# Patient Record
Sex: Female | Born: 1955
Health system: Southern US, Community
[De-identification: ages and names within clinical notes are randomized; demographics above are authoritative.]

## PROBLEM LIST (undated history)

## (undated) DIAGNOSIS — N19 Unspecified kidney failure: Secondary | ICD-10-CM

## (undated) DIAGNOSIS — D6859 Other primary thrombophilia: Secondary | ICD-10-CM

## (undated) DIAGNOSIS — N200 Calculus of kidney: Secondary | ICD-10-CM

## (undated) DIAGNOSIS — K5289 Other specified noninfective gastroenteritis and colitis: Secondary | ICD-10-CM

## (undated) DIAGNOSIS — F411 Generalized anxiety disorder: Secondary | ICD-10-CM

## (undated) DIAGNOSIS — K529 Noninfective gastroenteritis and colitis, unspecified: Secondary | ICD-10-CM

## (undated) DIAGNOSIS — L97909 Non-pressure chronic ulcer of unspecified part of unspecified lower leg with unspecified severity: Secondary | ICD-10-CM

## (undated) DIAGNOSIS — F17200 Nicotine dependence, unspecified, uncomplicated: Secondary | ICD-10-CM

## (undated) DIAGNOSIS — M1990 Unspecified osteoarthritis, unspecified site: Secondary | ICD-10-CM

## (undated) DIAGNOSIS — D759 Disease of blood and blood-forming organs, unspecified: Secondary | ICD-10-CM

## (undated) DIAGNOSIS — E785 Hyperlipidemia, unspecified: Secondary | ICD-10-CM

## (undated) DIAGNOSIS — F329 Major depressive disorder, single episode, unspecified: Secondary | ICD-10-CM

## (undated) DIAGNOSIS — Z8249 Family history of ischemic heart disease and other diseases of the circulatory system: Secondary | ICD-10-CM

## (undated) DIAGNOSIS — F32A Depression, unspecified: Secondary | ICD-10-CM

## (undated) DIAGNOSIS — R32 Unspecified urinary incontinence: Secondary | ICD-10-CM

## (undated) DIAGNOSIS — R071 Chest pain on breathing: Secondary | ICD-10-CM

## (undated) DIAGNOSIS — I82409 Acute embolism and thrombosis of unspecified deep veins of unspecified lower extremity: Secondary | ICD-10-CM

## (undated) DIAGNOSIS — I10 Essential (primary) hypertension: Secondary | ICD-10-CM

## (undated) DIAGNOSIS — I83009 Varicose veins of unspecified lower extremity with ulcer of unspecified site: Secondary | ICD-10-CM

## (undated) DIAGNOSIS — Z8719 Personal history of other diseases of the digestive system: Secondary | ICD-10-CM

## (undated) DIAGNOSIS — IMO0002 Reserved for concepts with insufficient information to code with codable children: Secondary | ICD-10-CM

## (undated) DIAGNOSIS — M75120 Complete rotator cuff tear or rupture of unspecified shoulder, not specified as traumatic: Secondary | ICD-10-CM

## (undated) DIAGNOSIS — J449 Chronic obstructive pulmonary disease, unspecified: Secondary | ICD-10-CM

## (undated) HISTORY — DX: Acute embolism and thrombosis of unspecified deep veins of unspecified lower extremity: I82.409

## (undated) HISTORY — DX: Chest pain on breathing: R07.1

## (undated) HISTORY — DX: Other specified noninfective gastroenteritis and colitis: K52.89

## (undated) HISTORY — DX: Essential (primary) hypertension: I10

## (undated) HISTORY — DX: Chronic obstructive pulmonary disease, unspecified: J44.9

## (undated) HISTORY — PX: NASAL SINUS SURGERY: SHX719

## (undated) HISTORY — DX: Major depressive disorder, single episode, unspecified: F32.9

## (undated) HISTORY — PX: OTHER SURGICAL HISTORY: SHX169

## (undated) HISTORY — DX: Varicose veins of unspecified lower extremity with ulcer of unspecified site: I83.009

## (undated) HISTORY — DX: Non-pressure chronic ulcer of unspecified part of unspecified lower leg with unspecified severity: L97.909

## (undated) HISTORY — DX: Reserved for concepts with insufficient information to code with codable children: IMO0002

## (undated) HISTORY — DX: Family history of ischemic heart disease and other diseases of the circulatory system: Z82.49

## (undated) HISTORY — DX: Unspecified urinary incontinence: R32

## (undated) HISTORY — DX: Personal history of other diseases of the digestive system: Z87.19

## (undated) HISTORY — DX: Complete rotator cuff tear or rupture of unspecified shoulder, not specified as traumatic: M75.120

## (undated) HISTORY — PX: TUBAL LIGATION: SHX77

## (undated) HISTORY — DX: Hyperlipidemia, unspecified: E78.5

## (undated) HISTORY — DX: Noninfective gastroenteritis and colitis, unspecified: K52.9

## (undated) HISTORY — PX: ROTATOR CUFF REPAIR: SHX139

## (undated) HISTORY — DX: Depression, unspecified: F32.A

## (undated) HISTORY — DX: Unspecified kidney failure: N19

## (undated) HISTORY — PX: THROMBECTOMY / EMBOLECTOMY FEMORAL ARTERY: SUR1353

## (undated) HISTORY — DX: Nicotine dependence, unspecified, uncomplicated: F17.200

## (undated) HISTORY — DX: Calculus of kidney: N20.0

## (undated) HISTORY — DX: Other primary thrombophilia: D68.59

## (undated) HISTORY — DX: Generalized anxiety disorder: F41.1

## (undated) HISTORY — DX: Unspecified osteoarthritis, unspecified site: M19.90

---

## 2003-09-14 ENCOUNTER — Encounter: Payer: Self-pay | Admitting: Family Medicine

## 2003-09-14 LAB — CONVERTED CEMR LAB: Pap Smear: NORMAL

## 2004-04-30 ENCOUNTER — Emergency Department (HOSPITAL_COMMUNITY): Admission: EM | Admit: 2004-04-30 | Discharge: 2004-05-02 | Payer: Self-pay | Admitting: Emergency Medicine

## 2004-09-03 ENCOUNTER — Ambulatory Visit (HOSPITAL_COMMUNITY): Admission: RE | Admit: 2004-09-03 | Discharge: 2004-09-03 | Payer: Self-pay | Admitting: Family Medicine

## 2005-12-08 ENCOUNTER — Emergency Department (HOSPITAL_COMMUNITY): Admission: EM | Admit: 2005-12-08 | Discharge: 2005-12-08 | Payer: Self-pay | Admitting: Emergency Medicine

## 2005-12-14 ENCOUNTER — Emergency Department (HOSPITAL_COMMUNITY): Admission: EM | Admit: 2005-12-14 | Discharge: 2005-12-14 | Payer: Self-pay | Admitting: Emergency Medicine

## 2006-05-20 ENCOUNTER — Emergency Department (HOSPITAL_COMMUNITY): Admission: EM | Admit: 2006-05-20 | Discharge: 2006-05-20 | Payer: Self-pay | Admitting: *Deleted

## 2006-05-20 ENCOUNTER — Emergency Department (HOSPITAL_COMMUNITY): Admission: EM | Admit: 2006-05-20 | Discharge: 2006-05-20 | Payer: Self-pay | Admitting: Emergency Medicine

## 2006-06-17 ENCOUNTER — Emergency Department (HOSPITAL_COMMUNITY): Admission: EM | Admit: 2006-06-17 | Discharge: 2006-06-17 | Payer: Self-pay | Admitting: Emergency Medicine

## 2006-11-06 ENCOUNTER — Emergency Department (HOSPITAL_COMMUNITY): Admission: EM | Admit: 2006-11-06 | Discharge: 2006-11-06 | Payer: Self-pay | Admitting: Emergency Medicine

## 2006-12-22 ENCOUNTER — Ambulatory Visit: Payer: Self-pay | Admitting: Family Medicine

## 2007-01-18 ENCOUNTER — Telehealth (INDEPENDENT_AMBULATORY_CARE_PROVIDER_SITE_OTHER): Payer: Self-pay | Admitting: *Deleted

## 2007-01-18 DIAGNOSIS — D6859 Other primary thrombophilia: Secondary | ICD-10-CM

## 2007-01-19 ENCOUNTER — Ambulatory Visit: Payer: Self-pay | Admitting: Oncology

## 2007-01-24 ENCOUNTER — Ambulatory Visit: Payer: Self-pay | Admitting: Family Medicine

## 2007-01-24 DIAGNOSIS — E7800 Pure hypercholesterolemia, unspecified: Secondary | ICD-10-CM

## 2007-01-25 LAB — CONVERTED CEMR LAB
ALT: 36 units/L (ref 0–40)
Alkaline Phosphatase: 69 units/L (ref 39–117)
BUN: 9 mg/dL (ref 6–23)
CO2: 31 meq/L (ref 19–32)
Calcium: 9.6 mg/dL (ref 8.4–10.5)
Chloride: 105 meq/L (ref 96–112)
Creatinine, Ser: 0.9 mg/dL (ref 0.4–1.2)
GFR calc non Af Amer: 70 mL/min
HDL: 41 mg/dL (ref >39.0)
Sodium: 142 meq/L (ref 135–145)
Total Bilirubin: 0.9 mg/dL (ref 0.3–1.2)
Total Protein: 7.1 g/dL (ref 6.0–8.3)
Triglycerides: 67 mg/dL (ref 0–149)
VLDL: 13 mg/dL (ref 0–40)

## 2007-02-02 LAB — CBC WITH DIFFERENTIAL (CANCER CENTER ONLY)
BASO#: 0.1 10*3/uL (ref 0.0–0.2)
BASO%: 0.6 % (ref 0.0–2.0)
Eosinophils Absolute: 0.2 10*3/uL (ref 0.0–0.5)
HCT: 42.1 % (ref 34.8–46.6)
HGB: 14.7 g/dL (ref 11.6–15.9)
LYMPH#: 2.8 10*3/uL (ref 0.9–3.3)
LYMPH%: 36.7 % (ref 14.0–48.0)
MCV: 91 fL (ref 81–101)
MONO#: 0.4 10*3/uL (ref 0.1–0.9)
NEUT%: 54.8 % (ref 39.6–80.0)
RBC: 4.61 10*6/uL (ref 3.70–5.32)
WBC: 7.6 10*3/uL (ref 3.9–10.0)

## 2007-02-02 LAB — PROTIME-INR (CHCC SATELLITE)

## 2007-02-08 ENCOUNTER — Encounter: Admission: RE | Admit: 2007-02-08 | Discharge: 2007-02-08 | Payer: Self-pay | Admitting: Family Medicine

## 2007-02-08 LAB — COMPREHENSIVE METABOLIC PANEL
ALT: 31 U/L (ref 0–35)
CO2: 28 mEq/L (ref 19–32)
Calcium: 9.4 mg/dL (ref 8.4–10.5)
Chloride: 102 mEq/L (ref 96–112)
Sodium: 139 mEq/L (ref 135–145)
Total Protein: 7 g/dL (ref 6.0–8.3)

## 2007-02-08 LAB — HYPERCOAGULABLE PANEL, COMPREHENSIVE
AntiThromb III Func: 92 % (ref 75–120)
Beta-2 Glyco I IgG: <4 U/mL (ref <20)
Beta-2-Glycoprotein I IgA: <4 U/mL (ref <10)
Beta-2-Glycoprotein I IgM: <4 U/mL (ref <10)
Homocysteine: 10.9 umol/L (ref 4.0–15.4)
Protein S Activity: 25 % — ABNORMAL LOW (ref 81–180)
Protein S Ag, Total: 68 % — ABNORMAL LOW (ref 70–140)

## 2007-03-01 ENCOUNTER — Encounter: Payer: Self-pay | Admitting: Family Medicine

## 2007-03-01 LAB — PROTIME-INR (CHCC SATELLITE)
INR: 1 — ABNORMAL LOW (ref 2.0–3.5)
Protime: 12 Seconds (ref 10.6–13.4)

## 2007-03-03 ENCOUNTER — Ambulatory Visit: Payer: Self-pay | Admitting: Oncology

## 2007-03-13 LAB — PROTIME-INR (CHCC SATELLITE): Protime: 39.6 Seconds — ABNORMAL HIGH (ref 10.6–13.4)

## 2007-03-21 LAB — PROTIME-INR (CHCC SATELLITE)
INR: 2.7 (ref 2.0–3.5)
Protime: 32.4 Seconds — ABNORMAL HIGH (ref 10.6–13.4)

## 2007-03-27 LAB — PROTIME-INR (CHCC SATELLITE): INR: 1.9 — ABNORMAL LOW (ref 2.0–3.5)

## 2007-04-03 LAB — PROTIME-INR (CHCC SATELLITE)

## 2007-05-03 ENCOUNTER — Ambulatory Visit: Payer: Self-pay | Admitting: Oncology

## 2007-05-04 ENCOUNTER — Telehealth (INDEPENDENT_AMBULATORY_CARE_PROVIDER_SITE_OTHER): Payer: Self-pay | Admitting: *Deleted

## 2007-05-04 LAB — PROTIME-INR (CHCC SATELLITE)

## 2007-05-30 ENCOUNTER — Telehealth: Payer: Self-pay | Admitting: Family Medicine

## 2007-05-30 DIAGNOSIS — F418 Other specified anxiety disorders: Secondary | ICD-10-CM

## 2007-05-31 LAB — CBC WITH DIFFERENTIAL (CANCER CENTER ONLY)
BASO%: 0.8 % (ref 0.0–2.0)
Eosinophils Absolute: 0.2 10*3/uL (ref 0.0–0.5)
LYMPH#: 2.6 10*3/uL (ref 0.9–3.3)
MCV: 94 fL (ref 81–101)
MONO#: 0.7 10*3/uL (ref 0.1–0.9)
Platelets: 237 10*3/uL (ref 145–400)
RBC: 4.89 10*6/uL (ref 3.70–5.32)
RDW: 12.2 % (ref 10.5–14.6)
WBC: 8.3 10*3/uL (ref 3.9–10.0)

## 2007-05-31 LAB — PROTIME-INR (CHCC SATELLITE): Protime: 12 Seconds (ref 10.6–13.4)

## 2007-06-01 ENCOUNTER — Telehealth (INDEPENDENT_AMBULATORY_CARE_PROVIDER_SITE_OTHER): Payer: Self-pay | Admitting: *Deleted

## 2007-06-14 ENCOUNTER — Telehealth: Payer: Self-pay | Admitting: Family Medicine

## 2007-06-14 LAB — PROTIME-INR (CHCC SATELLITE): Protime: 13.2 Seconds (ref 10.6–13.4)

## 2007-06-20 ENCOUNTER — Ambulatory Visit: Payer: Self-pay | Admitting: Oncology

## 2007-06-21 LAB — PROTIME-INR (CHCC SATELLITE)
INR: 2.2 (ref 2.0–3.5)
Protime: 26.4 Seconds — ABNORMAL HIGH (ref 10.6–13.4)

## 2007-06-26 ENCOUNTER — Ambulatory Visit: Payer: Self-pay | Admitting: Family Medicine

## 2007-06-26 DIAGNOSIS — I10 Essential (primary) hypertension: Secondary | ICD-10-CM

## 2007-06-26 DIAGNOSIS — K5289 Other specified noninfective gastroenteritis and colitis: Secondary | ICD-10-CM

## 2007-06-26 LAB — CONVERTED CEMR LAB
Bilirubin Urine: NEGATIVE
Glucose, Urine, Semiquant: NEGATIVE
Ketones, urine, test strip: NEGATIVE
Nitrite: NEGATIVE
Protein, U semiquant: NEGATIVE
Urobilinogen, UA: NEGATIVE
pH: 6

## 2007-06-29 ENCOUNTER — Encounter: Payer: Self-pay | Admitting: Family Medicine

## 2007-06-29 LAB — CBC WITH DIFFERENTIAL (CANCER CENTER ONLY)
BASO#: 0.1 10*3/uL (ref 0.0–0.2)
EOS%: 2.4 % (ref 0.0–7.0)
HCT: 45 % (ref 34.8–46.6)
HGB: 15.8 g/dL (ref 11.6–15.9)
LYMPH#: 3.9 10*3/uL — ABNORMAL HIGH (ref 0.9–3.3)
LYMPH%: 38.8 % (ref 14.0–48.0)
MCH: 33.3 pg (ref 26.0–34.0)
MCHC: 35.1 g/dL (ref 32.0–36.0)
MCV: 95 fL (ref 81–101)
NEUT%: 52.1 % (ref 39.6–80.0)

## 2007-06-29 LAB — PROTIME-INR (CHCC SATELLITE)
INR: 3.5 (ref 2.0–3.5)
Protime: 42 Seconds — ABNORMAL HIGH (ref 10.6–13.4)

## 2007-07-04 ENCOUNTER — Ambulatory Visit: Payer: Self-pay | Admitting: Family Medicine

## 2007-07-06 LAB — CONVERTED CEMR LAB
AST: 35 units/L (ref 0–37)
Albumin: 4.2 g/dL (ref 3.5–5.2)
Alkaline Phosphatase: 75 units/L (ref 39–117)
Bilirubin, Direct: 0.1 mg/dL (ref 0.0–0.3)
Chloride: 103 meq/L (ref 96–112)
GFR calc Af Amer: 98 mL/min
GFR calc non Af Amer: 81 mL/min
Hemoglobin: 15.2 g/dL — ABNORMAL HIGH (ref 12.0–15.0)
Neutrophils Relative %: 69.3 % (ref 43.0–77.0)
RBC: 4.47 M/uL (ref 3.87–5.11)
TSH: 1.56 microintl units/mL (ref 0.35–5.50)
Total Bilirubin: 0.9 mg/dL (ref 0.3–1.2)
Total Protein: 7.1 g/dL (ref 6.0–8.3)
WBC: 9.6 10*3/uL (ref 4.5–10.5)

## 2007-07-06 LAB — PROTIME-INR (CHCC SATELLITE): INR: 1.6 — ABNORMAL LOW (ref 2.0–3.5)

## 2007-07-13 LAB — PROTIME-INR (CHCC SATELLITE)

## 2007-07-17 ENCOUNTER — Telehealth: Payer: Self-pay | Admitting: Family Medicine

## 2007-08-09 ENCOUNTER — Ambulatory Visit: Payer: Self-pay | Admitting: Oncology

## 2007-08-16 LAB — PROTIME-INR (CHCC SATELLITE): Protime: 24 Seconds — ABNORMAL HIGH (ref 10.6–13.4)

## 2007-09-08 ENCOUNTER — Emergency Department: Payer: Self-pay | Admitting: Unknown Physician Specialty

## 2007-09-14 LAB — HM MAMMOGRAPHY: HM Mammogram: NORMAL

## 2007-11-26 ENCOUNTER — Emergency Department (HOSPITAL_COMMUNITY): Admission: EM | Admit: 2007-11-26 | Discharge: 2007-11-26 | Payer: Self-pay | Admitting: Emergency Medicine

## 2007-12-06 ENCOUNTER — Encounter: Admission: RE | Admit: 2007-12-06 | Discharge: 2007-12-06 | Payer: Self-pay | Admitting: Family Medicine

## 2007-12-06 ENCOUNTER — Ambulatory Visit: Payer: Self-pay | Admitting: Family Medicine

## 2007-12-06 DIAGNOSIS — R071 Chest pain on breathing: Secondary | ICD-10-CM

## 2007-12-27 ENCOUNTER — Ambulatory Visit: Payer: Self-pay | Admitting: Oncology

## 2007-12-29 ENCOUNTER — Encounter: Payer: Self-pay | Admitting: Family Medicine

## 2007-12-29 DIAGNOSIS — I82403 Acute embolism and thrombosis of unspecified deep veins of lower extremity, bilateral: Secondary | ICD-10-CM

## 2007-12-29 DIAGNOSIS — IMO0002 Reserved for concepts with insufficient information to code with codable children: Secondary | ICD-10-CM

## 2007-12-29 DIAGNOSIS — F17200 Nicotine dependence, unspecified, uncomplicated: Secondary | ICD-10-CM | POA: Insufficient documentation

## 2007-12-29 DIAGNOSIS — N200 Calculus of kidney: Secondary | ICD-10-CM

## 2007-12-29 DIAGNOSIS — R32 Unspecified urinary incontinence: Secondary | ICD-10-CM

## 2007-12-29 DIAGNOSIS — M75120 Complete rotator cuff tear or rupture of unspecified shoulder, not specified as traumatic: Secondary | ICD-10-CM

## 2008-01-12 ENCOUNTER — Telehealth: Payer: Self-pay | Admitting: Family Medicine

## 2008-01-23 ENCOUNTER — Telehealth (INDEPENDENT_AMBULATORY_CARE_PROVIDER_SITE_OTHER): Payer: Self-pay | Admitting: *Deleted

## 2008-01-24 ENCOUNTER — Encounter (INDEPENDENT_AMBULATORY_CARE_PROVIDER_SITE_OTHER): Payer: Self-pay | Admitting: *Deleted

## 2008-02-09 ENCOUNTER — Telehealth: Payer: Self-pay | Admitting: Family Medicine

## 2008-10-19 ENCOUNTER — Emergency Department (HOSPITAL_COMMUNITY): Admission: EM | Admit: 2008-10-19 | Discharge: 2008-10-20 | Payer: Self-pay | Admitting: Emergency Medicine

## 2009-02-04 ENCOUNTER — Emergency Department (HOSPITAL_COMMUNITY): Admission: EM | Admit: 2009-02-04 | Discharge: 2009-02-04 | Payer: Self-pay | Admitting: Emergency Medicine

## 2009-02-20 ENCOUNTER — Emergency Department (HOSPITAL_COMMUNITY): Admission: EM | Admit: 2009-02-20 | Discharge: 2009-02-20 | Payer: Self-pay | Admitting: Emergency Medicine

## 2010-01-11 ENCOUNTER — Emergency Department (HOSPITAL_COMMUNITY)
Admission: EM | Admit: 2010-01-11 | Discharge: 2010-01-11 | Payer: Self-pay | Source: Home / Self Care | Admitting: Emergency Medicine

## 2010-01-11 ENCOUNTER — Ambulatory Visit: Payer: Self-pay | Admitting: Vascular Surgery

## 2010-03-15 ENCOUNTER — Emergency Department (HOSPITAL_COMMUNITY)
Admission: EM | Admit: 2010-03-15 | Discharge: 2010-03-15 | Payer: Self-pay | Source: Home / Self Care | Admitting: Emergency Medicine

## 2010-03-15 ENCOUNTER — Encounter (INDEPENDENT_AMBULATORY_CARE_PROVIDER_SITE_OTHER): Payer: Self-pay | Admitting: Emergency Medicine

## 2010-03-15 ENCOUNTER — Ambulatory Visit: Payer: Self-pay | Admitting: Vascular Surgery

## 2010-03-19 ENCOUNTER — Encounter (HOSPITAL_BASED_OUTPATIENT_CLINIC_OR_DEPARTMENT_OTHER)
Admission: RE | Admit: 2010-03-19 | Discharge: 2010-06-17 | Payer: Self-pay | Source: Home / Self Care | Admitting: Internal Medicine

## 2010-04-09 ENCOUNTER — Ambulatory Visit: Payer: Self-pay | Admitting: Vascular Surgery

## 2010-04-27 ENCOUNTER — Ambulatory Visit: Payer: Self-pay | Admitting: Internal Medicine

## 2010-04-30 ENCOUNTER — Ambulatory Visit: Payer: Self-pay | Admitting: Cardiovascular Disease

## 2010-04-30 LAB — CONVERTED CEMR LAB: POC INR: 1.5

## 2010-05-11 ENCOUNTER — Ambulatory Visit: Payer: Self-pay | Admitting: Internal Medicine

## 2010-05-26 ENCOUNTER — Ambulatory Visit: Payer: Self-pay | Admitting: Cardiovascular Disease

## 2010-05-26 LAB — CONVERTED CEMR LAB

## 2010-06-10 ENCOUNTER — Ambulatory Visit: Payer: Self-pay | Admitting: Internal Medicine

## 2010-06-10 LAB — CONVERTED CEMR LAB: POC INR: 2.5

## 2010-06-17 ENCOUNTER — Encounter: Payer: Self-pay | Admitting: Internal Medicine

## 2010-06-17 ENCOUNTER — Encounter (HOSPITAL_BASED_OUTPATIENT_CLINIC_OR_DEPARTMENT_OTHER)
Admission: RE | Admit: 2010-06-17 | Discharge: 2010-09-15 | Payer: Self-pay | Source: Home / Self Care | Attending: Internal Medicine | Admitting: Internal Medicine

## 2010-06-22 ENCOUNTER — Telehealth (INDEPENDENT_AMBULATORY_CARE_PROVIDER_SITE_OTHER): Payer: Self-pay | Admitting: *Deleted

## 2010-06-25 ENCOUNTER — Ambulatory Visit: Payer: Self-pay | Admitting: Internal Medicine

## 2010-06-25 LAB — CONVERTED CEMR LAB: POC INR: 2.9

## 2010-07-15 ENCOUNTER — Telehealth (INDEPENDENT_AMBULATORY_CARE_PROVIDER_SITE_OTHER): Payer: Self-pay | Admitting: *Deleted

## 2010-09-16 ENCOUNTER — Encounter (HOSPITAL_BASED_OUTPATIENT_CLINIC_OR_DEPARTMENT_OTHER)
Admission: RE | Admit: 2010-09-16 | Discharge: 2010-10-13 | Payer: Self-pay | Source: Home / Self Care | Attending: Internal Medicine | Admitting: Internal Medicine

## 2010-09-24 ENCOUNTER — Ambulatory Visit
Admission: RE | Admit: 2010-09-24 | Discharge: 2010-09-24 | Payer: Self-pay | Source: Home / Self Care | Attending: Internal Medicine | Admitting: Internal Medicine

## 2010-09-24 ENCOUNTER — Other Ambulatory Visit: Payer: Self-pay | Admitting: Internal Medicine

## 2010-09-24 DIAGNOSIS — L723 Sebaceous cyst: Secondary | ICD-10-CM | POA: Insufficient documentation

## 2010-09-24 DIAGNOSIS — M542 Cervicalgia: Secondary | ICD-10-CM | POA: Insufficient documentation

## 2010-09-24 LAB — CBC WITH DIFFERENTIAL/PLATELET
Basophils Absolute: 0.1 10*3/uL (ref 0.0–0.1)
Basophils Relative: 1 % (ref 0.0–3.0)
Eosinophils Absolute: 0.3 10*3/uL (ref 0.0–0.7)
Eosinophils Relative: 3.1 % (ref 0.0–5.0)
HCT: 44.8 % (ref 36.0–46.0)
Hemoglobin: 15.6 g/dL — ABNORMAL HIGH (ref 12.0–15.0)
Lymphocytes Relative: 34.5 % (ref 12.0–46.0)
Lymphs Abs: 3.5 10*3/uL (ref 0.7–4.0)
MCHC: 34.8 g/dL (ref 30.0–36.0)
MCV: 94.2 fl (ref 78.0–100.0)
Monocytes Absolute: 0.1 10*3/uL (ref 0.1–1.0)
Monocytes Relative: 0.8 % — ABNORMAL LOW (ref 3.0–12.0)
Neutro Abs: 6.2 10*3/uL (ref 1.4–7.7)
Neutrophils Relative %: 60.6 % (ref 43.0–77.0)
Platelets: 236 10*3/uL (ref 150.0–400.0)
RBC: 4.76 Mil/uL (ref 3.87–5.11)
RDW: 12.7 % (ref 11.5–14.6)
WBC: 10.2 10*3/uL (ref 4.5–10.5)

## 2010-09-24 LAB — BASIC METABOLIC PANEL
BUN: 10 mg/dL (ref 6–23)
CO2: 27 mEq/L (ref 19–32)
Calcium: 8.9 mg/dL (ref 8.4–10.5)
Chloride: 103 mEq/L (ref 96–112)
Creatinine, Ser: 0.7 mg/dL (ref 0.4–1.2)
GFR: 92.64 mL/min (ref >60.00)
Glucose, Bld: 94 mg/dL (ref 70–99)
Potassium: 4.1 mEq/L (ref 3.5–5.1)
Sodium: 140 mEq/L (ref 135–145)

## 2010-09-24 LAB — PROTIME-INR
INR: 1 ratio (ref 0.8–1.0)
Prothrombin Time: 11 s (ref 10.2–12.4)

## 2010-09-25 ENCOUNTER — Ambulatory Visit: Admit: 2010-09-25 | Payer: Self-pay

## 2010-09-25 ENCOUNTER — Telehealth: Payer: Self-pay | Admitting: Internal Medicine

## 2010-09-25 ENCOUNTER — Encounter: Payer: Self-pay | Admitting: Internal Medicine

## 2010-09-29 ENCOUNTER — Telehealth (INDEPENDENT_AMBULATORY_CARE_PROVIDER_SITE_OTHER): Payer: Self-pay | Admitting: *Deleted

## 2010-09-30 ENCOUNTER — Telehealth (INDEPENDENT_AMBULATORY_CARE_PROVIDER_SITE_OTHER): Payer: Self-pay | Admitting: *Deleted

## 2010-10-01 ENCOUNTER — Telehealth: Payer: Self-pay | Admitting: Internal Medicine

## 2010-10-01 ENCOUNTER — Ambulatory Visit
Admission: RE | Admit: 2010-10-01 | Discharge: 2010-10-01 | Payer: Self-pay | Source: Home / Self Care | Attending: Internal Medicine | Admitting: Internal Medicine

## 2010-10-01 ENCOUNTER — Other Ambulatory Visit: Payer: Self-pay | Admitting: Internal Medicine

## 2010-10-01 DIAGNOSIS — M549 Dorsalgia, unspecified: Secondary | ICD-10-CM | POA: Insufficient documentation

## 2010-10-01 DIAGNOSIS — N390 Urinary tract infection, site not specified: Secondary | ICD-10-CM | POA: Insufficient documentation

## 2010-10-01 LAB — URINALYSIS, ROUTINE W REFLEX MICROSCOPIC
Hemoglobin, Urine: NEGATIVE
Leukocytes, UA: NEGATIVE
Nitrite: NEGATIVE
Specific Gravity, Urine: >=1.03 (ref 1.000–1.030)
Urine Glucose: NEGATIVE
Urobilinogen, UA: 1 (ref 0.0–1.0)
pH: 5.5 (ref 5.0–8.0)

## 2010-10-02 ENCOUNTER — Encounter: Payer: Self-pay | Admitting: Internal Medicine

## 2010-10-04 ENCOUNTER — Encounter: Payer: Self-pay | Admitting: Family Medicine

## 2010-10-05 ENCOUNTER — Encounter (INDEPENDENT_AMBULATORY_CARE_PROVIDER_SITE_OTHER): Payer: Self-pay | Admitting: Pharmacist

## 2010-10-08 ENCOUNTER — Ambulatory Visit: Payer: Self-pay | Admitting: Cardiology

## 2010-10-12 ENCOUNTER — Telehealth: Payer: Self-pay | Admitting: Internal Medicine

## 2010-10-13 NOTE — Medication Information (Signed)
Summary: ccr  Anticoagulant Therapy  Managed by: Cloyde Reams, RN, BSN Referring MD: Dr. Yetta Barre PCP: Etta Grandchild MD Supervising MD: Clifton James MD, Cristal Deer Indication 1: DVT Lab Used: [facility] [place] Site: [address] INR POC 1.2 INR RANGE 2.0-3.0  Dietary changes: no    Health status changes: no    Bleeding/hemorrhagic complications: no    Recent/future hospitalizations: no    Any changes in medication regimen? no    Recent/future dental: no  Any missed doses?: no       Is patient compliant with meds? yes       Allergies: 1)  ! * Naproxen 2)  ! Fioricet (Butalbital-Apap-Caffeine)  Anticoagulation Management History:      The patient is taking warfarin and comes in today for a routine follow up visit.  Negative risk factors for bleeding include an age less than 64 years old.  The bleeding index is 'low risk'.  Positive CHADS2 values include History of HTN.  Negative CHADS2 values include Age > 36 years old.  Anticoagulation responsible provider: Clifton James MD, Cristal Deer.  INR POC: 1.2.  Cuvette Lot#: [id number].  Exp: 07/2011.    Anticoagulation Management Assessment/Plan:      The patient's current anticoagulation dose is Coumadin 7.5 mg tabs: One by mouth once daily.  The target INR is 2.0-3.0.  The next INR is due 06/04/2010.  Results were reviewed/authorized by Cloyde Reams, RN, BSN.  She was notified by Cloyde Reams, RN, BSN.         Prior Anticoagulation Instructions: INR 1.5 Coumadin 7.5mg  each day  Current Anticoagulation Instructions: INR 1.2  Take 1 additional tablet today, then start taking 1 tablet daily except 1.5 tablets on Wednesdays and Saturdays.  Recheck in 1 week.

## 2010-10-13 NOTE — Letter (Signed)
Summary: [facility] Referral (unable to schedule) Notification  [facility]  [address] [place], [place] [address]   Phone: [phone]  Fax: [phone]      June 17, 2010 Brooke Garcia [facility] 1956-07-05 MRN: [id number]   [facility] [address] [place], [place]  [address]   Dear Dr. Yetta Barre:   Thank you for your kind referral of the above patient. We have attempted to schedule the recommended Colonoscopy but have been unable to schedule because:  __ The patient was not available by phone and/or has not returned our calls.  _x_ The patient declined to schedule the procedure at this time. Does not have insurance at this time.  We appreciate the referral and hope that we will have the opportunity to treat this patient in the future.    Sincerely,   [facility]  Vania Rea. Jarold Motto M.D. Hedwig Morton. Juanda Chance M.D. Venita Lick. Russella Dar M.D. Wilhemina Bonito. Marina Goodell M.D. Barbette Hair. Arlyce Dice M.D. Iva Boop M.D. Cheron Every.D.

## 2010-10-13 NOTE — Medication Information (Signed)
Summary: rov/mw  Anticoagulant Therapy  Managed by: Weston Brass, PharmD Referring MD: Dr. Yetta Barre PCP: Etta Grandchild MD Supervising MD: Ladona Ridgel MD,Laurice Iglesia Indication 1: DVT Lab Used: [facility] [place] Site: [address] INR POC 2.9 INR RANGE 2.0-3.0  Dietary changes: no    Health status changes: no    Bleeding/hemorrhagic complications: no    Recent/future hospitalizations: no    Any changes in medication regimen? no    Recent/future dental: no  Any missed doses?: yes     Details: Missed dose on 06/20/10.    Is patient compliant with meds? yes       Allergies: 1)  ! * Naproxen 2)  ! Fioricet (Butalbital-Apap-Caffeine)  Anticoagulation Management History:      The patient is taking warfarin and comes in today for a routine follow up visit.  Negative risk factors for bleeding include an age less than 2 years old.  The bleeding index is 'low risk'.  Positive CHADS2 values include History of HTN.  Negative CHADS2 values include Age > 20 years old.  Anticoagulation responsible provider: Ladona Ridgel MD,Nolton Denis.  INR POC: 2.9.  Cuvette Lot#: [id number].  Exp: 07/2011.    Anticoagulation Management Assessment/Plan:      The patient's current anticoagulation dose is Coumadin 7.5 mg tabs: One by mouth once daily.  The target INR is 2.0-3.0.  The next INR is due 07/23/2010.  Results were reviewed/authorized by Weston Brass, PharmD.  She was notified by Haynes Hoehn, PharmD Candidate.         Prior Anticoagulation Instructions: INR 2.5  Continue taking 1.5 tablets on wednesday and saturday. And 1 tablet all other days. Recheck 1 week.  Current Anticoagulation Instructions: INR 2.9  Continue Coumadin as scheduled:  1 tablet every day of the week, except 1 and 1/2 tablets on Wednesday and Saturday.    Return to clinic in 4 weeks.

## 2010-10-13 NOTE — Medication Information (Signed)
Summary: rov/ewj  Anticoagulant Therapy  Managed by: Lyna Poser, PharmD Referring MD: Dr. Yetta Barre PCP: Etta Grandchild MD Supervising MD: Ladona Ridgel MD,Gregg Indication 1: DVT Lab Used: [facility] [place] Site: [address] INR POC 2.5 INR RANGE 2.0-3.0  Dietary changes: no    Health status changes: no    Bleeding/hemorrhagic complications: no    Recent/future hospitalizations: no    Any changes in medication regimen? no    Recent/future dental: no  Any missed doses?: no       Is patient compliant with meds? yes       Allergies: 1)  ! * Naproxen 2)  ! Fioricet (Butalbital-Apap-Caffeine)  Anticoagulation Management History:      The patient is taking warfarin and comes in today for a routine follow up visit.  Negative risk factors for bleeding include an age less than 76 years old.  The bleeding index is 'low risk'.  Positive CHADS2 values include History of HTN.  Negative CHADS2 values include Age > 31 years old.  Anticoagulation responsible Rateel Beldin: Ladona Ridgel MD,Gregg.  INR POC: 2.5.  Cuvette Lot#: [id number].  Exp: 07/2011.    Anticoagulation Management Assessment/Plan:      The patient's current anticoagulation dose is Coumadin 7.5 mg tabs: One by mouth once daily.  The target INR is 2.0-3.0.  The next INR is due 06/18/2010.  Results were reviewed/authorized by Lyna Poser, PharmD.         Prior Anticoagulation Instructions: INR 1.2  Take 1 additional tablet today, then start taking 1 tablet daily except 1.5 tablets on Wednesdays and Saturdays.  Recheck in 1 week.  Current Anticoagulation Instructions: INR 2.5  Continue taking 1.5 tablets on wednesday and saturday. And 1 tablet all other days. Recheck 1 week.

## 2010-10-13 NOTE — Progress Notes (Signed)
  Phone Note Other Incoming   Request: Send information Summary of Call: Request for records received from [facility]. Request forwarded to [employer].      Appended Document:  [facility] request recieved sent to [employer]   Appended Document:  [facility] Request received sent to [facility]

## 2010-10-13 NOTE — Assessment & Plan Note (Signed)
Summary: 2 week follow up-lb   Vital Signs:  Patient profile:   55 year old female Height:      63.5 inches Weight:      182 pounds BMI:     31.85 O2 Sat:      96 % on Room air Temp:     98.0 degrees F oral Pulse rate:   80 / minute Pulse rhythm:   regular Resp:     16 per minute BP sitting:   122 / 80  (left arm) Cuff size:   regular  Vitals Entered By: Bill Salinas CMA (May 11, 2010 2:54 PM)  Nutrition Counseling: Patient's BMI is greater than 25 and therefore counseled on weight management options.  O2 Flow:  Room air CC: follow-up visit/ ab   Primary Care Tawnia Schirm:  Etta Grandchild MD  CC:  follow-up visit/ ab.  History of Present Illness: She returns for f/up and is doing well on coumadin and she continues to go to the [facility] 2x per week and she says that her ulcers are not infected anymore.  Preventive Screening-Counseling & Management  Alcohol-Tobacco     Alcohol drinks/day: 0     Smoking Status: current     Smoking Cessation Counseling: yes     Smoke Cessation Stage: contemplative     Packs/Day: 1.0     Year Started: 1975     Pack years: 45     Tobacco Counseling: to quit use of tobacco products  Hep-HIV-STD-Contraception     Hepatitis Risk: no risk noted     HIV Risk: no risk noted     STD Risk: no risk noted      Drug Use:  never.        Blood Transfusions:  no.    Medications Prior to Update: 1)  Amlodipine Besylate 10 Mg  Tabs (Amlodipine Besylate) .... Take 1 Tablet By Mouth Once A Day 2)  Multivitamins   Tabs (Multiple Vitamin) .... Take 1 Tablet By Mouth Once A Day 3)  Alprazolam 1 Mg  Tbdp (Alprazolam) .Marland Kitchen.. 1 Tab By Mouth Tid 4)  Doxycycline Hyclate 100 Mg Tabs (Doxycycline Hyclate) .... Take 1 Tablet By Mouth Two Times A Day 5)  Vicodin 5-500 Mg Tabs (Hydrocodone-Acetaminophen) .... Every 4-6 Hours As Needed 6)  Coumadin 7.5 Mg Tabs (Warfarin Sodium) .... One By Mouth Once Daily  Current Medications (verified): 1)  Amlodipine Besylate 10  Mg  Tabs (Amlodipine Besylate) .... Take 1 Tablet By Mouth Once A Day 2)  Multivitamins   Tabs (Multiple Vitamin) .... Take 1 Tablet By Mouth Once A Day 3)  Alprazolam 1 Mg  Tbdp (Alprazolam) .Marland Kitchen.. 1 Tab By Mouth Tid 4)  Vicodin 5-500 Mg Tabs (Hydrocodone-Acetaminophen) .... Every 4-6 Hours As Needed 5)  Coumadin 7.5 Mg Tabs (Warfarin Sodium) .... One By Mouth Once Daily  Allergies (verified): 1)  ! * Naproxen 2)  ! Fioricet (Butalbital-Apap-Caffeine)  Past History:  Past Medical History: Last updated: 12/29/2007 TOBACCO ABUSE (ICD-305.1) URINARY INCONTINENCE (ICD-788.30) ROTATOR CUFF TEAR (ICD-727.61) DEEP VENOUS THROMBOPHLEBITIS (ICD-453.40) RENAL CALCULUS (ICD-592.0) DEGENERATIVE DISC DISEASE (ICD-722.6) CHEST PAIN, PLEURITIC (ICD-786.52) GASTROENTERITIS (ICD-558.9) HYPERTENSION, BENIGN ESSENTIAL (ICD-401.1) FAMILY HISTORY OF CAD FEMALE 1ST DEGREE RELATIVE <50 (ICD-V17.3) ANXIETY STATE NOS (ICD-300.00) HYPERCHOLESTEROLEMIA, PURE (ICD-272.0) HYPERCOAGULABLE STATE, PRIMARY (ICD-289.81)    Past Surgical History: Last updated: 12/29/2007 2006    DVT, femoral artery 1980's Sinus surgery 1992    BTL, reversal  Family History: Last updated: 12/29/2007 Family History of CAD Female  1st degree relative <50 Father: Died 33, Lung CA, MI (CABG), DM, Protein S Deficiency Mother: Died 50 of breast CA Siblings: 1 brother and 1 sister, healthy HBP:  MGM (+) Mi DM:  (+) Breast CA:  Mom  Social History: Last updated: 12/29/2007 Current Smoker, 1/2 PPD Alcohol use-yes, wine occasionally Drug use-no Marital Status: Married Children:  Occupation: Sales  Risk Factors: Alcohol Use: 0 (05/11/2010)  Risk Factors: Smoking Status: current (05/11/2010) Packs/Day: 1.0 (05/11/2010)  Family History: Reviewed history from 12/29/2007 and no changes required. Family History of CAD Female 1st degree relative <50 Father: Died 31, Lung CA, MI (CABG), DM, Protein S Deficiency Mother: Died 33 of  breast CA Siblings: 1 brother and 1 sister, healthy HBP:  MGM (+) Mi DM:  (+) Breast CA:  Mom  Social History: Reviewed history from 12/29/2007 and no changes required. Current Smoker, 1/2 PPD Alcohol use-yes, wine occasionally Drug use-no Marital Status: Married Children:  Occupation: Sales  Review of Systems       The patient complains of weight gain.  The patient denies anorexia, fever, chest pain, syncope, dyspnea on exertion, peripheral edema, prolonged cough, headaches, hemoptysis, abdominal pain, hematuria, suspicious skin lesions, transient blindness, abnormal bleeding, angioedema, and breast masses.   Psych:  Complains of irritability; denies alternate hallucination ( auditory/visual), anxiety, depression, easily angered, easily tearful, mental problems, panic attacks, suicidal thoughts/plans, thoughts of violence, unusual visions or sounds, and thoughts /plans of harming others.  Physical Exam  General:  Well-developed,well-nourished,in no acute distress; alert,appropriate and cooperative throughout examination Mouth:  Oral mucosa and oropharynx without lesions or exudates.  Teeth in good repair. Neck:  supple, full ROM, and no masses.   Lungs:  Normal respiratory effort, chest expands symmetrically. Lungs are clear to auscultation, no crackles or wheezes. Heart:  Normal rate and regular rhythm. S1 and S2 normal without gallop, murmur, click, rub or other extra sounds. Abdomen:  soft, non-tender, normal bowel sounds, no distention, no masses, no guarding, no rigidity, no rebound tenderness, no abdominal hernia, no inguinal hernia, no hepatomegaly, and no splenomegaly.   Msk:  normal ROM, no joint tenderness, no joint swelling, no joint warmth, no redness over joints, no joint deformities, no joint instability, and no crepitation.   Pulses:  R and L carotid,radial,femoral,dorsalis pedis and posterior tibial pulses are full and equal bilaterally Extremities:  she has multiple  round deep ulcers on her posterior LLE with a base of granulation tissue and some purulent exudate. some edges are erythematous but there is no streaking, induration, or fluctuance. Neurologic:  No cranial nerve deficits noted. Station and gait are normal. Plantar reflexes are down-going bilaterally. DTRs are symmetrical throughout. Sensory, motor and coordinative functions appear intact. Skin:  turgor normal, color normal, no rashes, no suspicious lesions, no ecchymoses, and no petechiae.   Cervical Nodes:  no anterior cervical adenopathy and no posterior cervical adenopathy.   Psych:  Cognition and judgment appear intact. Alert and cooperative with normal attention span and concentration. No apparent delusions, illusions, hallucinations   Impression & Recommendations:  Problem # 1:  DEEP VENOUS THROMBOPHLEBITIS (ICD-453.40) Assessment Improved  Problem # 2:  TOBACCO ABUSE (ICD-305.1) Assessment: Unchanged  Problem # 3:  HYPERTENSION, BENIGN ESSENTIAL (ICD-401.1) Assessment: Improved  Her updated medication list for this problem includes:    Amlodipine Besylate 10 Mg Tabs (Amlodipine besylate) .Marland Kitchen... Take 1 tablet by mouth once a day  BP today: 122/80 Prior BP: 142/82 (04/27/2010)  Labs Reviewed: K+: 4.0 (07/04/2007) Creat: : 0.8 (  07/04/2007)   Chol: 188 (07/04/2007)   HDL: 66.6 (07/04/2007)   LDL: 111 (07/04/2007)   TG: 52 (07/04/2007)  Problem # 4:  ANXIETY STATE NOS (ICD-300.00) Assessment: Improved  Her updated medication list for this problem includes:    Alprazolam 1 Mg Tbdp (Alprazolam) .Marland Kitchen... 1 tab by mouth tid  Complete Medication List: 1)  Amlodipine Besylate 10 Mg Tabs (Amlodipine besylate) .... Take 1 tablet by mouth once a day 2)  Multivitamins Tabs (Multiple vitamin) .... Take 1 tablet by mouth once a day 3)  Alprazolam 1 Mg Tbdp (Alprazolam) .Marland Kitchen.. 1 tab by mouth tid 4)  Vicodin 5-500 Mg Tabs (Hydrocodone-acetaminophen) .... Every 4-6 hours as needed 5)  Coumadin  7.5 Mg Tabs (Warfarin sodium) .... One by mouth once daily  Other Orders: Radiology Referral (Radiology) Gastroenterology Referral (GI)  Patient Instructions: 1)  Please schedule a follow-up appointment in 2 months. 2)  Tobacco is very bad for your health and your loved ones! You Should stop smoking!. 3)  Stop Smoking Tips: Choose a Quit date. Cut down before the Quit date. decide what you will do as a substitute when you feel the urge to smoke(gum,toothpick,exercise). 4)  It is important that you exercise regularly at least 20 minutes 5 times a week. If you develop chest pain, have severe difficulty breathing, or feel very tired , stop exercising immediately and seek medical attention. 5)  You need to lose weight. Consider a lower calorie diet and regular exercise.  6)  Check your Blood Pressure regularly. If it is above 140/90: you should make an appointment.   Preventive Care Screening  Colonoscopy:    Date:  05/11/2010    Results:  Pt Never had  Mammogram:    Date:  09/14/2007    Results:  normal   Pap Smear:    Date:  09/14/2007    Results:  normal     Immunization History:  Influenza Immunization History:    Influenza:  declined (05/11/2010)

## 2010-10-13 NOTE — Assessment & Plan Note (Signed)
Summary: [facility] PT--PKG--#  -STC   Vital Signs:  Patient profile:   55 year old female Height:      63.5 inches Weight:      182.50 pounds BMI:     31.94 O2 Sat:      96 % on Room air Temp:     97.9 degrees F oral Pulse rate:   76 / minute Pulse rhythm:   regular Resp:     16 per minute BP sitting:   142 / 82  (left arm) Cuff size:   large  Vitals Entered By: Rock Nephew CMA (April 27, 2010 2:50 PM)  Nutrition Counseling: Patient's BMI is greater than 25 and therefore counseled on weight management options.  O2 Flow:  Room air  Primary Care Provider:  Etta Grandchild MD   History of Present Illness: New to me she has a hx. of DVT's and has seen Dr. Dolores Frame a Hematologist in [place] and was found to have Protein S deficiency, she has been out of Coumadin for a year and is being treated at the wound clinic for ulcers on her LLE and needs to restart Coumadin.  Preventive Screening-Counseling & Management  Alcohol-Tobacco     Alcohol drinks/day: 0     Smoking Status: current     Smoking Cessation Counseling: yes     Smoke Cessation Stage: contemplative     Packs/Day: 1.0     Year Started: 1975     Pack years: 68     Tobacco Counseling: to quit use of tobacco products  Hep-HIV-STD-Contraception     Hepatitis Risk: no risk noted     HIV Risk: no risk noted     STD Risk: no risk noted      Drug Use:  never.        Blood Transfusions:  no.    Medications Prior to Update: 1)  Coumadin 4 Mg  Tabs (Warfarin Sodium) .... ? Mg.  Once Daily 2)  Lithium Carbonate 150 Mg  Caps (Lithium Carbonate) .... Take 1 Tablet By Mouth Two Times A Day 3)  Amlodipine Besylate 10 Mg  Tabs (Amlodipine Besylate) .... Take 1 Tablet By Mouth Once A Day 4)  Multivitamins   Tabs (Multiple Vitamin) .... Take 1 Tablet By Mouth Once A Day 5)  Percocet 5-325 Mg  Tabs (Oxycodone-Acetaminophen) .Marland Kitchen.. 1 Tab By Mouth Q 6 Hours As Needed Pain 6)  Avelox 400 Mg  Tabs (Moxifloxacin Hcl) .Marland Kitchen.. 1 Tab  By Mouth Daily X 14 7)  Alprazolam 1 Mg  Tbdp (Alprazolam) .Marland Kitchen.. 1 Tab By Mouth Qid  Current Medications (verified): 1)  Amlodipine Besylate 10 Mg  Tabs (Amlodipine Besylate) .... Take 1 Tablet By Mouth Once A Day 2)  Multivitamins   Tabs (Multiple Vitamin) .... Take 1 Tablet By Mouth Once A Day 3)  Alprazolam 1 Mg  Tbdp (Alprazolam) .Marland Kitchen.. 1 Tab By Mouth Tid 4)  Doxycycline Hyclate 100 Mg Tabs (Doxycycline Hyclate) .... Take 1 Tablet By Mouth Two Times A Day 5)  Vicodin 5-500 Mg Tabs (Hydrocodone-Acetaminophen) .... Every 4-6 Hours As Needed 6)  Coumadin 7.5 Mg Tabs (Warfarin Sodium) .... One By Mouth Once Daily  Allergies (verified): 1)  ! * Naproxen 2)  ! Fioricet (Butalbital-Apap-Caffeine)  Past History:  Past Medical History: Last updated: 12/29/2007 TOBACCO ABUSE (ICD-305.1) URINARY INCONTINENCE (ICD-788.30) ROTATOR CUFF TEAR (ICD-727.61) DEEP VENOUS THROMBOPHLEBITIS (ICD-453.40) RENAL CALCULUS (ICD-592.0) DEGENERATIVE DISC DISEASE (ICD-722.6) CHEST PAIN, PLEURITIC (ICD-786.52) GASTROENTERITIS (ICD-558.9) HYPERTENSION, BENIGN ESSENTIAL (ICD-401.1)  FAMILY HISTORY OF CAD FEMALE 1ST DEGREE RELATIVE <50 (ICD-V17.3) ANXIETY STATE NOS (ICD-300.00) HYPERCHOLESTEROLEMIA, PURE (ICD-272.0) HYPERCOAGULABLE STATE, PRIMARY (ICD-289.81)    Past Surgical History: Last updated: 12/29/2007 2006    DVT, femoral artery 1980's Sinus surgery 1992    BTL, reversal  Family History: Last updated: 12/29/2007 Family History of CAD Female 1st degree relative <50 Father: Died 62, Lung CA, MI (CABG), DM, Protein S Deficiency Mother: Died 56 of breast CA Siblings: 1 brother and 1 sister, healthy HBP:  MGM (+) Mi DM:  (+) Breast CA:  Mom  Social History: Last updated: 12/29/2007 Current Smoker, 1/2 PPD Alcohol use-yes, wine occasionally Drug use-no Marital Status: Married Children:  Occupation: Sales  Risk Factors: Alcohol Use: 0 (04/27/2010)  Risk Factors: Smoking Status: current  (04/27/2010) Packs/Day: 1.0 (04/27/2010)  Family History: Reviewed history from 12/29/2007 and no changes required. Family History of CAD Female 1st degree relative <50 Father: Died 61, Lung CA, MI (CABG), DM, Protein S Deficiency Mother: Died 81 of breast CA Siblings: 1 brother and 1 sister, healthy HBP:  MGM (+) Mi DM:  (+) Breast CA:  Mom  Social History: Reviewed history from 12/29/2007 and no changes required. Current Smoker, 1/2 PPD Alcohol use-yes, wine occasionally Drug use-no Marital Status: Married Children:  Occupation: Airline pilot Packs/Day:  1.0 Hepatitis Risk:  no risk noted STD Risk:  no risk noted HIV Risk:  no risk noted Drug Use:  never Blood Transfusions:  no  Review of Systems  The patient denies anorexia, fever, weight loss, weight gain, chest pain, dyspnea on exertion, peripheral edema, prolonged cough, headaches, hemoptysis, abdominal pain, melena, hematochezia, hematuria, suspicious skin lesions, abnormal bleeding, and angioedema.   Resp:  Denies chest discomfort, chest pain with inspiration, cough, coughing up blood, pleuritic, shortness of breath, sputum productive, and wheezing. Psych:  Complains of anxiety, irritability, and panic attacks; denies alternate hallucination ( auditory/visual), depression, easily angered, easily tearful, mental problems, sense of great danger, suicidal thoughts/plans, thoughts of violence, unusual visions or sounds, and thoughts /plans of harming others.  Physical Exam  General:  Well-developed,well-nourished,in no acute distress; alert,appropriate and cooperative throughout examination Head:  normocephalic, atraumatic, no abnormalities observed, and no abnormalities palpated.   Eyes:  vision grossly intact, pupils equal, pupils round, and pupils reactive to light.   Ears:  R ear normal and L ear normal.   Mouth:  Oral mucosa and oropharynx without lesions or exudates.  Teeth in good repair. Neck:  supple, full ROM, and no  masses.   Lungs:  Normal respiratory effort, chest expands symmetrically. Lungs are clear to auscultation, no crackles or wheezes. Heart:  Normal rate and regular rhythm. S1 and S2 normal without gallop, murmur, click, rub or other extra sounds. Abdomen:  soft, non-tender, normal bowel sounds, no distention, no masses, no guarding, no rigidity, no rebound tenderness, no abdominal hernia, no inguinal hernia, no hepatomegaly, and no splenomegaly.   Msk:  normal ROM, no joint tenderness, no joint swelling, no joint warmth, no redness over joints, no joint deformities, no joint instability, and no crepitation.   Pulses:  R and L carotid,radial,femoral,dorsalis pedis and posterior tibial pulses are full and equal bilaterally Extremities:  she has multiple round deep ulcers on her posterior LLE with a base of granulation tissue and some purulent exudate. some edges are erythematous but there is no streaking, induration, or fluctuance. Neurologic:  No cranial nerve deficits noted. Station and gait are normal. Plantar reflexes are down-going bilaterally. DTRs are symmetrical throughout. Sensory,  motor and coordinative functions appear intact. Skin:  turgor normal, color normal, no rashes, no suspicious lesions, no ecchymoses, and no petechiae.   Cervical Nodes:  no anterior cervical adenopathy and no posterior cervical adenopathy.   Axillary Nodes:  no R axillary adenopathy and no L axillary adenopathy.   Inguinal Nodes:  no R inguinal adenopathy and no L inguinal adenopathy.   Psych:  Cognition and judgment appear intact. Alert and cooperative with normal attention span and concentration. No apparent delusions, illusions, hallucinations   Impression & Recommendations:  Problem # 1:  TOBACCO ABUSE (ICD-305.1) Assessment Improved  Encouraged smoking cessation and discussed different methods for smoking cessation.   Orders: Tobacco use cessation intermediate 3-10 minutes (99406)  Problem # 2:  DEEP  VENOUS THROMBOPHLEBITIS (ICD-453.40) Assessment: Unchanged  Orders: [facility] Referral (Coumadin clinic)  Problem # 3:  HYPERTENSION, BENIGN ESSENTIAL (ICD-401.1) Assessment: Improved  Her updated medication list for this problem includes:    Amlodipine Besylate 10 Mg Tabs (Amlodipine besylate) .Marland Kitchen... Take 1 tablet by mouth once a day  BP today: 142/82 Prior BP: 168/100 (12/06/2007)  Labs Reviewed: K+: 4.0 (07/04/2007) Creat: : 0.8 (07/04/2007)   Chol: 188 (07/04/2007)   HDL: 66.6 (07/04/2007)   LDL: 111 (07/04/2007)   TG: 52 (07/04/2007)  Orders: Tobacco use cessation intermediate 3-10 minutes (99406)  Problem # 4:  ANXIETY STATE NOS (ICD-300.00) Assessment: Unchanged  Her updated medication list for this problem includes:    Alprazolam 1 Mg Tbdp (Alprazolam) .Marland Kitchen... 1 tab by mouth tid  Discussed medication use and relaxation techniques.   Problem # 5:  HYPERCOAGULABLE STATE, PRIMARY (ICD-289.81) Assessment: Deteriorated start coumadin at previous dose per her report Orders: [facility] Referral (Coumadin clinic)  Complete Medication List: 1)  Amlodipine Besylate 10 Mg Tabs (Amlodipine besylate) .... Take 1 tablet by mouth once a day 2)  Multivitamins Tabs (Multiple vitamin) .... Take 1 tablet by mouth once a day 3)  Alprazolam 1 Mg Tbdp (Alprazolam) .Marland Kitchen.. 1 tab by mouth tid 4)  Doxycycline Hyclate 100 Mg Tabs (Doxycycline hyclate) .... Take 1 tablet by mouth two times a day 5)  Vicodin 5-500 Mg Tabs (Hydrocodone-acetaminophen) .... Every 4-6 hours as needed 6)  Coumadin 7.5 Mg Tabs (Warfarin sodium) .... One by mouth once daily  Patient Instructions: 1)  Please schedule a follow-up appointment in 2 weeks. 2)  Tobacco is very bad for your health and your loved ones! You Should stop smoking!. 3)  Stop Smoking Tips: Choose a Quit date. Cut down before the Quit date. decide what you will do as a substitute when you feel the urge to  smoke(gum,toothpick,exercise). 4)  It is important that you exercise regularly at least 20 minutes 5 times a week. If you develop chest pain, have severe difficulty breathing, or feel very tired , stop exercising immediately and seek medical attention. 5)  You need to lose weight. Consider a lower calorie diet and regular exercise.  6)  Check your Blood Pressure regularly. If it is above 140/90: you should make an appointment. Prescriptions: COUMADIN 7.5 MG TABS (WARFARIN SODIUM) One by mouth once daily  #30 x 1   Entered and Authorized by:   Etta Grandchild MD   Signed by:   Etta Grandchild MD on 04/27/2010   Method used:   Print then Give to Patient   RxID:   [id number] ALPRAZOLAM 1 MG  TBDP (ALPRAZOLAM) 1 tab by mouth TID  #90 x 5  Entered and Authorized by:   Etta Grandchild MD   Signed by:   Etta Grandchild MD on 04/27/2010   Method used:   Print then Give to Patient   RxID:   [id number]

## 2010-10-13 NOTE — Progress Notes (Signed)
  DDS request received sent to [facility]. [facility]  July 15, 2010 8:51 AM

## 2010-10-13 NOTE — Medication Information (Signed)
Summary: new to coumadin/hx of dvt/jml  Anticoagulant Therapy  Managed by: Leota Sauers, PharmD, BCPS, CPP Referring MD: Dr. Yetta Barre PCP: Etta Grandchild MD Supervising MD: Excell Seltzer MD, Casimiro Needle Indication 1: DVT INR POC 1.5  Dietary changes: no    Health status changes: no    Bleeding/hemorrhagic complications: no    Recent/future hospitalizations: no    Any changes in medication regimen? no    Recent/future dental: no  Any missed doses?: no       Is patient compliant with meds? yes      Comments: Hx DVT, new Dx Prt S def Started 8/16 coumadin 7.5mg  once daily X3 days  Current Medications (verified): 1)  Amlodipine Besylate 10 Mg  Tabs (Amlodipine Besylate) .... Take 1 Tablet By Mouth Once A Day 2)  Multivitamins   Tabs (Multiple Vitamin) .... Take 1 Tablet By Mouth Once A Day 3)  Alprazolam 1 Mg  Tbdp (Alprazolam) .Marland Kitchen.. 1 Tab By Mouth Tid 4)  Doxycycline Hyclate 100 Mg Tabs (Doxycycline Hyclate) .... Take 1 Tablet By Mouth Two Times A Day 5)  Vicodin 5-500 Mg Tabs (Hydrocodone-Acetaminophen) .... Every 4-6 Hours As Needed 6)  Coumadin 7.5 Mg Tabs (Warfarin Sodium) .... One By Mouth Once Daily  Allergies (verified): 1)  ! * Naproxen 2)  ! Fioricet (Butalbital-Apap-Caffeine)  Anticoagulation Management History:      The patient is taking warfarin and comes in today for a routine follow up visit.  Negative risk factors for bleeding include an age less than 31 years old.  The bleeding index is 'low risk'.  Positive CHADS2 values include History of HTN.  Negative CHADS2 values include Age > 46 years old.  Anticoagulation responsible provider: Excell Seltzer MD, Casimiro Needle.  INR POC: 1.5.  Cuvette Lot#: [id number].    Anticoagulation Management Assessment/Plan:      The patient's current anticoagulation dose is Coumadin 7.5 mg tabs: One by mouth once daily.  The next INR is due 05/04/2010.  Results were reviewed/authorized by Leota Sauers, PharmD, BCPS, CPP.         Current Anticoagulation  Instructions: INR 1.5 Coumadin 7.5mg  each day

## 2010-10-15 ENCOUNTER — Encounter (HOSPITAL_BASED_OUTPATIENT_CLINIC_OR_DEPARTMENT_OTHER): Payer: Medicaid Other | Attending: Internal Medicine

## 2010-10-15 DIAGNOSIS — L97809 Non-pressure chronic ulcer of other part of unspecified lower leg with unspecified severity: Secondary | ICD-10-CM | POA: Insufficient documentation

## 2010-10-15 DIAGNOSIS — I872 Venous insufficiency (chronic) (peripheral): Secondary | ICD-10-CM | POA: Insufficient documentation

## 2010-10-15 DIAGNOSIS — D6859 Other primary thrombophilia: Secondary | ICD-10-CM | POA: Insufficient documentation

## 2010-10-15 NOTE — Progress Notes (Signed)
  Phone Note Call from Patient Call back at Home Phone [phone]   Caller: Patient---[phone] Call For: Etta Grandchild MD Summary of Call: Pt left the following message on triage A: Pt has had kidney stone in the past and knows the feeling,symptoms. Nausea, pain,.feels like she has to vomit and is requesting phenegran. Pt is trying to avoid going to the ER. Please advise. Initial call taken by: Verdell Face,  September 25, 2010 9:57 AM  Follow-up for Phone Call        LMOVM for patient to check  with pharmacy.Alvy Beal Archie CMA  September 25, 2010 1:28 PM     New/Updated Medications: PROMETHAZINE HCL 25 MG TABS (PROMETHAZINE HCL) One by mouth three times a day as needed for nausea or vomiting Prescriptions: PROMETHAZINE HCL 25 MG TABS (PROMETHAZINE HCL) One by mouth three times a day as needed for nausea or vomiting  #35 x 2   Entered and Authorized by:   Etta Grandchild MD   Signed by:   Etta Grandchild MD on 09/25/2010   Method used:   Electronically to        [facility]  [address] [id number]* (retail)       [address]       [place], [place]  [address]       Ph: [phone] or [phone]       Fax: [phone]   RxID:   [id number]

## 2010-10-15 NOTE — Progress Notes (Signed)
Summary: RESULTS  Phone Note Call from Patient Call back at [facility] Phone [phone]   Summary of Call: Pt c/o increasing pain. Wants results of labs.  Initial call taken by: Lamar Sprinkles, CMA,  October 01, 2010 3:19 PM  Follow-up for Phone Call        urine is normal Follow-up by: Etta Grandchild MD,  October 01, 2010 3:26 PM  Additional Follow-up for Phone Call Additional follow up Details #1::        Pt had severe pain last night. She is worried that if no blood in the urine then what could cause this? This is day 8 of pain.  Additional Follow-up by: Lamar Sprinkles, CMA,  October 02, 2010 9:02 AM    Additional Follow-up for Phone Call Additional follow up Details #2::    I ordered a CT scan to find out Follow-up by: Etta Grandchild MD,  October 02, 2010 9:16 AM  Additional Follow-up for Phone Call Additional follow up Details #3:: Details for Additional Follow-up Action Taken: Pt informed  Additional Follow-up by: Lamar Sprinkles, CMA,  October 02, 2010 12:37 PM

## 2010-10-15 NOTE — Progress Notes (Signed)
Summary: Kidney stone?   Phone Note Call from Patient   Summary of Call: Pt left vm, difficulty urinating and called last week - thought she may have another kidney stone. She feels it has not passed and has alot of pain. Patient wants a call back. left mess for pt that she needs eval in the office or if unable to urinate and/or symptoms were severe to go to ER.  Initial call taken by: Lamar Sprinkles, CMA,  September 29, 2010 2:46 PM  Follow-up for Phone Call        Pt left vm - she recieved my message. Unable to drive and children are not avail to help her. She will go to ER w/severe symptoms but would like apt tomorrow am for eval.  Follow-up by: Lamar Sprinkles, CMA,  September 29, 2010 3:48 PM  Additional Follow-up for Phone Call Additional follow up Details #1::        left message on vm to call back to set up appt. Additional Follow-up by: Verdell Face,  September 30, 2010 4:03 PM    Additional Follow-up for Phone Call Additional follow up Details #2::    appt made. Follow-up by: Verdell Face,  October 01, 2010 8:09 AM

## 2010-10-15 NOTE — Letter (Signed)
Summary: Results Follow-up Letter  [facility]  [address] [place], [place] [address]   Phone: [phone]  Fax: [phone]    09/25/2010  [address] [place], [place]  [address]  Dear Ms. [facility],   The following are the results of your recent test(s):  Test     Result     Coumadin level   too low - please get to the coumadin clinic very soon CBC       normal Kidney     normal   _________________________________________________________  Please call for an appointment soon _________________________________________________________ _________________________________________________________ _________________________________________________________  Sincerely,  Sanda Linger MD [facility]

## 2010-10-15 NOTE — Assessment & Plan Note (Signed)
Summary: PER FLAG SCHED--URINATION DIFF  PHONE  ---STC   Vital Signs:  Patient profile:   55 year old female Menstrual status:  postmenopausal Height:      63 inches Weight:      183.13 pounds BMI:     32.56 O2 Sat:      97 % on Room air Temp:     97.8 degrees F oral Pulse rate:   78 / minute Pulse rhythm:   regular Resp:     16 per minute BP sitting:   140 / 84  (left arm) Cuff size:   regular  Vitals Entered By: Zella Ball Ewing CMA Duncan Dull) (October 01, 2010 9:17 AM)  Nutrition Counseling: Patient's BMI is greater than 25 and therefore counseled on weight management options.  O2 Flow:  Room air CC: Kidney Stone/RE Is Patient Diabetic? No Pain Assessment Patient in pain? yes     Location: lower back Intensity: 2 Type: sharp   Primary Care Provider:  Etta Grandchild MD  CC:  Kidney Stone/RE.  History of Present Illness: She returns for f/up and she tells me that she has had bilateral low back and flank pain for 6 days, L>R. She has had some dysuria, hematuria, and hesitancy and is concerned that she may have another kidney stone.  Preventive Screening-Counseling & Management  Alcohol-Tobacco     Alcohol drinks/day: 0     Alcohol Counseling: not indicated; patient does not drink     Smoking Status: current     Smoking Cessation Counseling: yes     Smoke Cessation Stage: contemplative     Packs/Day: 1.0     Year Started: 1975     Pack years: 48     Tobacco Counseling: to quit use of tobacco products  Hep-HIV-STD-Contraception     Hepatitis Risk: no risk noted     HIV Risk: no risk noted     STD Risk: no risk noted  Clinical Review Panels:  Prevention   Last Mammogram:  normal (09/14/2007)   Last Pap Smear:  normal (09/14/2007)   Last Colonoscopy:  Pt Never had (05/11/2010)  Immunizations   Last Tetanus Booster:  Td (09/13/2000)   Last Flu Vaccine:  Declined (05/11/2010)  Lipid Management   Cholesterol:  188 (07/04/2007)   LDL (bad choesterol):  111  (07/04/2007)   HDL (good cholesterol):  66.6 (07/04/2007)  Diabetes Management   Creatinine:  0.7 (09/24/2010)   Last Flu Vaccine:  Declined (05/11/2010)  CBC   WBC:  10.2 (09/24/2010)   RBC:  4.76 (09/24/2010)   Hgb:  15.6 (09/24/2010)   Hct:  44.8 (09/24/2010)   Platelets:  236.0 (09/24/2010)   MCV  94.2 (09/24/2010)   MCHC  34.8 (09/24/2010)   RDW  12.7 (09/24/2010)   PMN:  60.6 (09/24/2010)   Lymphs:  34.5 (09/24/2010)   Monos:  0.8 (09/24/2010)   Eosinophils:  3.1 (09/24/2010)   Basophil:  1.0 (09/24/2010)  Complete Metabolic Panel   Glucose:  94 (09/24/2010)   Sodium:  140 (09/24/2010)   Potassium:  4.1 (09/24/2010)   Chloride:  103 (09/24/2010)   CO2:  27 (09/24/2010)   BUN:  10 (09/24/2010)   Creatinine:  0.7 (09/24/2010)   Albumin:  4.2 (07/04/2007)   Total Protein:  7.1 (07/04/2007)   Calcium:  8.9 (09/24/2010)   Total Bili:  0.9 (07/04/2007)   Alk Phos:  75 (07/04/2007)   SGPT (ALT):  39 (07/04/2007)   SGOT (AST):  35 (07/04/2007)   Medications  Prior to Update: 1)  Amlodipine Besylate 10 Mg  Tabs (Amlodipine Besylate) .... Take 1 Tablet By Mouth Once A Day 2)  Multivitamins   Tabs (Multiple Vitamin) .... Take 1 Tablet By Mouth Once A Day 3)  Alprazolam 1 Mg  Tbdp (Alprazolam) .Marland Kitchen.. 1 Tab By Mouth Tid 4)  Coumadin 7.5 Mg Tabs (Warfarin Sodium) .... One By Mouth Once Daily 5)  Percocet 10-325 Mg Tabs (Oxycodone-Acetaminophen) 6)  Doxycycline Hyclate 100 Mg Caps (Doxycycline Hyclate) .... As Directed 7)  Voltaren 1 % Gel (Diclofenac Sodium) .... Apply To Aa Qid As Needed For Pain 8)  Promethazine Hcl 25 Mg Tabs (Promethazine Hcl) .... One By Mouth Three Times A Day As Needed For Nausea or Vomiting  Current Medications (verified): 1)  Amlodipine Besylate 10 Mg  Tabs (Amlodipine Besylate) .... Take 1 Tablet By Mouth Once A Day 2)  Multivitamins   Tabs (Multiple Vitamin) .... Take 1 Tablet By Mouth Once A Day 3)  Alprazolam 1 Mg  Tbdp (Alprazolam) .Marland Kitchen.. 1 Tab By  Mouth Tid 4)  Coumadin 7.5 Mg Tabs (Warfarin Sodium) .... One By Mouth Once Daily 5)  Percocet 10-325 Mg Tabs (Oxycodone-Acetaminophen) .... One By Mouth Three Times A Day As Needed For Pain 6)  Doxycycline Hyclate 100 Mg Caps (Doxycycline Hyclate) .... As Directed 7)  Voltaren 1 % Gel (Diclofenac Sodium) .... Apply To Aa Qid As Needed For Pain 8)  Promethazine Hcl 25 Mg Tabs (Promethazine Hcl) .... One By Mouth Three Times A Day As Needed For Nausea or Vomiting  Allergies (verified): 1)  ! * Naproxen 2)  ! Fioricet (Butalbital-Apap-Caffeine)  Past History:  Past Medical History: Last updated: 12/29/2007 TOBACCO ABUSE (ICD-305.1) URINARY INCONTINENCE (ICD-788.30) ROTATOR CUFF TEAR (ICD-727.61) DEEP VENOUS THROMBOPHLEBITIS (ICD-453.40) RENAL CALCULUS (ICD-592.0) DEGENERATIVE DISC DISEASE (ICD-722.6) CHEST PAIN, PLEURITIC (ICD-786.52) GASTROENTERITIS (ICD-558.9) HYPERTENSION, BENIGN ESSENTIAL (ICD-401.1) FAMILY HISTORY OF CAD FEMALE 1ST DEGREE RELATIVE <50 (ICD-V17.3) ANXIETY STATE NOS (ICD-300.00) HYPERCHOLESTEROLEMIA, PURE (ICD-272.0) HYPERCOAGULABLE STATE, PRIMARY (ICD-289.81)    Past Surgical History: Last updated: 12/29/2007 2006    DVT, femoral artery 1980's Sinus surgery 1992    BTL, reversal  Family History: Last updated: 12/29/2007 Family History of CAD Female 1st degree relative <50 Father: Died 69, Lung CA, MI (CABG), DM, Protein S Deficiency Mother: Died 87 of breast CA Siblings: 1 brother and 1 sister, healthy HBP:  MGM (+) Mi DM:  (+) Breast CA:  Mom  Social History: Last updated: 09/24/2010 Current Smoker, 1/2 PPD Alcohol use-yes, wine occasionally Drug use-no Marital Status: Married Occupation: currently disabled  Risk Factors: Alcohol Use: 0 (10/01/2010)  Risk Factors: Smoking Status: current (10/01/2010) Packs/Day: 1.0 (10/01/2010)  Family History: Reviewed history from 12/29/2007 and no changes required. Family History of CAD Female 1st degree  relative <50 Father: Died 38, Lung CA, MI (CABG), DM, Protein S Deficiency Mother: Died 64 of breast CA Siblings: 1 brother and 1 sister, healthy HBP:  MGM (+) Mi DM:  (+) Breast CA:  Mom  Social History: Reviewed history from 09/24/2010 and no changes required. Current Smoker, 1/2 PPD Alcohol use-yes, wine occasionally Drug use-no Marital Status: Married Occupation: currently disabled  Review of Systems  The patient denies anorexia, fever, weight loss, weight gain, chest pain, syncope, dyspnea on exertion, peripheral edema, prolonged cough, headaches, hemoptysis, abdominal pain, difficulty walking, depression, abnormal bleeding, and enlarged lymph nodes.   GU:  Complains of dysuria, hematuria, and urinary hesitancy; denies abnormal vaginal bleeding, discharge, incontinence, nocturia, and urinary frequency.  Physical  Exam  General:  alert, well-developed, well-nourished, well-hydrated, appropriate dress, normal appearance, healthy-appearing, and cooperative to examination.   Head:  Normocephalic and atraumatic without obvious abnormalities. No apparent alopecia or balding. Eyes:  vision grossly intact, pupils equal, and no injection.   Mouth:  Oral mucosa and oropharynx without lesions or exudates.  Teeth in good repair. Neck:  supple, full ROM, and no masses.   Lungs:  Normal respiratory effort, chest expands symmetrically. Lungs are clear to auscultation, no crackles or wheezes. Heart:  Normal rate and regular rhythm. S1 and S2 normal without gallop, murmur, click, rub or other extra sounds. Abdomen:  soft, non-tender, normal bowel sounds, no distention, no masses, no guarding, no rigidity, no rebound tenderness, no abdominal hernia, no inguinal hernia, no hepatomegaly, and no splenomegaly.  no CVAT. Msk:  LLE is in an [employer], RLE is normal with no edema Pulses:  R and L carotid,radial,femoral,dorsalis pedis and posterior tibial pulses are full and equal bilaterally Neurologic:   No cranial nerve deficits noted. Station and gait are normal. Plantar reflexes are down-going bilaterally. DTRs are symmetrical throughout. Sensory, motor and coordinative functions appear intact. Skin:  turgor normal, color normal, no rashes, no suspicious lesions, no ecchymoses, no petechiae, and no purpura.   Cervical Nodes:  no anterior cervical adenopathy and no posterior cervical adenopathy.   Axillary Nodes:  no R axillary adenopathy and no L axillary adenopathy.   Psych:  Cognition and judgment appear intact. Alert and cooperative with normal attention span and concentration. No apparent delusions, illusions, hallucinations   Impression & Recommendations:  Problem # 1:  RENAL CALCULUS (ICD-592.0) Assessment Deteriorated i will look at a UA today and if there is blood or active sediment I may consider doing a CT to look for stones  Problem # 2:  BACK PAIN (ICD-724.5) Assessment: New  Her updated medication list for this problem includes:    Percocet 10-325 Mg Tabs (Oxycodone-acetaminophen) ..... One by mouth three times a day as needed for pain  Problem # 3:  UTI (ICD-599.0) Assessment: New  Her updated medication list for this problem includes:    Doxycycline Hyclate 100 Mg Caps (Doxycycline hyclate) .Marland Kitchen... As directed  Orders: T-Urine Culture (Spectrum Order) [id number]) TLB-Udip w/ Micro (81001-URINE)  Complete Medication List: 1)  Amlodipine Besylate 10 Mg Tabs (Amlodipine besylate) .... Take 1 tablet by mouth once a day 2)  Multivitamins Tabs (Multiple vitamin) .... Take 1 tablet by mouth once a day 3)  Alprazolam 1 Mg Tbdp (Alprazolam) .Marland Kitchen.. 1 tab by mouth tid 4)  Coumadin 7.5 Mg Tabs (Warfarin sodium) .... One by mouth once daily 5)  Percocet 10-325 Mg Tabs (Oxycodone-acetaminophen) .... One by mouth three times a day as needed for pain 6)  Doxycycline Hyclate 100 Mg Caps (Doxycycline hyclate) .... As directed 7)  Voltaren 1 % Gel (Diclofenac sodium) .... Apply to aa  qid as needed for pain 8)  Promethazine Hcl 25 Mg Tabs (Promethazine hcl) .... One by mouth three times a day as needed for nausea or vomiting  Patient Instructions: 1)  Please schedule a follow-up appointment in 1 month. 2)  Tobacco is very bad for your health and your loved ones! You Should stop smoking!. 3)  Stop Smoking Tips: Choose a Quit date. Cut down before the Quit date. decide what you will do as a substitute when you feel the urge to smoke(gum,toothpick,exercise). 4)  It is important that you exercise regularly at least 20 minutes 5 times a week.  If you develop chest pain, have severe difficulty breathing, or feel very tired , stop exercising immediately and seek medical attention. 5)  You need to lose weight. Consider a lower calorie diet and regular exercise.  Prescriptions: PERCOCET 10-325 MG TABS (OXYCODONE-ACETAMINOPHEN) One by mouth three times a day as needed for pain  #90 x 0   Entered and Authorized by:   Etta Grandchild MD   Signed by:   Etta Grandchild MD on 10/01/2010   Method used:   Print then Give to Patient   RxID:   [id number]    Orders Added: 1)  T-Urine Culture (Spectrum Order) [[id number]] 2)  Est. Patient Level IV [84696] 3)  TLB-Udip w/ Micro [81001-URINE]

## 2010-10-15 NOTE — Letter (Signed)
Summary: [facility] - Delinquent Coumadin 1  Coumadin  [address]   [place], [place] [address]   Phone: [phone]  Fax: [phone]     October 05, 2010 MRN: [id number]   [facility] [address] [place], [place]  [address]   Dear Ms. [facility],  This letter is being sent to you as a reminder that it is necessary for you to get your INR/PT checked regularly so that we can optimize your care.  Our records indicate that you were scheduled to have a test done recently.  As of today, we have not received the results of this test.  It is very important that you have your INR checked.  Please call our office at the number listed above to schedule an appointment at your earliest convenience.    If you have recently had your protime checked or have discontinued this medication, please contact our office at the above phone number to clarify this issue.  Thank you for this prompt attention to this important health care matter.  Sincerely,   [facility] Team    Appended Document: [facility] - Delinquent Coumadin 1 LMOM for pt to call to s/c CVRR appt since she is past due.

## 2010-10-15 NOTE — Assessment & Plan Note (Signed)
Summary: discuss several issues/cd   Vital Signs:  Patient profile:   55 year old female Menstrual status:  postmenopausal Height:      63.5 inches Weight:      184 pounds BMI:     32.20 O2 Sat:      92 % on Room air Temp:     98.6 degrees F oral Pulse rate:   87 / minute Pulse rhythm:   regular Resp:     16 per minute BP sitting:   158 / 100  (left arm) Cuff size:   large  Vitals Entered By: Rock Nephew CMA (September 24, 2010 2:06 PM)  Nutrition Counseling: Patient's BMI is greater than 25 and therefore counseled on weight management options.  O2 Flow:  Room air  Primary Care Provider:  Etta Grandchild MD  CC:  Neck pain.  History of Present Illness:  Neck Pain      This is a 55 year old woman who presents with Neck pain.  The problem began 2 weeks ago.  The intensity is described as mild.  The patient reports midline neck pain and bilateral neck pain.  Associated symptoms include impaired neck ROM.  The patient denies the following associated symptoms: numbness, weakness, impaired coordination, gait disturbance, tingling/parasthesias, fever, bladder dysfunction, bowel dysfunction, locking, and clicking.  The pain is better with NSAIDs and acetaminophen.    Also, she has a cyst on her back that has been bothering her for several years. When she lived in [place] it got infected and had to be drained.  She has been out of amlodipine for several weeks.  Hypertension History:      She denies headache, chest pain, palpitations, dyspnea with exertion, orthopnea, PND, peripheral edema, visual symptoms, neurologic problems, syncope, and side effects from treatment.  She notes no problems with any antihypertensive medication side effects.        Positive major cardiovascular risk factors include hyperlipidemia, hypertension, and current tobacco user.  Negative major cardiovascular risk factors include female age less than 29 years old, no history of diabetes, and negative family  history for ischemic heart disease.        Further assessment for target organ damage reveals no history of ASHD, cardiac end-organ damage (CHF/LVH), stroke/TIA, peripheral vascular disease, renal insufficiency, or hypertensive retinopathy.     Preventive Screening-Counseling & Management  Alcohol-Tobacco     Alcohol drinks/day: 0     Alcohol Counseling: not indicated; patient does not drink     Smoking Status: current     Smoking Cessation Counseling: yes     Smoke Cessation Stage: contemplative     Packs/Day: 1.0     Year Started: 1975     Pack years: 85     Tobacco Counseling: to quit use of tobacco products  Hep-HIV-STD-Contraception     Hepatitis Risk: no risk noted     HIV Risk: no risk noted     STD Risk: no risk noted      Drug Use:  never.        Blood Transfusions:  no.    Clinical Review Panels:  Prevention   Last Mammogram:  normal (09/14/2007)   Last Pap Smear:  normal (09/14/2007)   Last Colonoscopy:  Pt Never had (05/11/2010)  Immunizations   Last Tetanus Booster:  Td (09/13/2000)   Last Flu Vaccine:  Declined (05/11/2010)  Lipid Management   Cholesterol:  188 (07/04/2007)   LDL (bad choesterol):  111 (07/04/2007)   HDL (  good cholesterol):  66.6 (07/04/2007)  Diabetes Management   Creatinine:  0.8 (07/04/2007)   Last Flu Vaccine:  Declined (05/11/2010)  CBC   WBC:  9.6 (07/04/2007)   RBC:  4.47 (07/04/2007)   Hgb:  15.2 (07/04/2007)   Hct:  42.4 (07/04/2007)   Platelets:  285 (07/04/2007)   MCV  94.9 (07/04/2007)   MCHC  35.9 (07/04/2007)   RDW  11.9 (07/04/2007)   PMN:  69.3 (07/04/2007)   Lymphs:  23.5 (07/04/2007)   Monos:  3.4 (07/04/2007)   Eosinophils:  2.9 (07/04/2007)   Basophil:  0.9 (07/04/2007)  Complete Metabolic Panel   Glucose:  94 (07/04/2007)   Sodium:  141 (07/04/2007)   Potassium:  4.0 (07/04/2007)   Chloride:  103 (07/04/2007)   CO2:  31 (07/04/2007)   BUN:  8 (07/04/2007)   Creatinine:  0.8 (07/04/2007)   Albumin:   4.2 (07/04/2007)   Total Protein:  7.1 (07/04/2007)   Calcium:  9.3 (07/04/2007)   Total Bili:  0.9 (07/04/2007)   Alk Phos:  75 (07/04/2007)   SGPT (ALT):  39 (07/04/2007)   SGOT (AST):  35 (07/04/2007)   Medications Prior to Update: 1)  Amlodipine Besylate 10 Mg  Tabs (Amlodipine Besylate) .... Take 1 Tablet By Mouth Once A Day 2)  Multivitamins   Tabs (Multiple Vitamin) .... Take 1 Tablet By Mouth Once A Day 3)  Alprazolam 1 Mg  Tbdp (Alprazolam) .Marland Kitchen.. 1 Tab By Mouth Tid 4)  Vicodin 5-500 Mg Tabs (Hydrocodone-Acetaminophen) .... Every 4-6 Hours As Needed 5)  Coumadin 7.5 Mg Tabs (Warfarin Sodium) .... One By Mouth Once Daily  Current Medications (verified): 1)  Amlodipine Besylate 10 Mg  Tabs (Amlodipine Besylate) .... Take 1 Tablet By Mouth Once A Day 2)  Multivitamins   Tabs (Multiple Vitamin) .... Take 1 Tablet By Mouth Once A Day 3)  Alprazolam 1 Mg  Tbdp (Alprazolam) .Marland Kitchen.. 1 Tab By Mouth Tid 4)  Coumadin 7.5 Mg Tabs (Warfarin Sodium) .... One By Mouth Once Daily 5)  Percocet 10-325 Mg Tabs (Oxycodone-Acetaminophen) 6)  Doxycycline Hyclate 100 Mg Caps (Doxycycline Hyclate) .... As Directed 7)  Voltaren 1 % Gel (Diclofenac Sodium) .... Apply To Aa Qid As Needed For Pain  Allergies (verified): 1)  ! * Naproxen 2)  ! Fioricet (Butalbital-Apap-Caffeine)  Past History:  Past Medical History: Last updated: 12/29/2007 TOBACCO ABUSE (ICD-305.1) URINARY INCONTINENCE (ICD-788.30) ROTATOR CUFF TEAR (ICD-727.61) DEEP VENOUS THROMBOPHLEBITIS (ICD-453.40) RENAL CALCULUS (ICD-592.0) DEGENERATIVE DISC DISEASE (ICD-722.6) CHEST PAIN, PLEURITIC (ICD-786.52) GASTROENTERITIS (ICD-558.9) HYPERTENSION, BENIGN ESSENTIAL (ICD-401.1) FAMILY HISTORY OF CAD FEMALE 1ST DEGREE RELATIVE <50 (ICD-V17.3) ANXIETY STATE NOS (ICD-300.00) HYPERCHOLESTEROLEMIA, PURE (ICD-272.0) HYPERCOAGULABLE STATE, PRIMARY (ICD-289.81)    Past Surgical History: Last updated: 12/29/2007 2006    DVT, femoral  artery 1980's Sinus surgery 1992    BTL, reversal  Family History: Last updated: 12/29/2007 Family History of CAD Female 1st degree relative <50 Father: Died 26, Lung CA, MI (CABG), DM, Protein S Deficiency Mother: Died 10 of breast CA Siblings: 1 brother and 1 sister, healthy HBP:  MGM (+) Mi DM:  (+) Breast CA:  Mom  Social History: Last updated: 09/24/2010 Current Smoker, 1/2 PPD Alcohol use-yes, wine occasionally Drug use-no Marital Status: Married Occupation: currently disabled  Risk Factors: Alcohol Use: 0 (09/24/2010)  Risk Factors: Smoking Status: current (09/24/2010) Packs/Day: 1.0 (09/24/2010)  Family History: Reviewed history from 12/29/2007 and no changes required. Family History of CAD Female 1st degree relative <50 Father: Died 3, Lung CA, MI (  CABG), DM, Protein S Deficiency Mother: Died 64 of breast CA Siblings: 1 brother and 1 sister, healthy HBP:  MGM (+) Mi DM:  (+) Breast CA:  Mom  Social History: Reviewed history from 12/29/2007 and no changes required. Current Smoker, 1/2 PPD Alcohol use-yes, wine occasionally Drug use-no Marital Status: Married Occupation: currently disabled  Review of Systems  The patient denies anorexia, fever, chest pain, syncope, dyspnea on exertion, peripheral edema, prolonged cough, headaches, hemoptysis, abdominal pain, hematuria, suspicious skin lesions, enlarged lymph nodes, and angioedema.   Resp:  Denies chest pain with inspiration, cough, coughing up blood, excessive snoring, morning headaches, pleuritic, shortness of breath, sputum productive, and wheezing. Psych:  Complains of anxiety; denies depression, easily angered, easily tearful, irritability, mental problems, panic attacks, sense of great danger, suicidal thoughts/plans, and thoughts of violence. Heme:  Denies abnormal bruising, bleeding, enlarge lymph nodes, fevers, pallor, and skin discoloration.  Physical Exam  General:   Well-developed,well-nourished,in no acute distress; alert,appropriate and cooperative throughout examination Head:  normocephalic, atraumatic, no abnormalities observed, and no abnormalities palpated.   Eyes:  vision grossly intact, pupils equal, pupils round, and pupils reactive to light.   Mouth:  Oral mucosa and oropharynx without lesions or exudates.  Teeth in good repair. Neck:  supple, full ROM, and no masses.   Lungs:  Normal respiratory effort, chest expands symmetrically. Lungs are clear to auscultation, no crackles or wheezes. Heart:  Normal rate and regular rhythm. S1 and S2 normal without gallop, murmur, click, rub or other extra sounds. Abdomen:  soft, non-tender, normal bowel sounds, no distention, no masses, no guarding, no rigidity, no rebound tenderness, no abdominal hernia, no inguinal hernia, no hepatomegaly, and no splenomegaly.   Msk:  LLE is in an [employer], RLE is normal with no edema Skin:  she has large pore on her left mid-back with a soft, non-tender, non-indurated, non-swollen subcutaneous cyst adjacent to the area, the overlying skin is normal with no erythema or exudate Cervical Nodes:  no anterior cervical adenopathy and no posterior cervical adenopathy.   Axillary Nodes:  no R axillary adenopathy and no L axillary adenopathy.   Inguinal Nodes:  no R inguinal adenopathy and no L inguinal adenopathy.   Psych:  Cognition and judgment appear intact. Alert and cooperative with normal attention span and concentration. No apparent delusions, illusions, hallucinations   Impression & Recommendations:  Problem # 1:  EPIDERMOID CYST, BACK (ICD-706.2) Assessment New she wants to see if it can be removed Orders: Surgical Referral (Surgery)  Problem # 2:  NECK PAIN, ACUTE (ICD-723.1) Assessment: New  The following medications were removed from the medication list:    Vicodin 5-500 Mg Tabs (Hydrocodone-acetaminophen) ..... Every 4-6 hours as needed Her updated medication  list for this problem includes:    Percocet 10-325 Mg Tabs (Oxycodone-acetaminophen)  Orders: T-Cervical Spine Comp 4 Views (72050TC)  Problem # 3:  ANTICOAGULATION RX (ICD-V58.61) Assessment: Unchanged  Orders: Venipuncture (04540) TLB-BMP (Basic Metabolic Panel-BMET) (80048-METABOL) TLB-CBC Platelet - w/Differential (85025-CBCD) TLB-PT (Protime) (85610-PTP)  Problem # 4:  DEEP VENOUS THROMBOPHLEBITIS (ICD-453.40) Assessment: Unchanged  Orders: [facility] Referral (Coumadin clinic)  Problem # 5:  HYPERTENSION, BENIGN ESSENTIAL (ICD-401.1) Assessment: Deteriorated  Her updated medication list for this problem includes:    Amlodipine Besylate 10 Mg Tabs (Amlodipine besylate) .Marland Kitchen... Take 1 tablet by mouth once a day  Orders: Venipuncture (98119) TLB-BMP (Basic Metabolic Panel-BMET) (80048-METABOL) TLB-CBC Platelet - w/Differential (85025-CBCD) TLB-PT (Protime) (85610-PTP) Tobacco use cessation intermediate 3-10 minutes (  99406)  BP today: 158/100 Prior BP: 122/80 (05/11/2010)  10 Yr Risk Heart Disease: 11 %  Labs Reviewed: K+: 4.0 (07/04/2007) Creat: : 0.8 (07/04/2007)   Chol: 188 (07/04/2007)   HDL: 66.6 (07/04/2007)   LDL: 111 (07/04/2007)   TG: 52 (07/04/2007)  Problem # 6:  TOBACCO ABUSE (ICD-305.1) Assessment: Unchanged  Orders: Tobacco use cessation intermediate 3-10 minutes (99406)  Problem # 7:  ANXIETY STATE NOS (ICD-300.00) Assessment: Unchanged  Her updated medication list for this problem includes:    Alprazolam 1 Mg Tbdp (Alprazolam) .Marland Kitchen... 1 tab by mouth tid  Discussed medication use and relaxation techniques.   Complete Medication List: 1)  Amlodipine Besylate 10 Mg Tabs (Amlodipine besylate) .... Take 1 tablet by mouth once a day 2)  Multivitamins Tabs (Multiple vitamin) .... Take 1 tablet by mouth once a day 3)  Alprazolam 1 Mg Tbdp (Alprazolam) .Marland Kitchen.. 1 tab by mouth tid 4)  Coumadin 7.5 Mg Tabs (Warfarin sodium) .... One by mouth  once daily 5)  Percocet 10-325 Mg Tabs (Oxycodone-acetaminophen) 6)  Doxycycline Hyclate 100 Mg Caps (Doxycycline hyclate) .... As directed 7)  Voltaren 1 % Gel (Diclofenac sodium) .... Apply to aa qid as needed for pain  Hypertension Assessment/Plan:      The patient's hypertensive risk group is category B: At least one risk factor (excluding diabetes) with no target organ damage.  Her calculated 10 year risk of coronary heart disease is 11 %.  Today's blood pressure is 158/100.  Her blood pressure goal is < 140/90.  Patient Instructions: 1)  Please schedule a follow-up appointment in 2 months. 2)  Tobacco is very bad for your health and your loved ones! You Should stop smoking!. 3)  Stop Smoking Tips: Choose a Quit date. Cut down before the Quit date. decide what you will do as a substitute when you feel the urge to smoke(gum,toothpick,exercise). 4)  It is important that you exercise regularly at least 20 minutes 5 times a week. If you develop chest pain, have severe difficulty breathing, or feel very tired , stop exercising immediately and seek medical attention. 5)  You need to lose weight. Consider a lower calorie diet and regular exercise.  6)  Check your Blood Pressure regularly. If it is above 140/90: you should make an appointment. Prescriptions: COUMADIN 7.5 MG TABS (WARFARIN SODIUM) One by mouth once daily  #90 x 3   Entered and Authorized by:   Etta Grandchild MD   Signed by:   Etta Grandchild MD on 09/24/2010   Method used:   Print then Give to Patient   RxID:   [id number] AMLODIPINE BESYLATE 10 MG  TABS (AMLODIPINE BESYLATE) Take 1 tablet by mouth once a day  #90 x 3   Entered and Authorized by:   Etta Grandchild MD   Signed by:   Etta Grandchild MD on 09/24/2010   Method used:   Print then Give to Patient   RxID:   [id number] ALPRAZOLAM 1 MG  TBDP (ALPRAZOLAM) 1 tab by mouth TID  #90 x 5   Entered and Authorized by:   Etta Grandchild MD   Signed by:   Etta Grandchild MD on 09/24/2010   Method used:   Print then Give to Patient   RxID:   [id number] VOLTAREN 1 % GEL (DICLOFENAC SODIUM) Apply to AA QID as needed for pain  #100 gms x 11   Entered and Authorized by:   Etta Grandchild  MD   Signed by:   Etta Grandchild MD on 09/24/2010   Method used:   Print then Give to Patient   RxID:   [id number]    Orders Added: 1)  [facility] Referral [Coumadin clinic] 2)  Venipuncture [14782] 3)  TLB-BMP (Basic Metabolic Panel-BMET) [80048-METABOL] 4)  TLB-CBC Platelet - w/Differential [85025-CBCD] 5)  TLB-PT (Protime) [85610-PTP] 6)  T-Cervical Spine Comp 4 Views [72050TC] 7)  Surgical Referral [Surgery] 8)  Tobacco use cessation intermediate 3-10 minutes [99406] 9)  Est. Patient Level IV [95621]

## 2010-10-15 NOTE — Progress Notes (Signed)
Summary: APPT--URINATION DIFFICULTY  ---- Converted from flag ---- ---- 09/30/2010 3:47 PM, Rock Nephew CMA wrote: Patient need appt. Thanks ------------------------------ Jovita Gamma pt appt w/Dr Yetta Barre:  10/01/10 @ 930A

## 2010-10-21 NOTE — Progress Notes (Signed)
Summary: RESULTS  Phone Note Call from Patient Call back at Home Phone [phone]   Summary of Call: Patient is requesting results of CT. She continues to have pain in her neck & back. Please advise.  Initial call taken by: Lamar Sprinkles, CMA,  October 12, 2010 2:22 PM  Follow-up for Phone Call        CT normal. will refer to pain clinic Follow-up by: Etta Grandchild MD,  October 12, 2010 2:32 PM  Additional Follow-up for Phone Call Additional follow up Details #1::        Pt informed  Additional Follow-up by: Lamar Sprinkles, CMA,  October 12, 2010 6:08 PM

## 2010-10-22 ENCOUNTER — Telehealth (INDEPENDENT_AMBULATORY_CARE_PROVIDER_SITE_OTHER): Payer: Self-pay | Admitting: *Deleted

## 2010-10-29 NOTE — Progress Notes (Signed)
  Phone Note Call from Patient   Caller: Patient--[phone] Summary of Call: Pt left message on triage a: pt cannot get into pain center until the end of March. Pt requesting hydrocodone prescription at least till her pain clinic appt, please advise. Initial call taken by: Verdell Face,  October 22, 2010 3:45 PM  Follow-up for Phone Call        no, I see on the [facility] webiste that Drs. Leanord Hawking and Arkin have been writing rx's for oxycodone for her so I will not write for hydrocodone Follow-up by: Etta Grandchild MD,  October 22, 2010 5:24 PM

## 2010-11-03 DIAGNOSIS — I82409 Acute embolism and thrombosis of unspecified deep veins of unspecified lower extremity: Secondary | ICD-10-CM

## 2010-11-12 ENCOUNTER — Encounter (HOSPITAL_BASED_OUTPATIENT_CLINIC_OR_DEPARTMENT_OTHER): Payer: Medicaid Other | Attending: Internal Medicine

## 2010-11-12 DIAGNOSIS — I872 Venous insufficiency (chronic) (peripheral): Secondary | ICD-10-CM | POA: Insufficient documentation

## 2010-11-12 DIAGNOSIS — D6859 Other primary thrombophilia: Secondary | ICD-10-CM | POA: Insufficient documentation

## 2010-11-12 DIAGNOSIS — L97809 Non-pressure chronic ulcer of other part of unspecified lower leg with unspecified severity: Secondary | ICD-10-CM | POA: Insufficient documentation

## 2010-11-27 ENCOUNTER — Ambulatory Visit (HOSPITAL_BASED_OUTPATIENT_CLINIC_OR_DEPARTMENT_OTHER): Payer: Medicaid Other | Admitting: Physical Medicine & Rehabilitation

## 2010-11-27 ENCOUNTER — Encounter: Payer: Medicaid Other | Attending: Physical Medicine & Rehabilitation

## 2010-11-27 DIAGNOSIS — M79609 Pain in unspecified limb: Secondary | ICD-10-CM

## 2010-11-27 DIAGNOSIS — M545 Low back pain, unspecified: Secondary | ICD-10-CM | POA: Insufficient documentation

## 2010-11-27 DIAGNOSIS — R05 Cough: Secondary | ICD-10-CM | POA: Insufficient documentation

## 2010-11-27 DIAGNOSIS — F341 Dysthymic disorder: Secondary | ICD-10-CM | POA: Insufficient documentation

## 2010-11-27 DIAGNOSIS — D6859 Other primary thrombophilia: Secondary | ICD-10-CM | POA: Insufficient documentation

## 2010-11-27 DIAGNOSIS — R0602 Shortness of breath: Secondary | ICD-10-CM | POA: Insufficient documentation

## 2010-11-27 DIAGNOSIS — M62838 Other muscle spasm: Secondary | ICD-10-CM | POA: Insufficient documentation

## 2010-11-27 DIAGNOSIS — I8390 Asymptomatic varicose veins of unspecified lower extremity: Secondary | ICD-10-CM | POA: Insufficient documentation

## 2010-11-27 DIAGNOSIS — R209 Unspecified disturbances of skin sensation: Secondary | ICD-10-CM

## 2010-11-27 DIAGNOSIS — R109 Unspecified abdominal pain: Secondary | ICD-10-CM | POA: Insufficient documentation

## 2010-11-27 DIAGNOSIS — R609 Edema, unspecified: Secondary | ICD-10-CM | POA: Insufficient documentation

## 2010-11-27 DIAGNOSIS — K5900 Constipation, unspecified: Secondary | ICD-10-CM | POA: Insufficient documentation

## 2010-11-27 DIAGNOSIS — R5381 Other malaise: Secondary | ICD-10-CM | POA: Insufficient documentation

## 2010-11-27 DIAGNOSIS — R059 Cough, unspecified: Secondary | ICD-10-CM | POA: Insufficient documentation

## 2010-11-27 DIAGNOSIS — M549 Dorsalgia, unspecified: Secondary | ICD-10-CM | POA: Insufficient documentation

## 2010-11-27 DIAGNOSIS — Z86718 Personal history of other venous thrombosis and embolism: Secondary | ICD-10-CM | POA: Insufficient documentation

## 2010-11-27 DIAGNOSIS — E0590 Thyrotoxicosis, unspecified without thyrotoxic crisis or storm: Secondary | ICD-10-CM | POA: Insufficient documentation

## 2010-11-27 DIAGNOSIS — M542 Cervicalgia: Secondary | ICD-10-CM | POA: Insufficient documentation

## 2010-11-27 DIAGNOSIS — M5382 Other specified dorsopathies, cervical region: Secondary | ICD-10-CM

## 2010-11-29 LAB — DIFFERENTIAL
Basophils Absolute: 0 10*3/uL (ref 0.0–0.1)
Basophils Relative: 0 % (ref 0–1)
Eosinophils Relative: 5 % (ref 0–5)
Lymphocytes Relative: 21 % (ref 12–46)
Monocytes Relative: 5 % (ref 3–12)
Neutro Abs: 5.1 10*3/uL (ref 1.7–7.7)

## 2010-11-29 LAB — CBC
HCT: 41.2 % (ref 36.0–46.0)
MCH: 32.8 pg (ref 26.0–34.0)
MCHC: 35.6 g/dL (ref 30.0–36.0)
MCV: 92.1 fL (ref 78.0–100.0)
RBC: 4.47 MIL/uL (ref 3.87–5.11)
RDW: 12.7 % (ref 11.5–15.5)

## 2010-11-29 LAB — URINALYSIS, ROUTINE W REFLEX MICROSCOPIC
Bilirubin Urine: NEGATIVE
Ketones, ur: NEGATIVE mg/dL
Nitrite: NEGATIVE
Specific Gravity, Urine: 1.017 (ref 1.005–1.030)
Urobilinogen, UA: 1 mg/dL (ref 0.0–1.0)
pH: 7 (ref 5.0–8.0)

## 2010-11-29 LAB — COMPREHENSIVE METABOLIC PANEL: CO2: 28 mEq/L (ref 19–32)

## 2010-12-10 ENCOUNTER — Telehealth: Payer: Self-pay | Admitting: *Deleted

## 2010-12-10 NOTE — Telephone Encounter (Signed)
Pt walked in the office and is req rx for pain med for her ulcer. I called the pain clinic and wound center. Pain clinic is not tx pt's back pain with narcotics, only gabapentin 100mg  qid. She is also not bound by pain contract. Wound center last gave pt rx for oxycodone 5 mg #30 on 3/1 and advised her to get further pain meds from her PCP.   Please advise, would you authorize pain medication for pt's ulcer pain?

## 2010-12-10 NOTE — Telephone Encounter (Signed)
No, I think the pain and wound doctors should treat this, the last time I wrote for her pain meds I found that she had other doctors prescribing at the same time so I will not write for narcotic pain meds

## 2010-12-10 NOTE — Telephone Encounter (Signed)
Patient is aware 

## 2010-12-17 ENCOUNTER — Encounter (HOSPITAL_BASED_OUTPATIENT_CLINIC_OR_DEPARTMENT_OTHER): Payer: Medicaid Other | Attending: Internal Medicine

## 2010-12-17 DIAGNOSIS — I872 Venous insufficiency (chronic) (peripheral): Secondary | ICD-10-CM | POA: Insufficient documentation

## 2010-12-17 DIAGNOSIS — L97809 Non-pressure chronic ulcer of other part of unspecified lower leg with unspecified severity: Secondary | ICD-10-CM | POA: Insufficient documentation

## 2010-12-17 DIAGNOSIS — D6859 Other primary thrombophilia: Secondary | ICD-10-CM | POA: Insufficient documentation

## 2010-12-21 LAB — URINALYSIS, ROUTINE W REFLEX MICROSCOPIC
Hgb urine dipstick: NEGATIVE
Nitrite: POSITIVE — AB
Protein, ur: NEGATIVE mg/dL
Urobilinogen, UA: 0.2 mg/dL (ref 0.0–1.0)
pH: 5.5 (ref 5.0–8.0)

## 2010-12-21 LAB — RAPID URINE DRUG SCREEN, HOSP PERFORMED
Amphetamines: NOT DETECTED
Barbiturates: NOT DETECTED
Cocaine: NOT DETECTED
Tetrahydrocannabinol: NOT DETECTED

## 2010-12-21 LAB — COMPREHENSIVE METABOLIC PANEL
AST: 84 U/L — ABNORMAL HIGH (ref 0–37)
Albumin: 3.9 g/dL (ref 3.5–5.2)
Calcium: 9.3 mg/dL (ref 8.4–10.5)
Creatinine, Ser: 0.93 mg/dL (ref 0.4–1.2)
GFR calc Af Amer: >60 mL/min (ref >60)
Total Protein: 7.8 g/dL (ref 6.0–8.3)

## 2010-12-21 LAB — URINE MICROSCOPIC-ADD ON

## 2010-12-21 LAB — CBC
MCHC: 34.9 g/dL (ref 30.0–36.0)
MCV: 102.5 fL — ABNORMAL HIGH (ref 78.0–100.0)
Platelets: 348 10*3/uL (ref 150–400)
RDW: 13.9 % (ref 11.5–15.5)
WBC: 10.4 10*3/uL (ref 4.0–10.5)

## 2010-12-21 LAB — DIFFERENTIAL
Basophils Absolute: 0 10*3/uL (ref 0.0–0.1)
Basophils Relative: 0 % (ref 0–1)
Eosinophils Absolute: 0.2 10*3/uL (ref 0.0–0.7)
Monocytes Relative: 4 % (ref 3–12)

## 2010-12-21 LAB — ETHANOL: Alcohol, Ethyl (B): 187 mg/dL — ABNORMAL HIGH (ref 0–10)

## 2010-12-22 ENCOUNTER — Encounter (HOSPITAL_BASED_OUTPATIENT_CLINIC_OR_DEPARTMENT_OTHER): Payer: Medicaid Other | Attending: General Surgery

## 2010-12-22 DIAGNOSIS — Z7901 Long term (current) use of anticoagulants: Secondary | ICD-10-CM | POA: Insufficient documentation

## 2010-12-22 DIAGNOSIS — I739 Peripheral vascular disease, unspecified: Secondary | ICD-10-CM | POA: Insufficient documentation

## 2010-12-22 DIAGNOSIS — L03119 Cellulitis of unspecified part of limb: Secondary | ICD-10-CM | POA: Insufficient documentation

## 2010-12-22 DIAGNOSIS — I872 Venous insufficiency (chronic) (peripheral): Secondary | ICD-10-CM | POA: Insufficient documentation

## 2010-12-22 DIAGNOSIS — Z79899 Other long term (current) drug therapy: Secondary | ICD-10-CM | POA: Insufficient documentation

## 2010-12-22 DIAGNOSIS — L97809 Non-pressure chronic ulcer of other part of unspecified lower leg with unspecified severity: Secondary | ICD-10-CM | POA: Insufficient documentation

## 2010-12-22 DIAGNOSIS — I10 Essential (primary) hypertension: Secondary | ICD-10-CM | POA: Insufficient documentation

## 2010-12-22 DIAGNOSIS — G589 Mononeuropathy, unspecified: Secondary | ICD-10-CM | POA: Insufficient documentation

## 2010-12-22 DIAGNOSIS — L02419 Cutaneous abscess of limb, unspecified: Secondary | ICD-10-CM | POA: Insufficient documentation

## 2010-12-29 LAB — PROTIME-INR: Prothrombin Time: 13.6 seconds (ref 11.6–15.2)

## 2010-12-29 LAB — CULTURE, BLOOD (ROUTINE X 2)

## 2010-12-29 LAB — CBC
Hemoglobin: 15.5 g/dL — ABNORMAL HIGH (ref 12.0–15.0)
RDW: 12.7 % (ref 11.5–15.5)
WBC: 10.2 10*3/uL (ref 4.0–10.5)

## 2010-12-29 LAB — DIFFERENTIAL
Basophils Absolute: 0.1 10*3/uL (ref 0.0–0.1)
Lymphocytes Relative: 34 % (ref 12–46)
Lymphs Abs: 3.5 10*3/uL (ref 0.7–4.0)
Monocytes Absolute: 1.7 10*3/uL — ABNORMAL HIGH (ref 0.1–1.0)
Neutro Abs: 4.8 10*3/uL (ref 1.7–7.7)

## 2010-12-29 LAB — BASIC METABOLIC PANEL
Calcium: 9.1 mg/dL (ref 8.4–10.5)
GFR calc Af Amer: >60 mL/min (ref >60)
GFR calc non Af Amer: >60 mL/min (ref >60)
Glucose, Bld: 84 mg/dL (ref 70–99)
Sodium: 139 mEq/L (ref 135–145)

## 2011-01-07 ENCOUNTER — Ambulatory Visit: Payer: Medicaid Other | Admitting: Physical Medicine & Rehabilitation

## 2011-01-07 ENCOUNTER — Encounter: Payer: Medicaid Other | Attending: Physical Medicine & Rehabilitation

## 2011-01-07 DIAGNOSIS — I8390 Asymptomatic varicose veins of unspecified lower extremity: Secondary | ICD-10-CM | POA: Insufficient documentation

## 2011-01-07 DIAGNOSIS — M545 Low back pain, unspecified: Secondary | ICD-10-CM | POA: Insufficient documentation

## 2011-01-07 DIAGNOSIS — K5900 Constipation, unspecified: Secondary | ICD-10-CM | POA: Insufficient documentation

## 2011-01-07 DIAGNOSIS — R0602 Shortness of breath: Secondary | ICD-10-CM | POA: Insufficient documentation

## 2011-01-07 DIAGNOSIS — M62838 Other muscle spasm: Secondary | ICD-10-CM | POA: Insufficient documentation

## 2011-01-07 DIAGNOSIS — M549 Dorsalgia, unspecified: Secondary | ICD-10-CM | POA: Insufficient documentation

## 2011-01-07 DIAGNOSIS — R109 Unspecified abdominal pain: Secondary | ICD-10-CM | POA: Insufficient documentation

## 2011-01-07 DIAGNOSIS — M79609 Pain in unspecified limb: Secondary | ICD-10-CM | POA: Insufficient documentation

## 2011-01-07 DIAGNOSIS — E0590 Thyrotoxicosis, unspecified without thyrotoxic crisis or storm: Secondary | ICD-10-CM | POA: Insufficient documentation

## 2011-01-07 DIAGNOSIS — R5381 Other malaise: Secondary | ICD-10-CM | POA: Insufficient documentation

## 2011-01-07 DIAGNOSIS — D6859 Other primary thrombophilia: Secondary | ICD-10-CM | POA: Insufficient documentation

## 2011-01-07 DIAGNOSIS — R059 Cough, unspecified: Secondary | ICD-10-CM | POA: Insufficient documentation

## 2011-01-07 DIAGNOSIS — F341 Dysthymic disorder: Secondary | ICD-10-CM | POA: Insufficient documentation

## 2011-01-07 DIAGNOSIS — M542 Cervicalgia: Secondary | ICD-10-CM | POA: Insufficient documentation

## 2011-01-07 DIAGNOSIS — R609 Edema, unspecified: Secondary | ICD-10-CM | POA: Insufficient documentation

## 2011-01-07 DIAGNOSIS — R209 Unspecified disturbances of skin sensation: Secondary | ICD-10-CM | POA: Insufficient documentation

## 2011-01-07 DIAGNOSIS — R05 Cough: Secondary | ICD-10-CM | POA: Insufficient documentation

## 2011-01-07 DIAGNOSIS — Z86718 Personal history of other venous thrombosis and embolism: Secondary | ICD-10-CM | POA: Insufficient documentation

## 2011-01-14 ENCOUNTER — Encounter (HOSPITAL_BASED_OUTPATIENT_CLINIC_OR_DEPARTMENT_OTHER): Payer: Medicaid Other | Attending: General Surgery

## 2011-01-14 DIAGNOSIS — G589 Mononeuropathy, unspecified: Secondary | ICD-10-CM | POA: Insufficient documentation

## 2011-01-14 DIAGNOSIS — L02419 Cutaneous abscess of limb, unspecified: Secondary | ICD-10-CM | POA: Insufficient documentation

## 2011-01-14 DIAGNOSIS — I739 Peripheral vascular disease, unspecified: Secondary | ICD-10-CM | POA: Insufficient documentation

## 2011-01-14 DIAGNOSIS — Z7901 Long term (current) use of anticoagulants: Secondary | ICD-10-CM | POA: Insufficient documentation

## 2011-01-14 DIAGNOSIS — I10 Essential (primary) hypertension: Secondary | ICD-10-CM | POA: Insufficient documentation

## 2011-01-14 DIAGNOSIS — I872 Venous insufficiency (chronic) (peripheral): Secondary | ICD-10-CM | POA: Insufficient documentation

## 2011-01-14 DIAGNOSIS — Z79899 Other long term (current) drug therapy: Secondary | ICD-10-CM | POA: Insufficient documentation

## 2011-01-14 DIAGNOSIS — L97809 Non-pressure chronic ulcer of other part of unspecified lower leg with unspecified severity: Secondary | ICD-10-CM | POA: Insufficient documentation

## 2011-01-29 NOTE — Assessment & Plan Note (Signed)
[name]                           [facility] OFFICE NOTE   NAME:Brooke Garcia, Brooke Garcia                          MRN:          [id number]  DATE:12/22/2006                            DOB:          01/28/1956    CHIEF COMPLAINT:  A 55 year old white female here to establish new  doctor.   HISTORY OF PRESENT ILLNESS:  Brooke Garcia has not had a regular primary  care doctor in many years.  She states that she sees people  intermittently at urgent cares as well as at the emergency room.  She  has the following chronic concerns:   PROBLEMS:  1. Protein S deficiency, diagnosed in youth:  She states that she has      been having intermittent phlebitis and deep venous thromboses over      the past 20 years.  She was previously on Coumadin several times      over the past 20 years but has stopped intermittently because she      does not like taking medication.  She has had off-and-on problems      with clots since age 75.  Her most recent DVT was in what sounds      like her femoral artery in 2006 when she was hospitalized in [facility].  She does have problems with chronic left leg swelling and      chronic pain in her left leg.  She also has chronic varicose veins,      worse in her left than in her right.  Note that the majority of the      clots that she has had in her past have been in her left leg.  She      does not have an IVC filter.  2. Elevated blood pressure measurement:  Her blood pressure is      elevated today, but she states that she has not had previously      elevated blood pressures.  She denies headache, chest pain,      shortness of breath, or change in peripheral swelling.  3. ? Right rotator cuff tear:  She states that she originally injured      her right shoulder 1-1/2 years ago, but it was never repaired      because she never followed up on it.  She had been initially seen      at [facility], where x-rays were done.  She is  unsure as      to whether it is a partial or full tear but states that since that      point, she has had weakness in her right arm muscles and has      difficulty with lifting her right arm above her head. She has some      tenderness in her anterior shoulder up to her neck and sometimes      down her right arm.  She denies any numbness or tingling.  4. Menopausal symptoms:  She states that she began going through  menopause in 2005 but since then has had worsening symptoms.  She      has problems with night sweats, anxiety.  She cries frequently and      has problems with insomnia.  She states that she has been on Paxil,      Prozac, and Zoloft in the past for her mood but had severe side      effects to these.  She denies suicidal or homicidal ideation.   REVIEW OF SYSTEMS:  No headache, no dizziness.  No chest pain.  No  shortness of breath.  No palpitations.  No nausea or vomiting.  Occasional constipation, possible hemorrhoids.  Occasional blood in her  stools.  Did note one large clot several months ago but no problems  since.   PAST MEDICAL HISTORY:  1. Degenerative disk disease.  2. Kidney stones.  3. Protein S deficiency.  4. Recurrent phlebitis and DVT.  5. Rotator cuff tear.  6. High cholesterol.  7. Urinary incontinence.  8. Tobacco abuse.   HOSPITALIZATIONS/SURGERIES/PROCEDURES:  1. In 2006, femoral artery DVT.  2. In 1980, sinus surgery.  3. In 1992, BTL with reversal.  4. Mammogram in 2005, negative.  5. Pap smear in 2005, negative.   ALLERGIES:  NAPROXEN, FIORICET.   MEDICATIONS:  Benadryl, Aleve.   SOCIAL HISTORY:  She smokes a half pack per day.  She drinks wine  occasionally.  She denies alcohol use.  She works in [employer] and is  currently married.   FAMILY HISTORY:  Father deceased at age 66 with protein S deficiency,  lung cancer, and MI, resulting in CABG as well as diabetes.  Mother  deceased at age 65 with breast cancer.  One brother and one  sister who  are healthy.  Maternal grandmother with MI.  Diabetes also runs in the  grandparents.   PHYSICAL EXAMINATION:  VITAL SIGNS:  Height 63-1/2 inches.  Weight  172.6.  Blood pressure 130/100, pulse 68, temperature 98.  GENERAL:  An overweight-appearing female in no apparent distress.  HEENT:  PERRLA.  Extraocular muscles are intact.  Oropharynx clear.  Tympanic membranes are clear.  Nares clear.  NECK:  No thyromegaly.  No  lymphadenopathy, supraclavicular or cervical.  CARDIOVASCULAR:  Regular rate and rhythm.  No murmurs, rubs or gallops.  Normal PMI.  Nonpitting edema 1+ in the left leg, 0 in the right.  LUNGS:  Clear to auscultation bilaterally.  No wheezes, rales or  rhonchi.  ABDOMEN:  Soft, nontender.  Normoactive bowel sounds.  No  hepatosplenomegaly.  MUSCULOSKELETAL:  Strength 5/5 in upper and lower extremities except  decreased abduction, internal/external rotation in right arm secondary  to pain, about 4/5.  Decreased grip strength as well in the right upper  extremity.  Partially positive drop arm test where at about 90 degrees,  the patient has a slight drop but is able to catch her arm.  Positive  impingement sign.  Positive crossover test.  EXTREMITIES:  Multiple varicose and spider veins in the left leg.  The  left leg diffusely larger than the right with nonpitting edema up to the  thigh.  No current local erythema or phlebitis.  Calves and thighs  bilaterally mildly tender to palpation.  PSYCH:  Appropriate affect.  No suicidal or homicidal ideations.  No  hallucinations.   ASSESSMENT/PLAN:  1. Protein S deficiency:  This patient likely needs lifelong Coumadin.      I discussed her case with Dr. Welton Flakes, hematologist.  She recommended  getting records of previous coagulopathy workup, if possible.  If      unable to document other tests being normal other than protein S      deficiency, then further coagulopathy workup should be repeated.  I     will  begin by trying to obtain records from her previous      hospitalization in 2006.  I will likely refer her to Dr. Welton Flakes to      reinitiate Coumadin and consider coagulopathy workup.  Patient is      also putting herself at higher risk for clot with her smoking.  She      was encouraged to quit smoking.  She is currently trying to taper      down and is not interested in medication at this time.  2. Elevated blood pressure measurement:  Patient will follow this at      home and will let me know if her blood pressures continued to be      above 140/90.  3. Right rotator cuff tendinitis versus partial tear, chronic:  She      has this original injury from 1-1/2 years ago.  I will obtain x-      rays from [facility] to determine what the initial injury      was.  She will likely then need referral to orthopedics for eval      and treat.  4. Menopausal symptoms:  Given her protein S deficiency and the risk      for DVT, I do not think hormone replacement therapy is appropriate      in her.  Given her symptoms of anxiety and mild depression, and      insomnia.  I will recommend starting her on Effexor 37.5 mg daily.      She will give this 3-4 weeks to determine if it helps with her      symptoms.  I am using Effexor because she has had side effects from      SSRIs in the past.  5. Prevention:  She is overdue for a mammogram which will be scheduled      today.  She will also be scheduled to return for a complete      physical exam with Pap.  She is also needing colonoscopy for colon      cancer screen, especially given her history of intermittent blood      in her stool.  She is currently up to date with vaccines.  She will      return for fasting labs to evaluate her cholesterol and perform a      diabetes screen.     Kerby Nora, MD  Electronically Signed    AB/MedQ  DD: 12/22/2006  DT: 12/22/2006  Job #: [id number]

## 2011-02-18 ENCOUNTER — Encounter (HOSPITAL_BASED_OUTPATIENT_CLINIC_OR_DEPARTMENT_OTHER): Payer: Medicaid Other | Attending: Internal Medicine

## 2011-02-18 DIAGNOSIS — I872 Venous insufficiency (chronic) (peripheral): Secondary | ICD-10-CM | POA: Insufficient documentation

## 2011-02-18 DIAGNOSIS — Z86718 Personal history of other venous thrombosis and embolism: Secondary | ICD-10-CM | POA: Insufficient documentation

## 2011-02-18 DIAGNOSIS — L02419 Cutaneous abscess of limb, unspecified: Secondary | ICD-10-CM | POA: Insufficient documentation

## 2011-02-18 DIAGNOSIS — L97809 Non-pressure chronic ulcer of other part of unspecified lower leg with unspecified severity: Secondary | ICD-10-CM | POA: Insufficient documentation

## 2011-02-18 DIAGNOSIS — D6859 Other primary thrombophilia: Secondary | ICD-10-CM | POA: Insufficient documentation

## 2011-02-18 DIAGNOSIS — I739 Peripheral vascular disease, unspecified: Secondary | ICD-10-CM | POA: Insufficient documentation

## 2011-02-18 DIAGNOSIS — I10 Essential (primary) hypertension: Secondary | ICD-10-CM | POA: Insufficient documentation

## 2011-02-18 DIAGNOSIS — G589 Mononeuropathy, unspecified: Secondary | ICD-10-CM | POA: Insufficient documentation

## 2011-02-18 DIAGNOSIS — Z7901 Long term (current) use of anticoagulants: Secondary | ICD-10-CM | POA: Insufficient documentation

## 2011-02-18 DIAGNOSIS — A4902 Methicillin resistant Staphylococcus aureus infection, unspecified site: Secondary | ICD-10-CM | POA: Insufficient documentation

## 2011-02-18 DIAGNOSIS — F410 Panic disorder [episodic paroxysmal anxiety] without agoraphobia: Secondary | ICD-10-CM | POA: Insufficient documentation

## 2011-02-18 DIAGNOSIS — Z79899 Other long term (current) drug therapy: Secondary | ICD-10-CM | POA: Insufficient documentation

## 2011-02-23 ENCOUNTER — Telehealth: Payer: Self-pay | Admitting: *Deleted

## 2011-02-23 NOTE — Telephone Encounter (Signed)
Patient requesting to try Wellbutrin to help her stop smoking.

## 2011-02-24 MED ORDER — BUPROPION HCL ER (XL) 150 MG PO TB24: 150.0000 mg | ORAL_TABLET | ORAL | Status: DC

## 2011-02-24 NOTE — Telephone Encounter (Signed)
Patient informed. 

## 2011-02-24 NOTE — Telephone Encounter (Signed)
done

## 2011-03-04 ENCOUNTER — Telehealth: Payer: Self-pay | Admitting: *Deleted

## 2011-03-04 DIAGNOSIS — F411 Generalized anxiety disorder: Secondary | ICD-10-CM

## 2011-03-04 MED ORDER — ALPRAZOLAM 2 MG PO TABS: 2.0000 mg | ORAL_TABLET | Freq: Three times a day (TID) | ORAL | Status: DC | PRN

## 2011-03-04 NOTE — Telephone Encounter (Signed)
Patient requesting increase in xanax to help w/her anxiety. Currently she is taking 1 mg tid. She says she tried to make apt but unable due to MD being out of office.

## 2011-03-04 NOTE — Telephone Encounter (Signed)
-  pls call in the new dose

## 2011-03-04 NOTE — Telephone Encounter (Signed)
Patient informed, Rx CALLED IN

## 2011-03-08 ENCOUNTER — Ambulatory Visit: Payer: Medicaid Other | Admitting: Internal Medicine

## 2011-03-15 ENCOUNTER — Ambulatory Visit: Payer: Medicaid Other | Admitting: Internal Medicine

## 2011-03-18 ENCOUNTER — Encounter (HOSPITAL_BASED_OUTPATIENT_CLINIC_OR_DEPARTMENT_OTHER): Payer: Medicaid Other | Attending: Internal Medicine

## 2011-03-18 DIAGNOSIS — Z79899 Other long term (current) drug therapy: Secondary | ICD-10-CM | POA: Insufficient documentation

## 2011-03-18 DIAGNOSIS — F410 Panic disorder [episodic paroxysmal anxiety] without agoraphobia: Secondary | ICD-10-CM | POA: Insufficient documentation

## 2011-03-18 DIAGNOSIS — I872 Venous insufficiency (chronic) (peripheral): Secondary | ICD-10-CM | POA: Insufficient documentation

## 2011-03-18 DIAGNOSIS — Z7901 Long term (current) use of anticoagulants: Secondary | ICD-10-CM | POA: Insufficient documentation

## 2011-03-18 DIAGNOSIS — L97809 Non-pressure chronic ulcer of other part of unspecified lower leg with unspecified severity: Secondary | ICD-10-CM | POA: Insufficient documentation

## 2011-03-18 DIAGNOSIS — Z86718 Personal history of other venous thrombosis and embolism: Secondary | ICD-10-CM | POA: Insufficient documentation

## 2011-03-18 DIAGNOSIS — I739 Peripheral vascular disease, unspecified: Secondary | ICD-10-CM | POA: Insufficient documentation

## 2011-03-18 DIAGNOSIS — L02419 Cutaneous abscess of limb, unspecified: Secondary | ICD-10-CM | POA: Insufficient documentation

## 2011-03-18 DIAGNOSIS — G589 Mononeuropathy, unspecified: Secondary | ICD-10-CM | POA: Insufficient documentation

## 2011-03-18 DIAGNOSIS — I10 Essential (primary) hypertension: Secondary | ICD-10-CM | POA: Insufficient documentation

## 2011-03-18 DIAGNOSIS — A4902 Methicillin resistant Staphylococcus aureus infection, unspecified site: Secondary | ICD-10-CM | POA: Insufficient documentation

## 2011-03-18 DIAGNOSIS — D6859 Other primary thrombophilia: Secondary | ICD-10-CM | POA: Insufficient documentation

## 2011-03-25 ENCOUNTER — Ambulatory Visit (INDEPENDENT_AMBULATORY_CARE_PROVIDER_SITE_OTHER)
Admission: RE | Admit: 2011-03-25 | Discharge: 2011-03-25 | Disposition: A | Discharge status: Home / Self Care | Payer: Medicaid Other | Source: Ambulatory Visit | Attending: Internal Medicine | Admitting: Internal Medicine

## 2011-03-25 ENCOUNTER — Ambulatory Visit (INDEPENDENT_AMBULATORY_CARE_PROVIDER_SITE_OTHER): Payer: Medicaid Other | Admitting: Internal Medicine

## 2011-03-25 ENCOUNTER — Encounter: Payer: Self-pay | Admitting: Internal Medicine

## 2011-03-25 VITALS — BP 122/80 | HR 86 | Temp 98.4°F | Resp 16 | Wt 177.0 lb

## 2011-03-25 DIAGNOSIS — J449 Chronic obstructive pulmonary disease, unspecified: Secondary | ICD-10-CM | POA: Insufficient documentation

## 2011-03-25 DIAGNOSIS — R05 Cough: Secondary | ICD-10-CM

## 2011-03-25 DIAGNOSIS — J42 Unspecified chronic bronchitis: Secondary | ICD-10-CM

## 2011-03-25 DIAGNOSIS — I10 Essential (primary) hypertension: Secondary | ICD-10-CM

## 2011-03-25 DIAGNOSIS — F411 Generalized anxiety disorder: Secondary | ICD-10-CM

## 2011-03-25 MED ORDER — TIOTROPIUM BROMIDE MONOHYDRATE 18 MCG IN CAPS: 18.0000 ug | ORAL_CAPSULE | Freq: Every day | RESPIRATORY_TRACT | Status: DC

## 2011-03-25 MED ORDER — ALPRAZOLAM 2 MG PO TABS: 2.0000 mg | ORAL_TABLET | Freq: Three times a day (TID) | ORAL | Status: DC | PRN

## 2011-03-25 MED ORDER — SERTRALINE HCL 50 MG PO TABS: 50.0000 mg | ORAL_TABLET | Freq: Every day | ORAL | Status: DC

## 2011-03-25 NOTE — Assessment & Plan Note (Signed)
I don't think she has an infection because she has not improved on doxy, I will check a cxr today to pna, mass, edema, etc, I think she has chronic bronchitis from tobacco abuse so I asked her to stop smoking

## 2011-03-25 NOTE — Progress Notes (Signed)
Subjective:    Patient ID: Brooke Garcia, female    DOB: [birth year], 55 y.o.   MRN: [id number]  Cough This is a chronic problem. The current episode started more than 1 year ago. The problem has been gradually worsening. The problem occurs every few hours. The cough is productive of brown sputum. Associated symptoms include shortness of breath. Pertinent negatives include no chest pain, chills, ear congestion, ear pain, fever, headaches, heartburn, hemoptysis, myalgias, nasal congestion, postnasal drip, rash, rhinorrhea, sore throat, sweats, weight loss or wheezing. The symptoms are aggravated by fumes. Risk factors for lung disease include smoking/tobacco exposure. Treatments tried: antibiotics - doxycycline. The treatment provided no relief. Her past medical history is significant for bronchitis. There is no history of asthma, bronchiectasis, COPD, emphysema, environmental allergies or pneumonia.      Review of Systems  Constitutional: Negative for fever, chills, weight loss, diaphoresis, activity change, appetite change, fatigue and unexpected weight change.  HENT: Negative for ear pain, nosebleeds, congestion, sore throat, facial swelling, rhinorrhea, sneezing, drooling, mouth sores, trouble swallowing, neck pain, neck stiffness, dental problem, voice change, postnasal drip, sinus pressure, tinnitus and ear discharge.   Eyes: Negative for photophobia and visual disturbance.  Respiratory: Positive for cough and shortness of breath. Negative for apnea, hemoptysis, choking, chest tightness, wheezing and stridor.   Cardiovascular: Negative for chest pain, palpitations and leg swelling.  Gastrointestinal: Negative for heartburn, nausea, vomiting, abdominal pain, constipation, blood in stool, abdominal distention and anal bleeding.  Genitourinary: Negative.   Musculoskeletal: Negative for myalgias, back pain, joint swelling, arthralgias and gait problem.  Skin: Negative for color change, pallor, rash  and wound.  Neurological: Negative for dizziness, tremors, seizures, syncope, facial asymmetry, speech difficulty, weakness, light-headedness, numbness and headaches.  Hematological: Negative for environmental allergies and adenopathy. Does not bruise/bleed easily.  Psychiatric/Behavioral: Positive for dysphoric mood. Negative for suicidal ideas, hallucinations, behavioral problems, confusion, sleep disturbance, self-injury, decreased concentration and agitation. The patient is nervous/anxious. The patient is not hyperactive.        Objective:   Physical Exam  Vitals reviewed. Constitutional: She is oriented to person, place, and time. She appears well-developed and well-nourished. No distress.  HENT:  Head: No trismus in the jaw.  Mouth/Throat: Uvula is midline and mucous membranes are normal. Mucous membranes are not pale, not dry and not cyanotic. She does not have dentures. No oral lesions. Normal dentition. No dental abscesses, uvula swelling, lacerations or dental caries. Posterior oropharyngeal erythema present. No oropharyngeal exudate, posterior oropharyngeal edema or tonsillar abscesses.  Eyes: Conjunctivae and EOM are normal. Pupils are equal, round, and reactive to light. Right eye exhibits no discharge. Left eye exhibits no discharge. No scleral icterus.  Neck: Normal range of motion. Neck supple. No JVD present. No tracheal deviation present. No thyromegaly present.  Cardiovascular: Normal rate, regular rhythm, normal heart sounds and intact distal pulses.  Exam reveals no gallop and no friction rub.   No murmur heard. Pulmonary/Chest: Effort normal and breath sounds normal. No stridor. No respiratory distress. She has no wheezes. She has no rales. She exhibits no tenderness.  Abdominal: Soft. Bowel sounds are normal. She exhibits no distension and no mass. There is no tenderness. There is no rebound and no guarding.  Musculoskeletal: Normal range of motion. She exhibits no edema  and no tenderness.  Lymphadenopathy:    She has no cervical adenopathy.  Neurological: She is alert and oriented to person, place, and time. She has normal reflexes. She displays normal reflexes. No cranial  nerve deficit. She exhibits normal muscle tone. Coordination normal.  Skin: Skin is warm and dry. No rash noted. She is not diaphoretic. No erythema. No pallor.  Psychiatric: She has a normal mood and affect. Her behavior is normal. Judgment and thought content normal.          Assessment & Plan:

## 2011-03-25 NOTE — Assessment & Plan Note (Signed)
Her BP is well controlled 

## 2011-03-25 NOTE — Assessment & Plan Note (Signed)
d'c wellbutrin and start sertraline, continue xanax prn

## 2011-03-25 NOTE — Assessment & Plan Note (Signed)
Start spiriva

## 2011-03-25 NOTE — Patient Instructions (Signed)
Chronic Bronchitis and Emphysema Exacerbation (Worsening)   You have chronic obstructive lung disease which has gotten worse. This can be an increase in your cough, wheezing, or shortness of breath. These problems are often worse during periods of air pollution or if you are exposed to smoke.   HOME CARE INSTRUCTIONS   You should avoid all substances which irritate the airway, especially tobacco smoke.   If you are a smoker, consider using nicotine gum or patches to quit. Quitting smoking is very important to prevent worsening of this disease.   Increasing oral fluids and using a cool air vaporizer can help thin bronchial secretions. This makes it easier to clear your chest when you cough.   If you have a home nebulizer and oxygen, continue to use them as directed.   The following medications may be prescribed to help you depending on what your caregiver finds today.   Antibiotics.   Bronchodilators (inhaled or tablets).   Cortisone medicines (inhaled or tablets).   It is important to use good technique with inhaled medications, and spacer devices may be needed to help improve drug delivery. Chronic lung disease is frequently severe and hospital care may be needed. Be sure to follow-up as recommended with your primary caregiver.   SEEK IMMEDIATE MEDICAL CARE IF YOU DEVELOP:   Extreme shortness of breath.   Severe chest pain or blood in your sputum.   A high fever, weakness, repeated vomiting, or fainting.   Confusion.   Take all medications as directed.   MAKE SURE YOU:   Understand these instructions.   Will watch your condition.   Will get help right away if you are not doing well or get worse.   Document Released: 06/27/2007 Document Re-Released: 08/12/2008   Exit[employer] Patient Information 2011 [employer].

## 2011-03-26 ENCOUNTER — Telehealth: Payer: Self-pay | Admitting: *Deleted

## 2011-03-26 DIAGNOSIS — F17200 Nicotine dependence, unspecified, uncomplicated: Secondary | ICD-10-CM

## 2011-03-26 MED ORDER — VARENICLINE TARTRATE 0.5 MG X 11 & 1 MG X 42 PO MISC: ORAL | Status: DC

## 2011-03-26 NOTE — Telephone Encounter (Signed)
Pt informed

## 2011-03-26 NOTE — Telephone Encounter (Signed)
Pt requesting medication to help her quit smoking. Please Advise

## 2011-03-26 NOTE — Telephone Encounter (Signed)
done

## 2011-03-29 ENCOUNTER — Other Ambulatory Visit: Payer: Self-pay | Admitting: *Deleted

## 2011-03-29 DIAGNOSIS — F17200 Nicotine dependence, unspecified, uncomplicated: Secondary | ICD-10-CM

## 2011-03-29 MED ORDER — VARENICLINE TARTRATE 0.5 MG X 11 & 1 MG X 42 PO MISC: ORAL | Status: DC

## 2011-03-29 MED ORDER — VARENICLINE TARTRATE 1 MG PO TABS: 1.0000 mg | ORAL_TABLET | Freq: Two times a day (BID) | ORAL | Status: DC

## 2011-04-15 ENCOUNTER — Encounter (HOSPITAL_BASED_OUTPATIENT_CLINIC_OR_DEPARTMENT_OTHER): Payer: Medicaid Other | Attending: Internal Medicine

## 2011-04-15 DIAGNOSIS — I872 Venous insufficiency (chronic) (peripheral): Secondary | ICD-10-CM | POA: Insufficient documentation

## 2011-04-15 DIAGNOSIS — I10 Essential (primary) hypertension: Secondary | ICD-10-CM | POA: Insufficient documentation

## 2011-04-15 DIAGNOSIS — I739 Peripheral vascular disease, unspecified: Secondary | ICD-10-CM | POA: Insufficient documentation

## 2011-04-15 DIAGNOSIS — Z79899 Other long term (current) drug therapy: Secondary | ICD-10-CM | POA: Insufficient documentation

## 2011-04-15 DIAGNOSIS — G589 Mononeuropathy, unspecified: Secondary | ICD-10-CM | POA: Insufficient documentation

## 2011-04-15 DIAGNOSIS — L97209 Non-pressure chronic ulcer of unspecified calf with unspecified severity: Secondary | ICD-10-CM | POA: Insufficient documentation

## 2011-04-21 ENCOUNTER — Other Ambulatory Visit: Payer: Self-pay | Admitting: Internal Medicine

## 2011-04-22 NOTE — Telephone Encounter (Signed)
Faxed rx to pharm

## 2011-05-05 ENCOUNTER — Telehealth: Payer: Self-pay | Admitting: *Deleted

## 2011-05-05 MED ORDER — TIOTROPIUM BROMIDE MONOHYDRATE 18 MCG IN CAPS: 18.0000 ug | ORAL_CAPSULE | Freq: Every day | RESPIRATORY_TRACT | Status: DC

## 2011-05-05 NOTE — Telephone Encounter (Signed)
done

## 2011-05-05 NOTE — Telephone Encounter (Signed)
Pt advised of Rx/pharmacy 

## 2011-05-05 NOTE — Telephone Encounter (Signed)
Pt left VM - spririva is working well, ok for refill?

## 2011-05-11 ENCOUNTER — Ambulatory Visit (INDEPENDENT_AMBULATORY_CARE_PROVIDER_SITE_OTHER): Payer: Medicaid Other | Admitting: Internal Medicine

## 2011-05-11 ENCOUNTER — Ambulatory Visit (INDEPENDENT_AMBULATORY_CARE_PROVIDER_SITE_OTHER)
Admission: RE | Admit: 2011-05-11 | Discharge: 2011-05-11 | Disposition: A | Discharge status: Home / Self Care | Payer: Medicaid Other | Source: Ambulatory Visit | Attending: Internal Medicine | Admitting: Internal Medicine

## 2011-05-11 ENCOUNTER — Encounter: Payer: Self-pay | Admitting: Internal Medicine

## 2011-05-11 ENCOUNTER — Telehealth: Payer: Self-pay | Admitting: *Deleted

## 2011-05-11 VITALS — BP 140/82 | HR 74 | Temp 98.0°F | Resp 16 | Wt 177.5 lb

## 2011-05-11 DIAGNOSIS — M542 Cervicalgia: Secondary | ICD-10-CM

## 2011-05-11 DIAGNOSIS — S0993XA Unspecified injury of face, initial encounter: Secondary | ICD-10-CM

## 2011-05-11 DIAGNOSIS — S199XXA Unspecified injury of neck, initial encounter: Secondary | ICD-10-CM | POA: Insufficient documentation

## 2011-05-11 DIAGNOSIS — F411 Generalized anxiety disorder: Secondary | ICD-10-CM

## 2011-05-11 DIAGNOSIS — Z1231 Encounter for screening mammogram for malignant neoplasm of breast: Secondary | ICD-10-CM | POA: Insufficient documentation

## 2011-05-11 DIAGNOSIS — I10 Essential (primary) hypertension: Secondary | ICD-10-CM

## 2011-05-11 DIAGNOSIS — J42 Unspecified chronic bronchitis: Secondary | ICD-10-CM

## 2011-05-11 DIAGNOSIS — F17200 Nicotine dependence, unspecified, uncomplicated: Secondary | ICD-10-CM

## 2011-05-11 MED ORDER — OXYCODONE HCL 5 MG PO TABS: 5.0000 mg | ORAL_TABLET | ORAL | Status: DC | PRN

## 2011-05-11 NOTE — Assessment & Plan Note (Signed)
Check a plain xray today to look for fracture, etc.

## 2011-05-11 NOTE — Patient Instructions (Signed)
Cervical and Neck Sprain and Strain (Neck Sprain and Strain) A cervical sprain is an injury to the neck. The injury can include either over-stretching or even small tears in the ligaments that hold the bones of the neck in place. A strain affects muscles and tendons. Minor injuries usually only involve ligaments and muscles. Because the different parts of the neck are so close together, more severe injuries can involve both sprain and strain. These injuries can affect the muscles, ligaments, tendons, discs, and nerves in the neck. SYMPTOMS  Pain, soreness, stiffness, or burning sensation in the front, back, or sides of the neck. This may develop immediately after injury. Onset of discomfort may also develop slowly and not begin for 24 hours or more.   Shoulder and/or upper back pain.   Limits to the normal movement of the neck.   Headache.   Dizziness.   Weakness and/or abnormal sensation (such as numbness or tingling) of one or both arms and/or hands.   Muscle spasm.   Difficulty with swallowing or chewing.   Tenderness and swelling at the injury site.  CAUSES An injury may be the result of a direct blow or from certain habits that can lead to the symptoms noted above.  Injury from:   Contact sports (such as football, rugby, wrestling, hockey, auto racing, gymnastics, diving, martial arts, and boxing).   Motor vehicle accidents.   Whiplash injuries (see image at right). These are common. They occur when the neck is forcefully whipped or forced backward and/or forward.   Falls.   Lifestyle or awkward postures:   Cradling a telephone between the ear and shoulder.   Sitting in a chair that offers no support.   Working at an ill-designed computer station.   Activities that require hours of repeated or long periods of looking up (stretching the neck backward) or looking down (bending the head/neck forward).  DIAGNOSIS  Most of the time, your caregiver can diagnose this  problem with a careful history and examination. The history will include information about known problems (such as arthritis in the neck) or a previous neck injury. X-rays may be ordered to find out if there is a different problem. X-rays can also help to find problems with the bones of the neck not related to the injury or current symptoms. TREATMENT Several treatment options are available to help pain, spasm, and other symptoms. They include:  Cold helps relieve pain and reduce inflammation. Cold should be applied for 10 to 15 minutes every 2 to 3 hours after any activity that aggravates your symptoms. Use ice packs or an ice massage. Place a towel or cloth in between your skin and the ice pack.   Medication:   Only take over-the-counter or prescription medicines for pain, discomfort, or fever as directed by your caregiver.   Pain relievers or muscle relaxants may be prescribed. Use only as directed and only as much as you need.   Change in the activity that caused the problem. This might include using a headset with a telephone so that the phone is not propped between your ear and shoulder.   Neck collar. Your caregiver may recommend temporary use of a soft cervical collar.   Work station. Changes may be needed in your work place. A better sitting position and/or better posture during work may be part of your treatment.   Physical Therapy. Your caregiver may recommend physical therapy. This can include instructions in the use of stretching and strengthening exercises. Improvement in   posture is important. Exercises and posture training can help stabilize the neck and strengthen muscles and keep symptoms from returning.  HOME CARE INSTRUCTIONS  Other than formal physical therapy, all treatments above can be done at home. Even when not at work, it is important to be conscious of your posture and of activities that can cause a return of symptoms. Most cervical sprains and/or strains are better in  1-3 weeks. As you improve and increase activities, doing a warm up and stretching before the activity will help prevent recurrent problems. SEEK MEDICAL CARE IF:   Pain is not effectively controlled with medication.   You feel unable to decrease pain medication over time as planned.   Activity level is not improving as planned and/or expected.  SEEK IMMEDIATE MEDICAL CARE IF:   While using medication, you develop any bleeding, stomach upset, or signs of an allergic reaction.   Symptoms get worse, become intolerable, and are not helped by medications.   New, unexplained symptoms develop.   You experience numbness, tingling, weakness, or paralysis of any part of your body.  MAKE SURE YOU:   Understand these instructions.   Will watch your condition.   Will get help right away if you are not doing well or get worse.  Document Released: 06/27/2007 Document Re-Released: 11/26/2008 [employer] Patient Information 2011 [employer]. 

## 2011-05-11 NOTE — Progress Notes (Signed)
Subjective:    Patient ID: Brooke Garcia, female    DOB: March 28, 1956, 55 y.o.   MRN: [id number]  Neck Injury  This is a new problem. The current episode started in the past 7 days. The problem occurs intermittently. The problem has been unchanged. The pain is associated with a fall. The pain is present in the right side. The quality of the pain is described as shooting. The pain is at a severity of 7/10. The pain is moderate. The symptoms are aggravated by twisting. The pain is same all the time. Stiffness is present all day. Pertinent negatives include no chest pain, fever, headaches, leg pain, numbness, pain with swallowing, paresis, photophobia, syncope, tingling, trouble swallowing, visual change, weakness or weight loss. She has tried nothing for the symptoms.      Review of Systems  Constitutional: Negative for fever, chills, weight loss, diaphoresis, activity change, appetite change, fatigue and unexpected weight change.  HENT: Positive for neck pain and neck stiffness. Negative for ear pain, nosebleeds, sore throat, facial swelling, drooling, trouble swallowing, dental problem, voice change and tinnitus.   Eyes: Negative for photophobia.  Respiratory: Negative for apnea, cough, choking, chest tightness, shortness of breath, wheezing and stridor.   Cardiovascular: Negative for chest pain, palpitations, leg swelling and syncope.  Gastrointestinal: Negative.  Negative for nausea, vomiting, abdominal pain, diarrhea and blood in stool.  Genitourinary: Negative for dysuria, urgency, frequency, hematuria, flank pain, decreased urine volume, enuresis, difficulty urinating and dyspareunia.  Musculoskeletal: Negative for myalgias, back pain, joint swelling and gait problem.  Skin: Negative for color change, pallor, rash and wound.  Neurological: Negative for dizziness, tingling, tremors, seizures, syncope, facial asymmetry, speech difficulty, weakness, light-headedness, numbness and headaches.    Hematological: Negative for adenopathy. Does not bruise/bleed easily.  Psychiatric/Behavioral: Positive for dysphoric mood (feels sad and depressed). Negative for suicidal ideas, hallucinations, behavioral problems, confusion, sleep disturbance, self-injury, decreased concentration and agitation. The patient is nervous/anxious. The patient is not hyperactive.        Objective:   Physical Exam  Vitals reviewed. Constitutional: She appears well-developed and well-nourished. No distress.  HENT:  Head: Normocephalic and atraumatic.  Nose: Nose normal.  Mouth/Throat: Oropharynx is clear and moist. No oropharyngeal exudate.  Eyes: Conjunctivae are normal. Right eye exhibits no discharge. Left eye exhibits no discharge. No scleral icterus.  Neck: Normal range of motion. Neck supple. No JVD present. No tracheal deviation present. No thyromegaly present.  Cardiovascular: Normal rate, regular rhythm, normal heart sounds and intact distal pulses.  Exam reveals no gallop and no friction rub.   No murmur heard. Pulmonary/Chest: Effort normal and breath sounds normal. No stridor. No respiratory distress. She has no wheezes. She has no rales. She exhibits no tenderness.  Abdominal: Soft. Bowel sounds are normal. She exhibits no distension and no mass. There is no tenderness. There is no rebound and no guarding.  Musculoskeletal: Normal range of motion. She exhibits no edema and no tenderness.       Cervical back: Normal. She exhibits normal range of motion, no tenderness, no bony tenderness, no swelling, no edema, no deformity, no laceration, no pain, no spasm and normal pulse.  Lymphadenopathy:    She has no cervical adenopathy.  Neurological: She is alert. She has normal strength. She displays no atrophy, no tremor and normal reflexes. No cranial nerve deficit or sensory deficit. She exhibits normal muscle tone. She displays a negative Romberg sign. She displays no seizure activity. Coordination and gait  normal. She displays  no Babinski's sign on the right side. She displays no Babinski's sign on the left side.  Reflex Scores:      Tricep reflexes are 1+ on the right side and 1+ on the left side.      Bicep reflexes are 1+ on the right side and 1+ on the left side.      Brachioradialis reflexes are 1+ on the right side and 1+ on the left side.      Patellar reflexes are 1+ on the right side and 1+ on the left side.      Achilles reflexes are 1+ on the right side and 1+ on the left side. Skin: Skin is dry. No rash noted. She is not diaphoretic. No erythema.  Psychiatric: Her speech is normal and behavior is normal. Judgment and thought content normal. Her mood appears anxious. Her affect is not angry, not blunt, not labile and not inappropriate. She is not aggressive, is not hyperactive, not slowed, not withdrawn, not actively hallucinating and not combative. Thought content is not paranoid and not delusional. Cognition and memory are normal. Cognition and memory are not impaired. She does not express impulsivity or inappropriate judgment. She does not exhibit a depressed mood. She expresses no homicidal and no suicidal ideation. She expresses no suicidal plans and no homicidal plans. She exhibits normal recent memory and normal remote memory. She is attentive.      Lab Results  Component Value Date   WBC 10.2 09/24/2010   HGB 15.6* 09/24/2010   HCT 44.8 09/24/2010   PLT 236.0 09/24/2010   CHOL 188 07/04/2007   TRIG 52 07/04/2007   HDL 66.6 07/04/2007   ALT 38* 03/15/2010   AST 33 03/15/2010   NA 140 09/24/2010   K 4.1 09/24/2010   CL 103 09/24/2010   CREATININE 0.7 09/24/2010   BUN 10 09/24/2010   CO2 27 09/24/2010   TSH 1.56 07/04/2007   INR 1.0 09/24/2010      Assessment & Plan:

## 2011-05-11 NOTE — Assessment & Plan Note (Signed)
Try oxycodone for pain

## 2011-05-11 NOTE — Telephone Encounter (Signed)
Patient requesting results of xrays.

## 2011-05-11 NOTE — Assessment & Plan Note (Signed)
BP is well controlled 

## 2011-05-11 NOTE — Assessment & Plan Note (Signed)
She wants to see a psychiatrist for a second opinion about zoloft so I have done a referral

## 2011-05-11 NOTE — Assessment & Plan Note (Signed)
She will continue with spiriva and will quit smoking using nicotine patches

## 2011-05-12 NOTE — Telephone Encounter (Signed)
Can she takes celebrex? I know she lists naproxen as an allergy.

## 2011-05-12 NOTE — Telephone Encounter (Signed)
+   for arthritis but no neck injury or fracture

## 2011-05-12 NOTE — Telephone Encounter (Signed)
Patient notified per MD. Per pt she would like to know if there is any other things that she can do in addition to med to help reduce pain/inflammation for arthritis.

## 2011-05-14 MED ORDER — CELECOXIB 100 MG PO CAPS: 100.0000 mg | ORAL_CAPSULE | Freq: Every day | ORAL | Status: DC

## 2011-05-14 NOTE — Telephone Encounter (Signed)
Rx sent in

## 2011-05-14 NOTE — Telephone Encounter (Signed)
Pt states she has never taken Celebrex and does not think she will have a problem with it. She says that Naproxen causes hives. Pt says that she is willing to try medication if MD thinks it is best.

## 2011-05-20 ENCOUNTER — Encounter (HOSPITAL_BASED_OUTPATIENT_CLINIC_OR_DEPARTMENT_OTHER): Payer: Medicaid Other

## 2011-05-27 ENCOUNTER — Ambulatory Visit (HOSPITAL_COMMUNITY): Payer: Medicaid Other

## 2011-06-04 ENCOUNTER — Ambulatory Visit (HOSPITAL_COMMUNITY): Payer: Medicaid Other

## 2011-06-07 LAB — URINALYSIS, ROUTINE W REFLEX MICROSCOPIC
Nitrite: NEGATIVE
Specific Gravity, Urine: 1.005
Urobilinogen, UA: 0.2
pH: 7.5

## 2011-06-07 LAB — PROTIME-INR
INR: 1.1
Prothrombin Time: 14.1

## 2011-06-07 LAB — CBC
Hemoglobin: 15.2 — ABNORMAL HIGH
MCHC: 35.9
RBC: 4.47

## 2011-06-07 LAB — BASIC METABOLIC PANEL
CO2: 23
Calcium: 9.1
Creatinine, Ser: 0.77
GFR calc Af Amer: >60
Glucose, Bld: 92

## 2011-06-07 LAB — SAMPLE TO BLOOD BANK

## 2011-06-10 ENCOUNTER — Ambulatory Visit (HOSPITAL_COMMUNITY): Payer: Medicaid Other

## 2011-06-11 ENCOUNTER — Telehealth: Payer: Self-pay

## 2011-06-11 DIAGNOSIS — M542 Cervicalgia: Secondary | ICD-10-CM

## 2011-06-11 DIAGNOSIS — S199XXA Unspecified injury of neck, initial encounter: Secondary | ICD-10-CM

## 2011-06-11 NOTE — Telephone Encounter (Signed)
Pt called stating she cannot find a Psychiatrist that accepts Medicaid and current medications are not controlling panic attacks. Pt is requesting a possible change in treatment, Xanax to Klonopin specifically. Pt is not experiencing any panic at this time.   Pt informed that MD is not in office until 06/14/2011 and is okay to wait his response.

## 2011-06-14 MED ORDER — CLONAZEPAM 1 MG PO TABS: 1.0000 mg | ORAL_TABLET | Freq: Three times a day (TID) | ORAL | Status: DC

## 2011-06-14 NOTE — Telephone Encounter (Signed)
Called in [facility] and spoke w/patient.   1. Wants RF of oxycodone 5 mg and celebrex 200 mg. Pt c/o neck pain and tingling in neck and shoulder. OK for RFs?  2. Zoloft has not improved panic attacks. Should she increase dose?

## 2011-06-14 NOTE — Telephone Encounter (Signed)
Call it in please.

## 2011-06-14 NOTE — Telephone Encounter (Signed)
Pt called again req response from MD regarding below, please advise.

## 2011-06-15 MED ORDER — OXYCODONE HCL 5 MG PO TABS: 5.0000 mg | ORAL_TABLET | ORAL | Status: DC | PRN

## 2011-06-15 MED ORDER — CELECOXIB 100 MG PO CAPS: 200.0000 mg | ORAL_CAPSULE | Freq: Every day | ORAL | Status: DC

## 2011-06-15 NOTE — Telephone Encounter (Signed)
OK for # 1? What about #2?

## 2011-06-15 NOTE — Telephone Encounter (Signed)
ok 

## 2011-06-15 NOTE — Telephone Encounter (Signed)
yes

## 2011-06-15 NOTE — Telephone Encounter (Signed)
#  1.RF pending signature  #2 She should increase zoloft to what MG?

## 2011-06-16 NOTE — Telephone Encounter (Signed)
Scheduled for OV Friday to discuss

## 2011-06-17 ENCOUNTER — Ambulatory Visit: Payer: Medicaid Other | Admitting: Internal Medicine

## 2011-06-17 ENCOUNTER — Other Ambulatory Visit (INDEPENDENT_AMBULATORY_CARE_PROVIDER_SITE_OTHER): Payer: Medicaid Other

## 2011-06-17 ENCOUNTER — Encounter: Payer: Self-pay | Admitting: Internal Medicine

## 2011-06-17 ENCOUNTER — Ambulatory Visit (INDEPENDENT_AMBULATORY_CARE_PROVIDER_SITE_OTHER)
Admission: RE | Admit: 2011-06-17 | Discharge: 2011-06-17 | Disposition: A | Discharge status: Home / Self Care | Payer: Medicaid Other | Source: Ambulatory Visit | Attending: Internal Medicine | Admitting: Internal Medicine

## 2011-06-17 ENCOUNTER — Ambulatory Visit (INDEPENDENT_AMBULATORY_CARE_PROVIDER_SITE_OTHER): Payer: Medicaid Other | Admitting: Internal Medicine

## 2011-06-17 DIAGNOSIS — S199XXA Unspecified injury of neck, initial encounter: Secondary | ICD-10-CM

## 2011-06-17 DIAGNOSIS — M542 Cervicalgia: Secondary | ICD-10-CM

## 2011-06-17 DIAGNOSIS — I82409 Acute embolism and thrombosis of unspecified deep veins of unspecified lower extremity: Secondary | ICD-10-CM

## 2011-06-17 DIAGNOSIS — Z7901 Long term (current) use of anticoagulants: Secondary | ICD-10-CM

## 2011-06-17 DIAGNOSIS — S0993XA Unspecified injury of face, initial encounter: Secondary | ICD-10-CM

## 2011-06-17 DIAGNOSIS — D6859 Other primary thrombophilia: Secondary | ICD-10-CM

## 2011-06-17 DIAGNOSIS — F411 Generalized anxiety disorder: Secondary | ICD-10-CM

## 2011-06-17 DIAGNOSIS — IMO0002 Reserved for concepts with insufficient information to code with codable children: Secondary | ICD-10-CM

## 2011-06-17 LAB — PROTIME-INR: Prothrombin Time: 9.5 s — ABNORMAL LOW (ref 10.2–12.4)

## 2011-06-17 LAB — CBC WITH DIFFERENTIAL/PLATELET
Basophils Absolute: 0 10*3/uL (ref 0.0–0.1)
Eosinophils Absolute: 0.2 10*3/uL (ref 0.0–0.7)
HCT: 45.5 % (ref 36.0–46.0)
Lymphs Abs: 3.7 10*3/uL (ref 0.7–4.0)
MCHC: 34.4 g/dL (ref 30.0–36.0)
MCV: 98.8 fl (ref 78.0–100.0)
Monocytes Absolute: 0.1 10*3/uL (ref 0.1–1.0)
Platelets: 257 10*3/uL (ref 150.0–400.0)
RDW: 13.1 % (ref 11.5–14.6)

## 2011-06-17 MED ORDER — OXYCODONE HCL 5 MG PO TABS: 5.0000 mg | ORAL_TABLET | ORAL | Status: AC | PRN

## 2011-06-17 MED ORDER — CELECOXIB 200 MG PO CAPS: 200.0000 mg | ORAL_CAPSULE | Freq: Every day | ORAL | Status: AC

## 2011-06-17 MED ORDER — SERTRALINE HCL 100 MG PO TABS: 100.0000 mg | ORAL_TABLET | Freq: Every day | ORAL | Status: DC

## 2011-06-17 MED ORDER — CLONAZEPAM 2 MG PO TABS: 2.0000 mg | ORAL_TABLET | Freq: Two times a day (BID) | ORAL | Status: AC | PRN

## 2011-06-17 NOTE — Patient Instructions (Signed)
Degenerative Disc Disease     Degenerative disc disease is a condition caused by the changes that occur in the spinal discs (cushions of the backbone) as you grow older. Spinal discs are soft and compressible discs located between the vertebrae (bones of the spine). They act like shock absorbers. Degenerative disc disease can affect the whole of the spine. However, the neck and the lower part of the back are most commonly affected. Many changes can occur in the spinal discs with aging. They are:   Spinal discs may dry out and shrink.   Small tears can occur in the annulus (tough outer covering of the disc).    The disc space can become smaller due to loss of water.    Bone spurs (abnormal growth in the bone) can occur. This can cause pressure on the nerve roots exiting the spinal canal leading to pain.    Spinal canal may get narrowed.      Degenerative disc disease causes neck and back pain. This condition is treated by applying heat or ice and by taking medicines to relieve the pain. Your caregiver may suggest surgery, if the pain does not improve by these methods.      CAUSES  Degenerative disc disease is a condition caused by the changes that occur in the spinal discs with aging. The exact cause is not known but there is a genetic basis for many patients. Degenerative changes can occur due to loss of fluid in the disc. This makes the disc thinner and reduces the space between the backbones. Small cracks can develop in the outer layer of the disc. This can lead to the breakdown of the disc. You are more likely to get degenerative disc disease if you are overweight. Smoking cigarettes and doing heavy work such as weight lifting can also increase your risk of this condition. Degenerative changes can start after a sudden injury. Growth of bone spurs can compress the nerve roots and cause pain.       SYMPTOMS  The symptoms vary from person to person. Some people may have no pain, while others have severe pain.  The pain may be so severe that it can limit your activities. The location of the pain depends on the part of your backbone that is affected. You will have neck or arm pain if a disc in the neck region is affected. You will have pain in your back, buttocks, or legs if the disc of the lower back is affected. The pain becomes worse while bending, reaching up, or with twisting movements. The pain may start gradually and then get worse as time passes. It may also start after a major or minor injury. You may also feel numbness or tingling in the arms or legs.      DIAGNOSIS  Your caregiver will ask you about your symptoms and activities or habits that may cause the pain. He may also ask about any injuries, diseases or any treatments that you have had earlier. Your caregiver will examine you to check for the range of movement that is possible in the affected area, check for strength in your extremities and sensation in the areas supplied by different nerve roots supplying the arms and legs. X-ray of the spine may be included. Your caregiver may suggest other imaging tests, such as an MRI, if needed.      TREATMENT  The treatment includes rest, modifying your activities, and applying ice and heat. Your caregiver may prescribe medicines to reduce   your pain and ask you to do some exercises to strengthen your back. Sometimes, you may need surgery. You and your caregiver will decide on the treatment that is best for you.     HOME CARE INSTRUCTIONS   Follow the proper lifting and walking techniques as advised by your caregiver.   Maintain good posture.    Exercise regularly as advised.   Perform relaxation exercise.   Alter your habit of sitting, standing, and sleeping as advised. Change positions frequently.   Lose weight.    Stop smoking.   Use supportive footwear.     SEEK MEDICAL CARE IF:   The pain does not go away within 1-4 weeks.     SEEK IMMEDIATE MEDICAL CARE IF:   The pain is severe.   You notice weakness in  your arms, hands, or legs.   You begin to lose control of your bladder or bowel.     MAKE SURE YOU:    Understand these instructions.    Will watch your condition.   Will get help right away if you are not doing well or get worse.     Document Released: 06/27/2007  Document Re-Released: 08/12/2008  [employer] Patient Information 2011 [employer].

## 2011-06-17 NOTE — Assessment & Plan Note (Signed)
There is no evidence of recurrence today 

## 2011-06-17 NOTE — Assessment & Plan Note (Signed)
Continue current meds 

## 2011-06-17 NOTE — Assessment & Plan Note (Addendum)
Check PT/INR today and refer to coumadin clinic for ongoing PT monitoring

## 2011-06-17 NOTE — Progress Notes (Signed)
Subjective:    Patient ID: Brooke Garcia, female    DOB: 03/22/1956, 55 y.o.   MRN: [id number]  Neck Injury  This is a recurrent problem. Episode onset: she fell one week ago and twisted her neck. The problem occurs intermittently. The problem has been gradually improving. The pain is associated with a fall. The quality of the pain is described as aching. The pain is at a severity of 6/10. The pain is moderate. The symptoms are aggravated by position. The pain is worse during the day. Stiffness is present all day. Pertinent negatives include no chest pain, fever, headaches, leg pain, numbness, pain with swallowing, paresis, photophobia, syncope, tingling, trouble swallowing, visual change, weakness or weight loss. She has tried NSAIDs and oral narcotics for the symptoms. The treatment provided moderate relief.      Review of Systems  Constitutional: Negative for fever, chills, weight loss, diaphoresis, activity change, appetite change, fatigue and unexpected weight change.  HENT: Positive for neck pain and neck stiffness. Negative for hearing loss, sore throat, facial swelling, trouble swallowing, voice change and tinnitus.   Eyes: Negative for photophobia, pain, discharge, redness, itching and visual disturbance.  Respiratory: Negative for apnea, cough, choking, chest tightness, shortness of breath, wheezing and stridor.   Cardiovascular: Negative for chest pain, palpitations, leg swelling and syncope.  Gastrointestinal: Negative for nausea, vomiting, abdominal pain, diarrhea, constipation, abdominal distention and anal bleeding.  Genitourinary: Negative for dysuria, urgency, frequency, hematuria, flank pain, decreased urine volume, enuresis, difficulty urinating and dyspareunia.  Musculoskeletal: Negative for myalgias, back pain, joint swelling, arthralgias and gait problem.  Skin: Negative for color change, pallor, rash and wound.  Neurological: Negative for dizziness, tingling, tremors,  seizures, syncope, facial asymmetry, speech difficulty, weakness, light-headedness, numbness and headaches.  Hematological: Negative for adenopathy. Does not bruise/bleed easily.  Psychiatric/Behavioral: Negative for suicidal ideas, hallucinations, behavioral problems, confusion, sleep disturbance, self-injury, dysphoric mood, decreased concentration and agitation. The patient is nervous/anxious (she wants to try a higher dose of klonopin and zoloft). The patient is not hyperactive.        Objective:   Physical Exam  Vitals reviewed. Constitutional: She is oriented to person, place, and time. She appears well-developed and well-nourished. No distress.  HENT:  Head: Normocephalic and atraumatic.  Mouth/Throat: Oropharynx is clear and moist. No oropharyngeal exudate.  Eyes: Conjunctivae and EOM are normal. Pupils are equal, round, and reactive to light. Right eye exhibits no discharge. Left eye exhibits no discharge. No scleral icterus.  Neck: Normal range of motion. Neck supple. No JVD present. No tracheal deviation present. No thyromegaly present.  Cardiovascular: Normal rate, regular rhythm, normal heart sounds and intact distal pulses.  Exam reveals no gallop and no friction rub.   No murmur heard. Pulmonary/Chest: Effort normal and breath sounds normal. No stridor. No respiratory distress. She has no wheezes. She has no rales. She exhibits no tenderness.  Abdominal: Soft. Bowel sounds are normal. She exhibits no distension and no mass. There is no tenderness. There is no rebound and no guarding.  Musculoskeletal: Normal range of motion. She exhibits no edema and no tenderness.       Cervical back: Normal. She exhibits normal range of motion, no tenderness, no bony tenderness, no swelling, no edema, no deformity, no laceration, no pain, no spasm and normal pulse.  Lymphadenopathy:    She has no cervical adenopathy.  Neurological: She is alert and oriented to person, place, and time. She has  normal strength. She displays no tremor and normal  reflexes. No cranial nerve deficit or sensory deficit. She exhibits normal muscle tone. She displays a negative Romberg sign. She displays no seizure activity. Coordination and gait normal. She displays no Babinski's sign on the right side. She displays no Babinski's sign on the left side.  Reflex Scores:      Tricep reflexes are 0 on the right side and 0 on the left side.      Bicep reflexes are 0 on the right side.      Brachioradialis reflexes are 0 on the right side.      Patellar reflexes are 0 on the right side and 0 on the left side.      Achilles reflexes are 0 on the right side and 0 on the left side. Skin: Skin is warm and dry. No abrasion, no bruising, no burn, no ecchymosis, no laceration, no lesion and no rash noted. Rash is not maculopapular. She is not diaphoretic. No erythema. No pallor.  Psychiatric: She has a normal mood and affect. Her behavior is normal. Judgment and thought content normal.     Lab Results  Component Value Date   WBC 10.2 09/24/2010   HGB 15.6* 09/24/2010   HCT 44.8 09/24/2010   PLT 236.0 09/24/2010   GLUCOSE 94 09/24/2010   CHOL 188 07/04/2007   TRIG 52 07/04/2007   HDL 66.6 07/04/2007   LDLCALC 111* 07/04/2007   ALT 38* 03/15/2010   AST 33 03/15/2010   NA 140 09/24/2010   K 4.1 09/24/2010   CL 103 09/24/2010   CREATININE 0.7 09/24/2010   BUN 10 09/24/2010   CO2 27 09/24/2010   TSH 1.56 07/04/2007   INR 1.0 09/24/2010       Assessment & Plan:

## 2011-06-17 NOTE — Assessment & Plan Note (Signed)
Increase doses of klonopin and zoloft

## 2011-06-17 NOTE — Assessment & Plan Note (Signed)
Check plain films to look for fracture, etc.

## 2011-06-17 NOTE — Assessment & Plan Note (Signed)
Continue coumadin.  

## 2011-06-18 ENCOUNTER — Ambulatory Visit: Payer: Medicaid Other | Admitting: Internal Medicine

## 2011-06-18 ENCOUNTER — Telehealth: Payer: Self-pay | Admitting: *Deleted

## 2011-06-18 NOTE — Telephone Encounter (Signed)
Pt took one 2 mg klonopin last night. She could not sleep, had burning and tightness in her throat, scared to take any more. Patient requesting RX for Valium in place of klonopin. Please advise.

## 2011-06-18 NOTE — Telephone Encounter (Signed)
Patient called stating that she was given a new rx for klonopin 2mg . She took 1 tab last night and c/o burning throat, headache and unable to sleep. She would like to know if MD would switch medication for something different. Spoke with MD who advised that pt has had several different meds and does not wish to change again. Patient was given this info and advised to stop taking,  also stated that I would be glad to ask again but cannot guarerntee anything. I returned call to patient to wanted to advise her that she can possibly resume her xanax until she can come in for appt. No answer and bad connection.

## 2011-06-21 ENCOUNTER — Encounter: Payer: Medicaid Other | Admitting: *Deleted

## 2011-06-28 ENCOUNTER — Telehealth: Payer: Self-pay | Admitting: *Deleted

## 2011-06-28 NOTE — Telephone Encounter (Signed)
Patient requesting RF of oxycodone. She says there has been no improvement in her pain since last OV.

## 2011-06-29 NOTE — Telephone Encounter (Signed)
This sounds like the oxycodone is not working, ask her to f/up with me

## 2011-06-29 NOTE — Telephone Encounter (Signed)
Pt informed of below. She states the pain is improved when she takes the medication but she is out. She is c/o tingling and is asking if you think she needs a MRI. Please advise on Rf and further tests.

## 2011-06-30 MED ORDER — OXYCODONE HCL 5 MG PO TABS: 5.0000 mg | ORAL_TABLET | ORAL | Status: AC | PRN

## 2011-06-30 NOTE — Telephone Encounter (Signed)
Rx done, if she needs an MRI then she needs to be seen

## 2011-07-01 ENCOUNTER — Ambulatory Visit: Payer: Medicaid Other | Admitting: Internal Medicine

## 2011-07-01 NOTE — Telephone Encounter (Signed)
[facility] advising pt, rx placed upfront

## 2011-07-07 ENCOUNTER — Telehealth: Payer: Self-pay

## 2011-07-07 NOTE — Telephone Encounter (Signed)
Returned call to patient/[facility] for her to call back, need to advise per MD

## 2011-07-07 NOTE — Telephone Encounter (Signed)
Patient called lmovm stating that MD d/c klonopin and advised xanax and zoloft. Per patient this combonation seems to really work. She is in need a refill for her xanax 2mg . Please advise if ok

## 2011-07-07 NOTE — Telephone Encounter (Signed)
On 03/25/11 she got xanax #90 with 5 refills and the [facility] website shows that she has had 3 of those filled so it is too early for her to ask for a refill

## 2011-07-12 ENCOUNTER — Telehealth: Payer: Self-pay | Admitting: *Deleted

## 2011-07-12 NOTE — Telephone Encounter (Signed)
Pt left VM - she is requesting additional "scan" to see if her pain is from a "pinched nerve". See previous phone notes, pt needs OV to discuss further testing. Please help, she should have been told this already.   Jasmine December, can you call patient to explain the need for another OV so MD can discuss MRI/CT to look at her neck.

## 2011-07-12 NOTE — Telephone Encounter (Signed)
PT will call back this afternoon to schedule f/u appt after she speaks to son [transportation].

## 2011-07-16 ENCOUNTER — Telehealth: Payer: Self-pay

## 2011-07-16 MED ORDER — OXYCODONE HCL 5 MG PO TABS: 5.0000 mg | ORAL_TABLET | ORAL | Status: AC | PRN

## 2011-07-16 NOTE — Telephone Encounter (Signed)
Advised RX at front desk.  Advised pt we do not take [name] even with auth number.

## 2011-07-16 NOTE — Telephone Encounter (Signed)
done

## 2011-07-16 NOTE — Telephone Encounter (Signed)
Patient called stating that a CT scan was ordered for her, however facility advised her that she can not be seen until insurance/medicaid card is approved. Patient is requesting that MD refill her oxycodone temporarily until insurance issue is resolved. Please advise if ok to refill Thanks

## 2011-07-16 NOTE — Telephone Encounter (Signed)
Second call to triage.  Pt worried about running out of meds.  Pt states pain is getting worse, tingling down arm.  Pt having issues with Medicaid card and unable to come in until next week for eval for further testing.  Requesting refill on oxycodone.

## 2011-07-29 ENCOUNTER — Other Ambulatory Visit: Payer: Self-pay | Admitting: *Deleted

## 2011-07-29 MED ORDER — OXYCODONE HCL 5 MG PO TABS: 5.0000 mg | ORAL_TABLET | Freq: Three times a day (TID) | ORAL | Status: DC | PRN

## 2011-07-29 NOTE — Telephone Encounter (Signed)
Pt states she is still having trouble getting [name] changed on her insurance-can't get CT-requesting to get Rx early on Oxycodone for p/u.?

## 2011-07-29 NOTE — Telephone Encounter (Signed)
Pt informed ready for p/u.

## 2011-07-29 NOTE — Telephone Encounter (Signed)
done

## 2011-08-09 ENCOUNTER — Telehealth: Payer: Self-pay | Admitting: *Deleted

## 2011-08-09 DIAGNOSIS — M549 Dorsalgia, unspecified: Secondary | ICD-10-CM

## 2011-08-09 DIAGNOSIS — IMO0002 Reserved for concepts with insufficient information to code with codable children: Secondary | ICD-10-CM

## 2011-08-09 DIAGNOSIS — M542 Cervicalgia: Secondary | ICD-10-CM

## 2011-08-09 MED ORDER — OXYCODONE HCL 5 MG PO TABS: 5.0000 mg | ORAL_TABLET | Freq: Three times a day (TID) | ORAL | Status: DC | PRN

## 2011-08-09 NOTE — Telephone Encounter (Signed)
Request from Pt for another Rx for her Oxycodone [last 11.15.12 #[id number]] stating that she is suppose to have her Medicaid corrected next week and has made a OV appointment for 12.05.12.

## 2011-08-09 NOTE — Telephone Encounter (Signed)
Could not locate Rx-will check w/MD 11.27.12

## 2011-08-09 NOTE — Telephone Encounter (Signed)
done

## 2011-08-10 ENCOUNTER — Other Ambulatory Visit: Payer: Self-pay | Admitting: *Deleted

## 2011-08-10 ENCOUNTER — Encounter (HOSPITAL_COMMUNITY): Payer: Self-pay | Admitting: Emergency Medicine

## 2011-08-10 ENCOUNTER — Emergency Department (HOSPITAL_COMMUNITY)
Admission: EM | Admit: 2011-08-10 | Discharge: 2011-08-10 | Discharge status: Against Medical Advice | Payer: Medicaid Other | Attending: Emergency Medicine | Admitting: Emergency Medicine

## 2011-08-10 DIAGNOSIS — M542 Cervicalgia: Secondary | ICD-10-CM

## 2011-08-10 DIAGNOSIS — IMO0002 Reserved for concepts with insufficient information to code with codable children: Secondary | ICD-10-CM

## 2011-08-10 DIAGNOSIS — M549 Dorsalgia, unspecified: Secondary | ICD-10-CM

## 2011-08-10 DIAGNOSIS — L0291 Cutaneous abscess, unspecified: Secondary | ICD-10-CM | POA: Insufficient documentation

## 2011-08-10 MED ORDER — OXYCODONE HCL 5 MG PO TABS: 5.0000 mg | ORAL_TABLET | Freq: Three times a day (TID) | ORAL | Status: DC | PRN

## 2011-08-10 NOTE — ED Provider Notes (Signed)
This patient left AMA prior to any evaluation by me.  Gerhard Munch, MD 08/10/11 [phone]

## 2011-08-10 NOTE — ED Notes (Signed)
Pt states has had abcess underarm x 2 weeks. Pt states has been soaking area and self draining them.

## 2011-08-10 NOTE — Telephone Encounter (Signed)
Rx reprinted & ready for P/U Patient informed.

## 2011-08-10 NOTE — ED Provider Notes (Signed)
Patient left before being seen by myself or any other ED provider due to time constraint and her ride needing to leave per report to nursing staff.   [place], [place] 08/10/11 [id number]

## 2011-08-10 NOTE — ED Notes (Signed)
Pt left AMA because her ride was leaving for work. She reported that she will be back in the PM or tomorrow AM. Pts was in no acute distress when she left.

## 2011-08-11 ENCOUNTER — Telehealth: Payer: Self-pay | Admitting: *Deleted

## 2011-08-11 NOTE — Telephone Encounter (Signed)
Needs to be seen

## 2011-08-11 NOTE — Telephone Encounter (Signed)
Pt c/o coughing x3-4 days that is "keeping her up at night & having to sleep sitting up"; states Spiriva is not working & that she took [2] doses yesterday and still did not get relief. Pt states she does not think that she can wait until OV 12.05.12 to address this matter & request a different medication to be sent to her [facility]

## 2011-08-11 NOTE — Telephone Encounter (Signed)
Pt states that because of [name] she is unable to make a sooner appt ( she is waiting for Medicaid to update their system). Pt is requesting advisement until appt. She also wants MD to advise if it is okay for her to take Spiriva more Rx'd due to cough?

## 2011-08-18 ENCOUNTER — Ambulatory Visit: Payer: Medicaid Other | Admitting: Internal Medicine

## 2011-08-26 ENCOUNTER — Ambulatory Visit: Payer: Medicaid Other | Admitting: Internal Medicine

## 2011-09-21 ENCOUNTER — Other Ambulatory Visit: Payer: Self-pay | Admitting: Internal Medicine

## 2011-10-01 ENCOUNTER — Other Ambulatory Visit: Payer: Self-pay | Admitting: Internal Medicine

## 2011-10-03 ENCOUNTER — Other Ambulatory Visit: Payer: Self-pay | Admitting: Internal Medicine

## 2011-10-04 ENCOUNTER — Emergency Department (HOSPITAL_COMMUNITY)
Admission: EM | Admit: 2011-10-04 | Discharge: 2011-10-04 | Disposition: A | Discharge status: Home / Self Care | Payer: Medicaid Other | Attending: Emergency Medicine | Admitting: Emergency Medicine

## 2011-10-04 ENCOUNTER — Encounter (HOSPITAL_COMMUNITY): Payer: Self-pay | Admitting: Family Medicine

## 2011-10-04 DIAGNOSIS — Z7901 Long term (current) use of anticoagulants: Secondary | ICD-10-CM | POA: Insufficient documentation

## 2011-10-04 DIAGNOSIS — R609 Edema, unspecified: Secondary | ICD-10-CM | POA: Insufficient documentation

## 2011-10-04 DIAGNOSIS — M79609 Pain in unspecified limb: Secondary | ICD-10-CM | POA: Insufficient documentation

## 2011-10-04 DIAGNOSIS — I10 Essential (primary) hypertension: Secondary | ICD-10-CM | POA: Insufficient documentation

## 2011-10-04 DIAGNOSIS — F17200 Nicotine dependence, unspecified, uncomplicated: Secondary | ICD-10-CM | POA: Insufficient documentation

## 2011-10-04 DIAGNOSIS — M7989 Other specified soft tissue disorders: Secondary | ICD-10-CM

## 2011-10-04 DIAGNOSIS — I8000 Phlebitis and thrombophlebitis of superficial vessels of unspecified lower extremity: Secondary | ICD-10-CM

## 2011-10-04 LAB — COMPREHENSIVE METABOLIC PANEL
ALT: 23 U/L (ref 0–35)
CO2: 27 mEq/L (ref 19–32)
Calcium: 9.6 mg/dL (ref 8.4–10.5)
Chloride: 101 mEq/L (ref 96–112)
Creatinine, Ser: 0.8 mg/dL (ref 0.50–1.10)
GFR calc Af Amer: >90 mL/min (ref >90)
GFR calc non Af Amer: 81 mL/min — ABNORMAL LOW (ref >90)
Glucose, Bld: 109 mg/dL — ABNORMAL HIGH (ref 70–99)
Total Bilirubin: 0.4 mg/dL (ref 0.3–1.2)

## 2011-10-04 LAB — CBC
Hemoglobin: 14.7 g/dL (ref 12.0–15.0)
MCH: 31.9 pg (ref 26.0–34.0)
MCV: 92.6 fL (ref 78.0–100.0)
RBC: 4.61 MIL/uL (ref 3.87–5.11)

## 2011-10-04 MED ORDER — OXYCODONE-ACETAMINOPHEN 5-325 MG PO TABS
ORAL_TABLET | ORAL | Status: AC
  Administered 2011-10-04: 1
  Filled 2011-10-04: qty 1

## 2011-10-04 MED ORDER — OXYCODONE HCL 5 MG PO TABS: 5.0000 mg | ORAL_TABLET | ORAL | Status: AC | PRN

## 2011-10-04 NOTE — ED Notes (Signed)
Per Dr. Ranae Palms, pt can have oxycodone if she reports not allergy. Per pt, the wound clinic has prescribed oxycodone to her before and she can take that and she can take Dilaudid without difficulty. Dr. Ranae Palms advised, verbal order for oxycodone x 1 tab now.

## 2011-10-04 NOTE — ED Notes (Signed)
Dr. Ranae Palms requesting status of vascular study. Per vascular, report not in. Paged Scarlette Calico, Vascular tech at [phone] for status. Dr. Ranae Palms notified.

## 2011-10-04 NOTE — ED Notes (Signed)
Pt reports having pain to right calf starting yesterday with swelling, redness, and heat. States she has hx of blood clots and pain and redness have moved to upper extremity today. Reports feeling sob.

## 2011-10-04 NOTE — ED Notes (Signed)
Patient given discharge instructions, information, prescriptions, and diet order. Patient states that they adequately understand discharge information given and to return to ED if symptoms return or worsen.     

## 2011-10-04 NOTE — ED Provider Notes (Signed)
History     CSN: [id number]  Arrival date & time 10/04/11  1138   First MD Initiated Contact with Patient 10/04/11 1202      Chief Complaint  Patient presents with  . Leg Pain    history of blood clots    (Consider location/radiation/quality/duration/timing/severity/associated sxs/prior treatment) HPI Pt c/o RLE swelling pain and redness x 3 days. Pt has a history of DVT's in LLE and is on Coumadin but is inconsistent taking this med. She denies new chest pain or SOB. No fever.   Past Medical History  Diagnosis Date  . Tobacco use disorder   . Unspecified urinary incontinence   . Complete rupture of rotator cuff   . Acute venous embolism and thrombosis of unspecified deep vessels of lower extremity   . Calculus of kidney   . Degeneration of intervertebral disc, site unspecified   . Painful respiration   . Other and unspecified noninfectious gastroenteritis and colitis   . Essential hypertension, benign   . Family history of ischemic heart disease   . Anxiety state, unspecified   . Pure hypercholesterolemia   . Primary hypercoagulable state     Past Surgical History  Procedure Date  . Thrombectomy / embolectomy femoral artery     DVT  . Nasal sinus surgery   . Tubal ligation     Bilateral    Family History  Problem Relation Age of Onset  . Breast cancer Mother   . Heart attack Father   . Lung cancer Father   . Other Father     Protein S Deficiency  . Hypertension Maternal Grandmother   . Heart attack Paternal Grandmother   . Coronary artery disease Other   . Diabetes Other     History  Substance Use Topics  . Smoking status: Current Everyday Smoker -- 1.0 packs/day for 30 years    Types: Cigarettes  . Smokeless tobacco: Not on file   Comment: 0.5 packs per day  . Alcohol Use: No     Wine Occasionally    OB History    Grav Para Term Preterm Abortions TAB SAB Ect Mult Living                  Review of Systems  Constitutional: Negative for fever  and chills.  HENT: Negative for neck pain and neck stiffness.   Respiratory: Negative for chest tightness, shortness of breath and wheezing.   Cardiovascular: Positive for leg swelling. Negative for chest pain and palpitations.  Gastrointestinal: Negative for nausea, vomiting, abdominal pain, diarrhea and constipation.  Genitourinary: Negative for dysuria and flank pain.  Musculoskeletal: Negative for back pain.  Skin: Positive for color change. Negative for pallor, rash and wound.  Neurological: Negative for dizziness, weakness, light-headedness, numbness and headaches.    Allergies  Butalbital-apap-caffeine; Naproxen; and Tylenol  Home Medications   Current Outpatient Rx  Name Route Sig Dispense Refill  . ALPRAZOLAM 2 MG PO TABS Oral Take 2 mg by mouth 2 (two) times daily.      Marland Kitchen AMLODIPINE BESYLATE 10 MG PO TABS Oral Take 10 mg by mouth daily.      . CELECOXIB 200 MG PO CAPS Oral Take 1 capsule (200 mg total) by mouth daily. 30 capsule 11  . DICLOFENAC SODIUM 1 % TD GEL Topical Apply 1 application topically 4 (four) times daily as needed. For neck pain.    . SUPER HIGH VITAMINS/MINERALS PO TABS Oral Take 1 tablet by mouth daily.      Marland Kitchen  TIOTROPIUM BROMIDE MONOHYDRATE 18 MCG IN CAPS Inhalation Place 18 mcg into inhaler and inhale daily.      . WARFARIN SODIUM 7.5 MG PO TABS Oral Take 7.5 mg by mouth daily. Pt takes 7.5 mg daily    . OXYCODONE HCL 5 MG PO TABS Oral Take 1 tablet (5 mg total) by mouth every 4 (four) hours as needed for pain. 15 tablet 0    BP 152/78  Pulse 70  Temp(Src) 98.2 F (36.8 C) (Oral)  Resp 22  Ht 5\' 4"  (1.626 m)  Wt 175 lb (79.379 kg)  BMI 30.04 kg/m2  SpO2 100%  Physical Exam  Nursing note and vitals reviewed. Constitutional: She is oriented to person, place, and time. She appears well-developed and well-nourished.  HENT:  Head: Normocephalic and atraumatic.  Mouth/Throat: Oropharynx is clear and moist.  Eyes: EOM are normal. Pupils are equal,  round, and reactive to light.  Neck: Normal range of motion. Neck supple.  Cardiovascular: Normal rate and regular rhythm.  Exam reveals no gallop and no friction rub.   No murmur heard. Pulmonary/Chest: Effort normal and breath sounds normal. No respiratory distress. She has no wheezes. She has no rales.  Abdominal: Soft. Bowel sounds are normal. She exhibits no mass. There is no tenderness. There is no rebound and no guarding.  Musculoskeletal: She exhibits edema and tenderness.       RLE with calf tenderness and red streaking up medial thigh. Limited ROM due to pain. 2+ pulses intact  Neurological: She is alert and oriented to person, place, and time.       5/5 motor, sensation intact  Skin: Skin is warm and dry. No rash noted. No erythema.  Psychiatric: She has a normal mood and affect. Her behavior is normal.    ED Course  Procedures (including critical care time)  Labs Reviewed  PROTIME-INR - Abnormal; Notable for the following:    Prothrombin Time 22.7 (*)    INR 1.96 (*)    All other components within normal limits  COMPREHENSIVE METABOLIC PANEL - Abnormal; Notable for the following:    Glucose, Bld 109 (*)    GFR calc non Af Amer 81 (*)    All other components within normal limits  APTT  CBC   No results found.   1. Superficial thrombophlebitis of leg       MDM          Loren Racer, MD 10/04/11 1659

## 2011-10-04 NOTE — ED Notes (Signed)
MD at bedside. 

## 2011-10-04 NOTE — Progress Notes (Signed)
  Preliminary   Preliminary    Preliminary   Right lower extremity venous duplex completed.  Positive for superficial thrombophlebitis starting in the upper third of the calf in the greater saphenous vein extending up to the confluence of the saphenofemoral junction.  The thrombosis does not extend into the common femoral vein.  No DVT is noted in the right leg.  Negative for Baker's cyst.    Florestine Avers, RVT Chief Vascular Technologist

## 2011-10-04 NOTE — ED Notes (Signed)
Vascular at bedside to perform dopler exam.

## 2011-10-05 ENCOUNTER — Ambulatory Visit: Payer: Medicaid Other | Admitting: Internal Medicine

## 2011-10-06 ENCOUNTER — Telehealth: Payer: Self-pay

## 2011-10-06 ENCOUNTER — Ambulatory Visit: Payer: Medicaid Other | Admitting: Internal Medicine

## 2011-10-06 DIAGNOSIS — Z0289 Encounter for other administrative examinations: Secondary | ICD-10-CM

## 2011-10-06 MED ORDER — WARFARIN SODIUM 7.5 MG PO TABS: 7.5000 mg | ORAL_TABLET | Freq: Every day | ORAL | Status: DC

## 2011-10-06 NOTE — Telephone Encounter (Signed)
Patient called [facility] stating that see was seen in ER for clot and needs refills for coumadin. MD as already denied xanax for needing appt. I will send in a week worth of coumdin due to clot due to MD gone for today. Patient notified

## 2011-10-09 ENCOUNTER — Other Ambulatory Visit: Payer: Self-pay | Admitting: Internal Medicine

## 2011-10-18 ENCOUNTER — Ambulatory Visit (INDEPENDENT_AMBULATORY_CARE_PROVIDER_SITE_OTHER): Payer: Medicaid Other | Admitting: Internal Medicine

## 2011-10-18 ENCOUNTER — Encounter: Payer: Self-pay | Admitting: Internal Medicine

## 2011-10-18 DIAGNOSIS — I10 Essential (primary) hypertension: Secondary | ICD-10-CM

## 2011-10-18 DIAGNOSIS — Z1231 Encounter for screening mammogram for malignant neoplasm of breast: Secondary | ICD-10-CM

## 2011-10-18 DIAGNOSIS — M4680 Other specified inflammatory spondylopathies, site unspecified: Secondary | ICD-10-CM

## 2011-10-18 DIAGNOSIS — F411 Generalized anxiety disorder: Secondary | ICD-10-CM

## 2011-10-18 DIAGNOSIS — IMO0002 Reserved for concepts with insufficient information to code with codable children: Secondary | ICD-10-CM

## 2011-10-18 DIAGNOSIS — M4692 Unspecified inflammatory spondylopathy, cervical region: Secondary | ICD-10-CM | POA: Insufficient documentation

## 2011-10-18 DIAGNOSIS — J42 Unspecified chronic bronchitis: Secondary | ICD-10-CM

## 2011-10-18 DIAGNOSIS — Z7901 Long term (current) use of anticoagulants: Secondary | ICD-10-CM

## 2011-10-18 DIAGNOSIS — I82409 Acute embolism and thrombosis of unspecified deep veins of unspecified lower extremity: Secondary | ICD-10-CM

## 2011-10-18 DIAGNOSIS — F17200 Nicotine dependence, unspecified, uncomplicated: Secondary | ICD-10-CM

## 2011-10-18 MED ORDER — DICLOFENAC SODIUM 1 % TD GEL: 1.0000 "application " | Freq: Four times a day (QID) | TRANSDERMAL | Status: DC | PRN

## 2011-10-18 MED ORDER — ALPRAZOLAM 2 MG PO TABS: 2.0000 mg | ORAL_TABLET | Freq: Two times a day (BID) | ORAL | Status: DC

## 2011-10-18 MED ORDER — OXYCODONE HCL 5 MG PO TABS: 5.0000 mg | ORAL_TABLET | ORAL | Status: AC | PRN

## 2011-10-18 MED ORDER — WARFARIN SODIUM 7.5 MG PO TABS: 7.5000 mg | ORAL_TABLET | Freq: Every day | ORAL | Status: DC

## 2011-10-18 NOTE — Progress Notes (Signed)
Subjective:    Patient ID: Brooke Garcia, female    DOB: 04-Nov-1955, 56 y.o.   MRN: [id number]  Hypertension This is a chronic problem. The current episode started more than 1 year ago. The problem has been gradually improving since onset. The problem is controlled. Associated symptoms include anxiety and neck pain (radiates into left arm). Pertinent negatives include no blurred vision, chest pain, headaches, malaise/fatigue, orthopnea, palpitations, peripheral edema, PND, shortness of breath or sweats. Past treatments include calcium channel blockers. The current treatment provides moderate improvement. Compliance problems include exercise and diet.       Review of Systems  Constitutional: Negative for fever, chills, malaise/fatigue, diaphoresis, activity change, appetite change, fatigue and unexpected weight change.  HENT: Positive for neck pain (radiates into left arm) and neck stiffness. Negative for nosebleeds, congestion, facial swelling, rhinorrhea, sneezing and postnasal drip.   Eyes: Negative.  Negative for blurred vision.  Respiratory: Negative for apnea, cough, choking, chest tightness, shortness of breath, wheezing and stridor.   Cardiovascular: Negative for chest pain, palpitations, orthopnea and PND.  Genitourinary: Negative for dysuria, urgency, frequency, hematuria, flank pain, decreased urine volume, enuresis, difficulty urinating and dyspareunia.  Musculoskeletal: Positive for arthralgias (sight of ulcer hurts). Negative for myalgias, back pain, joint swelling and gait problem.  Skin: Positive for color change (ulcer on left lower posterior calf has healed bu the area is dark). Negative for pallor, rash and wound.  Neurological: Positive for numbness (in left arm). Negative for dizziness, tremors, syncope, facial asymmetry, speech difficulty, light-headedness and headaches. Weakness: in left arm.  Hematological: Negative for adenopathy. Does not bruise/bleed easily.    Psychiatric/Behavioral: Negative for suicidal ideas, hallucinations, behavioral problems, confusion, sleep disturbance, self-injury, dysphoric mood, decreased concentration and agitation. The patient is nervous/anxious. The patient is not hyperactive.        Objective:   Physical Exam  Vitals reviewed. Constitutional: She is oriented to person, place, and time. She appears well-developed and well-nourished. No distress.  HENT:  Head: Normocephalic and atraumatic.  Mouth/Throat: Oropharynx is clear and moist. No oropharyngeal exudate.  Eyes: Conjunctivae are normal. Right eye exhibits no discharge. Left eye exhibits no discharge. No scleral icterus.  Neck: Normal range of motion. Neck supple. No JVD present. No tracheal deviation present. No thyromegaly present.  Cardiovascular: Normal rate, regular rhythm, normal heart sounds and intact distal pulses.  Exam reveals no gallop and no friction rub.   No murmur heard. Pulmonary/Chest: Effort normal and breath sounds normal. No stridor. No respiratory distress. She has no wheezes. She has no rales. She exhibits no tenderness.  Abdominal: Soft. Bowel sounds are normal. She exhibits no distension and no mass. There is no tenderness. There is no rebound and no guarding.  Musculoskeletal: Normal range of motion. She exhibits edema (trace edema in both legs). She exhibits no tenderness.       Left lower leg: She exhibits edema (trace edema in both legs) and deformity. She exhibits no tenderness, no bony tenderness, no swelling and no laceration.       Legs: Lymphadenopathy:    She has no cervical adenopathy.  Neurological: She is oriented to person, place, and time.  Skin: Skin is warm and dry. No rash noted. She is not diaphoretic. No erythema. No pallor.  Psychiatric: She has a normal mood and affect. Her behavior is normal. Judgment and thought content normal.      Lab Results  Component Value Date   WBC 8.2 10/04/2011   HGB 14.7 10/04/2011  HCT 42.7 10/04/2011   PLT 195 10/04/2011   GLUCOSE 109* 10/04/2011   CHOL 188 07/04/2007   TRIG 52 07/04/2007   HDL 66.6 07/04/2007   LDLCALC 111* 07/04/2007   ALT 23 10/04/2011   AST 30 10/04/2011   NA 136 10/04/2011   K 4.4 10/04/2011   CL 101 10/04/2011   CREATININE 0.80 10/04/2011   BUN 6 10/04/2011   CO2 27 10/04/2011   TSH 1.56 07/04/2007   INR 1.96* 10/04/2011      Assessment & Plan:

## 2011-10-18 NOTE — Patient Instructions (Signed)
Hypertension As your heart beats, it forces blood through your arteries. This force is your blood pressure. If the pressure is too high, it is called hypertension (HTN) or high blood pressure. HTN is dangerous because you may have it and not know it. High blood pressure may mean that your heart has to work harder to pump blood. Your arteries may be narrow or stiff. The extra work puts you at risk for heart disease, stroke, and other problems.  Blood pressure consists of two numbers, a higher number over a lower, 110/72, for example. It is stated as "110 over 72." The ideal is below 120 for the top number (systolic) and under 80 for the bottom (diastolic). Write down your blood pressure today. You should pay close attention to your blood pressure if you have certain conditions such as:  Heart failure.   Prior heart attack.   Diabetes   Chronic kidney disease.   Prior stroke.   Multiple risk factors for heart disease.  To see if you have HTN, your blood pressure should be measured while you are seated with your arm held at the level of the heart. It should be measured at least twice. A one-time elevated blood pressure reading (especially in the Emergency Department) does not mean that you need treatment. There may be conditions in which the blood pressure is different between your right and left arms. It is important to see your caregiver soon for a recheck. Most people have essential hypertension which means that there is not a specific cause. This type of high blood pressure may be lowered by changing lifestyle factors such as:  Stress.   Smoking.   Lack of exercise.   Excessive weight.   Drug/tobacco/alcohol use.   Eating less salt.  Most people do not have symptoms from high blood pressure until it has caused damage to the body. Effective treatment can often prevent, delay or reduce that damage. TREATMENT  When a cause has been identified, treatment for high blood pressure is  directed at the cause. There are a large number of medications to treat HTN. These fall into several categories, and your caregiver will help you select the medicines that are best for you. Medications may have side effects. You should review side effects with your caregiver. If your blood pressure stays high after you have made lifestyle changes or started on medicines,   Your medication(s) may need to be changed.   Other problems may need to be addressed.   Be certain you understand your prescriptions, and know how and when to take your medicine.   Be sure to follow up with your caregiver within the time frame advised (usually within two weeks) to have your blood pressure rechecked and to review your medications.   If you are taking more than one medicine to lower your blood pressure, make sure you know how and at what times they should be taken. Taking two medicines at the same time can result in blood pressure that is too low.  SEEK IMMEDIATE MEDICAL CARE IF:  You develop a severe headache, blurred or changing vision, or confusion.   You have unusual weakness or numbness, or a faint feeling.   You have severe chest or abdominal pain, vomiting, or breathing problems.  MAKE SURE YOU:   Understand these instructions.   Will watch your condition.   Will get help right away if you are not doing well or get worse.  Document Released: 08/30/2005 Document Revised: 05/12/2011 Document Reviewed:   04/19/2008 [employer] Patient Information 2012 [place], [place].Deep Vein Thrombosis A deep vein thrombosis (DVT) is a blood clot (thrombus) that develops in a deep vein. A DVT is a clot in the deep, larger veins of the leg, arm, or pelvis. These are more dangerous than clots that might form in veins on the surface of the body. Deep vein thrombosis can lead to complications if the clot breaks off and travels in the bloodstream to the lungs. CAUSES Blood clots form in a vein for different reasons. Usually  several things cause blood clots. They include:  The flow of blood slows down.   The inside of the vein is damaged in some way.   The person has a condition that makes blood clot more easily. These conditions may include:   Older age (especially over 28 years old).   Having a history of blood clots.   Having major or lengthy surgery. Hip surgery is particularly high-risk.   Breaking a hip or leg.   Sitting or lying still for a long time.   Cancer or cancer treatment.   Having a long, thin tube (catheter) placed inside a vein during a medical procedure.   Being overweight (obese).   Pregnancy and childbirth.   Medicines with estrogen.   Smoking.   Other circulation or heart problems.  SYMPTOMS When a clot forms, it can either partially or totally block the blood flow in that vein. Symptoms of a DVT can include:  Swelling of the leg or arm, especially if one side is much worse.   Warmth and redness of the leg or arm, especially if one side is much worse.   Pain in an arm or leg. If the clot is in the leg, symptoms may be more noticeable or worse when standing or walking.  If the blood clot travels to the lung, it may cause:  Shortness of breath.   Chest pain. The pain may be worsened by deep breaths.   Coughing up thick mucus (phlegm), possibly flecked with blood.  Anyone with these symptoms should get emergency medical treatment right away. Call your local emergency services (911 in [place] if you have these symptoms. DIAGNOSIS If a DVT is suspected, your caregiver will take a full medical history. He or she will also perform a physical exam. Tests that also may be required include:  Studies of the clotting properties of the blood.   An ultrasound scan.   X-rays to show the flow of blood when special dye is injected into the veins (venography).   Studies of your lungs if you have any chest symptoms.  PREVENTION  Exercise the legs regularly. Take a brisk 30  minute walk every day.   Maintain a weight that is appropriate for your height.   Avoid sitting or lying in bed for long periods of time without moving your legs.   Women, particularly those over the age of 30, should consider the risks and benefits of taking estrogen medicines, including birth control pills.   Do not smoke, especially if you take estrogen medicines.   Long-distance travel can increase your risk. You should exercise your legs by walking or pumping the muscles every hour.   In hospital prevention:   Prevention may include medical and nonmedical measures.  TREATMENT  The most common treatment for DVT is blood thinning (anticoagulant) medicine, which reduces the blood's tendency to clot. Anticoagulants can stop new blood clots from forming and old ones from growing. They cannot dissolve existing clots. Your  body does this by itself over time. Anticoagulants can be given by mouth, by intravenous (IV) access, or by injection. Your caregiver will determine the best program for you.   Less commonly, clot-dissolving drugs (thrombolytics) are used to dissolve a DVT. They carry a high risk of bleeding, so they are used mainly in severe cases.   Very rarely, a blood clot in the leg needs to be removed surgically.   If you are unable to take anticoagulants, your caregiver may arrange for you to have a filter placed in a main vein in your belly (abdomen). This filter prevents clots from traveling to your lungs.  HOME CARE INSTRUCTIONS  Take all medicines prescribed by your caregiver. Follow the directions carefully.   You will most likely continue taking anticoagulants after you leave the hospital. Your caregiver will advise you on the length of treatment (usually 3 to 6 months, sometimes for life).   Taking too much or too little of an anticoagulant is dangerous. While taking this type of medicine, you will need to have regular blood tests to be sure the dose is correct. The dose  can change for many reasons. It is critically important that you take this medicine exactly as prescribed, and that you have blood tests exactly as directed.   Many foods can interfere with anticoagulants. These include foods high in vitamin K, such as spinach, kale, broccoli, cabbage, collard and turnip greens, Brussels sprouts, peas, cauliflower, seaweed, parsley, beef and pork liver, green tea, and soybean oil. Your caregiver should discuss limits on these foods with you or you should arrange a visit with a dietician to answer your questions.   Many medicines can interfere with anticoagulants. You must tell your caregiver about any and all medicines you take. This includes all vitamins and supplements. Be especially cautious with aspirin and anti-inflammatory medicines. Ask your caregiver before taking these.   Anticoagulants can have side effects, mostly excessive bruising or bleeding. You will need to hold pressure over cuts for longer than usual. Avoid alcoholic drinks or consume only very small amounts while taking this medicine.   If you are taking an anticoagulant:   Wear a medical alert bracelet.   Notify your dentist or other caregivers before procedures.   Avoid contact sports.   Ask your caregiver how soon you can go back to normal activities. Not being active can lead to new clots. Ask for a list of what you should and should not do.   Exercise your lower leg muscles. This is important while traveling.   You may need to wear compression stockings. These are tight elastic stockings that apply pressure to the lower legs. This can help keep the blood in the legs from clotting.   If you are a smoker, you should quit.   Learn as much as you can about DVT.  SEEK MEDICAL CARE IF:  You have unusual bruising or any bleeding problems.   The swelling or pain in your affected arm or leg is not gradually improving.   You anticipate surgery or long-distance travel. You should get  specific advice on DVT prevention.   You discover other family members with blood clots. This may require further testing for inherited diseases or conditions.  SEEK IMMEDIATE MEDICAL CARE IF:  You develop chest pain.   You develop severe shortness of breath.   You begin to cough up bloody mucus or phlegm (sputum).   You feel dizzy or faint.   You develop swelling or pain  in the leg.   You have breathing problems after traveling.  MAKE SURE YOU:  Understand these instructions.   Will watch your condition.   Will get help right away if you are not doing well or get worse.  Document Released: 08/30/2005 Document Revised: 05/12/2011 Document Reviewed: 10/22/2010 [facility] Patient Information 2012 [place], [place].

## 2011-10-18 NOTE — Assessment & Plan Note (Signed)
Continue current med 

## 2011-10-18 NOTE — Assessment & Plan Note (Signed)
Continue coumadin.  

## 2011-10-18 NOTE — Assessment & Plan Note (Signed)
Her BP is well controlled 

## 2011-10-18 NOTE — Assessment & Plan Note (Signed)
She has no s/s today 

## 2011-10-18 NOTE — Assessment & Plan Note (Signed)
I have asked her to have an MRI done to see if she has spinal stenosis, lesion, or impingement, she will continue with celebrex and oxy for pain

## 2011-10-18 NOTE — Assessment & Plan Note (Signed)
She has been established in the coumadin clinic and will return there in 2-3 week for an INR OR if she is nat able to be seen in the coumadin she will return here for an INR level

## 2011-10-18 NOTE — Assessment & Plan Note (Signed)
She agrees to quit smoking 

## 2011-10-20 ENCOUNTER — Ambulatory Visit (HOSPITAL_COMMUNITY): Payer: Medicaid Other | Attending: Internal Medicine

## 2011-10-21 ENCOUNTER — Telehealth: Payer: Self-pay | Admitting: Internal Medicine

## 2011-10-21 DIAGNOSIS — M4692 Unspecified inflammatory spondylopathy, cervical region: Secondary | ICD-10-CM

## 2011-10-21 NOTE — Telephone Encounter (Signed)
Dr Leilani Able Bruney MR CERVICAL SPINE WO CONTRAST was denied and was not scheduled  There was  Fax from her insurance company it was placed in your folder on 10-19-2011 and 2-7 again explaining why  pt was denied by [employer] I called the pt  To informed . Please advise on what to do next reguarding her referral  Thank you

## 2011-10-21 NOTE — Telephone Encounter (Signed)
I place an order for her to be seen in a pain clinic

## 2011-11-01 ENCOUNTER — Other Ambulatory Visit: Payer: Self-pay | Admitting: Internal Medicine

## 2011-11-02 ENCOUNTER — Encounter (HOSPITAL_BASED_OUTPATIENT_CLINIC_OR_DEPARTMENT_OTHER): Payer: Medicaid Other | Attending: General Surgery

## 2011-11-02 DIAGNOSIS — I872 Venous insufficiency (chronic) (peripheral): Secondary | ICD-10-CM | POA: Insufficient documentation

## 2011-11-02 DIAGNOSIS — L97809 Non-pressure chronic ulcer of other part of unspecified lower leg with unspecified severity: Secondary | ICD-10-CM | POA: Insufficient documentation

## 2011-11-02 DIAGNOSIS — Z79899 Other long term (current) drug therapy: Secondary | ICD-10-CM | POA: Insufficient documentation

## 2011-11-02 DIAGNOSIS — I808 Phlebitis and thrombophlebitis of other sites: Secondary | ICD-10-CM | POA: Insufficient documentation

## 2011-11-02 DIAGNOSIS — D6859 Other primary thrombophilia: Secondary | ICD-10-CM | POA: Insufficient documentation

## 2011-11-02 DIAGNOSIS — I10 Essential (primary) hypertension: Secondary | ICD-10-CM | POA: Insufficient documentation

## 2011-11-02 DIAGNOSIS — G589 Mononeuropathy, unspecified: Secondary | ICD-10-CM | POA: Insufficient documentation

## 2011-11-02 NOTE — Progress Notes (Signed)
[facility]  NAMELANAY, Brooke Garcia                 ACCOUNT NO.:  [id number]  MEDICAL RECORD NO.:  [id number]      DATE OF BIRTH:  07/25/1956  PHYSICIAN:  Brooke Garcia, M.D.         VISIT DATE:  11/02/2011                                  OFFICE VISIT   CHIEF COMPLAINT:  Wound, left leg.  HISTORY OF PRESENT ILLNESS:  This is a 56 year old female with a history of protein S disease and multiple episodes of phlebitis.  She has had known venous stasis disease for several years.  She had a venous ulcer on the left leg, which was healed out on March 25, 2011.  This current wound presented itself  approximately 1 month ago, and has not received any treatment.  PAST MEDICAL HISTORY:  Significant for neuropathy, kidney stone, shingles in 2011, hypertension, and anxiety tension state in addition to protein S deficiency.  PAST SURGICAL HISTORY:  Rotator cuff surgery and sinus surgery. Cigarettes, she smokes more than a pack a day and has tried very hard to quit.  ALLERGIES:  Naproxen and Tylenol.  MEDICATIONS:  Xanax, Coumadin, Norvasc, Celebrex, and Voltaren gel.  REVIEW OF SYSTEMS:  As above.  PHYSICAL EXAMINATION:  HEAD, EYES, EARS, NOSE, THROAT:  Normal. VITAL SIGNS:  Temperature 97.9, pulse 78, respirations 18, blood pressure 147/18. CHEST AND HEART:  Negative. EXTREMITIES:  Examination of the left lower extremity reveals swelling of the left lower extremity more than the right.  There are good peripheral pulses.  Posteriorly in the left lower extremity, there is a 3.9 x 3.0 black eschar, which is very adherent.  This is surrounded by an area of redness and extreme tenderness, possibly related to her dressing, but has slight appearance of cellulitis.  IMPRESSION:  Recurrent venous ulcer in a patient with recurrent superficial phlebitis and protein S deficiency.  PLAN OF TREATMENT:  Santyl, Hydrogel, and Profore Lite.  I have given her a prescription for Keflex  and oxycodone 5 mg.  she has elected to see Dr. Salomon Mast for the rest of her treatment.  She will see him next Thursday.     Brooke Garcia, M.D.     RA/MEDQ  D:  11/02/2011  T:  11/02/2011  Job:  [id number]

## 2011-11-09 ENCOUNTER — Telehealth: Payer: Self-pay

## 2011-11-09 NOTE — Telephone Encounter (Signed)
Patient called stating that she believes that MD made a mistake on her last alprazolam RX. Per pt she was given 2 mg BID #60 but it should be TID #90. Please advise Thanks

## 2011-11-11 ENCOUNTER — Ambulatory Visit (HOSPITAL_COMMUNITY): Admission: RE | Admit: 2011-11-11 | Payer: Medicaid Other | Source: Ambulatory Visit

## 2011-11-15 ENCOUNTER — Ambulatory Visit (HOSPITAL_COMMUNITY): Payer: Medicaid Other

## 2011-11-18 ENCOUNTER — Encounter (HOSPITAL_BASED_OUTPATIENT_CLINIC_OR_DEPARTMENT_OTHER): Payer: Medicaid Other | Attending: Internal Medicine

## 2011-11-18 DIAGNOSIS — Z7901 Long term (current) use of anticoagulants: Secondary | ICD-10-CM | POA: Insufficient documentation

## 2011-11-18 DIAGNOSIS — I10 Essential (primary) hypertension: Secondary | ICD-10-CM | POA: Insufficient documentation

## 2011-11-18 DIAGNOSIS — Z8614 Personal history of Methicillin resistant Staphylococcus aureus infection: Secondary | ICD-10-CM | POA: Insufficient documentation

## 2011-11-18 DIAGNOSIS — I872 Venous insufficiency (chronic) (peripheral): Secondary | ICD-10-CM | POA: Insufficient documentation

## 2011-11-18 DIAGNOSIS — Z79899 Other long term (current) drug therapy: Secondary | ICD-10-CM | POA: Insufficient documentation

## 2011-11-18 DIAGNOSIS — L97809 Non-pressure chronic ulcer of other part of unspecified lower leg with unspecified severity: Secondary | ICD-10-CM | POA: Insufficient documentation

## 2011-11-18 DIAGNOSIS — D6859 Other primary thrombophilia: Secondary | ICD-10-CM | POA: Insufficient documentation

## 2011-11-30 ENCOUNTER — Telehealth: Payer: Self-pay | Admitting: *Deleted

## 2011-11-30 ENCOUNTER — Encounter (HOSPITAL_BASED_OUTPATIENT_CLINIC_OR_DEPARTMENT_OTHER): Payer: Medicaid Other

## 2011-11-30 NOTE — Telephone Encounter (Signed)
Patient has not been seen in our office since 2009 and has switched to [facility].  Will forward to her PCP, Dr. Santa Genera

## 2011-11-30 NOTE — Telephone Encounter (Signed)
Patient called stating that she wished for Dr. Ermalene Searing to update the letter she wrote on 01/24/2008.  She stated that she now sees Dr. Yetta Barre as her PCP but she feels that Dr. Ermalene Searing understands her condition better than Dr. Yetta Barre does.  She stated that if Dr. Ermalene Searing is not willing to rewrite the letter, she would like the letter forwarded to Dr. Yetta Barre so he can update it.  Please advise.

## 2011-12-01 ENCOUNTER — Encounter: Payer: Self-pay | Admitting: Internal Medicine

## 2011-12-01 NOTE — Telephone Encounter (Signed)
Letter sent.

## 2011-12-07 ENCOUNTER — Ambulatory Visit (HOSPITAL_COMMUNITY): Payer: Medicaid Other

## 2011-12-09 ENCOUNTER — Telehealth: Payer: Self-pay

## 2011-12-09 MED ORDER — ALPRAZOLAM 2 MG PO TABS: 2.0000 mg | ORAL_TABLET | Freq: Three times a day (TID) | ORAL | Status: DC | PRN

## 2011-12-09 NOTE — Telephone Encounter (Signed)
ok 

## 2011-12-09 NOTE — Telephone Encounter (Signed)
Correction updated with pharmacy and epic// pt notified

## 2011-12-09 NOTE — Telephone Encounter (Signed)
Patient called stating that she takes 2 mg TID not BID. Per pt at last appt  BID was given correctly. Please clarify if you want pt to take TID

## 2011-12-16 ENCOUNTER — Encounter (HOSPITAL_BASED_OUTPATIENT_CLINIC_OR_DEPARTMENT_OTHER): Payer: Medicaid Other | Attending: Internal Medicine

## 2011-12-16 DIAGNOSIS — L97809 Non-pressure chronic ulcer of other part of unspecified lower leg with unspecified severity: Secondary | ICD-10-CM | POA: Insufficient documentation

## 2011-12-16 DIAGNOSIS — I872 Venous insufficiency (chronic) (peripheral): Secondary | ICD-10-CM | POA: Insufficient documentation

## 2011-12-16 DIAGNOSIS — D6859 Other primary thrombophilia: Secondary | ICD-10-CM | POA: Insufficient documentation

## 2011-12-30 ENCOUNTER — Ambulatory Visit (HOSPITAL_COMMUNITY): Payer: Medicaid Other

## 2012-01-13 ENCOUNTER — Encounter (HOSPITAL_BASED_OUTPATIENT_CLINIC_OR_DEPARTMENT_OTHER): Payer: Medicaid Other | Attending: Internal Medicine

## 2012-01-13 DIAGNOSIS — L97809 Non-pressure chronic ulcer of other part of unspecified lower leg with unspecified severity: Secondary | ICD-10-CM | POA: Insufficient documentation

## 2012-01-13 DIAGNOSIS — I872 Venous insufficiency (chronic) (peripheral): Secondary | ICD-10-CM | POA: Insufficient documentation

## 2012-01-13 DIAGNOSIS — D6859 Other primary thrombophilia: Secondary | ICD-10-CM | POA: Insufficient documentation

## 2012-01-18 ENCOUNTER — Other Ambulatory Visit: Payer: Self-pay | Admitting: Internal Medicine

## 2012-01-26 ENCOUNTER — Ambulatory Visit (HOSPITAL_COMMUNITY)
Admission: RE | Admit: 2012-01-26 | Discharge: 2012-01-26 | Disposition: A | Discharge status: Home / Self Care | Payer: Medicaid Other | Source: Ambulatory Visit | Attending: Internal Medicine | Admitting: Internal Medicine

## 2012-01-26 DIAGNOSIS — Z1231 Encounter for screening mammogram for malignant neoplasm of breast: Secondary | ICD-10-CM | POA: Insufficient documentation

## 2012-02-15 ENCOUNTER — Ambulatory Visit: Payer: Medicaid Other | Admitting: Internal Medicine

## 2012-02-17 ENCOUNTER — Encounter (HOSPITAL_BASED_OUTPATIENT_CLINIC_OR_DEPARTMENT_OTHER): Payer: Medicaid Other | Attending: Internal Medicine

## 2012-02-17 DIAGNOSIS — Z79899 Other long term (current) drug therapy: Secondary | ICD-10-CM | POA: Insufficient documentation

## 2012-02-17 DIAGNOSIS — Z7901 Long term (current) use of anticoagulants: Secondary | ICD-10-CM | POA: Insufficient documentation

## 2012-02-17 DIAGNOSIS — I872 Venous insufficiency (chronic) (peripheral): Secondary | ICD-10-CM | POA: Insufficient documentation

## 2012-02-17 DIAGNOSIS — I10 Essential (primary) hypertension: Secondary | ICD-10-CM | POA: Insufficient documentation

## 2012-02-17 DIAGNOSIS — L97309 Non-pressure chronic ulcer of unspecified ankle with unspecified severity: Secondary | ICD-10-CM | POA: Insufficient documentation

## 2012-02-17 DIAGNOSIS — D6859 Other primary thrombophilia: Secondary | ICD-10-CM | POA: Insufficient documentation

## 2012-02-24 ENCOUNTER — Telehealth: Payer: Self-pay

## 2012-02-24 NOTE — Telephone Encounter (Signed)
Pt advised in detail 

## 2012-02-24 NOTE — Telephone Encounter (Signed)
She needs to be seen.

## 2012-02-24 NOTE — Telephone Encounter (Signed)
Message copied by Pincus Sanes on Thu Feb 24, 2012 10:47 AM ------      Message from: Thomasena Edis      Created: Thu Feb 24, 2012 10:32 AM       Caller: Dharma/Patient; Phone Number: [phone]; Message from caller: Patient wants to know if Dr Yetta Barre can write her a prescription for oxycodone 10mg  for pain from her shoulder since her medicaid is showing [place] access please call to advise.

## 2012-03-14 ENCOUNTER — Encounter (HOSPITAL_BASED_OUTPATIENT_CLINIC_OR_DEPARTMENT_OTHER): Payer: Medicaid Other

## 2012-03-28 ENCOUNTER — Inpatient Hospital Stay (HOSPITAL_COMMUNITY): Payer: Medicaid Other

## 2012-03-28 ENCOUNTER — Inpatient Hospital Stay (HOSPITAL_BASED_OUTPATIENT_CLINIC_OR_DEPARTMENT_OTHER)
Admission: EM | Admit: 2012-03-28 | Discharge: 2012-04-02 | DRG: 683 | Disposition: A | Discharge status: Home / Self Care | Payer: Medicaid Other | Attending: Internal Medicine | Admitting: Internal Medicine

## 2012-03-28 ENCOUNTER — Encounter (HOSPITAL_BASED_OUTPATIENT_CLINIC_OR_DEPARTMENT_OTHER): Payer: Self-pay

## 2012-03-28 ENCOUNTER — Emergency Department (HOSPITAL_BASED_OUTPATIENT_CLINIC_OR_DEPARTMENT_OTHER): Payer: Medicaid Other

## 2012-03-28 DIAGNOSIS — E785 Hyperlipidemia, unspecified: Secondary | ICD-10-CM | POA: Diagnosis present

## 2012-03-28 DIAGNOSIS — Z8249 Family history of ischemic heart disease and other diseases of the circulatory system: Secondary | ICD-10-CM

## 2012-03-28 DIAGNOSIS — Z7901 Long term (current) use of anticoagulants: Secondary | ICD-10-CM

## 2012-03-28 DIAGNOSIS — Z1231 Encounter for screening mammogram for malignant neoplasm of breast: Secondary | ICD-10-CM

## 2012-03-28 DIAGNOSIS — IMO0002 Reserved for concepts with insufficient information to code with codable children: Secondary | ICD-10-CM

## 2012-03-28 DIAGNOSIS — Z87442 Personal history of urinary calculi: Secondary | ICD-10-CM

## 2012-03-28 DIAGNOSIS — L97929 Non-pressure chronic ulcer of unspecified part of left lower leg with unspecified severity: Secondary | ICD-10-CM

## 2012-03-28 DIAGNOSIS — R109 Unspecified abdominal pain: Secondary | ICD-10-CM

## 2012-03-28 DIAGNOSIS — L97909 Non-pressure chronic ulcer of unspecified part of unspecified lower leg with unspecified severity: Secondary | ICD-10-CM

## 2012-03-28 DIAGNOSIS — T45515A Adverse effect of anticoagulants, initial encounter: Secondary | ICD-10-CM | POA: Diagnosis present

## 2012-03-28 DIAGNOSIS — E7800 Pure hypercholesterolemia, unspecified: Secondary | ICD-10-CM

## 2012-03-28 DIAGNOSIS — Y92009 Unspecified place in unspecified non-institutional (private) residence as the place of occurrence of the external cause: Secondary | ICD-10-CM

## 2012-03-28 DIAGNOSIS — K5289 Other specified noninfective gastroenteritis and colitis: Secondary | ICD-10-CM

## 2012-03-28 DIAGNOSIS — M4692 Unspecified inflammatory spondylopathy, cervical region: Secondary | ICD-10-CM

## 2012-03-28 DIAGNOSIS — K559 Vascular disorder of intestine, unspecified: Secondary | ICD-10-CM

## 2012-03-28 DIAGNOSIS — F17200 Nicotine dependence, unspecified, uncomplicated: Secondary | ICD-10-CM

## 2012-03-28 DIAGNOSIS — F411 Generalized anxiety disorder: Secondary | ICD-10-CM

## 2012-03-28 DIAGNOSIS — Z86718 Personal history of other venous thrombosis and embolism: Secondary | ICD-10-CM

## 2012-03-28 DIAGNOSIS — I872 Venous insufficiency (chronic) (peripheral): Secondary | ICD-10-CM | POA: Diagnosis present

## 2012-03-28 DIAGNOSIS — I82409 Acute embolism and thrombosis of unspecified deep veins of unspecified lower extremity: Secondary | ICD-10-CM

## 2012-03-28 DIAGNOSIS — N179 Acute kidney failure, unspecified: Principal | ICD-10-CM

## 2012-03-28 DIAGNOSIS — D6859 Other primary thrombophilia: Secondary | ICD-10-CM

## 2012-03-28 DIAGNOSIS — A09 Infectious gastroenteritis and colitis, unspecified: Secondary | ICD-10-CM | POA: Diagnosis present

## 2012-03-28 DIAGNOSIS — K529 Noninfective gastroenteritis and colitis, unspecified: Secondary | ICD-10-CM

## 2012-03-28 DIAGNOSIS — R791 Abnormal coagulation profile: Secondary | ICD-10-CM | POA: Diagnosis present

## 2012-03-28 DIAGNOSIS — I10 Essential (primary) hypertension: Secondary | ICD-10-CM

## 2012-03-28 DIAGNOSIS — D72829 Elevated white blood cell count, unspecified: Secondary | ICD-10-CM | POA: Diagnosis present

## 2012-03-28 DIAGNOSIS — Z888 Allergy status to other drugs, medicaments and biological substances status: Secondary | ICD-10-CM

## 2012-03-28 LAB — PROTIME-INR: INR: 1.89 — ABNORMAL HIGH (ref 0.00–1.49)

## 2012-03-28 LAB — CBC WITH DIFFERENTIAL/PLATELET
Basophils Absolute: 0 10*3/uL (ref 0.0–0.1)
Eosinophils Absolute: 0.1 10*3/uL (ref 0.0–0.7)
Eosinophils Relative: 1 % (ref 0–5)
HCT: 44.9 % (ref 36.0–46.0)
Lymphocytes Relative: 18 % (ref 12–46)
MCH: 33.1 pg (ref 26.0–34.0)
MCV: 90.7 fL (ref 78.0–100.0)
Monocytes Absolute: 1.5 10*3/uL — ABNORMAL HIGH (ref 0.1–1.0)
RDW: 14.1 % (ref 11.5–15.5)
WBC: 17.6 10*3/uL — ABNORMAL HIGH (ref 4.0–10.5)

## 2012-03-28 LAB — COMPREHENSIVE METABOLIC PANEL
AST: 19 U/L (ref 0–37)
CO2: 22 mEq/L (ref 19–32)
Calcium: 9 mg/dL (ref 8.4–10.5)
Creatinine, Ser: 5.4 mg/dL — ABNORMAL HIGH (ref 0.50–1.10)
GFR calc Af Amer: 9 mL/min — ABNORMAL LOW (ref >90)
GFR calc non Af Amer: 8 mL/min — ABNORMAL LOW (ref >90)
Glucose, Bld: 113 mg/dL — ABNORMAL HIGH (ref 70–99)

## 2012-03-28 LAB — URINALYSIS, ROUTINE W REFLEX MICROSCOPIC
Bilirubin Urine: NEGATIVE
Protein, ur: >300 mg/dL — AB
Specific Gravity, Urine: 1.015 (ref 1.005–1.030)
Urobilinogen, UA: 0.2 mg/dL (ref 0.0–1.0)

## 2012-03-28 LAB — URINE MICROSCOPIC-ADD ON

## 2012-03-28 MED ORDER — SODIUM CHLORIDE 0.9 % IV SOLN
Freq: Once | INTRAVENOUS | Status: AC
  Administered 2012-03-28: 1000 mL/h via INTRAVENOUS

## 2012-03-28 MED ORDER — ONDANSETRON HCL 4 MG/2ML IJ SOLN
4.0000 mg | Freq: Once | INTRAMUSCULAR | Status: AC
  Administered 2012-03-28: 4 mg via INTRAVENOUS
  Filled 2012-03-28: qty 2

## 2012-03-28 MED ORDER — HYDROMORPHONE HCL PF 1 MG/ML IJ SOLN
1.0000 mg | Freq: Once | INTRAMUSCULAR | Status: AC
  Administered 2012-03-28: 1 mg via INTRAVENOUS
  Filled 2012-03-28: qty 1

## 2012-03-28 MED ORDER — ALBUTEROL SULFATE (5 MG/ML) 0.5% IN NEBU
2.5000 mg | INHALATION_SOLUTION | Freq: Four times a day (QID) | RESPIRATORY_TRACT | Status: DC
  Administered 2012-03-29 (×4): 2.5 mg via RESPIRATORY_TRACT
  Filled 2012-03-28 (×4): qty 0.5

## 2012-03-28 MED ORDER — IOHEXOL 300 MG/ML  SOLN: 20.0000 mL | INTRAMUSCULAR | Status: AC

## 2012-03-28 MED ORDER — ONDANSETRON HCL 4 MG/2ML IJ SOLN
INTRAMUSCULAR | Status: AC
  Administered 2012-03-28: 4 mg via INTRAVENOUS
  Filled 2012-03-28: qty 2

## 2012-03-28 MED ORDER — CIPROFLOXACIN IN D5W 400 MG/200ML IV SOLN
400.0000 mg | Freq: Two times a day (BID) | INTRAVENOUS | Status: DC
  Administered 2012-03-28 – 2012-03-29 (×2): 400 mg via INTRAVENOUS
  Filled 2012-03-28 (×3): qty 200

## 2012-03-28 MED ORDER — ONDANSETRON HCL 4 MG PO TABS: 4.0000 mg | ORAL_TABLET | Freq: Four times a day (QID) | ORAL | Status: DC | PRN

## 2012-03-28 MED ORDER — ALBUTEROL SULFATE (5 MG/ML) 0.5% IN NEBU
2.5000 mg | INHALATION_SOLUTION | RESPIRATORY_TRACT | Status: DC | PRN
  Administered 2012-04-02: 2.5 mg via RESPIRATORY_TRACT
  Filled 2012-03-28: qty 0.5

## 2012-03-28 MED ORDER — ONDANSETRON HCL 4 MG/2ML IJ SOLN
4.0000 mg | Freq: Four times a day (QID) | INTRAMUSCULAR | Status: DC | PRN
  Administered 2012-03-28 – 2012-04-02 (×14): 4 mg via INTRAVENOUS
  Filled 2012-03-28 (×14): qty 2

## 2012-03-28 MED ORDER — METRONIDAZOLE IN NACL 5-0.79 MG/ML-% IV SOLN
500.0000 mg | Freq: Three times a day (TID) | INTRAVENOUS | Status: DC
  Administered 2012-03-28 – 2012-03-31 (×9): 500 mg via INTRAVENOUS
  Filled 2012-03-28 (×12): qty 100

## 2012-03-28 MED ORDER — HYDROMORPHONE HCL PF 1 MG/ML IJ SOLN
INTRAMUSCULAR | Status: AC
  Administered 2012-03-29: 1 mg via INTRAVENOUS
  Filled 2012-03-28: qty 1

## 2012-03-28 MED ORDER — HYDROMORPHONE HCL PF 1 MG/ML IJ SOLN
1.0000 mg | INTRAMUSCULAR | Status: DC | PRN
  Administered 2012-03-28 – 2012-03-29 (×3): 1 mg via INTRAVENOUS
  Filled 2012-03-28 (×2): qty 1

## 2012-03-28 MED ORDER — IPRATROPIUM BROMIDE 0.02 % IN SOLN
0.5000 mg | Freq: Four times a day (QID) | RESPIRATORY_TRACT | Status: DC
  Administered 2012-03-29 (×4): 0.5 mg via RESPIRATORY_TRACT
  Filled 2012-03-28 (×4): qty 2.5

## 2012-03-28 MED ORDER — PROMETHAZINE HCL 25 MG PO TABS
25.0000 mg | ORAL_TABLET | Freq: Once | ORAL | Status: AC
  Administered 2012-03-28: 25 mg via ORAL
  Filled 2012-03-28: qty 1

## 2012-03-28 MED ORDER — DEXTROSE-NACL 5-0.9 % IV SOLN
INTRAVENOUS | Status: AC
  Administered 2012-03-28 – 2012-03-29 (×2): via INTRAVENOUS
  Administered 2012-03-29: 1000 mL via INTRAVENOUS
  Administered 2012-03-30 – 2012-03-31 (×3): via INTRAVENOUS

## 2012-03-28 NOTE — ED Provider Notes (Signed)
History     CSN: [id number]  Arrival date & time 03/28/12  1136   First MD Initiated Contact with Patient 03/28/12 1214      Chief Complaint  Patient presents with  . Emesis    (Consider location/radiation/quality/duration/timing/severity/associated sxs/prior treatment) Patient is a 56 y.o. female presenting with abdominal pain. The history is provided by the patient. No language interpreter was used.  Abdominal Pain The primary symptoms of the illness include abdominal pain, nausea, vomiting and diarrhea. Episode onset: 3 days. The onset of the illness was gradual. The problem has been gradually worsening.  The abdominal pain began more than 2 days ago. The pain came on gradually. The abdominal pain has been gradually worsening since its onset. The abdominal pain is generalized. The abdominal pain radiates to the back. The severity of the abdominal pain is 8/10. The abdominal pain is relieved by nothing. The abdominal pain is exacerbated by vomiting.  The diarrhea began 3 to 5 days ago. The diarrhea is bloody. The diarrhea occurs 5 to 10 times per day.  The illness is associated with a recent illness. The patient states that she believes she is currently not pregnant. Additional symptoms associated with the illness include chills and back pain. Significant associated medical issues include inflammatory bowel disease and gallstones.  Pt reports multiple episodes of bloody diarrhea.  Pt complains of diffuse abdominal pain.  Pt reports she is on coumadin for Protein S deficiency.    Past Medical History  Diagnosis Date  . Tobacco use disorder   . Unspecified urinary incontinence   . Complete rupture of rotator cuff   . Acute venous embolism and thrombosis of unspecified deep vessels of lower extremity   . Calculus of kidney   . Degeneration of intervertebral disc, site unspecified   . Painful respiration   . Other and unspecified noninfectious gastroenteritis and colitis   . Essential  hypertension, benign   . Family history of ischemic heart disease   . Anxiety state, unspecified   . Pure hypercholesterolemia   . Primary hypercoagulable state     Past Surgical History  Procedure Date  . Thrombectomy / embolectomy femoral artery     DVT  . Nasal sinus surgery   . Tubal ligation     Bilateral  . Rotator cuff repair     Family History  Problem Relation Age of Onset  . Breast cancer Mother   . Heart attack Father   . Lung cancer Father   . Other Father     Protein S Deficiency  . Hypertension Maternal Grandmother   . Heart attack Paternal Grandmother   . Coronary artery disease Other   . Diabetes Other     History  Substance Use Topics  . Smoking status: Current Everyday Smoker -- 1.0 packs/day for 30 years    Types: Cigarettes  . Smokeless tobacco: Not on file   Comment: 0.5 packs per day  . Alcohol Use: Yes     Wine Occasionally    OB History    Grav Para Term Preterm Abortions TAB SAB Ect Mult Living                  Review of Systems  Constitutional: Positive for chills.  Gastrointestinal: Positive for nausea, vomiting, abdominal pain, diarrhea and blood in stool.  Musculoskeletal: Positive for back pain.  All other systems reviewed and are negative.    Allergies  Butalbital-apap-caffeine; Naproxen; and Tylenol  Home Medications   Current Outpatient  Rx  Name Route Sig Dispense Refill  . ALPRAZOLAM 2 MG PO TABS Oral Take 1 tablet (2 mg total) by mouth 3 (three) times daily as needed for sleep. 90 tablet 5  . ALPRAZOLAM 2 MG PO TABS Oral Take 1 tablet (2 mg total) by mouth 3 (three) times daily as needed. 30 tablet 5  . AMLODIPINE BESYLATE 10 MG PO TABS  take 1 tablet by mouth once daily 90 tablet 3  . CELECOXIB 200 MG PO CAPS Oral Take 1 capsule (200 mg total) by mouth daily. 30 capsule 11  . DICLOFENAC SODIUM 1 % TD GEL Topical Apply 1 application topically 4 (four) times daily as needed. For neck pain. 100 g 11  . SUPER HIGH  VITAMINS/MINERALS PO TABS Oral Take 1 tablet by mouth daily.      Marland Kitchen TIOTROPIUM BROMIDE MONOHYDRATE 18 MCG IN CAPS Inhalation Place 18 mcg into inhaler and inhale daily.      . WARFARIN SODIUM 7.5 MG PO TABS  take 1 tablet by mouth once daily 30 tablet 2    BP 124/75  Pulse 96  Temp 97.8 F (36.6 C) (Oral)  Resp 24  Ht 5\' 4"  (1.626 m)  Wt 176 lb (79.833 kg)  BMI 30.21 kg/m2  SpO2 96%  Physical Exam  Nursing note and vitals reviewed. Constitutional: She is oriented to person, place, and time. She appears well-developed and well-nourished.  HENT:  Head: Normocephalic.  Right Ear: External ear normal.  Mouth/Throat: Oropharynx is clear and moist.  Eyes: Pupils are equal, round, and reactive to light.  Neck: Normal range of motion.  Cardiovascular: Normal rate, regular rhythm and normal heart sounds.   Pulmonary/Chest: Effort normal and breath sounds normal.  Abdominal: Soft. There is tenderness. There is guarding.  Genitourinary: Guaiac positive stool.       inflamed external hemorroid  Musculoskeletal: Normal range of motion.  Neurological: She is alert and oriented to person, place, and time.  Skin: Skin is warm.  Psychiatric: She has a normal mood and affect.    ED Course  Procedures (including critical care time)  Labs Reviewed  CBC WITH DIFFERENTIAL - Abnormal; Notable for the following:    WBC 17.6 (*)     Hemoglobin 16.4 (*)     MCHC 36.5 (*)     Neutro Abs 12.9 (*)     Monocytes Absolute 1.5 (*)     All other components within normal limits  COMPREHENSIVE METABOLIC PANEL - Abnormal; Notable for the following:    Sodium 131 (*)     Chloride 92 (*)     Glucose, Bld 113 (*)     BUN 51 (*)     Creatinine, Ser 5.40 (*)     Albumin 3.3 (*)     GFR calc non Af Amer 8 (*)     GFR calc Af Amer 9 (*)     All other components within normal limits  URINALYSIS, ROUTINE W REFLEX MICROSCOPIC - Abnormal; Notable for the following:    Glucose, UA 250 (*)     Hgb urine  dipstick TRACE (*)     Protein, ur >300 (*)     All other components within normal limits  PROTIME-INR - Abnormal; Notable for the following:    Prothrombin Time 22.0 (*)     INR 1.89 (*)     All other components within normal limits  URINE MICROSCOPIC-ADD ON - Abnormal; Notable for the following:    Bacteria, UA FEW (*)  Casts GRANULAR CAST (*)     All other components within normal limits   Ct Abdomen Pelvis Wo Contrast  03/28/2012  *RADIOLOGY REPORT*  Clinical Data: Abdominal pain, vomited, bloody stools  CT ABDOMEN AND PELVIS WITHOUT CONTRAST  Technique:  Multidetector CT imaging of the abdomen and pelvis was performed following the standard protocol without intravenous contrast.  Comparison: October 08, 2010  Findings: There is pericolonic stranding and colonic wall thickening involving the transverse colon and proximal descending colon.  Stranding is also present adjacent to the pancreatic tail which is located directly superior to the loop of inflamed bowel. There is no gross pneumatosis or pneumoperitoneum.  There is no free fluid or adenopathy within the abdomen or pelvis.  The appendix is seen in the right lower quadrant and has a normal appearance.  There is mild atelectasis / scarring within the left lung base. Gallstones are noted within the gallbladder lumen.  The liver, spleen, adrenal glands, kidneys, urinary bladder, uterus and adnexa, osseous structures have an unremarkable noncontrasted appearance.  IMPRESSION: There is mild - moderate wall thickening and stranding involving the transverse colon and proximal descending colon without evidence of pneumatosis or pneumoperitoneum.  This is likely secondary to infectious colitis, though ischemic colitis cannot be excluded on this noncontrasted study.  Cholelithiasis.  Original Report Authenticated By: Brandon Melnick, M.D.     1. Renal failure, acute   2. Colitis   3. Abdominal pain       MDM  Bun and Creat elevated.  They  were normal Jan 2013.  Ct scan shows colitis.   Pt reports some relief with IV pain medication and nausea medications.         Lonia Skinner [place], [place] 03/28/12 [phone]

## 2012-03-28 NOTE — Progress Notes (Addendum)
Transfer/Direct Brief Note  Transferring Facility: [facility] Requesting physician: Dione Booze, MD Time of request: [id number]  HPI: 56 year old presented with abdominal pain, n/v/d x3 days. Bloody diarrhea.  PMH: Protein S deficiency   Able to walk around ED.  Reported Vitals at time of conversation: 97.8, 24, 96, 124/75, 96%  Reported exam: --Abd--diffuse tenderness, non-surgical --rectal hemorrhoid  Reported impression: 1. Acute renal failure 2. GIB 3. Acute colitis: Infectious versus ischemic  4. Warfarin-induced coagulopathy  Plan based on telephone conversation: 1. Admit to medical bed 2. Workup renal failure 3. Treat colitis,  presumed infectious  Brendia Sacks, MD [facility] [phone] 03/28/2012, 5:13 PM

## 2012-03-28 NOTE — ED Provider Notes (Signed)
56 year old female has been having abdominal pain, vomiting, and bloody stools the last 3 days., Pain is severe. On exam, she appears very uncomfortable. Abdomen is distended and diffusely tender with decreased bowel sounds. Laboratory workup is significant for renal failure. Creatinine is up to 5. Renal failure and appears to have predated her abdominal pain and vomiting since it would not have expected her creatinine to have gotten as high from vomiting over just 3 days. Also, BUN/creatinine ratio is 10 which would be more indicative of intrinsic renal disease rather than from GI bleeding or dehydration. CT scan is pending. Hemoglobin has actually risen from the last recorded hemoglobin, so stool Hemoccult need to be done to evaluate whether she truly is passing blood. She will need to be hospitalized for evaluation of her renal failure as well as attention to whatever is found on CT scan.  Dione Booze, MD 03/28/12 1451

## 2012-03-28 NOTE — ED Notes (Signed)
C/o vomiting, rectal bleeding x 4 days-c/o pain to abd and rectum

## 2012-03-28 NOTE — ED Notes (Signed)
[facility]ia [facility]--spoke with Brooke Garcia. 

## 2012-03-28 NOTE — ED Notes (Signed)
Pt states that her sister called EMS yesterday-pt refused to come in

## 2012-03-29 ENCOUNTER — Encounter (HOSPITAL_COMMUNITY): Payer: Self-pay | Admitting: *Deleted

## 2012-03-29 DIAGNOSIS — Z7901 Long term (current) use of anticoagulants: Secondary | ICD-10-CM

## 2012-03-29 DIAGNOSIS — L97929 Non-pressure chronic ulcer of unspecified part of left lower leg with unspecified severity: Secondary | ICD-10-CM | POA: Diagnosis present

## 2012-03-29 DIAGNOSIS — I82409 Acute embolism and thrombosis of unspecified deep veins of unspecified lower extremity: Secondary | ICD-10-CM

## 2012-03-29 LAB — COMPREHENSIVE METABOLIC PANEL
CO2: 24 mEq/L (ref 19–32)
Calcium: 7.9 mg/dL — ABNORMAL LOW (ref 8.4–10.5)
Creatinine, Ser: 4.61 mg/dL — ABNORMAL HIGH (ref 0.50–1.10)
GFR calc Af Amer: 11 mL/min — ABNORMAL LOW (ref >90)
GFR calc non Af Amer: 10 mL/min — ABNORMAL LOW (ref >90)
Glucose, Bld: 104 mg/dL — ABNORMAL HIGH (ref 70–99)
Total Protein: 6.7 g/dL (ref 6.0–8.3)

## 2012-03-29 LAB — TSH: TSH: 2.516 u[IU]/mL (ref 0.350–4.500)

## 2012-03-29 LAB — PROTIME-INR
INR: 2.02 — ABNORMAL HIGH (ref 0.00–1.49)
Prothrombin Time: 23.2 seconds — ABNORMAL HIGH (ref 11.6–15.2)

## 2012-03-29 LAB — OCCULT BLOOD X 1 CARD TO LAB, STOOL: Fecal Occult Bld: POSITIVE

## 2012-03-29 LAB — CBC
Hemoglobin: 14.4 g/dL (ref 12.0–15.0)
RBC: 4.37 MIL/uL (ref 3.87–5.11)

## 2012-03-29 MED ORDER — CIPROFLOXACIN IN D5W 400 MG/200ML IV SOLN
400.0000 mg | INTRAVENOUS | Status: DC
  Administered 2012-03-30 – 2012-03-31 (×2): 400 mg via INTRAVENOUS
  Filled 2012-03-29 (×2): qty 200

## 2012-03-29 MED ORDER — PNEUMOCOCCAL VAC POLYVALENT 25 MCG/0.5ML IJ INJ
0.5000 mL | INJECTION | INTRAMUSCULAR | Status: AC
  Administered 2012-03-30: 0.5 mL via INTRAMUSCULAR
  Filled 2012-03-29: qty 0.5

## 2012-03-29 MED ORDER — HYDROMORPHONE HCL PF 1 MG/ML IJ SOLN
1.5000 mg | INTRAMUSCULAR | Status: DC | PRN
  Administered 2012-03-29 (×3): 1.5 mg via INTRAVENOUS
  Filled 2012-03-29 (×3): qty 2

## 2012-03-29 MED ORDER — HYDROMORPHONE HCL PF 1 MG/ML IJ SOLN
2.0000 mg | INTRAMUSCULAR | Status: DC | PRN
  Administered 2012-03-30 – 2012-04-01 (×16): 2 mg via INTRAVENOUS
  Filled 2012-03-29 (×16): qty 2

## 2012-03-29 NOTE — H&P (Signed)
Brooke Garcia is an 56 y.o. female.   Chief Complaint: Abdominal pain nausea vomiting HPI: 56 year old female with multiple medical problems who [facility] with complaint of persistent abdominal pain nausea vomiting for about 5 days. Symptoms started getting worse last Saturday with many abdominal pain and then nausea or vomiting. This was followed by diarrhea which was initiated a pure stool but became bloody right after. She went to the bathroom and this for 5 times a day until yesterday when he started becoming clear again. The pain was like 10 out of 10 at this height currently down to 5 out of 5 in his generalized. She's also felt weak and tired. [facility] and a [place] workup showed that her creatinine has jumped of 5. It was less than 1.0 in July of this year. Patient also was found to have findings on CT scan consistent with colitis. She denied any sick contacts. 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  Past Medical History  Diagnosis Date  . Tobacco use disorder   . Unspecified urinary incontinence   . Complete rupture of rotator cuff   . Acute venous embolism and thrombosis of unspecified deep vessels of lower extremity   . Calculus of kidney   . Degeneration of intervertebral disc, site unspecified   . Painful respiration   . Other and unspecified noninfectious gastroenteritis and colitis   . Essential hypertension, benign   . Family history of ischemic heart disease   . Anxiety state, unspecified   . Pure hypercholesterolemia   . Primary hypercoagulable state     Past Surgical History  Procedure Date  . Thrombectomy / embolectomy femoral artery     DVT  . Nasal sinus surgery   . Tubal ligation     Bilateral  . Rotator cuff repair     Family History  Problem Relation Age of Onset  . Breast cancer Mother   . Heart attack Father   . Lung cancer Father   . Other Father     Protein S Deficiency  . Hypertension Maternal  Grandmother   . Heart attack Paternal Grandmother   . Coronary artery disease Other   . Diabetes Other    Social History:  reports that she has been smoking Cigarettes.  She has a 30 pack-year smoking history. She does not have any smokeless tobacco history on file. She reports that she drinks alcohol. She reports that she does not use illicit drugs.  Allergies:  Allergies  Allergen Reactions  . Butalbital-Apap-Caffeine Hives  . Naproxen Hives  . Tylenol (Acetaminophen) Other (See Comments)    Upset stomach    Medications Prior to Admission  Medication Sig Dispense Refill  . alprazolam (XANAX) 2 MG tablet Take 1 tablet (2 mg total) by mouth 3 (three) times daily as needed.  30 tablet  5  . alprazolam (XANAX) 2 MG tablet Take 1 tablet (2 mg total) by mouth 3 (three) times daily as needed for sleep.  90 tablet  5  . amLODipine (NORVASC) 10 MG tablet take 1 tablet by mouth once daily  90 tablet  3  . celecoxib (CELEBREX) 200 MG capsule Take 1 capsule (200 mg total) by mouth daily.  30 capsule  11  . diclofenac sodium (VOLTAREN) 1 % GEL Apply 1 application topically 4 (four) times daily as needed. For neck pain.  100 g  11  . Multiple Vitamins-Minerals (MULTIVITAMIN,TX-MINERALS) tablet Take 1 tablet by mouth daily.        Marland Kitchen  tiotropium (SPIRIVA) 18 MCG inhalation capsule Place 18 mcg into inhaler and inhale daily.        Marland Kitchen warfarin (COUMADIN) 7.5 MG tablet take 1 tablet by mouth once daily  30 tablet  2    Results for orders placed during the hospital encounter of 03/28/12 (from the past 48 hour(s))  CBC WITH DIFFERENTIAL     Status: Abnormal   Collection Time   03/28/12 12:03 PM      Component Value Range Comment   WBC 17.6 (*) 4.0 - 10.5 K/uL    RBC 4.95  3.87 - 5.11 MIL/uL    Hemoglobin 16.4 (*) 12.0 - 15.0 g/dL    HCT 16.1  09.6 - 04.5 %    MCV 90.7  78.0 - 100.0 fL    MCH 33.1  26.0 - 34.0 pg    MCHC 36.5 (*) 30.0 - 36.0 g/dL    RDW 40.9  81.1 - 91.4 %    Platelets 271  150 -  400 K/uL    Neutrophils Relative 73  43 - 77 %    Neutro Abs 12.9 (*) 1.7 - 7.7 K/uL    Lymphocytes Relative 18  12 - 46 %    Lymphs Abs 3.1  0.7 - 4.0 K/uL    Monocytes Relative 9  3 - 12 %    Monocytes Absolute 1.5 (*) 0.1 - 1.0 K/uL    Eosinophils Relative 1  0 - 5 %    Eosinophils Absolute 0.1  0.0 - 0.7 K/uL    Basophils Relative 0  0 - 1 %    Basophils Absolute 0.0  0.0 - 0.1 K/uL   COMPREHENSIVE METABOLIC PANEL     Status: Abnormal   Collection Time   03/28/12 12:03 PM      Component Value Range Comment   Sodium 131 (*) 135 - 145 mEq/L    Potassium 3.8  3.5 - 5.1 mEq/L    Chloride 92 (*) 96 - 112 mEq/L    CO2 22  19 - 32 mEq/L    Glucose, Bld 113 (*) 70 - 99 mg/dL    BUN 51 (*) 6 - 23 mg/dL    Creatinine, Ser 7.82 (*) 0.50 - 1.10 mg/dL    Calcium 9.0  8.4 - 95.6 mg/dL    Total Protein 7.8  6.0 - 8.3 g/dL    Albumin 3.3 (*) 3.5 - 5.2 g/dL    AST 19  0 - 37 U/L    ALT 30  0 - 35 U/L    Alkaline Phosphatase 102  39 - 117 U/L    Total Bilirubin 0.6  0.3 - 1.2 mg/dL    GFR calc non Af Amer 8 (*) >90 mL/min    GFR calc Af Amer 9 (*) >90 mL/min   PROTIME-INR     Status: Abnormal   Collection Time   03/28/12 12:03 PM      Component Value Range Comment   Prothrombin Time 22.0 (*) 11.6 - 15.2 seconds    INR 1.89 (*) 0.00 - 1.49   URINALYSIS, ROUTINE W REFLEX MICROSCOPIC     Status: Abnormal   Collection Time   03/28/12 12:30 PM      Component Value Range Comment   Color, Urine YELLOW  YELLOW    APPearance CLEAR  CLEAR    Specific Gravity, Urine 1.015  1.005 - 1.030    pH 5.5  5.0 - 8.0    Glucose, UA 250 (*) NEGATIVE  mg/dL    Hgb urine dipstick TRACE (*) NEGATIVE    Bilirubin Urine NEGATIVE  NEGATIVE    Ketones, ur NEGATIVE  NEGATIVE mg/dL    Protein, ur >409 (*) NEGATIVE mg/dL    Urobilinogen, UA 0.2  0.0 - 1.0 mg/dL    Nitrite NEGATIVE  NEGATIVE    Leukocytes, UA NEGATIVE  NEGATIVE   URINE MICROSCOPIC-ADD ON     Status: Abnormal   Collection Time   03/28/12 12:30 PM        Component Value Range Comment   Squamous Epithelial / LPF RARE  RARE    WBC, UA 0-2  <3 WBC/hpf    RBC / HPF 0-2  <3 RBC/hpf    Bacteria, UA FEW (*) RARE    Casts GRANULAR CAST (*) NEGATIVE   OCCULT BLOOD X 1 CARD TO LAB, STOOL     Status: Normal   Collection Time   03/28/12  4:03 PM      Component Value Range Comment   Fecal Occult Bld POSITIVE      Ct Abdomen Pelvis Wo Contrast  03/28/2012  *RADIOLOGY REPORT*  Clinical Data: Abdominal pain, vomited, bloody stools  CT ABDOMEN AND PELVIS WITHOUT CONTRAST  Technique:  Multidetector CT imaging of the abdomen and pelvis was performed following the standard protocol without intravenous contrast.  Comparison: October 08, 2010  Findings: There is pericolonic stranding and colonic wall thickening involving the transverse colon and proximal descending colon.  Stranding is also present adjacent to the pancreatic tail which is located directly superior to the loop of inflamed bowel. There is no gross pneumatosis or pneumoperitoneum.  There is no free fluid or adenopathy within the abdomen or pelvis.  The appendix is seen in the right lower quadrant and has a normal appearance.  There is mild atelectasis / scarring within the left lung base. Gallstones are noted within the gallbladder lumen.  The liver, spleen, adrenal glands, kidneys, urinary bladder, uterus and adnexa, osseous structures have an unremarkable noncontrasted appearance.  IMPRESSION: There is mild - moderate wall thickening and stranding involving the transverse colon and proximal descending colon without evidence of pneumatosis or pneumoperitoneum.  This is likely secondary to infectious colitis, though ischemic colitis cannot be excluded on this noncontrasted study.  Cholelithiasis.  Original Report Authenticated By: Brandon Melnick, M.D.   US Renal  03/28/2012  *RADIOLOGY REPORT*  Clinical Data: Acute renal failure.  RENAL/URINARY TRACT ULTRASOUND COMPLETE  Comparison:  CT abdomen and  pelvis 03/28/2012  Findings:  Right Kidney:  Right kidney measures 13 cm length.  Normal parenchymal echotexture.  No focal mass or hydronephrosis.  Left Kidney:  Left kidney measures 12 cm length.  The renal parenchymal echotexture is nodular and hyperechoic.  This suggests medical renal disease versus small vessel disease.  There is a 3 mm shadowing echogenic focus in the lower pole consistent with a stone.  No hydronephrosis or solid mass.  Bladder:  Bladder volume is calculated at 3-13 ml pre voiding.  No bladder wall thickening or filling defects.  IMPRESSION: Normal appearance of the right kidney and bladder.  Nodular echogenic parenchymal pattern on the left suggesting medical renal disease versus vascular disease.  Small nonobstructing stone in the lower pole of the left kidney.  Original Report Authenticated By: Marlon Pel, M.D.    Review of Systems  Constitutional: Positive for fever and chills.  Eyes: Negative.   Respiratory: Negative.   Cardiovascular: Negative.   Gastrointestinal: Positive for nausea, vomiting, abdominal  pain, diarrhea and blood in stool.  Genitourinary: Negative.   Musculoskeletal: Negative.   Skin: Negative.   Neurological: Positive for weakness.  Endo/Heme/Allergies: Negative.   Psychiatric/Behavioral: Negative.     Blood pressure 146/81, pulse 91, temperature 98.7 F (37.1 C), temperature source Oral, resp. rate 18, height 5\' 4"  (1.626 m), weight 86.773 kg (191 lb 4.8 oz), SpO2 96.00%. Physical Exam  Constitutional: She is oriented to person, place, and time. She appears well-developed and well-nourished.  HENT:  Head: Normocephalic and atraumatic.  Right Ear: External ear normal.  Left Ear: External ear normal.  Nose: Nose normal.  Mouth/Throat: Oropharynx is clear and moist.  Eyes: Conjunctivae and EOM are normal. Pupils are equal, round, and reactive to light.  Neck: Normal range of motion. Neck supple.  Cardiovascular: Normal rate, regular  rhythm, normal heart sounds and intact distal pulses.   Respiratory: Effort normal and breath sounds normal.  GI: Soft. Bowel sounds are normal.  Musculoskeletal: Normal range of motion.  Neurological: She is alert and oriented to person, place, and time. She has normal reflexes.  Skin: Skin is warm and dry.  Psychiatric: She has a normal mood and affect. Her behavior is normal. Judgment and thought content normal.     Assessment/Plan 56 year old female admitted with colitis and acute renal failure. Patient also has wound on her left lower extremity she has Unna boots in place. Her renal failure more than likely is prerenal from the excessive stool and diarrhea. It could also have preceded her abdominal pain nausea vomiting diarrhea could also be intrinsic renal. She did take some NSAIDs on and off for headaches and back pain.  Plan #1 acute renal failure: Patient will be admitted to the renal failure. We will hydrate her aggressively, check renal ultrasound, check FENA, urine eosinophils and consider renal consult if she is not improving.  Plan #2 colitis: This is most likely infectious. It could also be ischemic although patient is on Coumadin but she is not therapeutic. She is in sinus rhythm at the moment so I doubt that she has embolus. I will start her empirically on Cipro and Flagyl. Get blood cultures stool for C. difficile assay as well as cultures. We'll follow her closely until resolution of her symptoms. We will get GI consult also in the morning.  Plan #3 tobacco abuse: Patient has been counseled on we'll continue to counsel her and offer nicotine patch.  Plan #4 left lower extremity venous ulcer: Patient has Unna boots in place. We will continue with that. We'll get wound care consult.  Plan #5 hypertension: We will hold antihypertensives unless blood pressure spikes. We'll avoid ACE inhibitors and diuretics.  Plan #6 hyperlipidemia: Continue his home medications as  necessary.  Plan #7 hypercoagulable state: With patient's symptoms of GI bleed I will hold her Coumadin.  Daphene Chisholm,LAWAL 03/29/2012, 4:36 AM

## 2012-03-29 NOTE — Progress Notes (Signed)
Pt transferred from [facility], admitted to Rm 6731. Pt comes from home alone. She is alert and oriented. Ambulatory with standby assist. She does have a compression dressing to her Left leg, states she has a venous stasis ulcer that is cared for at the wound center. No other skin breakdown noted. Oriented to room, instructed to call for assistance before getting out of bed. Placed on contact isolation for frequent diarrhea/loose stools. Resting comfortably at this time, will continue to monitor  [facility]

## 2012-03-29 NOTE — ED Provider Notes (Signed)
Medical screening examination/treatment/procedure(s) were conducted as a shared visit with non-physician practitioner(s) and myself.  I personally evaluated the patient during the encounter   Dione Booze, MD 03/29/12 [phone]

## 2012-03-29 NOTE — Progress Notes (Signed)
[facility] PROGRESS NOTE  Brooke Garcia ZOX:[id number] DOB: October 07, 1955 DOA: 03/28/2012 PCP: Sanda Linger, MD  Assessment/Plan: Active Problems:  TOBACCO ABUSE  Anticoagulant long-term use  Renal failure, acute  Colitis  Abdominal pain  Leucocytosis  Leg ulcer, left  1. Acute renal failure: Patient resented with a creatine of 5.4, consistent with ARF, against known baseline creatinine of <1.0 in early July, 2013. This is likely predominantly pre-renal, due to volume depletion caused by GI fluid loss,and dehydration, against a background of NSAID/Cox 2 inhibitor therapy. Managing with aggressive iv fluids, and have discontinued nephrotoxins. Renal ultrasound showed normal appearance of the right kidney and bladder. Nodular echogenic parenchymal pattern on the left suggesting medical renal disease versus vascular disease. Small nonobstructing stone in the lower pole of the left kidney. Today, creatinine is 4.67. Further improvement is anticipated. We shall consider renal consult if progress is tardy.  2. Acute Colitis: Abdominal/Pellvic CT confirmed mild - moderate wall thickening and stranding involving the transverse colon and proximal descending colon without evidence of pneumatosis or pneumoperitoneum. This is possibly infectious, although patient denies ingestion of unusual foods or clustering of cases. Ischemic colitis is also a possiblity although patient is on Coumadin and her bloody stools may be due to Coumadin-induced coagulopathy, against a background of intestinal mucosal friability, due to inflammation. On empirically Cipro/Flagyl day#1. C. difficile and blood cultures are pending. [facility] has been consulted.  3. Tobacco abuse: Patient has been counseled appropriately.  4. left lower extremity venous ulcer: This is chronic, over the last 2 years, and patient attends the wound clinic once a week, for Unna wrap. Wound care consult has been requested.  5. Hypertension: BP is  currently normotensive, We shall observe for now.  6. Hypercoagulable state: Patient has known Protein S deficiency, and previous history of venous/arterial thromb-embolic disease. Due to her symptoms of GI bleed, Coumadin has been held.   Code Status: Full Code Family Communication:  Disposition Plan: For discharge home, when stable.    Brief narrative: [facility] with complaint of persistent abdominal pain nausea vomiting for about 5 days, followed by bloody diarrhea. Workup showed creatinine of 5. It was less than 1.0 in July of this year. Patient also was found to have findings on CT scan consistent with colitis. She denied any sick contacts. She is on chronic Coumadin, secondary to repeated DVTs and hypercoagulable state.   Consultants:  GI: [place].  Procedures:  N/A.  Antibiotics: Ciprofloxacin/Flagyl started 03/29/12.   HPI/Subjective: Complains mainly of abdominal pain.Vomited early this AM, still has diarrhea, but less.   Objective: Filed Vitals:   03/28/12 1957 03/29/12 0148 03/29/12 0601 03/29/12 0848  BP: 146/81  108/61   Pulse: 91  84   Temp: 98.7 F (37.1 C)  98.5 F (36.9 C)   TempSrc: Oral  Oral   Resp: 18  18   Height:      Weight: 86.773 kg (191 lb 4.8 oz)     SpO2: 88% 96% 92% 94%   No intake or output data in the 24 hours ending 03/29/12 1055  Exam: General: Comfortable, alert, communicative, fully oriented, not short of breath at rest.  HEENT:  No clinical pallor, no jaundice, no conjunctival injection or discharge. Hydraton is improved overnight, but buccal mucosa is still "dry".  NECK:  Supple, JVP not seen, no carotid bruits, no palpable lymphadenopathy, no palpable goiter. CHEST:  Clinically clear to auscultation, no wheezes, no crackles. HEART:  Sounds 1 and  2 heard, normal, regular, no murmurs. ABDOMEN:  Full, soft, diffuse tenderness, worse in upper abdomen, no palpable organomegaly, no palpable masses, normal bowel  sounds. GENITALIA:  Not examined. LOWER EXTREMITIES:  RLE: No pitting edema, palpable peripheral pulses. LLE: In Unna wrap.  MUSCULOSKELETAL SYSTEM:  Unremarkable. CENTRAL NERVOUS SYSTEM:  No focal neurologic deficit on gross examination.  Data Reviewed: Basic Metabolic Panel:  Lab 03/29/12 1610 03/28/12 1203  NA 134* 131*  K 3.7 3.8  CL 98 92*  CO2 24 22  GLUCOSE 104* 113*  BUN 49* 51*  CREATININE 4.61* 5.40*  CALCIUM 7.9* 9.0  MG -- --  PHOS -- --   Liver Function Tests:  Lab 03/29/12 0615 03/28/12 1203  AST 16 19  ALT 21 30  ALKPHOS 107 102  BILITOT 0.5 0.6  PROT 6.7 7.8  ALBUMIN 2.8* 3.3*   No results found for this basename: LIPASE:5,AMYLASE:5 in the last 168 hours No results found for this basename: AMMONIA:5 in the last 168 hours CBC:  Lab 03/29/12 0615 03/28/12 1203  WBC 11.6* 17.6*  NEUTROABS -- 12.9*  HGB 14.4 16.4*  HCT 40.7 44.9  MCV 93.1 90.7  PLT 185 271   Cardiac Enzymes: No results found for this basename: CKTOTAL:5,CKMB:5,CKMBINDEX:5,TROPONINI:5 in the last 168 hours BNP (last 3 results) No results found for this basename: PROBNP:3 in the last 8760 hours CBG: No results found for this basename: GLUCAP:5 in the last 168 hours  No results found for this or any previous visit (from the past 240 hour(s)).   Studies: Ct Abdomen Pelvis Wo Contrast  03/28/2012  *RADIOLOGY REPORT*  Clinical Data: Abdominal pain, vomited, bloody stools  CT ABDOMEN AND PELVIS WITHOUT CONTRAST  Technique:  Multidetector CT imaging of the abdomen and pelvis was performed following the standard protocol without intravenous contrast.  Comparison: October 08, 2010  Findings: There is pericolonic stranding and colonic wall thickening involving the transverse colon and proximal descending colon.  Stranding is also present adjacent to the pancreatic tail which is located directly superior to the loop of inflamed bowel. There is no gross pneumatosis or pneumoperitoneum.  There is  no free fluid or adenopathy within the abdomen or pelvis.  The appendix is seen in the right lower quadrant and has a normal appearance.  There is mild atelectasis / scarring within the left lung base. Gallstones are noted within the gallbladder lumen.  The liver, spleen, adrenal glands, kidneys, urinary bladder, uterus and adnexa, osseous structures have an unremarkable noncontrasted appearance.  IMPRESSION: There is mild - moderate wall thickening and stranding involving the transverse colon and proximal descending colon without evidence of pneumatosis or pneumoperitoneum.  This is likely secondary to infectious colitis, though ischemic colitis cannot be excluded on this noncontrasted study.  Cholelithiasis.  Original Report Authenticated By: Brandon Melnick, M.D.   US Renal  03/28/2012  *RADIOLOGY REPORT*  Clinical Data: Acute renal failure.  RENAL/URINARY TRACT ULTRASOUND COMPLETE  Comparison:  CT abdomen and pelvis 03/28/2012  Findings:  Right Kidney:  Right kidney measures 13 cm length.  Normal parenchymal echotexture.  No focal mass or hydronephrosis.  Left Kidney:  Left kidney measures 12 cm length.  The renal parenchymal echotexture is nodular and hyperechoic.  This suggests medical renal disease versus small vessel disease.  There is a 3 mm shadowing echogenic focus in the lower pole consistent with a stone.  No hydronephrosis or solid mass.  Bladder:  Bladder volume is calculated at 3-13 ml pre voiding.  No  bladder wall thickening or filling defects.  IMPRESSION: Normal appearance of the right kidney and bladder.  Nodular echogenic parenchymal pattern on the left suggesting medical renal disease versus vascular disease.  Small nonobstructing stone in the lower pole of the left kidney.  Original Report Authenticated By: Marlon Pel, M.D.    Scheduled Meds:   . sodium chloride   Intravenous Once  . albuterol  2.5 mg Nebulization Q6H  . ciprofloxacin  400 mg Intravenous Q12H  .   HYDROmorphone (DILAUDID) injection  1 mg Intravenous Once  .  HYDROmorphone (DILAUDID) injection  1 mg Intravenous Once  .  HYDROmorphone (DILAUDID) injection  1 mg Intravenous Once  .  HYDROmorphone (DILAUDID) injection  1 mg Intravenous Once  . iohexol  20 mL Oral Q1 Hr x 2  . ipratropium  0.5 mg Nebulization Q6H  . metronidazole  500 mg Intravenous Q8H  . ondansetron  4 mg Intravenous Once  . ondansetron  4 mg Intravenous Once  . pneumococcal 23 valent vaccine  0.5 mL Intramuscular Tomorrow-1000  . promethazine  25 mg Oral Once   Continuous Infusions:   . dextrose 5 % and 0.9% NaCl 150 mL/hr at 03/28/12 2244    Active Problems:  TOBACCO ABUSE  Anticoagulant long-term use  Renal failure, acute  Colitis  Abdominal pain  Leucocytosis  Leg ulcer, left     Alysse Rathe,CHRISTOPHER  [facility] Pager [phone]. If 8PM-8AM, please contact night-coverage at [email or web address], password [facility] 03/29/2012, 10:55 AM  LOS: 1 day

## 2012-03-29 NOTE — Consult Note (Signed)
[facility] Consult: 12:02 PM 03/29/2012   Referring Provider: Dr Brien Few  Primary Care Physician:  Sanda Linger, MD Primary Gastroenterologist:  none  Reason for Consultation:  Abdominal pain, nausea, vomitting, diarrhea. HPI: Brooke Garcia is a 56 y.o. female.  Pt with hypercoagulable disordern Protein S deficiency.  Hx DVT. On chronic Coumadin.  INR subtherapeutic at admission.  Not anemic.   Onset of abdominal cramps, bloody diarrhea, later non-bloody emesis on Sat 7/13 through Sunday.  sxs a bit better Monday, no stool or vomitting til that PM.  Convinced EMS, called by pts sister, that she did not need to go to ED.  Relented yest AM and came to ED. Had acute renal failure.  Felt to have dehydration in setting of NSAID use (voltaren topical and Celebrex for neck pain).  Colitis by non-contrast abdominal CT.  Placed on Cipro and Flagyl IV.  Vomiting has ceased, stools no longer grossly bloody, orangey-brown but still loose.  Pain of abdomen at mid an d lower region is at about a 6 or 7, treted currently with prn Dilaudid  Normal BMs q day to qo day but can go up to one week between BMs and she can not recall her last previous BM before illness struck on 7/13. No weight loss.  No acid reflux sxs normally.  Will occasionally see scant blood PR when wiping, she says it is from her hemorrhoids.  1 to 2 glasses wine q month.  Smokes 30 cigs/day.  No hematuria, oliguria of a few days ago resolved.  No nose bleeds.   Chronic venous stasis ulcer on left lower leg, followed weekly at wound center. No blurry vision.  No trouble chewing or swallowing . No palpitations, cp, sob, pnd.  Mat grandmother died in her 60's with colon cancer.  Mat aunt has had multiple colon polyps, pt's mom died of breat cancer in her 59s.     Past Medical History  Diagnosis Date  . Tobacco use disorder   . Unspecified urinary incontinence   . Complete rupture of rotator  cuff   . Acute venous embolism and thrombosis of unspecified deep vessels of lower extremity   . Calculus of kidney   . Degeneration of intervertebral disc, site unspecified   . Painful respiration   . Other and unspecified noninfectious gastroenteritis and colitis   . Essential hypertension, benign   . Family history of ischemic heart disease   . Anxiety state, unspecified   . Pure hypercholesterolemia   . Primary hypercoagulable state     Past Surgical History  Procedure Date  . Thrombectomy / embolectomy femoral artery     DVT  . Nasal sinus surgery   . Tubal ligation     Bilateral  . Rotator cuff repair     Prior to Admission medications   Medication Sig Start Date End Date Taking? Authorizing Provider  alprazolam Prudy Feeler) 2 MG tablet Take 1 tablet (2 mg total) by mouth 3 (three) times daily as needed. 12/09/11  Yes Etta Grandchild, MD  alprazolam Prudy Feeler) 2 MG tablet Take 1 tablet (2 mg total) by mouth 3 (three) times daily as needed for sleep. 03/25/11 04/24/11  Etta Grandchild, MD  amLODipine (NORVASC) 10 MG tablet take 1 tablet by mouth once daily 11/01/11   Etta Grandchild, MD  celecoxib (CELEBREX) 200 MG capsule Take 1 capsule (200 mg total) by mouth daily. 06/17/11 06/16/12  Etta Grandchild, MD  diclofenac sodium (VOLTAREN) 1 % GEL Apply 1  application topically 4 (four) times daily as needed. For neck pain. 10/18/11   Etta Grandchild, MD  Multiple Vitamins-Minerals (MULTIVITAMIN,TX-MINERALS) tablet Take 1 tablet by mouth daily.      Historical Provider, MD  tiotropium (SPIRIVA) 18 MCG inhalation capsule Place 18 mcg into inhaler and inhale daily.   05/05/11 05/04/12  Etta Grandchild, MD  warfarin (COUMADIN) 7.5 MG tablet take 1 tablet by mouth once daily 01/18/12   Etta Grandchild, MD    Scheduled Meds:    . sodium chloride   Intravenous Once  . albuterol  2.5 mg Nebulization Q6H  . ciprofloxacin  400 mg Intravenous Q12H  .  HYDROmorphone (DILAUDID) injection  1 mg Intravenous Once  .   HYDROmorphone (DILAUDID) injection  1 mg Intravenous Once  .  HYDROmorphone (DILAUDID) injection  1 mg Intravenous Once  .  HYDROmorphone (DILAUDID) injection  1 mg Intravenous Once  . iohexol  20 mL Oral Q1 Hr x 2  . ipratropium  0.5 mg Nebulization Q6H  . metronidazole  500 mg Intravenous Q8H  . ondansetron  4 mg Intravenous Once  . ondansetron  4 mg Intravenous Once  . pneumococcal 23 valent vaccine  0.5 mL Intramuscular Tomorrow-1000  . promethazine  25 mg Oral Once   Infusions:    . dextrose 5 % and 0.9% NaCl 1,000 mL (03/29/12 1110)   PRN Meds: albuterol, HYDROmorphone (DILAUDID) injection, ondansetron (ZOFRAN) IV, ondansetron, DISCONTD:  HYDROmorphone (DILAUDID) injection   Allergies as of 03/28/2012 - Review Complete 03/28/2012  Allergen Reaction Noted  . Butalbital-apap-caffeine Hives 04/30/2010  . Naproxen Hives   . Tylenol (acetaminophen) Other (See Comments) 08/10/2011    Family History  Problem Relation Age of Onset  . Breast cancer Mother   . Heart attack Father   . Lung cancer Father   . Other Father     Protein S Deficiency  . Hypertension Maternal Grandmother   . Heart attack Paternal Grandmother   . Coronary artery disease Other   . Diabetes Other     History   Social History  . Marital Status: Single    Spouse Name: N/A    Number of Children: N/A  . Years of Education: N/A   Occupational History  . Not on file.   Social History Main Topics  . Smoking status: Current Everyday Smoker -- 1.0 packs/day for 30 years    Types: Cigarettes  . Smokeless tobacco: Not on file   Comment: 0.5 packs per day  . Alcohol Use: Yes     Wine Occasionally  . Drug Use: No  . Sexually Active: Not on file   Other Topics Concern  . Not on file   Social History Narrative   Married.Occupation: currently disabled    REVIEW OF SYSTEMS: See HPI for the results of 14 system review.    PHYSICAL EXAM: Vital signs in last 24 hours: Temp:  [98 F (36.7  C)-98.7 F (37.1 C)] 98 F (36.7 C) (07/17 1108) Pulse Rate:  [84-91] 90  (07/17 1108) Resp:  [16-18] 18  (07/17 1108) BP: (108-153)/(61-107) 131/83 mmHg (07/17 1108) SpO2:  [88 %-96 %] 94 % (07/17 1108) Weight:  [191 lb 4.8 oz (86.773 kg)] 191 lb 4.8 oz (86.773 kg) (07/16 1957)  General: Looks unhealthy but not toxic or acutely ill. Head:  No signs of trauma.  No facial edema  Eyes:  No icterus or pallor.  EOMI Ears:  Not HOH  Nose:  Sounds congested Mouth:  Pink, moist, clear MM Neck:  No mass or JVD. Lungs:  Clear B.  Vocal quality hoarse. No dyspnea Heart: RRR.  No MRG Abdomen:  Soft, mildly obese, active BS.  ND, no HSM, bruits.  Slightly tender in mid/lower abdomen..   Rectal: not done.  FOB positive in lab   Musc/Skeltl: no joint redness or swelling Extremities:  B LE edema, non-pitting.  ACE/gauze wrapped Left calf/foot  Neurologic:  Pleasant, anxious, non-somnolent.  Excellent historian.  Moves all 4s.  No tremor Skin:  No telangectasia on trunk Tattoos:  None observed Nodes:  No cervical adenopathy   Psych:  Pleasant, anxoius  Intake/Output from previous day:   Intake/Output this shift:    LAB RESULTS:  Basename 03/29/12 0615 03/28/12 1203  WBC 11.6* 17.6*  HGB 14.4 16.4*  HCT 40.7 44.9  PLT 185 271   BMET Lab Results  Component Value Date   NA 134* 03/29/2012   NA 131* 03/28/2012   NA 136 10/04/2011   K 3.7 03/29/2012   K 3.8 03/28/2012   K 4.4 10/04/2011   CL 98 03/29/2012   CL 92* 03/28/2012   CL 101 10/04/2011   CO2 24 03/29/2012   CO2 22 03/28/2012   CO2 27 10/04/2011   GLUCOSE 104* 03/29/2012   GLUCOSE 113* 03/28/2012   GLUCOSE 109* 10/04/2011   BUN 49* 03/29/2012   BUN 51* 03/28/2012   BUN 6 10/04/2011   CREATININE 4.61* 03/29/2012   CREATININE 5.40* 03/28/2012   CREATININE 0.80 10/04/2011   CALCIUM 7.9* 03/29/2012   CALCIUM 9.0 03/28/2012   CALCIUM 9.6 10/04/2011   LFT  Basename 03/29/12 0615 03/28/12 1203  PROT 6.7 7.8  ALBUMIN 2.8* 3.3*  AST 16  19  ALT 21 30  ALKPHOS 107 102  BILITOT 0.5 0.6  BILIDIR -- --  IBILI -- --   PT/INR Lab Results  Component Value Date   INR 2.02* 03/29/2012   INR 1.89* 03/28/2012   INR 1.96* 10/04/2011   PROTIME 24.0* 08/16/2007   PROTIME 30.0* 07/13/2007   PROTIME 19.2* 07/06/2007   Hepatitis Panel No results found for this basename: HEPBSAG,HCVAB,HEPAIGM,HEPBIGM in the last 72 hours C-Diff No components found with this basename: cdiff    Drugs of Abuse     Component Value Date/Time   LABOPIA NONE DETECTED 02/20/2009 1502   COCAINSCRNUR NONE DETECTED 02/20/2009 1502   LABBENZ POSITIVE* 02/20/2009 1502   AMPHETMU NONE DETECTED 02/20/2009 1502   THCU NONE DETECTED 02/20/2009 1502   LABBARB  Value: NONE DETECTED        DRUG SCREEN FOR MEDICAL PURPOSES ONLY.  IF CONFIRMATION IS NEEDED FOR ANY PURPOSE, NOTIFY LAB WITHIN 5 DAYS.        LOWEST DETECTABLE LIMITS FOR URINE DRUG SCREEN Drug Class       Cutoff (ng/mL) Amphetamine      1000 Barbiturate      200 Benzodiazepine   200 Tricyclics       300 Opiates          300 Cocaine          300 THC              50 02/20/2009 1502     RADIOLOGY STUDIES: Ct Abdomen Pelvis Wo Contrast 03/28/2012  *RADIOLOGY REPORT*  Clinical Data: Abdominal pain, vomited, bloody stools  CT ABDOMEN AND PELVIS WITHOUT CONTRAST  Technique:  Multidetector CT imaging of the abdomen and pelvis was performed following the standard protocol without intravenous contrast.  Comparison: October 08, 2010  Findings: There is pericolonic stranding and colonic wall thickening involving the transverse colon and proximal descending colon.  Stranding is also present adjacent to the pancreatic tail which is located directly superior to the loop of inflamed bowel. There is no gross pneumatosis or pneumoperitoneum.  There is no free fluid or adenopathy within the abdomen or pelvis.  The appendix is seen in the right lower quadrant and has a normal appearance.  There is mild atelectasis / scarring within the  left lung base. Gallstones are noted within the gallbladder lumen.  The liver, spleen, adrenal glands, kidneys, urinary bladder, uterus and adnexa, osseous structures have an unremarkable noncontrasted appearance.  IMPRESSION: There is mild - moderate wall thickening and stranding involving the transverse colon and proximal descending colon without evidence of pneumatosis or pneumoperitoneum.  This is likely secondary to infectious colitis, though ischemic colitis cannot be excluded on this noncontrasted study.  Cholelithiasis.  Original Report Authenticated By: Brandon Melnick, M.D.   US Renal 03/28/2012  *RADIOLOGY REPORT*  Clinical Data: Acute renal failure.  RENAL/URINARY TRACT ULTRASOUND COMPLETE  Comparison:  CT abdomen and pelvis 03/28/2012  Findings:  Right Kidney:  Right kidney measures 13 cm length.  Normal parenchymal echotexture.  No focal mass or hydronephrosis.  Left Kidney:  Left kidney measures 12 cm length.  The renal parenchymal echotexture is nodular and hyperechoic.  This suggests medical renal disease versus small vessel disease.  There is a 3 mm shadowing echogenic focus in the lower pole consistent with a stone.  No hydronephrosis or solid mass.  Bladder:  Bladder volume is calculated at 3-13 ml pre voiding.  No bladder wall thickening or filling defects.  IMPRESSION: Normal appearance of the right kidney and bladder.  Nodular echogenic parenchymal pattern on the left suggesting medical renal disease versus vascular disease.  Small nonobstructing stone in the lower pole of the left kidney.  Original Report Authenticated By: Marlon Pel, M.D.    ENDOSCOPIC STUDIES: Never had colon or egd  IMPRESSION: *  Bloody diarrhea, colitis of transverse and descending colon.  Suspect ischemic colitis in pt with hypercoag disorder.  *  Family hx in mat grandmother and Mat aunt of colon cancer and polyps. *  Acute renal failure.  Medical renal vs vascular renal disease per ultrasound.  Urine  out put and BUN/Creat improved.  *  Chronic coumadin.  On hold  PLAN: *  Per Dr Rhea Belton.  *  Pt anxious about, but probably willing to undergo colonoscopy.   LOS: 1 day   Jennye Moccasin  03/29/2012, 12:02 PM Pager: [phone]

## 2012-03-29 NOTE — Progress Notes (Signed)
Introduced myself and chaplain services to pt.  I explained support was available anytime.  Pt was receptive to offer.  I will continue to follow.  Please page me if further assistance is needed. Brooke Garcia  [phone] personal pager

## 2012-03-29 NOTE — Care Management Note (Signed)
  Page 1 of 1   03/29/2012     2:45:46 PM   CARE MANAGEMENT NOTE 03/29/2012  Patient:  Brooke Garcia, Brooke Garcia   Account Number:  [id number]  Date Initiated:  03/29/2012  Documentation initiated by:  Ronny Flurry  Subjective/Objective Assessment:   DX: acute renal failure,  colitis     Action/Plan:   Anticipated DC Date:  04/01/2012   Anticipated DC Plan:  HOME/SELF CARE         Choice offered to / List presented to:             Status of service:  In process, will continue to follow Medicare Important Message given?   (If response is "NO", the following Medicare IM given date fields will be blank) Date Medicare IM given:   Date Additional Medicare IM given:    Discharge Disposition:    Per UR Regulation:  Reviewed for med. necessity/level of care/duration of stay  If discussed at Long Length of Stay Meetings, dates discussed:    Comments:

## 2012-03-29 NOTE — Consult Note (Signed)
WOC consult Note Reason for Consult: Consult requested for left leg stasis ulcer.  Pt followed by outpatient wound care center and will miss appointment this week.  Dressing has not been changed in 2 weeks, according to pt. Wound type: Left outer calf with chronic full thickness stasis ulcer. Measurement: .8X1X.2cm Wound bed:80% red, 20% yellow Drainage (amount, consistency, odor) Small yellow drainage. Periwound: Intact skin surrounding. Dressing procedure/placement/frequency: Dressing changed as per routine followed by wound care center, according to patient. Zinc ointment applied to periwound edges after cleansing, then silver hydrofiber applied over wound bed.  This was covered by foam silicone dressing, abd pads, kerlex, and acewrap to provide light compression from behind toes to below knee. These are scheduled to be changed Q week. Pt states she will make a follow-up appointment next week for the wound care center to assess wound after discharge from the hospital.  Will not plan to follow further unless re-consulted.  [address], RN, MSN, [employer]  [phone]

## 2012-03-29 NOTE — Consult Note (Signed)
Patient seen, examined, and I agree with the above documentation, including the assessment and plan. Given acute onset of symptoms, I suspect this is ischemic colitis. Infectious workup ongoing, and she remains on empiric antibiotics. For now continue supportive care. Chronic warfarin on hold. I do think she needs colonoscopy, though I do not think it needs to be urgent/now. If she fails to improve, or has further/worsening bleeding, colonoscopy could be performed in-house. Otherwise, we'll plan colonoscopy within 6 weeks after discharge.

## 2012-03-30 LAB — CBC
MCH: 32.4 pg (ref 26.0–34.0)
MCHC: 34.6 g/dL (ref 30.0–36.0)
MCV: 93.6 fL (ref 78.0–100.0)
Platelets: 178 10*3/uL (ref 150–400)
RBC: 4.07 MIL/uL (ref 3.87–5.11)
RDW: 13.9 % (ref 11.5–15.5)

## 2012-03-30 LAB — GI PATHOGEN PANEL BY PCR, STOOL
Campylobacter by PCR: NEGATIVE
E coli (ETEC) LT/ST: NEGATIVE
E coli (STEC): NEGATIVE
Norovirus GI/GII: NEGATIVE

## 2012-03-30 LAB — COMPREHENSIVE METABOLIC PANEL
AST: 17 U/L (ref 0–37)
CO2: 23 mEq/L (ref 19–32)
Calcium: 7.4 mg/dL — ABNORMAL LOW (ref 8.4–10.5)
Creatinine, Ser: 2.6 mg/dL — ABNORMAL HIGH (ref 0.50–1.10)
GFR calc non Af Amer: 20 mL/min — ABNORMAL LOW (ref >90)

## 2012-03-30 LAB — FECAL LACTOFERRIN, QUANT

## 2012-03-30 MED ORDER — POTASSIUM CHLORIDE CRYS ER 20 MEQ PO TBCR
40.0000 meq | EXTENDED_RELEASE_TABLET | Freq: Every day | ORAL | Status: DC
  Administered 2012-03-30 – 2012-04-02 (×4): 40 meq via ORAL
  Filled 2012-03-30 (×5): qty 2

## 2012-03-30 NOTE — Progress Notes (Addendum)
ANTICOAGULATION CONSULT NOTE - Initial Consult  Pharmacy Consult for Coumadin Indication: History of hypercoagulopathy  Allergies  Allergen Reactions  . Butalbital-Apap-Caffeine Hives  . Naproxen Hives  . Tylenol (Acetaminophen) Other (See Comments)    Upset stomach    Patient Measurements: Height: 5\' 4"  (162.6 cm) Weight: 191 lb 4.8 oz (86.773 kg) IBW/kg (Calculated) : 54.7   Vital Signs: Temp: 98.3 F (36.8 C) (07/18 0613) Temp src: Oral (07/18 0613) BP: 116/65 mmHg (07/18 0613) Pulse Rate: 81  (07/18 0613)  Labs:  Basename 03/30/12 0555 03/29/12 0615 03/28/12 1203  HGB 13.2 14.4 --  HCT 38.1 40.7 44.9  PLT 178 185 271  APTT -- -- --  LABPROT -- 23.2* 22.0*  INR -- 2.02* 1.89*  HEPARINUNFRC -- -- --  CREATININE 2.60* 4.61* 5.40*  CKTOTAL -- -- --  CKMB -- -- --  TROPONINI -- -- --    Estimated Creatinine Clearance: 26.1 ml/min (by C-G formula based on Cr of 2.6).   Medical History: Past Medical History  Diagnosis Date  . Tobacco use disorder   . Unspecified urinary incontinence   . Complete rupture of rotator cuff   . Acute venous embolism and thrombosis of unspecified deep vessels of lower extremity   . Calculus of kidney   . Degeneration of intervertebral disc, site unspecified   . Painful respiration   . Other and unspecified noninfectious gastroenteritis and colitis   . Essential hypertension, benign   . Family history of ischemic heart disease   . Anxiety state, unspecified   . Pure hypercholesterolemia   . Primary hypercoagulable state     Medications:  Scheduled:    . ciprofloxacin  400 mg Intravenous Q24H  . metronidazole  500 mg Intravenous Q8H  . pneumococcal 23 valent vaccine  0.5 mL Intramuscular Tomorrow-1000  . potassium chloride  40 mEq Oral Daily  . DISCONTD: albuterol  2.5 mg Nebulization Q6H  . DISCONTD: ciprofloxacin  400 mg Intravenous Q12H  . DISCONTD: ipratropium  0.5 mg Nebulization Q6H    Assessment: 56 yo female  with history of hypercoagulopathy (Protein S deficiency) to resume Coumadin tomorrow; per Dr. Brien Few would like to "commence anticoagulation on 03/31/12." Coumadin has been on hold due to sxs of GI bleed. INR on 7/17 of 2.02, no INR today; CBC ok though Hgb trending down.    Goal of Therapy:  INR 2-3 Monitor platelets by anticoagulation protocol: Yes   Plan:  1. Will order daily PT/INR, dosing to begin tomorrow  Concha Norway 03/30/2012,2:11 PM

## 2012-03-30 NOTE — Progress Notes (Addendum)
[facility] PROGRESS NOTE  Brooke Garcia ZOX:[id number] DOB: 10-22-1955 DOA: 03/28/2012 PCP: Sanda Linger, MD  Assessment/Plan: Active Problems:  TOBACCO ABUSE  Anticoagulant long-term use  Renal failure, acute  Colitis  Abdominal pain  Leucocytosis  Leg ulcer, left  1. Acute renal failure: Patient resented with a creatine of 5.4, consistent with ARF, against known baseline creatinine of <1.0 in early July, 2013. This is likely predominantly pre-renal, due to volume depletion caused by GI fluid loss,and dehydration, against a background of NSAID/Cox 2 inhibitor therapy. Managing with aggressive iv fluids, and have discontinued nephrotoxins. Renal ultrasound showed normal appearance of the right kidney and bladder. Nodular echogenic parenchymal pattern on the left suggesting medical renal disease versus vascular disease. Small nonobstructing stone in the lower pole of the left kidney. Clinical improvement has been steady. Today, creatinine is 2.60. 2. Acute Colitis: Abdominal/Pelvic CT confirmed mild - moderate wall thickening and stranding involving the transverse colon and proximal descending colon without evidence of pneumatosis or pneumoperitoneum. This is possibly infectious, although patient denies ingestion of unusual foods or clustering of cases. Ischemic colitis is also a possiblity although patient is on Coumadin and her bloody stools may be due to Coumadin-induced coagulopathy, against a background of intestinal mucosal friability, due to inflammation. On empirically Cipro/Flagyl day#2. C. difficile and blood cultures are pending. GI consult was provided by Dr Erick Blinks. Managing as recommended. Have advanced diet to low residue today. GI appears to favor ischemic etiology and will likely scope patient as outpatient, on discharge. Will restart Coumadin from 03/31/12, if stable.  3. Tobacco abuse: Patient has been counseled appropriately.  4. left lower extremity venous ulcer: This is  chronic, over the last 2 years, and patient attends the wound clinic once a week, for Unna wrap. Patient has been evaluated by the wound care team. Zinc ointment applied to periwound edges after cleansing, then silver hydrofiber applied over wound bed. This was covered by foam silicone dressing, abd pads, kerlex, and acewrap to provide light compression from behind toes to below knee. These are scheduled to be changed Q week. 5. Hypertension: BP is currently normotensive, We shall observe for now.  6. Hypercoagulable state: Patient has known Protein S deficiency, and previous history of venous/arterial thromb-embolic disease. Due to her symptoms of GI bleed, Coumadin has been held.   Code Status: Full Code Family Communication:  Disposition Plan: For discharge home, when stable.    Brief narrative: Presented to [facility] with complaint of persistent abdominal pain nausea vomiting for about 5 days, followed by bloody diarrhea. Workup showed creatinine of 5. It was less than 1.0 in July of this year. Patient also was found to have findings on CT scan consistent with colitis. She denied any sick contacts. She is on chronic Coumadin, secondary to repeated DVTs and hypercoagulable state.   Consultants:  GI: [place].  Procedures:  N/A.  Antibiotics: Ciprofloxacin/Flagyl started 03/29/12.   HPI/Subjective: Still has diarrhea, but less. Abdominal pain has improved and patient would like to advance diet.  Objective: Filed Vitals:   03/29/12 1812 03/29/12 2025 03/29/12 2059 03/30/12 0613  BP: 142/82  130/81 116/65  Pulse: 88  97 81  Temp: 98.6 F (37 C)  98.9 F (37.2 C) 98.3 F (36.8 C)  TempSrc: Oral  Oral Oral  Resp: 18  18 20   Height:      Weight:      SpO2: 93% 94% 93% 96%    Intake/Output Summary (Last 24 hours) at  03/30/12 1349 Last data filed at 03/30/12 0518  Gross per 24 hour  Intake   1100 ml  Output      0 ml  Net   1100 ml    Exam: General:  Comfortable, alert, communicative, fully oriented, not short of breath at rest.  HEENT:  No clinical pallor, no jaundice, no conjunctival injection or discharge. Hydraton is improved overnight, but buccal mucosa is still "dry".  NECK:  Supple, JVP not seen, no carotid bruits, no palpable lymphadenopathy, no palpable goiter. CHEST:  Clinically clear to auscultation, no wheezes, no crackles. HEART:  Sounds 1 and 2 heard, normal, regular, no murmurs. ABDOMEN:  Full, soft, diffuse tenderness, worse in upper abdomen, no palpable organomegaly, no palpable masses, normal bowel sounds. GENITALIA:  Not examined. LOWER EXTREMITIES:  RLE: No pitting edema, palpable peripheral pulses. LLE: In Unna wrap.  MUSCULOSKELETAL SYSTEM:  Unremarkable. CENTRAL NERVOUS SYSTEM:  No focal neurologic deficit on gross examination.  Data Reviewed: Basic Metabolic Panel:  Lab 03/30/12 1610 03/29/12 0615 03/28/12 1203  NA 136 134* 131*  K 3.4* 3.7 3.8  CL 102 98 92*  CO2 23 24 22   GLUCOSE 105* 104* 113*  BUN 29* 49* 51*  CREATININE 2.60* 4.61* 5.40*  CALCIUM 7.4* 7.9* 9.0  MG -- -- --  PHOS -- -- --   Liver Function Tests:  Lab 03/30/12 0555 03/29/12 0615 03/28/12 1203  AST 17 16 19   ALT 17 21 30   ALKPHOS 82 107 102  BILITOT 0.5 0.5 0.6  PROT 6.2 6.7 7.8  ALBUMIN 2.8* 2.8* 3.3*   No results found for this basename: LIPASE:5,AMYLASE:5 in the last 168 hours No results found for this basename: AMMONIA:5 in the last 168 hours CBC:  Lab 03/30/12 0555 03/29/12 0615 03/28/12 1203  WBC 9.2 11.6* 17.6*  NEUTROABS -- -- 12.9*  HGB 13.2 14.4 16.4*  HCT 38.1 40.7 44.9  MCV 93.6 93.1 90.7  PLT 178 185 271   Cardiac Enzymes: No results found for this basename: CKTOTAL:5,CKMB:5,CKMBINDEX:5,TROPONINI:5 in the last 168 hours BNP (last 3 results) No results found for this basename: PROBNP:3 in the last 8760 hours CBG: No results found for this basename: GLUCAP:5 in the last 168 hours  No results found for  this or any previous visit (from the past 240 hour(s)).   Studies: Ct Abdomen Pelvis Wo Contrast  03/28/2012  *RADIOLOGY REPORT*  Clinical Data: Abdominal pain, vomited, bloody stools  CT ABDOMEN AND PELVIS WITHOUT CONTRAST  Technique:  Multidetector CT imaging of the abdomen and pelvis was performed following the standard protocol without intravenous contrast.  Comparison: October 08, 2010  Findings: There is pericolonic stranding and colonic wall thickening involving the transverse colon and proximal descending colon.  Stranding is also present adjacent to the pancreatic tail which is located directly superior to the loop of inflamed bowel. There is no gross pneumatosis or pneumoperitoneum.  There is no free fluid or adenopathy within the abdomen or pelvis.  The appendix is seen in the right lower quadrant and has a normal appearance.  There is mild atelectasis / scarring within the left lung base. Gallstones are noted within the gallbladder lumen.  The liver, spleen, adrenal glands, kidneys, urinary bladder, uterus and adnexa, osseous structures have an unremarkable noncontrasted appearance.  IMPRESSION: There is mild - moderate wall thickening and stranding involving the transverse colon and proximal descending colon without evidence of pneumatosis or pneumoperitoneum.  This is likely secondary to infectious colitis, though ischemic colitis cannot be  excluded on this noncontrasted study.  Cholelithiasis.  Original Report Authenticated By: Brandon Melnick, M.D.   US Renal  03/28/2012  *RADIOLOGY REPORT*  Clinical Data: Acute renal failure.  RENAL/URINARY TRACT ULTRASOUND COMPLETE  Comparison:  CT abdomen and pelvis 03/28/2012  Findings:  Right Kidney:  Right kidney measures 13 cm length.  Normal parenchymal echotexture.  No focal mass or hydronephrosis.  Left Kidney:  Left kidney measures 12 cm length.  The renal parenchymal echotexture is nodular and hyperechoic.  This suggests medical renal disease versus  small vessel disease.  There is a 3 mm shadowing echogenic focus in the lower pole consistent with a stone.  No hydronephrosis or solid mass.  Bladder:  Bladder volume is calculated at 3-13 ml pre voiding.  No bladder wall thickening or filling defects.  IMPRESSION: Normal appearance of the right kidney and bladder.  Nodular echogenic parenchymal pattern on the left suggesting medical renal disease versus vascular disease.  Small nonobstructing stone in the lower pole of the left kidney.  Original Report Authenticated By: Marlon Pel, M.D.    Scheduled Meds:    . ciprofloxacin  400 mg Intravenous Q24H  . metronidazole  500 mg Intravenous Q8H  . pneumococcal 23 valent vaccine  0.5 mL Intramuscular Tomorrow-1000  . DISCONTD: albuterol  2.5 mg Nebulization Q6H  . DISCONTD: ciprofloxacin  400 mg Intravenous Q12H  . DISCONTD: ipratropium  0.5 mg Nebulization Q6H   Continuous Infusions:    . dextrose 5 % and 0.9% NaCl 150 mL/hr at 03/30/12 0335    Active Problems:  TOBACCO ABUSE  Anticoagulant long-term use  Renal failure, acute  Colitis  Abdominal pain  Leucocytosis  Leg ulcer, left     Ayannah Faddis,CHRISTOPHER  [facility] Pager [phone]. If 8PM-8AM, please contact night-coverage at [email or web address], password [facility] 03/30/2012, 1:49 PM  LOS: 2 days

## 2012-03-30 NOTE — Progress Notes (Signed)
     [facility] Daily Rounding Note 03/30/2012, 12:39 PM  SUBJECTIVE:       Stools no longer bloody, but still frequent and watery.  Only minor blood with wiping.  Still with mid abd pain radiates diffusely into abdomen and into back.  OBJECTIVE:         Vital signs in last 24 hours:    Temp:  [98.3 F (36.8 C)-98.9 F (37.2 C)] 98.3 F (36.8 C) (07/18 2130) Pulse Rate:  [81-97] 81  (07/18 0613) Resp:  [18-20] 20  (07/18 0613) BP: (116-142)/(65-82) 116/65 mmHg (07/18 0613) SpO2:  [93 %-96 %] 96 % (07/18 0613) Last BM Date: 03/30/12 General: non toxic.  anxious   Heart: RRR Chest: clear B Abdomen: soft, tenderness is non-focal.  No guard or rebound  Extremities: no pedal edema Neuro/Psych:  Anxious, not confused.    Intake/Output from previous day: 07/17 0701 - 07/18 0700 In: 1100 [I.V.:1000; IV Piggyback:100] Out: -   Intake/Output this shift:    Lab Results:  Basename 03/30/12 0555 03/29/12 0615 03/28/12 1203  WBC 9.2 11.6* 17.6*  HGB 13.2 14.4 16.4*  HCT 38.1 40.7 44.9  PLT 178 185 271   BMET  Basename 03/30/12 0555 03/29/12 0615 03/28/12 1203  NA 136 134* 131*  K 3.4* 3.7 3.8  CL 102 98 92*  CO2 23 24 22   GLUCOSE 105* 104* 113*  BUN 29* 49* 51*  CREATININE 2.60* 4.61* 5.40*  CALCIUM 7.4* 7.9* 9.0   LFT  Basename 03/30/12 0555 03/29/12 0615 03/28/12 1203  PROT 6.2 6.7 7.8  ALBUMIN 2.8* 2.8* 3.3*  AST 17 16 19   ALT 17 21 30   ALKPHOS 82 107 102  BILITOT 0.5 0.5 0.6  BILIDIR -- -- --  IBILI -- -- --   PT/INR  Basename 03/29/12 0615 03/28/12 1203  LABPROT 23.2* 22.0*  INR 2.02* 1.89*    ASSESMENT: *  Bloody diarrhea, abd pain, colitis by CT.  Presumed ischemic source but empirically covered with Cipro and Flagyl . Bloody stool resolved.  Still  With pain and diarrhea.    PLAN: *  Supportive care with IVF, pain and nausea meds PRN, clear liquids.    LOS: 2 days   Jennye Moccasin  03/30/2012, 12:39 PM Pager: [phone]

## 2012-03-30 NOTE — Progress Notes (Signed)
Patient seen, examined, and I agree with the above documentation, including the assessment and plan. Supportive care for presumed ischemic colitis. Will need colonoscopy, but this can be done as an outpt within 4-6 weeks.  I would like to see her in clinic 1st.

## 2012-03-31 ENCOUNTER — Encounter: Payer: Self-pay | Admitting: Internal Medicine

## 2012-03-31 LAB — COMPREHENSIVE METABOLIC PANEL
ALT: 16 U/L (ref 0–35)
AST: 18 U/L (ref 0–37)
CO2: 21 mEq/L (ref 19–32)
Chloride: 102 mEq/L (ref 96–112)
Creatinine, Ser: 1.34 mg/dL — ABNORMAL HIGH (ref 0.50–1.10)
GFR calc non Af Amer: 44 mL/min — ABNORMAL LOW (ref >90)
Sodium: 134 mEq/L — ABNORMAL LOW (ref 135–145)
Total Bilirubin: 0.5 mg/dL (ref 0.3–1.2)

## 2012-03-31 LAB — CBC
MCV: 93.9 fL (ref 78.0–100.0)
Platelets: 197 10*3/uL (ref 150–400)
RBC: 3.92 MIL/uL (ref 3.87–5.11)
WBC: 8.6 10*3/uL (ref 4.0–10.5)

## 2012-03-31 MED ORDER — WARFARIN - PHARMACIST DOSING INPATIENT
Freq: Every day | Status: DC
  Administered 2012-03-31 – 2012-04-01 (×2)

## 2012-03-31 MED ORDER — WARFARIN SODIUM 7.5 MG PO TABS
7.5000 mg | ORAL_TABLET | Freq: Once | ORAL | Status: AC
  Administered 2012-03-31: 7.5 mg via ORAL
  Filled 2012-03-31: qty 1

## 2012-03-31 MED ORDER — POTASSIUM CHLORIDE CRYS ER 20 MEQ PO TBCR
40.0000 meq | EXTENDED_RELEASE_TABLET | Freq: Once | ORAL | Status: AC
  Administered 2012-03-31: 40 meq via ORAL

## 2012-03-31 NOTE — Progress Notes (Signed)
[facility] PROGRESS NOTE  Brooke Garcia JXB:[id number] DOB: Jun 13, 1956 DOA: 03/28/2012 PCP: Sanda Linger, MD  Assessment/Plan: Active Problems:  TOBACCO ABUSE  Anticoagulant long-term use  Renal failure, acute  Colitis  Abdominal pain  Leucocytosis  Leg ulcer, left  1. Acute renal failure: Patient resented with a creatine of 5.4, consistent with ARF, against known baseline creatinine of <1.0 in early July, 2013. This is likely predominantly pre-renal, due to volume depletion caused by GI fluid loss,and dehydration, against a background of NSAID/Cox 2 inhibitor therapy. Managing with aggressive iv fluids, and have discontinued nephrotoxins. Renal ultrasound showed normal appearance of the right kidney and bladder. Nodular echogenic parenchymal pattern on the left suggesting medical renal disease versus vascular disease. Small nonobstructing stone in the lower pole of the left kidney. Clinical improvement has been steady. Today, creatinine is normal at 1.34. Will discontinue iv fluids at midnight.  2. Acute Colitis: Abdominal/Pelvic CT confirmed mild - moderate wall thickening and stranding involving the transverse colon and proximal descending colon without evidence of pneumatosis or pneumoperitoneum. This is possibly infectious, although patient denies ingestion of unusual foods or clustering of cases. Ischemic colitis is also a possiblity although patient is on Coumadin and her bloody stools may be due to Coumadin-induced coagulopathy, against a background of intestinal mucosal friability, due to inflammation. On empirically Cipro/Flagyl now day#3. C. difficile PCR is negative, other stool studies are also negative. GI consult was provided by Dr Erick Blinks, who appears to favor ischemic etiology and will likely scope patient as outpatient, on discharge. Patient is tolerating low residue diet. We have restarted Coumadin today. Antibiotics have been discontinued.  3. Tobacco abuse: Patient has  been counseled appropriately.  4. left lower extremity venous ulcer: This is chronic, over the last 2 years, and patient attends the wound clinic once a week, for Unna wrap. Patient has been evaluated by the wound care team. Zinc ointment applied to periwound edges after cleansing, then silver hydrofiber applied over wound bed. This was covered by foam silicone dressing, abd pads, kerlex, and acewrap to provide light compression from behind toes to below knee. These are scheduled to be changed Q-week. 5. Hypertension: BP is currently normotensive, We shall observe for now.  6. Hypercoagulable state: Patient has known Protein S deficiency, and previous history of venous/arterial thromb-embolic disease. Due to her symptoms of GI bleed, Coumadin was initially held, but has been restarted as of 03/31/12.   Code Status: Full Code Family Communication:  Disposition Plan: For discharge home, when stable.    Brief narrative: Presented to [facility] with complaint of persistent abdominal pain nausea vomiting for about 5 days, followed by bloody diarrhea. Workup showed creatinine of 5. It was less than 1.0 in July of this year. Patient also was found to have findings on CT scan consistent with colitis. She denied any sick contacts. She is on chronic Coumadin, secondary to repeated DVTs and hypercoagulable state.   Consultants:  GI: [place].  Procedures:  N/A.  Antibiotics: Ciprofloxacin/Flagyl 03/29/12-03/31/12.Marland Kitchen   HPI/Subjective: Stool is more formed today.  Objective: Filed Vitals:   03/30/12 1840 03/30/12 2142 03/31/12 0533 03/31/12 1030  BP: 120/72 145/82 122/73 136/80  Pulse: 78 85 82 81  Temp: 97.9 F (36.6 C) 98.4 F (36.9 C) 98.5 F (36.9 C) 99.5 F (37.5 C)  TempSrc: Oral Oral Oral Oral  Resp: 18 18 18 18   Height:      Weight:      SpO2: 91% 92% 93% 97%  Intake/Output Summary (Last 24 hours) at 03/31/12 1441 Last data filed at 03/31/12 0836  Gross per 24 hour    Intake   1980 ml  Output      0 ml  Net   1980 ml    Exam: General: Comfortable, alert, communicative, fully oriented, not short of breath at rest.  HEENT:  No clinical pallor, no jaundice, no conjunctival injection or discharge. Hydraton is satisfactory. NECK:  Supple, JVP not seen, no carotid bruits, no palpable lymphadenopathy, no palpable goiter. CHEST:  Clinically clear to auscultation, no wheezes, no crackles. HEART:  Sounds 1 and 2 heard, normal, regular, no murmurs. ABDOMEN:  Full, soft, only mild abdominal tenderness, no palpable organomegaly, no palpable masses, normal bowel sounds. GENITALIA:  Not examined. LOWER EXTREMITIES:  RLE: No pitting edema, palpable peripheral pulses. LLE: In Unna wrap.  MUSCULOSKELETAL SYSTEM:  Unremarkable. CENTRAL NERVOUS SYSTEM:  No focal neurologic deficit on gross examination.  Data Reviewed: Basic Metabolic Panel:  Lab 03/31/12 4098 03/30/12 0555 03/29/12 0615 03/28/12 1203  NA 134* 136 134* 131*  K 3.3* 3.4* 3.7 3.8  CL 102 102 98 92*  CO2 21 23 24 22   GLUCOSE 111* 105* 104* 113*  BUN 13 29* 49* 51*  CREATININE 1.34* 2.60* 4.61* 5.40*  CALCIUM 7.3* 7.4* 7.9* 9.0  MG -- -- -- --  PHOS -- -- -- --   Liver Function Tests:  Lab 03/31/12 0620 03/30/12 0555 03/29/12 0615 03/28/12 1203  AST 18 17 16 19   ALT 16 17 21 30   ALKPHOS 84 82 107 102  BILITOT 0.5 0.5 0.5 0.6  PROT 6.4 6.2 6.7 7.8  ALBUMIN 2.7* 2.8* 2.8* 3.3*   No results found for this basename: LIPASE:5,AMYLASE:5 in the last 168 hours No results found for this basename: AMMONIA:5 in the last 168 hours CBC:  Lab 03/31/12 0620 03/30/12 0555 03/29/12 0615 03/28/12 1203  WBC 8.6 9.2 11.6* 17.6*  NEUTROABS -- -- -- 12.9*  HGB 13.0 13.2 14.4 16.4*  HCT 36.8 38.1 40.7 44.9  MCV 93.9 93.6 93.1 90.7  PLT 197 178 185 271   Cardiac Enzymes: No results found for this basename: CKTOTAL:5,CKMB:5,CKMBINDEX:5,TROPONINI:5 in the last 168 hours BNP (last 3 results) No results  found for this basename: PROBNP:3 in the last 8760 hours CBG: No results found for this basename: GLUCAP:5 in the last 168 hours  Recent Results (from the past 240 hour(s))  STOOL CULTURE     Status: Normal (Preliminary result)   Collection Time   03/29/12 11:14 AM      Component Value Range Status Comment   Specimen Description STOOL   Final    Special Requests NONE   Final    Culture NO SUSPICIOUS COLONIES, CONTINUING TO HOLD   Final    Report Status PENDING   Incomplete   CLOSTRIDIUM DIFFICILE BY PCR     Status: Normal   Collection Time   03/30/12 10:13 PM      Component Value Range Status Comment   C difficile by pcr NEGATIVE  NEGATIVE Final      Studies: Ct Abdomen Pelvis Wo Contrast  03/28/2012  *RADIOLOGY REPORT*  Clinical Data: Abdominal pain, vomited, bloody stools  CT ABDOMEN AND PELVIS WITHOUT CONTRAST  Technique:  Multidetector CT imaging of the abdomen and pelvis was performed following the standard protocol without intravenous contrast.  Comparison: October 08, 2010  Findings: There is pericolonic stranding and colonic wall thickening involving the transverse colon and proximal descending colon.  Stranding is also present adjacent to the pancreatic tail which is located directly superior to the loop of inflamed bowel. There is no gross pneumatosis or pneumoperitoneum.  There is no free fluid or adenopathy within the abdomen or pelvis.  The appendix is seen in the right lower quadrant and has a normal appearance.  There is mild atelectasis / scarring within the left lung base. Gallstones are noted within the gallbladder lumen.  The liver, spleen, adrenal glands, kidneys, urinary bladder, uterus and adnexa, osseous structures have an unremarkable noncontrasted appearance.  IMPRESSION: There is mild - moderate wall thickening and stranding involving the transverse colon and proximal descending colon without evidence of pneumatosis or pneumoperitoneum.  This is likely secondary to  infectious colitis, though ischemic colitis cannot be excluded on this noncontrasted study.  Cholelithiasis.  Original Report Authenticated By: Brandon Melnick, M.D.   US Renal  03/28/2012  *RADIOLOGY REPORT*  Clinical Data: Acute renal failure.  RENAL/URINARY TRACT ULTRASOUND COMPLETE  Comparison:  CT abdomen and pelvis 03/28/2012  Findings:  Right Kidney:  Right kidney measures 13 cm length.  Normal parenchymal echotexture.  No focal mass or hydronephrosis.  Left Kidney:  Left kidney measures 12 cm length.  The renal parenchymal echotexture is nodular and hyperechoic.  This suggests medical renal disease versus small vessel disease.  There is a 3 mm shadowing echogenic focus in the lower pole consistent with a stone.  No hydronephrosis or solid mass.  Bladder:  Bladder volume is calculated at 3-13 ml pre voiding.  No bladder wall thickening or filling defects.  IMPRESSION: Normal appearance of the right kidney and bladder.  Nodular echogenic parenchymal pattern on the left suggesting medical renal disease versus vascular disease.  Small nonobstructing stone in the lower pole of the left kidney.  Original Report Authenticated By: Marlon Pel, M.D.    Scheduled Meds:    . ciprofloxacin  400 mg Intravenous Q24H  . metronidazole  500 mg Intravenous Q8H  . potassium chloride  40 mEq Oral Daily  . warfarin  7.5 mg Oral ONCE-1800  . Warfarin - Pharmacist Dosing Inpatient   Does not apply q1800   Continuous Infusions:    . dextrose 5 % and 0.9% NaCl 150 mL/hr at 03/31/12 1323    Active Problems:  TOBACCO ABUSE  Anticoagulant long-term use  Renal failure, acute  Colitis  Abdominal pain  Leucocytosis  Leg ulcer, left     Brooke Garcia,CHRISTOPHER  [facility] Pager [phone]. If 8PM-8AM, please contact night-coverage at [email or web address], password [facility] 03/31/2012, 2:41 PM  LOS: 3 days

## 2012-03-31 NOTE — Progress Notes (Signed)
ANTICOAGULATION CONSULT NOTE - Follow Up Consult  Pharmacy Consult for Coumadin Indication:History of hypercoagulopathy     Allergies  Allergen Reactions  . Butalbital-Apap-Caffeine Hives  . Naproxen Hives  . Tylenol (Acetaminophen) Other (See Comments)    Upset stomach    Patient Measurements: Height: 5\' 4"  (162.6 cm) Weight: 191 lb 4.8 oz (86.773 kg) IBW/kg (Calculated) : 54.7    Vital Signs: Temp: 99.5 F (37.5 C) (07/19 1030) Temp src: Oral (07/19 1030) BP: 136/80 mmHg (07/19 1030) Pulse Rate: 81  (07/19 1030)  Labs:  Basename 03/31/12 0620 03/30/12 0555 03/29/12 0615  HGB 13.0 13.2 --  HCT 36.8 38.1 40.7  PLT 197 178 185  APTT -- -- --  LABPROT 20.7* -- 23.2*  INR 1.74* -- 2.02*  HEPARINUNFRC -- -- --  CREATININE 1.34* 2.60* 4.61*  CKTOTAL -- -- --  CKMB -- -- --  TROPONINI -- -- --    Estimated Creatinine Clearance: 50.5 ml/min (by C-G formula based on Cr of 1.34).   Medications:  Scheduled:    . ciprofloxacin  400 mg Intravenous Q24H  . metronidazole  500 mg Intravenous Q8H  . potassium chloride  40 mEq Oral Daily    Assessment: 56 yo female with history of hypercoagulopathy (Protein S deficiency) would like to "commence anticoagulation on 03/31/12." Coumadin has been on hold due to sxs of GI bleed. INR on 7/19 of 1.74,  CBC ok though Hgb trending down  Goal of Therapy:  INR 2-3 Monitor platelets by anticoagulation protocol: Yes   Plan:  Warfarin 7.5 mg today Daily PT/INR  Lucille Passy 03/31/2012,12:18 PM

## 2012-04-01 ENCOUNTER — Inpatient Hospital Stay (HOSPITAL_COMMUNITY): Payer: Medicaid Other

## 2012-04-01 DIAGNOSIS — D6859 Other primary thrombophilia: Secondary | ICD-10-CM

## 2012-04-01 LAB — CBC
HCT: 40.7 % (ref 36.0–46.0)
Hemoglobin: 14.4 g/dL (ref 12.0–15.0)
MCH: 33.2 pg (ref 26.0–34.0)
MCV: 93.8 fL (ref 78.0–100.0)
Platelets: 241 10*3/uL (ref 150–400)
RBC: 4.34 MIL/uL (ref 3.87–5.11)
WBC: 9.6 10*3/uL (ref 4.0–10.5)

## 2012-04-01 LAB — BASIC METABOLIC PANEL
BUN: 5 mg/dL — ABNORMAL LOW (ref 6–23)
CO2: 21 mEq/L (ref 19–32)
Calcium: 7.7 mg/dL — ABNORMAL LOW (ref 8.4–10.5)
Chloride: 102 mEq/L (ref 96–112)
Creatinine, Ser: 1.07 mg/dL (ref 0.50–1.10)
Glucose, Bld: 94 mg/dL (ref 70–99)

## 2012-04-01 MED ORDER — OXYCODONE HCL 5 MG PO TABS
5.0000 mg | ORAL_TABLET | ORAL | Status: DC | PRN
  Administered 2012-04-02: 5 mg via ORAL
  Filled 2012-04-01: qty 1

## 2012-04-01 MED ORDER — HYDROMORPHONE HCL PF 1 MG/ML IJ SOLN
2.0000 mg | Freq: Four times a day (QID) | INTRAMUSCULAR | Status: DC | PRN
  Administered 2012-04-01 – 2012-04-02 (×3): 2 mg via INTRAVENOUS
  Filled 2012-04-01 (×3): qty 2

## 2012-04-01 MED ORDER — WARFARIN SODIUM 7.5 MG PO TABS
7.5000 mg | ORAL_TABLET | Freq: Once | ORAL | Status: AC
  Administered 2012-04-01: 7.5 mg via ORAL
  Filled 2012-04-01: qty 1

## 2012-04-01 NOTE — Progress Notes (Signed)
[facility] PROGRESS NOTE  Brooke Garcia ZOX:[id number] DOB: 03/04/1956 DOA: 03/28/2012 PCP: Sanda Linger, MD  Assessment/Plan: Active Problems:  TOBACCO ABUSE  Anticoagulant long-term use  Renal failure, acute  Colitis  Abdominal pain  Leucocytosis  Leg ulcer, left  1. Acute renal failure: Patient resented with a creatine of 5.4, consistent with ARF, against known baseline creatinine of <1.0 in early July, 2013. This is likely predominantly pre-renal, due to volume depletion caused by GI fluid loss,and dehydration, against a background of NSAID/Cox 2 inhibitor therapy. Managing with aggressive iv fluids, and have discontinued nephrotoxins. Renal ultrasound showed normal appearance of the right kidney and bladder. Nodular echogenic parenchymal pattern on the left suggesting medical renal disease versus vascular disease. Small nonobstructing stone in the lower pole of the left kidney. Clinical improvement has been steady. Today, creatinine is normal at 1.07. IV fluids were discontinued on 03/31/12. 2. Acute Colitis: Abdominal/Pelvic CT confirmed mild - moderate wall thickening and stranding involving the transverse colon and proximal descending colon without evidence of pneumatosis or pneumoperitoneum. This is possibly infectious, although patient denies ingestion of unusual foods or clustering of cases. Ischemic colitis is also a possiblity although patient is on Coumadin and her bloody stools may be due to Coumadin-induced coagulopathy, against a background of intestinal mucosal friability, due to inflammation. Treated with empiric Cipro/Flagyl, till 03/31/12. C. difficile PCR is negative, other stool studies are also negative. GI consult was provided by Dr Erick Blinks, who appears to favor ischemic etiology and will likely scope patient as outpatient, on discharge. Patient is tolerating low residue diet. We have restarted Coumadin on 03/31/12. Antibiotics have been discontinued. Patient still has  mild upper abdominal pain and tenderness.Will do abdominal x-ray today.  3. Tobacco abuse: Patient has been counseled appropriately.  4. left lower extremity venous ulcer: This is chronic, over the last 2 years, and patient attends the wound clinic once a week, for Unna wrap. Patient has been evaluated by the wound care team. Zinc ointment applied to periwound edges after cleansing, then silver hydrofiber applied over wound bed. This was covered by foam silicone dressing, abd pads, kerlex, and acewrap to provide light compression from behind toes to below knee. These are scheduled to be changed Q-week. 5. Hypertension: BP is currently normotensive. 6. Hypercoagulable state: Patient has known Protein S deficiency, and previous history of venous/arterial thromb-embolic disease. Due to her symptoms of GI bleed, Coumadin was initially held, but has been restarted as of 03/31/12.   Code Status: Full Code Family Communication:  Disposition Plan: Nearing discharge, possibly next day or two.    Brief narrative: Presented to [facility] with complaint of persistent abdominal pain nausea vomiting for about 5 days, followed by bloody diarrhea. Workup showed creatinine of 5. It was less than 1.0 in July of this year. Patient also was found to have findings on CT scan consistent with colitis. She denied any sick contacts. She is on chronic Coumadin, secondary to repeated DVTs and hypercoagulable state.   Consultants:  GI: Opp.  Procedures:  N/A.  Antibiotics: Ciprofloxacin/Flagyl 03/29/12-03/31/12.Marland Kitchen   HPI/Subjective: Steady clinical improvement. No diarrhea. Still complains of abdominal discomfort.  Objective: Filed Vitals:   03/31/12 2101 04/01/12 0516 04/01/12 0933 04/01/12 1311  BP: 118/78 136/77 127/70 133/73  Pulse: 77 74 77 80  Temp: 99 F (37.2 C) 98.7 F (37.1 C) 98.7 F (37.1 C) 98.8 F (37.1 C)  TempSrc: Oral Oral Oral Oral  Resp: 18 20 18 19   Height:  Weight:  90.9 kg (200 lb 6.4 oz)     SpO2: 99% 97% 100% 100%    Intake/Output Summary (Last 24 hours) at 04/01/12 1457 Last data filed at 04/01/12 1312  Gross per 24 hour  Intake   4760 ml  Output   1050 ml  Net   3710 ml    Exam: General: Comfortable, alert, communicative, fully oriented, not short of breath at rest.  HEENT:  No clinical pallor, no jaundice, no conjunctival injection or discharge. Hydraton is satisfactory. NECK:  Supple, JVP not seen, no carotid bruits, no palpable lymphadenopathy, no palpable goiter. CHEST:  Clinically clear to auscultation, no wheezes, no crackles. HEART:  Sounds 1 and 2 heard, normal, regular, no murmurs. ABDOMEN:  Full, soft, mild abdominal tenderness, no palpable organomegaly, no palpable masses, normal bowel sounds. GENITALIA:  Not examined. LOWER EXTREMITIES:  RLE: No pitting edema, palpable peripheral pulses. LLE: In Unna wrap.  MUSCULOSKELETAL SYSTEM:  Unremarkable. CENTRAL NERVOUS SYSTEM:  No focal neurologic deficit on gross examination.  Data Reviewed: Basic Metabolic Panel:  Lab 04/01/12 6213 03/31/12 0620 03/30/12 0555 03/29/12 0615 03/28/12 1203  NA 135 134* 136 134* 131*  K 3.4* 3.3* 3.4* 3.7 3.8  CL 102 102 102 98 92*  CO2 21 21 23 24 22   GLUCOSE 94 111* 105* 104* 113*  BUN 5* 13 29* 49* 51*  CREATININE 1.07 1.34* 2.60* 4.61* 5.40*  CALCIUM 7.7* 7.3* 7.4* 7.9* 9.0  MG -- -- -- -- --  PHOS -- -- -- -- --   Liver Function Tests:  Lab 03/31/12 0620 03/30/12 0555 03/29/12 0615 03/28/12 1203  AST 18 17 16 19   ALT 16 17 21 30   ALKPHOS 84 82 107 102  BILITOT 0.5 0.5 0.5 0.6  PROT 6.4 6.2 6.7 7.8  ALBUMIN 2.7* 2.8* 2.8* 3.3*   No results found for this basename: LIPASE:5,AMYLASE:5 in the last 168 hours No results found for this basename: AMMONIA:5 in the last 168 hours CBC:  Lab 04/01/12 0500 03/31/12 0620 03/30/12 0555 03/29/12 0615 03/28/12 1203  WBC 9.6 8.6 9.2 11.6* 17.6*  NEUTROABS -- -- -- -- 12.9*  HGB 14.4 13.0 13.2  14.4 16.4*  HCT 40.7 36.8 38.1 40.7 44.9  MCV 93.8 93.9 93.6 93.1 90.7  PLT 241 197 178 185 271   Cardiac Enzymes: No results found for this basename: CKTOTAL:5,CKMB:5,CKMBINDEX:5,TROPONINI:5 in the last 168 hours BNP (last 3 results) No results found for this basename: PROBNP:3 in the last 8760 hours CBG: No results found for this basename: GLUCAP:5 in the last 168 hours  Recent Results (from the past 240 hour(s))  STOOL CULTURE     Status: Normal (Preliminary result)   Collection Time   03/29/12 11:14 AM      Component Value Range Status Comment   Specimen Description STOOL   Final    Special Requests NONE   Final    Culture NO SUSPICIOUS COLONIES, CONTINUING TO HOLD   Final    Report Status PENDING   Incomplete   CLOSTRIDIUM DIFFICILE BY PCR     Status: Normal   Collection Time   03/30/12 10:13 PM      Component Value Range Status Comment   C difficile by pcr NEGATIVE  NEGATIVE Final      Studies: Ct Abdomen Pelvis Wo Contrast  03/28/2012  *RADIOLOGY REPORT*  Clinical Data: Abdominal pain, vomited, bloody stools  CT ABDOMEN AND PELVIS WITHOUT CONTRAST  Technique:  Multidetector CT imaging of the abdomen and  pelvis was performed following the standard protocol without intravenous contrast.  Comparison: October 08, 2010  Findings: There is pericolonic stranding and colonic wall thickening involving the transverse colon and proximal descending colon.  Stranding is also present adjacent to the pancreatic tail which is located directly superior to the loop of inflamed bowel. There is no gross pneumatosis or pneumoperitoneum.  There is no free fluid or adenopathy within the abdomen or pelvis.  The appendix is seen in the right lower quadrant and has a normal appearance.  There is mild atelectasis / scarring within the left lung base. Gallstones are noted within the gallbladder lumen.  The liver, spleen, adrenal glands, kidneys, urinary bladder, uterus and adnexa, osseous structures have an  unremarkable noncontrasted appearance.  IMPRESSION: There is mild - moderate wall thickening and stranding involving the transverse colon and proximal descending colon without evidence of pneumatosis or pneumoperitoneum.  This is likely secondary to infectious colitis, though ischemic colitis cannot be excluded on this noncontrasted study.  Cholelithiasis.  Original Report Authenticated By: Brandon Melnick, M.D.   US Renal  03/28/2012  *RADIOLOGY REPORT*  Clinical Data: Acute renal failure.  RENAL/URINARY TRACT ULTRASOUND COMPLETE  Comparison:  CT abdomen and pelvis 03/28/2012  Findings:  Right Kidney:  Right kidney measures 13 cm length.  Normal parenchymal echotexture.  No focal mass or hydronephrosis.  Left Kidney:  Left kidney measures 12 cm length.  The renal parenchymal echotexture is nodular and hyperechoic.  This suggests medical renal disease versus small vessel disease.  There is a 3 mm shadowing echogenic focus in the lower pole consistent with a stone.  No hydronephrosis or solid mass.  Bladder:  Bladder volume is calculated at 3-13 ml pre voiding.  No bladder wall thickening or filling defects.  IMPRESSION: Normal appearance of the right kidney and bladder.  Nodular echogenic parenchymal pattern on the left suggesting medical renal disease versus vascular disease.  Small nonobstructing stone in the lower pole of the left kidney.  Original Report Authenticated By: Marlon Pel, M.D.    Scheduled Meds:    . potassium chloride  40 mEq Oral Daily  . potassium chloride  40 mEq Oral Once  . warfarin  7.5 mg Oral ONCE-1800  . warfarin  7.5 mg Oral ONCE-1800  . Warfarin - Pharmacist Dosing Inpatient   Does not apply q1800   Continuous Infusions:    . dextrose 5 % and 0.9% NaCl 150 mL/hr at 03/31/12 1323    Active Problems:  TOBACCO ABUSE  Anticoagulant long-term use  Renal failure, acute  Colitis  Abdominal pain  Leucocytosis  Leg ulcer, left     Alfredo Collymore,CHRISTOPHER  [facility] Pager [phone]. If 8PM-8AM, please contact night-coverage at [email or web address], password [facility] 04/01/2012, 2:57 PM  LOS: 4 days

## 2012-04-01 NOTE — Progress Notes (Signed)
ANTICOAGULATION CONSULT NOTE - Follow Up Consult  Pharmacy Consult for Coumadin Indication: History of hypercoagulopathy   Allergies  Allergen Reactions  . Butalbital-Apap-Caffeine Hives  . Naproxen Hives  . Tylenol (Acetaminophen) Other (See Comments)    Upset stomach    Patient Measurements: Height: 5\' 4"  (162.6 cm) Weight: 200 lb 6.4 oz (90.9 kg) IBW/kg (Calculated) : 54.7    Vital Signs: Temp: 98.7 F (37.1 C) (07/20 0933) Temp src: Oral (07/20 0933) BP: 127/70 mmHg (07/20 0933) Pulse Rate: 77  (07/20 0933)  Labs:  Basename 04/01/12 0500 03/31/12 0620 03/30/12 0555  HGB 14.4 13.0 --  HCT 40.7 36.8 38.1  PLT 241 197 178  APTT -- -- --  LABPROT 18.8* 20.7* --  INR 1.54* 1.74* --  HEPARINUNFRC -- -- --  CREATININE 1.07 1.34* 2.60*  CKTOTAL -- -- --  CKMB -- -- --  TROPONINI -- -- --    Estimated Creatinine Clearance: 64.9 ml/min (by C-G formula based on Cr of 1.07).    Assessment: 56 yo female with history of hypercoagulopathy (Protein S deficiency) would like to "commence anticoagulation on 03/31/12." Coumadin has been on hold due to sxs of GI bleed. INR on 7/19 of 1.74, CBC ok though Hgb trending down  todays INR-1.54   Goal of Therapy:  INR 2-3 Monitor platelets by anticoagulation protocol: Yes   Plan:  Warfarin 7.5 mg today  Daily PT/INR   Lucille Passy 04/01/2012,12:38 PM

## 2012-04-02 LAB — PROTIME-INR: Prothrombin Time: 21 seconds — ABNORMAL HIGH (ref 11.6–15.2)

## 2012-04-02 LAB — BASIC METABOLIC PANEL
BUN: 4 mg/dL — ABNORMAL LOW (ref 6–23)
CO2: 23 mEq/L (ref 19–32)
Calcium: 7.4 mg/dL — ABNORMAL LOW (ref 8.4–10.5)
GFR calc non Af Amer: 64 mL/min — ABNORMAL LOW (ref >90)
Glucose, Bld: 100 mg/dL — ABNORMAL HIGH (ref 70–99)
Sodium: 138 mEq/L (ref 135–145)

## 2012-04-02 LAB — CBC
HCT: 36.7 % (ref 36.0–46.0)
Hemoglobin: 12.9 g/dL (ref 12.0–15.0)
MCH: 32.7 pg (ref 26.0–34.0)
MCHC: 35.1 g/dL (ref 30.0–36.0)
MCV: 92.9 fL (ref 78.0–100.0)
RBC: 3.95 MIL/uL (ref 3.87–5.11)

## 2012-04-02 LAB — STOOL CULTURE

## 2012-04-02 MED ORDER — TIOTROPIUM BROMIDE MONOHYDRATE 18 MCG IN CAPS
18.0000 ug | ORAL_CAPSULE | Freq: Every day | RESPIRATORY_TRACT | Status: DC
  Filled 2012-04-02: qty 5

## 2012-04-02 MED ORDER — OXYCODONE HCL 5 MG PO TABS: 5.0000 mg | ORAL_TABLET | ORAL | Status: DC | PRN

## 2012-04-02 NOTE — Discharge Summary (Addendum)
Physician Discharge Summary  Brooke Garcia ZOX:[id number] DOB: 02/08/1956 DOA: 03/28/2012  PCP: Sanda Linger, MD  Admit date: 03/28/2012 Discharge date: 04/02/2012  Recommendations for Outpatient Follow-up:  Follow up with primary MD, and with Dr Erick Blinks, gastroenterologist.  Discharge Diagnoses:  Active Problems:  TOBACCO ABUSE  Anticoagulant long-term use  Renal failure, acute  Colitis  Abdominal pain  Leucocytosis  Leg ulcer, left    Discharge Condition: Satisfactory.  Diet recommendation: Low residue.   History of present illness:  Presented to [facility] with complaint of persistent abdominal pain nausea vomiting for about 5 days, followed by bloody diarrhea. Workup showed creatinine of 5. It was less than 1.0 in July of this year. Patient also was found to have findings on CT scan consistent with colitis. She denied any sick contacts. She is on chronic Coumadin, secondary to repeated DVTs and hypercoagulable state.    Hospital Course:  1. Acute renal failure: Patient resented with a creatine of 5.4, consistent with ARF, against known baseline creatinine of <1.0 in early July, 2013. This is likely predominantly pre-renal, due to volume depletion caused by GI fluid loss,and dehydration, against a background of NSAID/Cox 2 inhibitor therapy. She was managed with aggressive iv fluids, and nephrotoxins were discontinued. Renal ultrasound showed normal appearance of the right kidney and bladder. Nodular echogenic parenchymal pattern on the left suggesting medical renal disease versus vascular disease. Small nonobstructing stone in the lower pole of the left kidney. Clinical improvement has been steady. On 04/02/12, creatinine was normal at 0.98. IV fluids were discontinued on 03/31/12.  2. Acute Colitis: Abdominal/Pelvic CT confirmed mild - moderate wall thickening and stranding involving the transverse colon and proximal descending colon without evidence of pneumatosis or  pneumoperitoneum. This was possibly infectious, although patient denies ingestion of unusual foods or clustering of cases. Ischemic colitis is also a possiblity although patient is on Coumadin and her bloody stools may have been due to Coumadin-induced coagulopathy, against a background of intestinal mucosal friability, due to inflammation. She was treated with empiric Cipro/Flagyl, till 03/31/12. C. difficile PCR was negative, as were other stool studies. GI consult was provided by Dr Erick Blinks, who appears to favor ischemic etiology and will likely scope patient as outpatient, on discharge. Patient was able to tolerate low residue diet, without deleterious effect, abdominal X-Ray of 04/01/12, showed transverse colonic wall less thickened compared with CTexam, and as of 04/02/12, she was practically asymptomatic. Wcc, which was 17.6 at presentation, had normalized to 9.7, by 04/02/12.   3. Tobacco abuse: Patient has been counseled appropriately.  4. left lower extremity venous ulcer: This is chronic over the last 2 years, and patient attends the wound clinic once a week, for Unna wrap. Patient has been evaluated by the wound care team. Zinc ointment applied to periwound edges after cleansing, then silver hydrofiber applied over wound bed. This was covered by foam silicone dressing, abd pads, kerlex, and acewrap to provide light compression from behind toes to below knee. These are scheduled to be changed Q-week.  5. Hypertension: BP is currently normotensive.  6. Hypercoagulable state: Patient has known Protein S deficiency, and previous history of venous/arterial thromb-embolic disease. Due to her symptoms of GI bleed, Coumadin was initially held, but has been restarted as of 03/31/12.   Procedures:  Abdominal/Pelvic CT scan.  Abdominal X-Ray.   Consultations:  Dr Erick Blinks, gastroenterologist.   Discharge Exam: Filed Vitals:   04/02/12 1007  BP: 119/67  Pulse: 75  Temp:  98.2 F (36.8 C)    Resp: 18   Filed Vitals:   04/01/12 2206 04/02/12 0514 04/02/12 0859 04/02/12 1007  BP: 125/63 122/73  119/67  Pulse: 77 69  75  Temp: 99 F (37.2 C) 98.6 F (37 C)  98.2 F (36.8 C)  TempSrc: Oral Oral  Oral  Resp: 20 18  18   Height:      Weight: 88.451 kg (195 lb)     SpO2: 100% 99% 98% 95%   General: Comfortable, alert, communicative, fully oriented, not short of breath at rest.  HEENT: No clinical pallor, no jaundice, no conjunctival injection or discharge. Hydraton is satisfactory.  NECK: Supple, JVP not seen, no carotid bruits, no palpable lymphadenopathy, no palpable goiter.  CHEST: Clinically clear to auscultation, no wheezes, no crackles.  HEART: Sounds 1 and 2 heard, normal, regular, no murmurs.  ABDOMEN: Full, soft, mild abdominal tenderness, no palpable organomegaly, no palpable masses, normal bowel sounds.  GENITALIA: Not examined.  LOWER EXTREMITIES: RLE: No pitting edema, palpable peripheral pulses. LLE: In Unna wrap.  MUSCULOSKELETAL SYSTEM: Unremarkable.  CENTRAL NERVOUS SYSTEM: No focal neurologic deficit on gross examination.  Discharge Instructions  Discharge Orders    Future Appointments: Provider: Department: Dept Phone: Center:   04/18/2012 8:15 AM [facility] [phone] [facility]   05/03/2012 9:45 AM Beverley Fiedler, MD [facility] [phone] [facility]     Future Orders Please Complete By Expires   Diet - low sodium heart healthy      Comments:   Low residue.   Increase activity slowly        Medication List  As of 04/02/2012  1:04 PM   TAKE these medications         alprazolam 2 MG tablet   Commonly known as: XANAX   Take 1 tablet (2 mg total) by mouth 3 (three) times daily as needed for sleep.      alprazolam 2 MG tablet   Commonly known as: XANAX   Take 1 tablet (2 mg total) by mouth 3 (three) times daily as needed.      amLODipine 10 MG tablet   Commonly known as: NORVASC   take 1 tablet by mouth once daily       celecoxib 200 MG capsule   Commonly known as: CELEBREX   Take 1 capsule (200 mg total) by mouth daily.      diclofenac sodium 1 % Gel   Commonly known as: VOLTAREN   Apply 1 application topically 4 (four) times daily as needed. For neck pain.      multivitamin,tx-minerals tablet   Take 1 tablet by mouth daily.      oxyCODONE 5 MG immediate release tablet   Commonly known as: Oxy IR/ROXICODONE   Take 1 tablet (5 mg total) by mouth every 4 (four) hours as needed.      tiotropium 18 MCG inhalation capsule   Commonly known as: SPIRIVA   Place 18 mcg into inhaler and inhale daily.      warfarin 7.5 MG tablet   Commonly known as: COUMADIN   take 1 tablet by mouth once daily           Follow-up Information    Follow up with Beverley Fiedler, MD on 05/03/2012. (9:45 AM.  to follow up colitis.)    Contact information:   [address] [place] [address] [phone]       Follow up with Sanda Linger, MD.  Call in 1 week.   Contact information:   [address] [facility] [address] [place], [address] [place] [address] [phone]           The results of significant diagnostics from this hospitalization (including imaging, microbiology, ancillary and laboratory) are listed below for reference.    Significant Diagnostic Studies: Ct Abdomen Pelvis Wo Contrast  03/28/2012  *RADIOLOGY REPORT*  Clinical Data: Abdominal pain, vomited, bloody stools  CT ABDOMEN AND PELVIS WITHOUT CONTRAST  Technique:  Multidetector CT imaging of the abdomen and pelvis was performed following the standard protocol without intravenous contrast.  Comparison: October 08, 2010  Findings: There is pericolonic stranding and colonic wall thickening involving the transverse colon and proximal descending colon.  Stranding is also present adjacent to the pancreatic tail which is located directly superior to the loop of inflamed bowel. There is no gross pneumatosis or pneumoperitoneum.   There is no free fluid or adenopathy within the abdomen or pelvis.  The appendix is seen in the right lower quadrant and has a normal appearance.  There is mild atelectasis / scarring within the left lung base. Gallstones are noted within the gallbladder lumen.  The liver, spleen, adrenal glands, kidneys, urinary bladder, uterus and adnexa, osseous structures have an unremarkable noncontrasted appearance.  IMPRESSION: There is mild - moderate wall thickening and stranding involving the transverse colon and proximal descending colon without evidence of pneumatosis or pneumoperitoneum.  This is likely secondary to infectious colitis, though ischemic colitis cannot be excluded on this noncontrasted study.  Cholelithiasis.  Original Report Authenticated By: Brandon Melnick, M.D.   US Renal  03/28/2012  *RADIOLOGY REPORT*  Clinical Data: Acute renal failure.  RENAL/URINARY TRACT ULTRASOUND COMPLETE  Comparison:  CT abdomen and pelvis 03/28/2012  Findings:  Right Kidney:  Right kidney measures 13 cm length.  Normal parenchymal echotexture.  No focal mass or hydronephrosis.  Left Kidney:  Left kidney measures 12 cm length.  The renal parenchymal echotexture is nodular and hyperechoic.  This suggests medical renal disease versus small vessel disease.  There is a 3 mm shadowing echogenic focus in the lower pole consistent with a stone.  No hydronephrosis or solid mass.  Bladder:  Bladder volume is calculated at 3-13 ml pre voiding.  No bladder wall thickening or filling defects.  IMPRESSION: Normal appearance of the right kidney and bladder.  Nodular echogenic parenchymal pattern on the left suggesting medical renal disease versus vascular disease.  Small nonobstructing stone in the lower pole of the left kidney.  Original Report Authenticated By: Marlon Pel, M.D.   Dg Abd 2 Views  04/01/2012  *RADIOLOGY REPORT*  Clinical Data: Colitis.  Abdominal pain.  Fluid retention. Vomiting, diarrhea.  History of  gallstones, kidney stones.  ABDOMEN - 2 VIEW  Comparison: CT of the abdomen and pelvis 03/28/2012  Findings: There is gaseous distension of nondilated loops of large and small bowel.  The transverse colon appears to have less wall thickening than on the previous CT exam.  No evidence for free intraperitoneal air.  Air fluid levels are seen within the colon, a nonspecific finding. Visualized osseous structures have a normal appearance.  IMPRESSION:  1.  Nonobstructive bowel gas pattern. 2.  Transverse colonic wall appears less thickened compared with CT exam. 3.  No free air.  Original Report Authenticated By: Patterson Hammersmith, M.D.    Microbiology: Recent Results (from the past 240 hour(s))  STOOL CULTURE     Status: Normal (  Preliminary result)   Collection Time   03/29/12 11:14 AM      Component Value Range Status Comment   Specimen Description STOOL   Final    Special Requests NONE   Final    Culture NO SUSPICIOUS COLONIES, CONTINUING TO HOLD   Final    Report Status PENDING   Incomplete   CLOSTRIDIUM DIFFICILE BY PCR     Status: Normal   Collection Time   03/30/12 10:13 PM      Component Value Range Status Comment   C difficile by pcr NEGATIVE  NEGATIVE Final      Labs: Basic Metabolic Panel:  Lab 04/02/12 9604 04/01/12 0500 03/31/12 0620 03/30/12 0555 03/29/12 0615  NA 138 135 134* 136 134*  K 3.5 3.4* 3.3* 3.4* 3.7  CL 103 102 102 102 98  CO2 23 21 21 23 24   GLUCOSE 100* 94 111* 105* 104*  BUN 4* 5* 13 29* 49*  CREATININE 0.98 1.07 1.34* 2.60* 4.61*  CALCIUM 7.4* 7.7* 7.3* 7.4* 7.9*  MG -- -- -- -- --  PHOS -- -- -- -- --   Liver Function Tests:  Lab 03/31/12 0620 03/30/12 0555 03/29/12 0615 03/28/12 1203  AST 18 17 16 19   ALT 16 17 21 30   ALKPHOS 84 82 107 102  BILITOT 0.5 0.5 0.5 0.6  PROT 6.4 6.2 6.7 7.8  ALBUMIN 2.7* 2.8* 2.8* 3.3*   No results found for this basename: LIPASE:5,AMYLASE:5 in the last 168 hours No results found for this basename: AMMONIA:5 in the  last 168 hours CBC:  Lab 04/02/12 0600 04/01/12 0500 03/31/12 0620 03/30/12 0555 03/29/12 0615 03/28/12 1203  WBC 9.7 9.6 8.6 9.2 11.6* --  NEUTROABS -- -- -- -- -- 12.9*  HGB 12.9 14.4 13.0 13.2 14.4 --  HCT 36.7 40.7 36.8 38.1 40.7 --  MCV 92.9 93.8 93.9 93.6 93.1 --  PLT 273 241 197 178 185 --   Cardiac Enzymes: No results found for this basename: CKTOTAL:5,CKMB:5,CKMBINDEX:5,TROPONINI:5 in the last 168 hours BNP: BNP (last 3 results) No results found for this basename: PROBNP:3 in the last 8760 hours CBG: No results found for this basename: GLUCAP:5 in the last 168 hours  Time coordinating discharge: 40 mins.  Signed:  Advay Volante,CHRISTOPHER  [facility] 04/02/2012, 1:04 PM

## 2012-04-03 ENCOUNTER — Telehealth: Payer: Self-pay | Admitting: Internal Medicine

## 2012-04-03 NOTE — Telephone Encounter (Signed)
Pt called for an earlier appt and for a stronger pain med. She states she's on Percocet 5mg , but it doesn't work; she reports Dr Rhea Belton gave her Dilaudid in the hospital and it helped. She states she has pain in her entire abdomen that is worse on the l side; cramping type pain. She still reports rectal bleeding as well. The bleeding is when she has a BM; sometimes she has blood on the tissue w/o a BM but she thinks she has a hemorrhoid. Explained to pt I will call her when Dr Rhea Belton calls back in about  Pain control.  We will schedule her with a mid level probably 04/13/12 d/t Dr Lauro Franklin schedule. Pt stated understanding.

## 2012-04-04 MED ORDER — OXYCODONE HCL 5 MG PO TABS: ORAL_TABLET | ORAL | Status: DC

## 2012-04-04 MED ORDER — OXYCODONE-ACETAMINOPHEN 10-325 MG PO TABS: ORAL_TABLET | ORAL | Status: DC

## 2012-04-04 NOTE — Telephone Encounter (Signed)
Notified pt I left her a script at the front desk and then she informed me she can't have apap; I'll get Brooke Cluster, NP to write a new one when she comes in, pt stated understanding.

## 2012-04-04 NOTE — Telephone Encounter (Signed)
Inc to oxycodone 10/APAP 500 q6h prn pain. OV next week to discuss outpt colonoscopy If worse, fever, chills, increased bleeding she should return to ED

## 2012-04-04 NOTE — Telephone Encounter (Signed)
Script at the front desk

## 2012-04-06 ENCOUNTER — Telehealth: Payer: Self-pay | Admitting: Internal Medicine

## 2012-04-06 NOTE — Telephone Encounter (Signed)
Explained Dr Lauro Franklin instructions to pt. She still has pain, but reports when it gets better, she will begin the Imodium. Since her appt with Dr Rhea Belton is 05/03/12, scheduled her to see Willette Cluster, NP next week, 04/12/12; pt will call for worsening or questions.

## 2012-04-06 NOTE — Telephone Encounter (Signed)
Pt reports no control with her bowels; she is not bleeding anymore. She states she was incontinent of stool 3 x last night soiling her PJs. She is avoiding all fiber and has only be eating canned vegetables and fruits. Please advise. Thanks.

## 2012-04-06 NOTE — Telephone Encounter (Signed)
This is not unexpected as she recovers from likely ischemic injury to colon.  Would rec low-residue diet for now. If pain is gone, she can try anti-diarrhea medication, imodium 2 mg after each loose stool up to 4 tabs daily. She should have office followup soon.

## 2012-04-12 ENCOUNTER — Telehealth: Payer: Self-pay | Admitting: *Deleted

## 2012-04-12 ENCOUNTER — Ambulatory Visit (INDEPENDENT_AMBULATORY_CARE_PROVIDER_SITE_OTHER): Payer: Medicaid Other | Admitting: Nurse Practitioner

## 2012-04-12 ENCOUNTER — Other Ambulatory Visit (INDEPENDENT_AMBULATORY_CARE_PROVIDER_SITE_OTHER): Payer: Medicaid Other

## 2012-04-12 ENCOUNTER — Other Ambulatory Visit: Payer: Self-pay | Admitting: Internal Medicine

## 2012-04-12 ENCOUNTER — Encounter: Payer: Self-pay | Admitting: Nurse Practitioner

## 2012-04-12 VITALS — BP 112/76 | HR 80 | Temp 98.3°F | Ht 64.0 in | Wt 182.2 lb

## 2012-04-12 DIAGNOSIS — K5289 Other specified noninfective gastroenteritis and colitis: Secondary | ICD-10-CM

## 2012-04-12 DIAGNOSIS — K529 Noninfective gastroenteritis and colitis, unspecified: Secondary | ICD-10-CM

## 2012-04-12 LAB — CBC WITH DIFFERENTIAL/PLATELET
Basophils Absolute: 0.1 10*3/uL (ref 0.0–0.1)
Basophils Relative: 0.8 % (ref 0.0–3.0)
Eosinophils Absolute: 0.3 10*3/uL (ref 0.0–0.7)
Lymphocytes Relative: 45.1 % (ref 12.0–46.0)
MCHC: 34.6 g/dL (ref 30.0–36.0)
MCV: 97 fl (ref 78.0–100.0)
Monocytes Absolute: 0.7 10*3/uL (ref 0.1–1.0)
Neutro Abs: 4 10*3/uL (ref 1.4–7.7)
Neutrophils Relative %: 43.5 % (ref 43.0–77.0)
RBC: 4.24 Mil/uL (ref 3.87–5.11)
RDW: 13.5 % (ref 11.5–14.6)

## 2012-04-12 LAB — BASIC METABOLIC PANEL
BUN: 9 mg/dL (ref 6–23)
Creatinine, Ser: 1.1 mg/dL (ref 0.4–1.2)
GFR: 54.11 mL/min — ABNORMAL LOW (ref >60.00)

## 2012-04-12 MED ORDER — MOVIPREP 100 G PO SOLR: 1.0000 | Freq: Once | ORAL | Status: AC

## 2012-04-12 MED ORDER — OXYCODONE HCL 5 MG PO TABS: ORAL_TABLET | ORAL | Status: DC

## 2012-04-12 NOTE — Telephone Encounter (Signed)
04/12/2012    RE: Brooke Garcia DOB: August 31, 1956 MRN: [id number]   Dear Dr Yetta Barre,    We have scheduled the above patient for an endoscopic procedure. Our records show that she is on anticoagulation therapy.   Please advise as to how long the patient may come off her therapy of Coumadin prior to the procedure, which is scheduled for 04-25-2012 with Dr Erick Blinks.  Please fax back/ or route the completed form to [facility] CMA at [phone].   Sincerely,    Willette Cluster ACNP

## 2012-04-12 NOTE — Patient Instructions (Addendum)
Please go to the basement level to have your labs drawn.   You have been given a separate informational sheet regarding your tobacco use, the importance of quitting and local resources to help you quit. We have given you a $20 Voucher for the Moviprep. Keep your receipt from the pharmacy and mail it in with the voucher to get $20. We have scheduled the colonoscopy with Dr. Erick Blinks for Tues 04-25-2012. Directions provided. We will contact you about the coumadin medication once we hear from Dr. Yetta Barre.

## 2012-04-12 NOTE — Progress Notes (Signed)
Brooke Garcia [id number] 08/24/1956   HISTORY OR PRESENT ILLNESS :   Patient is a 55-year-old female who we recently saw in the hospital for colitis. Patient initially presented to [facility] with diarrhea and acute renal failure . She was  transferred to . CT scan compatible with colitis involving the transverse and proximal descending colon. We were asked to evaluate, felt colitis was ischemic in nature and advised an outpatient colonoscopy in 4-6 weeks. Patient was discharged on 04/01/12. Since then she has continued to have multiple daily episodes of diarrhea (6-7 times a day). No further bleeding. She has had low grade fevers and continues to have significant abdominal pain. She is out of pain meds as of today. Eating makes pain and diarrhea worse but she is following a low residue diet. Patient was fine prior to this acute illness, no chronic GI problems.    Current Medications, Allergies, Past Medical History, Past Surgical History, Family History and Social History were reviewed in [facility] electronic medical record.   PHYSICAL EXAMINATION : General:  Well developed  female in no acute distress Head: Normocephalic and atraumatic Eyes:  sclerae anicteric,conjunctive pink. Ears: Normal auditory acuity Neck: Supple, no masses.  Lungs: Clear throughout to auscultation Heart: Regular rate and rhythm; no murmurs heard Abdomen: Soft, nondistended, mild to moderate diffuse tenderness . No masses or hepatomegaly noted. Normal bowel sounds Rectal: not done Musculoskeletal: Symmetrical with no gross deformities  Skin: No lesions on visible extremities Extremities: No edema or deformities noted Neurological: Oriented x 4, grossly nonfocal Cervical Nodes:  No significant cervical adenopathy Psychological:  Alert and cooperative. Normal mood and affect  ASSESSMENT AND PLAN :   1. Colitis by CT scan 03/28/12. Bleeding has ceased but patient has persistent  abdominal pain, diarrhea and low-grade fevers. Rule out ischemic, rule out infectious (though stool studies in the hospital were negative with the exception of lactoferrin). Rule out inflammatory bowel disease. Patient will be scheduled for a colonoscopy for further evaluation.The risks, benefits, and alternatives to colonoscopy with possible biopsy and possible polypectomy were discussed with the patient and they consent to proceed. Will obtain basic labs , contact Dr. Jones regarding Coumadin and refill her pain medication. If patient develops high fevers or worsening pain between now and time of  colonoscopy she will go to the emergency department.  2. hypercoagulable state / protein S. deficiency, on chronic Coumadin.  3. asymptomatic cholelithiasis.   

## 2012-04-13 ENCOUNTER — Encounter (HOSPITAL_BASED_OUTPATIENT_CLINIC_OR_DEPARTMENT_OTHER): Payer: Medicaid Other | Attending: Internal Medicine

## 2012-04-13 ENCOUNTER — Other Ambulatory Visit: Payer: Self-pay | Admitting: *Deleted

## 2012-04-13 ENCOUNTER — Other Ambulatory Visit: Payer: Self-pay | Admitting: Internal Medicine

## 2012-04-13 DIAGNOSIS — I872 Venous insufficiency (chronic) (peripheral): Secondary | ICD-10-CM | POA: Insufficient documentation

## 2012-04-13 MED ORDER — WARFARIN SODIUM 7.5 MG PO TABS: 7.5000 mg | ORAL_TABLET | Freq: Every day | ORAL | Status: DC

## 2012-04-13 NOTE — Progress Notes (Signed)
Reviewed and agree with management. Levester Waldridge D. Braulio Kiedrowski, M.D., FACG  

## 2012-04-16 NOTE — Telephone Encounter (Signed)
I believe she has changed to a medicaid plan that we do not see in primary care, also she is overdue for f/up with me so at this time I do not have any recommendations for you

## 2012-04-17 ENCOUNTER — Other Ambulatory Visit: Payer: Self-pay | Admitting: *Deleted

## 2012-04-17 ENCOUNTER — Telehealth: Payer: Self-pay | Admitting: *Deleted

## 2012-04-17 NOTE — Telephone Encounter (Signed)
I called the patient and aplogized up front.  I gave her the Colonoscopy w/ propofol instructions for 04-25-2012 at 11 AM, [facility] Suite with Dr. Erick Blinks.  I had done everything except call the hospital and actually schedule it.  I don't  Know how I would have come up with the time of 11 Am though but Noreene Larsson told me this pt had no time in that day or any other day for a colonoscopy.  Noreene Larsson gave me the colonoscopy appt time for 11:30AM on 04-25-2012 at [facility] suite. Booking # [id number].  I called the pt and she was thrilled the the time was only changed by 30 min.  I aplogized for the inconvenience.  The patient was fine with the change.

## 2012-04-17 NOTE — Telephone Encounter (Signed)
I called the patient and asked her about her coumadin.  I advised I got an answer back from Dr. Yetta Barre that she changed her medicaid plan and he don't see that insurance in primary care.  Also she is over due for a follow-up  With me so at this time, he doesn't have any recommendations for me.  I asked my director about it and she called the director in Primary Care.   They discussed this and that women will talk to Dr. Yetta Barre.  The patient there has been confusion about her Medicaid and she does not have [name].  I did find the Midcal Exemtion Request form from [name] under Media that Dr Yetta Barre signed on 03-09-2012. I told the patient I will call her back once we have an answer about her coumadin.  The patient said she has never been to [facility]. I had asked her.

## 2012-04-18 ENCOUNTER — Encounter (HOSPITAL_BASED_OUTPATIENT_CLINIC_OR_DEPARTMENT_OTHER): Payer: Medicaid Other

## 2012-04-18 ENCOUNTER — Telehealth: Payer: Self-pay | Admitting: Internal Medicine

## 2012-04-18 ENCOUNTER — Telehealth: Payer: Self-pay | Admitting: *Deleted

## 2012-04-18 NOTE — Telephone Encounter (Signed)
I called the pt and had to leave a message to call Primary Care today if possible to make appt to see Dr. Yetta Barre tomorrow or at the latest Thursday.  I left a message we have not cancelled the hospital case for 8-13 yet .  My director Erskine Squibb Curtain advised me she spoke to the office director on in Primary Care and they determined the patient needs to see Dr Yetta Barre for an appointment before the procedure.

## 2012-04-18 NOTE — Telephone Encounter (Signed)
Caller: Brooke Garcia/Patient; PCP: Sanda Linger; CB#: [phone];  Call regarding Medication Issue; was hospitalized over a wk ago; needs to have a colonoscopy; has appt scheduled 04/25/12 at 1130 at [place] by Dr.Pearl; has had a med exempt paper filled out and Dr.Jones does rx for Coumadin; [name] needs an order to come off her Coumadin by 8/8 which will be 5 days prior to her procedure; please advise pt regarding this matter

## 2012-04-18 NOTE — Telephone Encounter (Signed)
I need to see her

## 2012-04-19 ENCOUNTER — Telehealth: Payer: Self-pay | Admitting: *Deleted

## 2012-04-19 NOTE — Telephone Encounter (Signed)
This patient has [employer].  There is another provider on her card.  Do you want [place] to schedule here?  We can't bill her insurance.  She would be self pay.  The PCP on her card would need to provide the referrals.

## 2012-04-19 NOTE — Telephone Encounter (Signed)
Spoke with patient and informed her that Dr. Yetta Barre cannot give her clearance without a visit and that due to her current insurance plan she will need to establish care with her CA provider. Her [place] provider will need to give a referral for her colonoscopy to be covered, Dr. Yetta Barre cannot give that referral. Patient was upset but stated understanding and hung up. I have given this information to [facility] and Aurea Graff in GI office.

## 2012-04-19 NOTE — Telephone Encounter (Signed)
The patient told me they called her from Brooke. [name] office and told her they cannot take her insurance, [name].  She would have to pay cash for an office visit and labs.  She said she doesn't have the cash.  She started to cry.  She told me she is going to stop the coumadin and then Brooke Garcia can do the colon on Tues 8-13.  I told her not to do that.  I then talked to Brooke Garcia and she told me to call Brooke Garcia back and tell her absolutely to not stop the coumadin.  Brooke Garcia will talk to Brooke Garcia on Monday.  She said he may do the colonoscopy on coumadin but she cannot guarantee that.  I called Brooke Garcia to tell her.  She then said she would take 1/2 a pill daily and I told her not to change anything, to take the pill as directed daily.  I told her if she doesn't take the coumadin or changes her dosage Brooke Garcia may get aggrevated and not do the procedure because she took matters into her own hands.

## 2012-04-20 NOTE — Telephone Encounter (Signed)
I called the patient to inform her of this and she is going to try to get a ride there today after her sister gets off work today.

## 2012-04-20 NOTE — Telephone Encounter (Signed)
Willette Cluster ACNP called [facility] to ask about this patient.  Since she is assigned to them as a patient according to [name]/[place], she needs to go to them and establish care.  Once she does that and she gets a physical, they can refer her to [place] for the Colonoscopy.  Gunnar Fusi left a message for the referral coordinator.

## 2012-04-21 NOTE — Telephone Encounter (Signed)
The patient called to let me know she went to [facility] this AM.  She saw Dr. Lucillie Garfinkel. He is only there every 2 weeks. She had labs. She told the MD she needed him to check her PT/INR and is needing this referral  For a colonoscopy with Dr. Rhea Belton.  Need to know how many days she can be off of the coumadin.

## 2012-04-24 ENCOUNTER — Other Ambulatory Visit: Payer: Self-pay | Admitting: Gastroenterology

## 2012-04-24 ENCOUNTER — Telehealth: Payer: Self-pay | Admitting: Gastroenterology

## 2012-04-24 DIAGNOSIS — K529 Noninfective gastroenteritis and colitis, unspecified: Secondary | ICD-10-CM

## 2012-04-24 DIAGNOSIS — R109 Unspecified abdominal pain: Secondary | ICD-10-CM

## 2012-04-24 NOTE — Telephone Encounter (Signed)
Per last note, I called [place] urgent care to get documentation of pt being seen and having labs done, and to get referral.  Had to lvm for medical records.  Pt called in this morning and spoke to Jun McMurrey saying she wants to schedule a colonoscopy with Dr. Rhea Belton on 04-28-12 @ .

## 2012-04-25 ENCOUNTER — Encounter (HOSPITAL_COMMUNITY): Admission: RE | Payer: Self-pay | Source: Ambulatory Visit

## 2012-04-25 ENCOUNTER — Ambulatory Visit (HOSPITAL_COMMUNITY): Admission: RE | Admit: 2012-04-25 | Payer: Medicaid Other | Source: Ambulatory Visit | Admitting: Internal Medicine

## 2012-04-25 SURGERY — COLONOSCOPY WITH PROPOFOL
Anesthesia: Monitor Anesthesia Care

## 2012-04-27 ENCOUNTER — Other Ambulatory Visit: Payer: Self-pay | Admitting: *Deleted

## 2012-04-27 ENCOUNTER — Telehealth: Payer: Self-pay | Admitting: Gastroenterology

## 2012-04-27 ENCOUNTER — Encounter (HOSPITAL_COMMUNITY): Payer: Self-pay | Admitting: Internal Medicine

## 2012-04-27 ENCOUNTER — Ambulatory Visit (HOSPITAL_COMMUNITY): Admission: RE | Admit: 2012-04-27 | Payer: Medicaid Other | Source: Ambulatory Visit | Admitting: Internal Medicine

## 2012-04-27 ENCOUNTER — Ambulatory Visit (HOSPITAL_COMMUNITY): Payer: Medicaid Other | Admitting: Registered Nurse

## 2012-04-27 ENCOUNTER — Encounter (HOSPITAL_COMMUNITY)
Admission: RE | Disposition: A | Discharge status: Home / Self Care | Payer: Self-pay | Source: Ambulatory Visit | Attending: Internal Medicine

## 2012-04-27 ENCOUNTER — Encounter (HOSPITAL_COMMUNITY): Payer: Self-pay | Admitting: Registered Nurse

## 2012-04-27 ENCOUNTER — Ambulatory Visit (HOSPITAL_COMMUNITY)
Admission: RE | Admit: 2012-04-27 | Discharge: 2012-04-27 | Disposition: A | Discharge status: Home / Self Care | Payer: Medicaid Other | Source: Ambulatory Visit | Attending: Internal Medicine | Admitting: Internal Medicine

## 2012-04-27 DIAGNOSIS — K648 Other hemorrhoids: Secondary | ICD-10-CM | POA: Insufficient documentation

## 2012-04-27 DIAGNOSIS — R109 Unspecified abdominal pain: Secondary | ICD-10-CM

## 2012-04-27 DIAGNOSIS — D378 Neoplasm of uncertain behavior of other specified digestive organs: Secondary | ICD-10-CM | POA: Insufficient documentation

## 2012-04-27 DIAGNOSIS — K8020 Calculus of gallbladder without cholecystitis without obstruction: Secondary | ICD-10-CM | POA: Insufficient documentation

## 2012-04-27 DIAGNOSIS — Z7901 Long term (current) use of anticoagulants: Secondary | ICD-10-CM | POA: Insufficient documentation

## 2012-04-27 DIAGNOSIS — D6859 Other primary thrombophilia: Secondary | ICD-10-CM | POA: Insufficient documentation

## 2012-04-27 DIAGNOSIS — J4489 Other specified chronic obstructive pulmonary disease: Secondary | ICD-10-CM | POA: Insufficient documentation

## 2012-04-27 DIAGNOSIS — K529 Noninfective gastroenteritis and colitis, unspecified: Secondary | ICD-10-CM

## 2012-04-27 DIAGNOSIS — D371 Neoplasm of uncertain behavior of stomach: Secondary | ICD-10-CM | POA: Insufficient documentation

## 2012-04-27 DIAGNOSIS — I10 Essential (primary) hypertension: Secondary | ICD-10-CM | POA: Insufficient documentation

## 2012-04-27 DIAGNOSIS — K5289 Other specified noninfective gastroenteritis and colitis: Secondary | ICD-10-CM

## 2012-04-27 DIAGNOSIS — K551 Chronic vascular disorders of intestine: Secondary | ICD-10-CM | POA: Insufficient documentation

## 2012-04-27 DIAGNOSIS — K633 Ulcer of intestine: Secondary | ICD-10-CM | POA: Insufficient documentation

## 2012-04-27 DIAGNOSIS — D126 Benign neoplasm of colon, unspecified: Secondary | ICD-10-CM | POA: Insufficient documentation

## 2012-04-27 DIAGNOSIS — K635 Polyp of colon: Secondary | ICD-10-CM

## 2012-04-27 DIAGNOSIS — J449 Chronic obstructive pulmonary disease, unspecified: Secondary | ICD-10-CM | POA: Insufficient documentation

## 2012-04-27 DIAGNOSIS — F17200 Nicotine dependence, unspecified, uncomplicated: Secondary | ICD-10-CM | POA: Insufficient documentation

## 2012-04-27 HISTORY — PX: COLONOSCOPY: SHX5424

## 2012-04-27 HISTORY — DX: Disease of blood and blood-forming organs, unspecified: D75.9

## 2012-04-27 LAB — PROTIME-INR
INR: 1.31 (ref 0.00–1.49)
Prothrombin Time: 16.5 seconds — ABNORMAL HIGH (ref 11.6–15.2)

## 2012-04-27 SURGERY — COLONOSCOPY
Anesthesia: Moderate Sedation

## 2012-04-27 SURGERY — COLONOSCOPY
Anesthesia: Monitor Anesthesia Care

## 2012-04-27 MED ORDER — PROPOFOL 10 MG/ML IV EMUL
INTRAVENOUS | Status: DC | PRN
  Administered 2012-04-27: 50 mg via INTRAVENOUS

## 2012-04-27 MED ORDER — SODIUM CHLORIDE 0.9 % IV SOLN
INTRAVENOUS | Status: DC | PRN
  Administered 2012-04-27: 12:00:00 via INTRAVENOUS

## 2012-04-27 MED ORDER — MIDAZOLAM HCL 5 MG/5ML IJ SOLN
INTRAMUSCULAR | Status: DC | PRN
  Administered 2012-04-27: 2 mg via INTRAVENOUS
  Administered 2012-04-27: 3 mg via INTRAVENOUS
  Administered 2012-04-27: 2 mg via INTRAVENOUS

## 2012-04-27 MED ORDER — MIDAZOLAM HCL 10 MG/2ML IJ SOLN
INTRAMUSCULAR | Status: AC
  Filled 2012-04-27: qty 2

## 2012-04-27 MED ORDER — FENTANYL CITRATE 0.05 MG/ML IJ SOLN
INTRAMUSCULAR | Status: AC
  Filled 2012-04-27: qty 4

## 2012-04-27 MED ORDER — HYDROMORPHONE HCL 2 MG PO TABS: ORAL_TABLET | ORAL | Status: DC

## 2012-04-27 MED ORDER — FENTANYL CITRATE 0.05 MG/ML IJ SOLN
INTRAMUSCULAR | Status: DC | PRN
  Administered 2012-04-27: 100 ug via INTRAVENOUS

## 2012-04-27 MED ORDER — DIPHENHYDRAMINE HCL 50 MG/ML IJ SOLN
INTRAMUSCULAR | Status: DC | PRN
  Administered 2012-04-27: 25 mg via INTRAVENOUS

## 2012-04-27 MED ORDER — DIPHENHYDRAMINE HCL 50 MG/ML IJ SOLN
INTRAMUSCULAR | Status: AC
  Filled 2012-04-27: qty 1

## 2012-04-27 MED ORDER — MIDAZOLAM HCL 5 MG/5ML IJ SOLN
INTRAMUSCULAR | Status: DC | PRN
  Administered 2012-04-27: 2 mg via INTRAVENOUS

## 2012-04-27 MED ORDER — LIDOCAINE HCL (CARDIAC) 20 MG/ML IV SOLN
INTRAVENOUS | Status: DC | PRN
  Administered 2012-04-27: 50 mg via INTRAVENOUS

## 2012-04-27 MED ORDER — PROPOFOL 10 MG/ML IV EMUL
INTRAVENOUS | Status: DC | PRN
  Administered 2012-04-27: 150 ug/kg/min via INTRAVENOUS

## 2012-04-27 MED ORDER — FENTANYL CITRATE 0.05 MG/ML IJ SOLN
INTRAMUSCULAR | Status: DC | PRN
  Administered 2012-04-27: 25 ug via INTRAVENOUS
  Administered 2012-04-27: 50 ug via INTRAVENOUS
  Administered 2012-04-27: 25 ug via INTRAVENOUS

## 2012-04-27 MED ORDER — MIDAZOLAM HCL 10 MG/2ML IJ SOLN
INTRAMUSCULAR | Status: AC
  Filled 2012-04-27: qty 4

## 2012-04-27 NOTE — Op Note (Signed)
[facility] [address] [place], [place]  [address]  COLONOSCOPY PROCEDURE REPORT  PATIENT:  Brooke Garcia, Brooke Garcia  MR#:  [id number] BIRTHDATE:  Sep 20, 1955, 55 yrs. old  GENDER:  female ENDOSCOPIST:  Carie Caddy. Ramsay Bognar, MD  PROCEDURE DATE:  04/27/2012 PROCEDURE:  Colonoscopy with biopsy and snare polypectomy ASA CLASS:  Class III INDICATIONS:  Abnormal CT of abdomen, hematochezia, Abdominal pain  MEDICATIONS:   MAC sedation, administered by CRNA, See Anesthesia Report.  DESCRIPTION OF PROCEDURE:   After the risks benefits and alternatives of the procedure were thoroughly explained, informed consent was obtained.  Digital rectal exam was performed and revealed no rectal masses.   The Pentax Colonoscope [id number] endoscope was introduced through the anus and advanced to the cecum, which was identified by both the appendix and ileocecal valve, without limitations.  The quality of the prep was good, using MoviPrep.  The instrument was then slowly withdrawn as the colon was fully examined. <<PROCEDUREIMAGES>>  FINDINGS:  A 3 mm sessile polyp was found in the ascending colon. Polyp was snared without cautery. Retrieval was successful.  An 8 mm sessile polyp was found in the ascending colon. Polyp was snared, then cauterized with monopolar cautery. Retrieval was successful.  Areas of patchy ulceration with mild surrounding colitis was found in the distal ascending colon, hepatic flexure and transverse colon. There is an area of more intense ulceration and erythema at the splenic flexure and in the proximal descending colon. One segment of the proximal descending colon has circumferential ulceration and inflammation. This is most consistent with ischemic injury.  Multiple biopsies were obtained and sent to pathology.   Retroflexed views in the rectum revealed internal hemorrhoids.    The scope was then withdrawn from the cecum and the procedure completed.  COMPLICATIONS:   None  ENDOSCOPIC IMPRESSION: 1) Sessile polyp in the ascending colon. Removed and sent to pathology 2) Sessile polyp in the ascending colon. Removed and sent to pathology 3) Colitis as described above, most likely ischemic. Multiple biopsies obtained and sent to pathology 4) Internal hemorrhoids  RECOMMENDATIONS: 1) Await pathology results 2) Avoid NSAIDS 3) Timing of repeat colonoscopy will be determined by pathology findings. 4) You will receive a letter within 1-2 weeks with the results of your biopsy as well as final recommendations. Please call my office if you have not received a letter after 3 weeks. 5) Office follow-up in 2-3 weeks 6) Resume warfarin tonight.  Carie Caddy. Kennith Morss, MD  CC:  The Patient  n. eSIGNED:   Carie Caddy. Tanveer Dobberstein at 04/27/2012 01:18 PM  Tyson Dense, [id number]

## 2012-04-27 NOTE — Telephone Encounter (Signed)
Spoke to pt. Got word from [facility] [place] that Pt is to stop blood thinner day prior to procedure.

## 2012-04-27 NOTE — Interval H&P Note (Signed)
History and Physical Interval Note: She was hospitalized in July with colitis, presumed ischemic in the left colon. She was followed up in our office in last seen on 04/12/2012. He presents today for colonoscopy to evaluate this incidence of colitis and for colon cancer screening. She does have a history of protein S deficiency and is been maintained on warfarin.he reports stopping her warfarin 5 days ago. A stat INR will be checked. Assuming this is less than 1.5 we'll proceed to colonoscopy.  04/27/2012 10:52 AM  Brooke Garcia  has presented today for surgery, with the diagnosis of evaluation of colitis  The various methods of treatment have been discussed with the patient and family. After consideration of risks, benefits and other options for treatment, the patient has consented to  Procedure(s) (LRB): COLONOSCOPY (N/A) as a surgical intervention .  The patient's history has been reviewed, patient examined, no change in status, stable for surgery.  I have reviewed the patient's chart and labs.  Questions were answered to the patient's satisfaction.     PYRTLE, JAY M

## 2012-04-27 NOTE — Anesthesia Postprocedure Evaluation (Signed)
  Anesthesia Post-op Note  Patient: Brooke Garcia  Procedure(s) Performed: Procedure(s) (LRB): COLONOSCOPY (N/A)  Patient Location: PACU  Anesthesia Type: MAC  Level of Consciousness: awake and alert   Airway and Oxygen Therapy: Patient Spontanous Breathing  Post-op Pain: mild  Post-op Assessment: Post-op Vital signs reviewed, Patient's Cardiovascular Status Stable, Respiratory Function Stable, Patent Airway and No signs of Nausea or vomiting  Post-op Vital Signs: stable  Complications: No apparent anesthesia complications

## 2012-04-27 NOTE — Progress Notes (Signed)
D/c instructions given to brother car side.

## 2012-04-27 NOTE — H&P (View-Only) (Signed)
Brooke Garcia [id number] 11/25/1955   HISTORY OR PRESENT ILLNESS :   Patient is a 56 year old female who we recently saw in the hospital for colitis. Patient initially presented to [facility] with diarrhea and acute renal failure . She was  transferred to [facility]. CT scan compatible with colitis involving the transverse and proximal descending colon. We were asked to evaluate, felt colitis was ischemic in nature and advised an outpatient colonoscopy in 4-6 weeks. Patient was discharged on 04/01/12. Since then she has continued to have multiple daily episodes of diarrhea (6-7 times a day). No further bleeding. She has had low grade fevers and continues to have significant abdominal pain. She is out of pain meds as of today. Eating makes pain and diarrhea worse but she is following a low residue diet. Patient was fine prior to this acute illness, no chronic GI problems.    Current Medications, Allergies, Past Medical History, Past Surgical History, Family History and Social History were reviewed in [employer] record.   PHYSICAL EXAMINATION : General:  Well developed  female in no acute distress Head: Normocephalic and atraumatic Eyes:  sclerae anicteric,conjunctive pink. Ears: Normal auditory acuity Neck: Supple, no masses.  Lungs: Clear throughout to auscultation Heart: Regular rate and rhythm; no murmurs heard Abdomen: Soft, nondistended, mild to moderate diffuse tenderness . No masses or hepatomegaly noted. Normal bowel sounds Rectal: not done Musculoskeletal: Symmetrical with no gross deformities  Skin: No lesions on visible extremities Extremities: No edema or deformities noted Neurological: Oriented x 4, grossly nonfocal Cervical Nodes:  No significant cervical adenopathy Psychological:  Alert and cooperative. Normal mood and affect  ASSESSMENT AND PLAN :   1. Colitis by CT scan 03/28/12. Bleeding has ceased but patient has persistent  abdominal pain, diarrhea and low-grade fevers. Rule out ischemic, rule out infectious (though stool studies in the hospital were negative with the exception of lactoferrin). Rule out inflammatory bowel disease. Patient will be scheduled for a colonoscopy for further evaluation.The risks, benefits, and alternatives to colonoscopy with possible biopsy and possible polypectomy were discussed with the patient and they consent to proceed. Will obtain basic labs , contact Dr. Yetta Barre regarding Coumadin and refill her pain medication. If patient develops high fevers or worsening pain between now and time of  colonoscopy she will go to the emergency department.  2. hypercoagulable state / protein S. deficiency, on chronic Coumadin.  3. asymptomatic cholelithiasis.

## 2012-04-27 NOTE — Transfer of Care (Signed)
Immediate Anesthesia Transfer of Care Note  Patient: Brooke Garcia  Procedure(s) Performed: Procedure(s) (LRB): COLONOSCOPY (N/A)  Patient Location: PACU  Anesthesia Type: MAC  Level of Consciousness: awake, alert , oriented, patient cooperative and responds to stimulation  Airway & Oxygen Therapy: Patient Spontanous Breathing and Patient connected to face mask oxygen  Post-op Assessment: Report given to PACU RN, Post -op Vital signs reviewed and stable and Patient moving all extremities X 4  Post vital signs: stable  Complications: No apparent anesthesia complications

## 2012-04-27 NOTE — Anesthesia Preprocedure Evaluation (Signed)
Anesthesia Evaluation  Patient identified by MRN, date of birth, ID band Patient awake    Reviewed: Allergy & Precautions, H&P , NPO status , Patient's Chart, lab work & pertinent test results  History of Anesthesia Complications (+) DIFFICULT AIRWAY  Airway Mallampati: II TM Distance: >3 FB Neck ROM: full    Dental  (+) Edentulous Upper and Edentulous Lower   Pulmonary neg pulmonary ROS, COPDCurrent Smoker,  Hx. PE remote. breath sounds clear to auscultation  Pulmonary exam normal       Cardiovascular Exercise Tolerance: Good hypertension, negative cardio ROS  Rhythm:regular Rate:Normal     Neuro/Psych negative neurological ROS  negative psych ROS   GI/Hepatic negative GI ROS, Neg liver ROS,   Endo/Other  negative endocrine ROS  Renal/GU negative Renal ROS  negative genitourinary   Musculoskeletal   Abdominal   Peds  Hematology negative hematology ROS (+) Hypercoagulable.  On coumadin.   Anesthesia Other Findings   Reproductive/Obstetrics negative OB ROS                           Anesthesia Physical Anesthesia Plan  ASA: III  Anesthesia Plan: MAC   Post-op Pain Management:    Induction:   Airway Management Planned: Simple Face Mask  Additional Equipment:   Intra-op Plan:   Post-operative Plan:   Informed Consent: I have reviewed the patients History and Physical, chart, labs and discussed the procedure including the risks, benefits and alternatives for the proposed anesthesia with the patient or authorized representative who has indicated his/her understanding and acceptance.   Dental Advisory Given  Plan Discussed with: CRNA and Surgeon  Anesthesia Plan Comments:         Anesthesia Quick Evaluation

## 2012-04-28 ENCOUNTER — Encounter (HOSPITAL_COMMUNITY): Payer: Self-pay | Admitting: Internal Medicine

## 2012-04-30 ENCOUNTER — Other Ambulatory Visit: Payer: Self-pay | Admitting: Internal Medicine

## 2012-05-01 ENCOUNTER — Telehealth: Payer: Self-pay | Admitting: Internal Medicine

## 2012-05-01 DIAGNOSIS — K559 Vascular disorder of intestine, unspecified: Secondary | ICD-10-CM

## 2012-05-01 NOTE — Telephone Encounter (Signed)
The biopsies from her colonic ulcers showed ischemic injury, consistent with low blood flow to the colon She did also have one adenomatous polyp, which did not have cancer in it At this point, I think she needs an evaluation with a CT angiogram evaluating her blood flow to her bowel. She should remain on very low residue diet, bland foods, with attention to stay hydrated I will see her in clinic on Wednesday

## 2012-05-01 NOTE — Telephone Encounter (Signed)
Do I order the CT or will you discuss this at visit on Wednesday? Thanks.

## 2012-05-01 NOTE — Telephone Encounter (Signed)
Please order

## 2012-05-01 NOTE — Telephone Encounter (Signed)
Pt reports she still can't eat anything; she gets terrible stomach pains even with pudding and jello. Her COLON was on 04/27/12 and she had clear liquids the rest of the day. When she tries to advance her diet, the pain returns. Saturday, she ate more solid food and then had diarrhea and terrible pain. She had to take 2 Dilaudid and they didn't stop the pain. She states the pain isn't 24/7, but when she has it, it's really bad and she doubles over in pain and can't walk. She also reports problems urinating. Pt has a f/u appt on 05/03/12 at 0945am. Please advise if possible. Thanks.

## 2012-05-02 ENCOUNTER — Encounter: Payer: Self-pay | Admitting: Internal Medicine

## 2012-05-02 ENCOUNTER — Telehealth: Payer: Self-pay | Admitting: Internal Medicine

## 2012-05-02 NOTE — Telephone Encounter (Signed)
Informed pt of suggestions for f/u tomorrow or wait until after CT Angio on 05/04/12. Pt given appt time and NPO status. Her only concern is her pain meds. Per note below, her concern is with her pain meds. She reports nothing is helping her pain; she has to take 2 Dilaudid at a time and that's not working. She would like to go back to the Oxycodone, but states she doesn't remember if they worked better. Since the system did not list OXY 10 mg. Just 5 MG, maybe we should write for 15 mg which she has been on before. She states she has a few Dilaudid left that will last maybe until Thursday or she can have her brother come by today. She was given 2mg  Dilaudid #30, one Q6hr prn on 04/27/12, but states she's had to take 2 at a time Please advise. Thanks; referral to pain clinic no good because they don't take medicaid!

## 2012-05-02 NOTE — Telephone Encounter (Signed)
lmom for pt to call back. Per Dr Rhea Belton, it's up to the pt whether she wants to be seen before or after the CT. Tomorrow he will go over her bx results, but he won't know until after the CT Angiogram whether anything else can be done or if her bowel needs more time to heal.

## 2012-05-02 NOTE — Telephone Encounter (Signed)
Pt's CT Angiogram is scheduled for Thursday, 05/04/12 and her f/u is tomorrow; still want to see her tomorrow? As for your schedule, you have no f/u until 05/23/12. Please advise. Thanks.

## 2012-05-02 NOTE — Telephone Encounter (Signed)
I do not doubt that the ulceration seen in the colon as a result of ischemia is painful.  She will likely require pain medications while her colon heals, but she needs to take narcotic pain meds as infreq as possible.  Narcotics slow down GI transit and in some cases can make issues worse.  I am okay with oxycodone 15 mg q6h prn abd pain (would dispense without APAP to ensure she does not get too much APAP in 24 hrs).  She can followup after CT-A which is scheduled, and I realize this will be in early Sept.  She can always be seen more urgently if necessary. Thanks

## 2012-05-03 ENCOUNTER — Ambulatory Visit: Payer: Self-pay | Admitting: Internal Medicine

## 2012-05-03 MED ORDER — OXYCODONE HCL 15 MG PO TABS: ORAL_TABLET | ORAL | Status: DC

## 2012-05-03 NOTE — Telephone Encounter (Signed)
Duplicate encounter

## 2012-05-03 NOTE — Telephone Encounter (Signed)
Informed pt of Dr Lauro Franklin findings and recommendations. Left her script at the front desk and she will have the CT Angiogram tomorrow; pt stated understanding.

## 2012-05-04 ENCOUNTER — Telehealth: Payer: Self-pay | Admitting: Internal Medicine

## 2012-05-04 ENCOUNTER — Ambulatory Visit (INDEPENDENT_AMBULATORY_CARE_PROVIDER_SITE_OTHER)
Admission: RE | Admit: 2012-05-04 | Discharge: 2012-05-04 | Disposition: A | Discharge status: Home / Self Care | Payer: Medicaid Other | Source: Ambulatory Visit | Attending: Internal Medicine | Admitting: Internal Medicine

## 2012-05-04 DIAGNOSIS — K559 Vascular disorder of intestine, unspecified: Secondary | ICD-10-CM

## 2012-05-04 MED ORDER — IOHEXOL 350 MG/ML SOLN
100.0000 mL | Freq: Once | INTRAVENOUS | Status: AC | PRN
  Administered 2012-05-04: 100 mL via INTRAVENOUS

## 2012-05-04 NOTE — Telephone Encounter (Signed)
Informed pt to call Rose at [phone] to r/s her CT; pt stated understanding and apologized.

## 2012-05-05 ENCOUNTER — Telehealth: Payer: Self-pay | Admitting: Internal Medicine

## 2012-05-05 NOTE — Telephone Encounter (Signed)
Informed pt of her CT results and she stated understanding and will f/u on 05/23/12. She reports she is having bad heartburn and asked what she could take and I suggested OTC Prilosec, if no better in a few days she will call us. We also discussed her GB results and again, I asked that she discuss this with Dr Rhea Belton; pt stated understanding.

## 2012-05-08 ENCOUNTER — Encounter: Payer: Self-pay | Admitting: Internal Medicine

## 2012-05-08 MED ORDER — PANTOPRAZOLE SODIUM 40 MG PO TBEC: 40.0000 mg | DELAYED_RELEASE_TABLET | Freq: Every day | ORAL | Status: DC

## 2012-05-08 NOTE — Telephone Encounter (Signed)
Agree with prilosec OTC.  If not better change to pantoprazole 40 mg daily.  30 min before breakfast

## 2012-05-08 NOTE — Addendum Note (Signed)
Addended by: Florene Glen on: 05/08/2012 01:38 PM   Modules accepted: Orders

## 2012-05-08 NOTE — Telephone Encounter (Signed)
Informed pt of PPI's choices per Dr Rhea Belton. Pt requested pantoprazole to be ordered at [facility] on [address].

## 2012-05-16 ENCOUNTER — Telehealth: Payer: Self-pay | Admitting: Internal Medicine

## 2012-05-17 ENCOUNTER — Other Ambulatory Visit: Payer: Self-pay | Admitting: Gastroenterology

## 2012-05-17 MED ORDER — OXYCODONE HCL 15 MG PO TABS: ORAL_TABLET | ORAL | Status: DC

## 2012-05-22 ENCOUNTER — Encounter: Payer: Self-pay | Admitting: Internal Medicine

## 2012-05-23 ENCOUNTER — Encounter: Payer: Self-pay | Admitting: Internal Medicine

## 2012-05-23 ENCOUNTER — Other Ambulatory Visit (INDEPENDENT_AMBULATORY_CARE_PROVIDER_SITE_OTHER): Payer: Medicaid Other

## 2012-05-23 ENCOUNTER — Telehealth: Payer: Self-pay | Admitting: Gastroenterology

## 2012-05-23 ENCOUNTER — Ambulatory Visit (INDEPENDENT_AMBULATORY_CARE_PROVIDER_SITE_OTHER): Payer: Medicaid Other | Admitting: Internal Medicine

## 2012-05-23 VITALS — BP 110/74 | HR 73 | Ht 63.5 in | Wt 180.2 lb

## 2012-05-23 DIAGNOSIS — R109 Unspecified abdominal pain: Secondary | ICD-10-CM

## 2012-05-23 DIAGNOSIS — Z860101 Personal history of adenomatous and serrated colon polyps: Secondary | ICD-10-CM

## 2012-05-23 DIAGNOSIS — K551 Chronic vascular disorders of intestine: Secondary | ICD-10-CM

## 2012-05-23 DIAGNOSIS — Z86010 Personal history of colonic polyps: Secondary | ICD-10-CM

## 2012-05-23 DIAGNOSIS — K219 Gastro-esophageal reflux disease without esophagitis: Secondary | ICD-10-CM

## 2012-05-23 DIAGNOSIS — R300 Dysuria: Secondary | ICD-10-CM

## 2012-05-23 LAB — CBC
HCT: 40.1 % (ref 36.0–46.0)
Hemoglobin: 13.4 g/dL (ref 12.0–15.0)
RBC: 4.15 Mil/uL (ref 3.87–5.11)
WBC: 8.9 10*3/uL (ref 4.5–10.5)

## 2012-05-23 LAB — BASIC METABOLIC PANEL
CO2: 28 mEq/L (ref 19–32)
Calcium: 9.1 mg/dL (ref 8.4–10.5)
Chloride: 103 mEq/L (ref 96–112)
Creatinine, Ser: 0.6 mg/dL (ref 0.4–1.2)
Glucose, Bld: 89 mg/dL (ref 70–99)

## 2012-05-23 MED ORDER — OXYCODONE HCL 15 MG PO TABS: ORAL_TABLET | ORAL | Status: DC

## 2012-05-23 NOTE — Telephone Encounter (Signed)
lvm for pt to call me back about her appointment with Dr. Gretta Began.

## 2012-05-23 NOTE — Patient Instructions (Addendum)
Your physician has requested that you go to the basement for the following lab work before leaving today: CBC, INR, U/A, Urine Culture, BMP   You will contacted about your referral to the Vascular surgeon.

## 2012-05-23 NOTE — Progress Notes (Signed)
Subjective:    Patient ID: Charliene Inoue, female    DOB: [birth year], 56 y.o.   MRN: [id number]  HPI Mrs. Abril is a 56 yo female with PMH of protein S deficiency with history of DVT on chronic warfarin therapy, hypertension, hyperlipidemia, chronic bronchitis, adenomatous colon polyps and ischemic colitis who seen in followup. I met Mrs. Holik in July 2013 when she was hospitalized with abdominal pain and bloody stools. The presumptive diagnosis at that time was ischemic colitis, and we plan to wait 4-6 weeks before performing colonoscopy to allow for healing. She continued to have moderate to severe abdominal pain and this colonoscopy was performed as an outpatient on 04/27/2012. It revealed 2 ascending colon polyps found to be tubular adenomas without high-grade dysplasia and colitis with ulceration most intense in the splenic flexure descending colon, with some erythema and ulceration at the hepatic flexure. Endoscopically this appeared to be most consistent with ischemic injury, and pathology confirmed ischemic type colitis, stains for CMV negative.  She then underwent a CT angiogram which showed no proximal occlusive disease and changes of colitis at the splenic flexure and descending colon.    She returns today for followup, he continues to have moderate to severe mid and left-sided abdominal pain. This pain is worse after eating. She's not having as much diarrhea as before, but still having loose stools. She's not having any further rectal bleeding. No melena. No fevers. She is eating a low residue diet, but still requiring oxycodone 15 mg every 6 hours. She feels she cannot make it without this pain medication. She is wearing loosefitting pants to avoid worsening her pain. She denies nausea and vomiting. Her reflux and heartburn symptoms have improved greatly with pantoprazole. She is reporting some new urinary symptoms including dysuria, frequency and hesitancy. She's noted her urine to be cloudy over  the last several days but nonbloody.  Review of Systems As per history of present illness, otherwise negative  Current Medications, Allergies, Past Medical History, Past Surgical History, Family History and Social History were reviewed in [employer] record.     Objective:   Physical Exam BP 110/74  Pulse 73  Ht 5' 3.5" (1.613 m)  Wt 180 lb 3.2 oz (81.738 kg)  BMI 31.42 kg/m2 Constitutional: Well-developed and well-nourished. No distress. HEENT: Normocephalic and atraumatic. Oropharynx is clear and moist. No oropharyngeal exudate. Conjunctivae are normal.  No scleral icterus. Neck: Neck supple. Trachea midline. Cardiovascular: Normal rate, regular rhythm and intact distal pulses. No M/R/G Pulmonary/chest: Effort normal and breath sounds normal. No wheezing, rales or rhonchi. Abdominal: Soft, mild to moderate tenderness in the left and lower abdomen without rebound or guarding, nondistended. Bowel sounds active throughout. There are no masses palpable. No hepatosplenomegaly. Extremities: Left greater than right lower extremity edema, mild changes of venous stasis left lower extremity, healing left great toe ulceration  Lymphadenopathy: No cervical adenopathy noted. Neurological: Alert and oriented to person place and time. Skin: Skin is warm and dry. No rashes noted. Psychiatric: Normal mood and affect. Behavior is normal.  CT ANGIOGRAPHY ABDOMEN AND PELVIS -- 05/02/2012   Technique:  Multidetector CT imaging of the abdomen and pelvis was performed using the standard protocol during bolus administration of intravenous contrast.  Multiplanar reconstructed images including MIPs were obtained and reviewed to evaluate the vascular anatomy.   Contrast: OMNIPAQUE IOHEXOL 350 MG/ML SOLN   Comparison:  03/28/2012   Arterial findings: Aorta:  Mildly tortuous, with mild diffuse atheromatous plaque, moderate calcifications in its distal  segment. No dissection, aneurysm, or stenosis.   Celiac axis:            Widely patent.   Superior mesenteric:There is mild ostial plaque resulting in less than 50% diameter stenosis over a short segment.  Vessel is widely patent distally, with replaced right hepatic arterial supply, an anatomic variant.   Left renal:             There are two.  The superior is diminutive, supplying only a portion of the upper pole, at the lower limits of resolution of the scanner.  The inferior left renal artery is dominant, and widely patent.   Right renal:            There are two.  The superior is diminutive, supplying only a portion of the upper pole, at the lower limits of resolution of the scanner.  The inferior right renal artery is dominant, widely patent.   Inferior mesenteric:Diminutive, but patent.   Left iliac:             Scattered eccentric calcified plaque through the common iliac and in the proximal internal iliac without stenosis or aneurysm. External iliac widely patent.   Right iliac:            Moderate scattered calcified nonocclusive plaque through the common iliac and in the proximal internal iliac. External iliac widely patent.   Venous findings:  Patent hepatic veins, portal vein, superior mesenteric vein, splenic vein, bilateral renal veins.  Incomplete opacification of the iliac venous system and IVC due to scan timing.    Review of the MIP images confirms the above findings.   Nonvascular findings: Minimal dependent atelectasis in the visualized lung bases.  Unremarkable liver.  The gallbladder is incompletely distended with some mild mucosal enhancement.  At least one large 2.3 cm gallstone is evident.  Spleen is prominent without focal lesion.  There is a lobular appearance of the left renal parenchyma without focal mass.  No hydronephrosis.  Adrenal glands and pancreas unremarkable.  There is an increased number of left para-aortic and aortocaval lymph  nodes, none measuring greater than 1 cm short axis diameter, stable since the prior exam. Several prominent aortocaval lymph nodes, largest 13 mm, stable. No ascites.  No free air.  Urinary bladder is nondistended. Stomach and small bowel are nondistended.  Appendix not discretely identified.  The colon is nondilated;  there is circumferential wall thickening in the mid and distal descending colon and proximal sigmoid with some mild adjacent inflammatory/edematous changes, similar to the prior exam.  No extraluminal fluid collections or gas.  No ascites.  No free air.  Facet degenerative changes of the lower lumbar spine.   IMPRESSION:   1.  No significant proximal visceral occlusive disease to suggest an etiology of occlusive mesenteric ischemia. 2.  Persistent descending and sigmoid colitis without complicating features. 3.  Cholelithiasis with mild gallbladder mucosal enhancement.     Assessment & Plan:  56 yo female with PMH of protein S deficiency with history of DVT on chronic warfarin therapy, hypertension, hyperlipidemia, chronic bronchitis, adenomatous colon polyps and ischemic colitis who seen in followup.  1. Ischemic colitis, chronic with ongoing abd pain in the setting of Protein S deficiency on anti-coagulation -- by this point, 2 months later, I would've expected her ischemic colitis to have healed with improvement in her abdominal pain. The CT angiogram did not show any definite  proximal visceral occlusive disease, however I would like to refer her to vascular surgery for further evaluation and possible consideration of arteriogram.  Perhaps there is a lesion that we can identify and possibly correct that would explain why she is healing slowly. We have discussed that if she fails to heal her ischemic colitis injury, she may require surgical consultation for consideration of a hemicolectomy. I do not think we are to this point right now. I would like to get her off narcotic  pain medication, but at this point I feel that she likely needs it for pain control. I will refill her oxycodone 15 mg to be used every 6 hours as needed. #120. She knows this needs to last 1 month.  CBC, BMP, and INR today. Return after vascular surgery consult  2.  Dysuria -- symptoms could be consistent with UTI. Will perform urinalysis and culture today.  3.  GERD -- has responded very nicely to pantoprazole, continue at current dose.  4.  History of adenomatous colon polyps -- from an adenomatous colon polyp standpoint, she would be due repeat colonoscopy in 5 years. She is aware of this recommendation

## 2012-05-24 LAB — URINALYSIS, ROUTINE W REFLEX MICROSCOPIC
Nitrite: POSITIVE
Total Protein, Urine: NEGATIVE
pH: 6 (ref 5.0–8.0)

## 2012-05-25 ENCOUNTER — Telehealth: Payer: Self-pay | Admitting: *Deleted

## 2012-05-25 NOTE — Telephone Encounter (Signed)
Message copied by Florene Glen on Thu May 25, 2012  4:43 PM ------      Message from: Beverley Fiedler      Created: Wed May 24, 2012 12:38 PM       Labs reviewed blood count good, metabolic panel good      UA shows UTI with culture pending.  Please start trimethoprim/sulfamethoxazole 1 double strength tab twice a day x3 days      INR 1.8 slightly below therapeutic range, she should contact her Coumadin clinic today

## 2012-05-25 NOTE — Telephone Encounter (Signed)
lmom for pt to call back

## 2012-05-26 MED ORDER — SULFAMETHOXAZOLE-TRIMETHOPRIM 800-160 MG PO TABS: 1.0000 | ORAL_TABLET | Freq: Two times a day (BID) | ORAL | Status: AC

## 2012-05-26 NOTE — Telephone Encounter (Signed)
Dr. Arbie Cookey is a new referral for consult.  She should address any appt regarding that office with their office.  I requested a clinic visit, for any info regarding vasc procedures, she should contact them. Thanks

## 2012-05-26 NOTE — Telephone Encounter (Signed)
Brooke Garcia called back from Dr Bosie Helper ofc. Pt has been scheduled for a Messenteric scan and they realized she had a CT done, so they cancelled the scan and r/s her for an ofc visit. As stated before, she is aware of the appt.

## 2012-05-26 NOTE — Telephone Encounter (Signed)
lmom for triage at Dr Bosie Helper ofc to call back.

## 2012-05-26 NOTE — Telephone Encounter (Signed)
Informed pt of Dr Lauro Franklin findings and recommendations. She will contact the Coumadin Clinic about her INR. Also informed her her INR may be affected by the AB and she should inform the clinic; pt stated understanding. Dr Rhea Belton, pt states Dr Early moved up her procedure to 06/13/12 from 06/27/12; is this long enough? Thanks.

## 2012-05-27 LAB — URINE CULTURE

## 2012-06-12 ENCOUNTER — Encounter: Payer: Self-pay | Admitting: Vascular Surgery

## 2012-06-13 ENCOUNTER — Ambulatory Visit (INDEPENDENT_AMBULATORY_CARE_PROVIDER_SITE_OTHER): Payer: Medicaid Other | Admitting: Vascular Surgery

## 2012-06-13 ENCOUNTER — Encounter: Payer: Self-pay | Admitting: Vascular Surgery

## 2012-06-13 ENCOUNTER — Telehealth: Payer: Self-pay | Admitting: Internal Medicine

## 2012-06-13 VITALS — BP 113/76 | HR 76 | Resp 18 | Ht 63.0 in | Wt 177.0 lb

## 2012-06-13 DIAGNOSIS — N390 Urinary tract infection, site not specified: Secondary | ICD-10-CM

## 2012-06-13 DIAGNOSIS — K559 Vascular disorder of intestine, unspecified: Secondary | ICD-10-CM

## 2012-06-13 NOTE — Telephone Encounter (Signed)
Informed pt that Dr Arbie Cookey wrote Dr Rhea Belton a note about the visit today; he can explain it better than I can. She will come tomorrow at 2:45pm .pt reports her pain is the same as when she first saw Dr Rhea Belton. She has pain after she eats no matter what she eats. Pt wants to know if Dr Rhea Belton will repeat a U/A d/t she is still having back pain and feels she needs to urinate and only dribbles? Explained I will leave Dr Rhea Belton a note; she stated understanding.

## 2012-06-13 NOTE — Telephone Encounter (Signed)
Repeat UA with culture today or tomorrow am.  OV tomorrow pm .

## 2012-06-13 NOTE — Progress Notes (Signed)
[facility]   Patient name: Brooke Garcia MRN: [id number] DOB: 05-22-1956 Sex: female   Referred by: Rhea Belton  Reason for referral:  Chief Complaint  Patient presents with  . New Evaluation    ischemic colitis    HISTORY OF PRESENT ILLNESS: Patient is a 56 year old white female with a recent admission for ischemic colitis. She apparently had chronic renal insufficiency is in the setting of this. She was hospitalized and had eventual recovery. Workup included a CT angiogram which I have reviewed both her report and the actual films. We are Krista Blue discussing the potential correctable cause of mesenteric ischemia or ischemic colitis. The patient does have a history of DVT dating back to her night to her mid 94s.. She thought that she had an arterial embolus but I suspect this was indeed venous. She reports leg swelling and hospitalization with Coumadin therapy and no surgical intervention. She does have history of venous ulceration on her left leg and bilateral chronic swelling with bilateral DVT. She is on chronic Coumadin therapy  Past Medical History  Diagnosis Date  . Tobacco use disorder   . Unspecified urinary incontinence   . Complete rupture of rotator cuff   . Acute venous embolism and thrombosis of unspecified deep vessels of lower extremity   . Calculus of kidney   . Degeneration of intervertebral disc, site unspecified   . Painful respiration   . Other and unspecified noninfectious gastroenteritis and colitis   . HTN (hypertension)   . Family history of ischemic heart disease   . Anxiety state, unspecified   . HLD (hyperlipidemia)   . Primary hypercoagulable state   . Arthritis   . COPD (chronic obstructive pulmonary disease)   . Depression   . History of gallstones   . Protein S deficiency   . Colitis   . Renal failure   . Blood dyscrasia     protein def.    Past Surgical History  Procedure Date  . Thrombectomy / embolectomy femoral  artery     DVT  . Nasal sinus surgery   . Tubal ligation   . Rotator cuff repair     right  . Tubal ligation reversal   . Colonoscopy 04/27/2012    Procedure: COLONOSCOPY;  Surgeon: Beverley Fiedler, MD;  Location: [facility];  Service: Gastroenterology;  Laterality: N/A;    History   Social History  . Marital Status: Single    Spouse Name: N/A    Number of Children: N/A  . Years of Education: N/A   Occupational History  . Not on file.   Social History Main Topics  . Smoking status: Current Every Day Smoker -- 1.0 packs/day for 30 years    Types: Cigarettes  . Smokeless tobacco: Never Used   Comment: 0.5 packs per day  . Alcohol Use: Yes     Wine Occasionally  . Drug Use: No  . Sexually Active: Not on file   Other Topics Concern  . Not on file   Social History Narrative   Married.Occupation: currently disabled    Family History  Problem Relation Age of Onset  . Breast cancer Mother   . Heart attack Father   . Lung cancer Father   . Other Father     Protein S Deficiency  . Hypertension Maternal Grandmother   . Heart attack Paternal Grandmother   . Diabetes Sister   . Colon cancer Maternal Grandmother   . Clotting disorder Father   .  Diabetes Father   . Heart disease Maternal Grandmother     Allergies as of 06/13/2012 - Review Complete 06/13/2012  Allergen Reaction Noted  . Butalbital-apap-caffeine Hives 04/30/2010  . Naproxen Hives   . Tylenol (acetaminophen) Other (See Comments) 08/10/2011    Current Outpatient Prescriptions on File Prior to Visit  Medication Sig Dispense Refill  . alprazolam (XANAX) 2 MG tablet Take 1 tablet (2 mg total) by mouth 3 (three) times daily as needed.  30 tablet  5  . amLODipine (NORVASC) 10 MG tablet take 1 tablet by mouth once daily  90 tablet  3  . celecoxib (CELEBREX) 200 MG capsule Take 1 capsule (200 mg total) by mouth daily.  30 capsule  11  . diclofenac sodium (VOLTAREN) 1 % GEL Apply 1 application topically 4 (four)  times daily as needed. For neck pain.  100 g  11  . oxyCODONE (ROXICODONE) 15 MG immediate release tablet Take one tablet by mouth every 6 hours when needed for pain.  120 tablet  0  . pantoprazole (PROTONIX) 40 MG tablet Take 1 tablet (40 mg total) by mouth daily.  30 tablet  6  . SPIRIVA HANDIHALER 18 MCG inhalation capsule inhale contents of 1 capsule by mouth once daily  30 capsule  2  . warfarin (COUMADIN) 7.5 MG tablet Take 1 tablet (7.5 mg total) by mouth daily.  30 tablet  2  . tiotropium (SPIRIVA) 18 MCG inhalation capsule Place 18 mcg into inhaler and inhale daily.           REVIEW OF SYSTEMS:  Positives indicated with an "X"  CARDIOVASCULAR:  [x ] chest pain   [ x] chest pressure   [ ]  palpitations   [x ] orthopnea   [x ] dyspnea on exertion   [ ]  claudication   [ ]  rest pain   [x ] DVT   [x ] phlebitis PULMONARY:   [x ] productive cough   [ ]  asthma   [ ]  wheezing NEUROLOGIC:   [x ] weakness  [x ] paresthesias  [ ]  aphasia  [ ]  amaurosis  [x ] dizziness HEMATOLOGIC:   [x ] bleeding problems   [x ] clotting disorders MUSCULOSKELETAL:  [ ]  joint pain   [ ]  joint swelling GASTROINTESTINAL: [ ]   blood in stool  [ ]   hematemesis GENITOURINARY:  [ ]   dysuria  [ ]   hematuria PSYCHIATRIC:  [ ]  history of major depression INTEGUMENTARY:  [ ]  rashes  [ ]  ulcers CONSTITUTIONAL:  [x ] fever   [x ] chills  PHYSICAL EXAMINATION:  General: The patient is a well-nourished female, in no acute distress. Vital signs are BP 113/76  Pulse 76  Resp 18  Ht 5\' 3"  (1.6 m)  Wt 177 lb (80.287 kg)  BMI 31.35 kg/m2 Pulmonary: There is a good air exchange bilaterally without wheezing or rales. Abdomen: Soft and non-tender with normal pitch bowel sounds. Mild tenderness in her left abdomen. No rebound Musculoskeletal: There are no major deformities.  There is no significant extremity pain. She does have significant swelling and evidence of healed ulcer on her left leg Neurologic: No focal weakness  or paresthesias are detected, Skin: There are no ulcer or rashes noted. Psychiatric: The patient has normal affect. Cardiovascular: There is a regular rate and rhythm without significant murmur appreciated.  CT angiogram reviewed from 05/23/2012. This shows a patent celiac sprue mesenteric artery and inferior mesenteric artery. There is mild narrowing at her proximal  superior mesenteric artery pressure 12 cm from the origin of her aorta.   Impression and Plan:  Had a long discussion with the patient regarding all of this. I do not see any evidence of correctable mesenteric ischemia. I explained with a widely patent celiac and SMA aside from a mild narrowing at the artery near the origin patent the intermesenteric artery do not feel that any correctable ischemia. She was reassured this discussion and will see [place] again on an as-needed basis. Regarding her lower surety swelling and chronic venous hypertension. I did explain the critical importance of compression garments and she was given a prescription for knee high 20-30 mm mercury compression    Mirah Nevins [facility] [place] Office: [phone]

## 2012-06-14 ENCOUNTER — Ambulatory Visit: Payer: Medicaid Other | Admitting: Internal Medicine

## 2012-06-19 ENCOUNTER — Encounter: Payer: Self-pay | Admitting: Internal Medicine

## 2012-06-20 ENCOUNTER — Other Ambulatory Visit (INDEPENDENT_AMBULATORY_CARE_PROVIDER_SITE_OTHER): Payer: Medicaid Other

## 2012-06-20 ENCOUNTER — Encounter: Payer: Self-pay | Admitting: Internal Medicine

## 2012-06-20 ENCOUNTER — Ambulatory Visit (INDEPENDENT_AMBULATORY_CARE_PROVIDER_SITE_OTHER): Payer: Medicaid Other | Admitting: Internal Medicine

## 2012-06-20 ENCOUNTER — Other Ambulatory Visit: Payer: Self-pay | Admitting: *Deleted

## 2012-06-20 VITALS — BP 110/70 | HR 72 | Ht 63.5 in | Wt 178.6 lb

## 2012-06-20 DIAGNOSIS — K551 Chronic vascular disorders of intestine: Secondary | ICD-10-CM

## 2012-06-20 DIAGNOSIS — R109 Unspecified abdominal pain: Secondary | ICD-10-CM

## 2012-06-20 DIAGNOSIS — Z720 Tobacco use: Secondary | ICD-10-CM

## 2012-06-20 DIAGNOSIS — R791 Abnormal coagulation profile: Secondary | ICD-10-CM

## 2012-06-20 DIAGNOSIS — F17200 Nicotine dependence, unspecified, uncomplicated: Secondary | ICD-10-CM

## 2012-06-20 LAB — BASIC METABOLIC PANEL
CO2: 26 mEq/L (ref 19–32)
Calcium: 9.1 mg/dL (ref 8.4–10.5)
Chloride: 99 mEq/L (ref 96–112)
Creatinine, Ser: 0.7 mg/dL (ref 0.4–1.2)
Glucose, Bld: 90 mg/dL (ref 70–99)
Sodium: 137 mEq/L (ref 135–145)

## 2012-06-20 LAB — URINALYSIS WITH CULTURE, IF INDICATED
Ketones, ur: NEGATIVE
Specific Gravity, Urine: 1.02 (ref 1.000–1.030)
Urine Glucose: NEGATIVE

## 2012-06-20 LAB — CBC
Hemoglobin: 13.7 g/dL (ref 12.0–15.0)
MCHC: 33.5 g/dL (ref 30.0–36.0)
MCV: 94.7 fl (ref 78.0–100.0)
Platelets: 287 10*3/uL (ref 150.0–400.0)
RBC: 4.31 Mil/uL (ref 3.87–5.11)

## 2012-06-20 LAB — PROTIME-INR: INR: 6.2 ratio (ref 0.8–1.0)

## 2012-06-20 MED ORDER — OXYCODONE HCL 15 MG PO TABS: ORAL_TABLET | ORAL | Status: DC

## 2012-06-20 NOTE — Patient Instructions (Addendum)
Your physician has requested that you go to the basement for the following lab work before leaving today: CBC, INR, U/A BMP  Please call us once you get info from medicaid.  Primary care needs to be established!

## 2012-06-20 NOTE — Progress Notes (Signed)
Subjective:    Patient ID: Brooke Garcia, female    DOB: [birth year], 56 y.o.   MRN: [id number]  HPI Brooke Garcia is a 56 yo female with PMH of protein S deficiency with history of DVT on chronic warfarin therapy, hypertension, hyperlipidemia, chronic bronchitis, adenomatous colon polyps and ischemic colitis who seen in followup. She returns today with ongoing abdominal pain. This is a daily and constant pain for her. She has been using oxycodone for the pain, but her prescription has run out. Her bowel habits are mostly regular, but on occasion she has loose stools. Her stools are nonbloody and she denies melena. Her appetite is decreased but she is able to eat. She has not lost weight. She does feel increased lower extremity edema. She's having some lower back pain which radiates to bilateral hips. No fevers or chills. No heartburn. She is taking her warfarin, but right now no primary care provider is managing her INR. She does not currently have a primary care provider do to issues with her Medicaid and being assigned a specific provider. In the past she has seen Dr. Sanda Linger.  She reports growing tired of ongoing pain. Her dysuria has resolved, but occasion she has urinary incontinence and at other times difficulty initiating urination.  She was seen by Dr. Arbie Cookey with gastric surgery who did not feel vascular intervention was necessary in any target mesenteric vessel.  Review of Systems As per history of present illness, otherwise negative  Current Medications, Allergies, Past Medical History, Past Surgical History, Family History and Social History were reviewed in [employer] record.     Objective:   Physical Exam BP 110/70  Pulse 72  Ht 5' 3.5" (1.613 m)  Wt 178 lb 9.6 oz (81.012 kg)  BMI 31.14 kg/m2 Constitutional: Well-developed and well-nourished. No distress. HEENT: Normocephalic and atraumatic. Oropharynx is clear and moist. No oropharyngeal exudate.  Conjunctivae are normal.  No scleral icterus. Cardiovascular: Normal rate, regular rhythm and intact distal pulses. No M/R/G Pulmonary/chest: And expiratory wheezes, mild, no rales or rhonchi Abdominal: Soft, tender to palpation mid and lower abdomen, worse on the left without rebound or guarding, mild distention. Bowel sounds active throughout.  Extremities: no clubbing, cyanosis, 2+ LE edema, scattered ecchymoses upper extremities b/l Neurological: Alert and oriented to person place and time. Skin: Skin is warm and dry. No rashes noted. Psychiatric: Normal mood and affect. Behavior is normal.  Labs pending     Assessment & Plan:  56 yo female with PMH of protein S deficiency with history of DVT on chronic warfarin therapy, hypertension, hyperlipidemia, chronic bronchitis, adenomatous colon polyps and ischemic colitis who seen in followup.  1.  Chronic ischemic colitis -- the patient has ongoing issues with abdominal pain and had ischemic colitis by biopsy at colonoscopy in August, and persistent colitis by CT later in August 2013.  She is seeing vascular surgery and at this time no intervention is deemed necessary.  I'm concerned she continues to have abdominal pain and at this point feel that we need to document unresolved colitis. We have discussed repeating the colonoscopy to evaluate her previously seen ischemic colitis.  If this is persistent, she will likely be referred to general surgery for consideration of partial colectomy for chronic, nonhealing ischemic colitis.  --Prior to colonoscopy I need to check an INR and she needs to establish primary care immediately for management of her warfarin and other medical problems. I feel that she will need Lovenox bridge prior  to repeat colonoscopy.  We will try to work this issue out in the coming days before scheduling colonoscopy  --I have refilled oxycodone 15 mg every 6 hours prn  2.  Smoking -- I strongly encouraged her to stop smoking as  this impairs wound healing in leads to worsening vascular disease.  She has cut back and will try to cut back further.

## 2012-06-21 ENCOUNTER — Other Ambulatory Visit: Payer: Self-pay | Admitting: Gastroenterology

## 2012-06-21 ENCOUNTER — Telehealth: Payer: Self-pay | Admitting: Gastroenterology

## 2012-06-21 DIAGNOSIS — D689 Coagulation defect, unspecified: Secondary | ICD-10-CM

## 2012-06-21 DIAGNOSIS — K559 Vascular disorder of intestine, unspecified: Secondary | ICD-10-CM

## 2012-06-21 NOTE — Telephone Encounter (Signed)
Pt called in and said she went ot [facility] in [place] to have them to start managing her Warfarin. I reminder her that Dr. Rhea Belton wanted her to establish Primary care, and when she was in clinic yesterday she told us she was going to go down to social services to have a PCP put on her Medicaid card.  she gave so may excuses as to why she can't and doesn't want to go to social services. Marland Kitchen Spoke to Dr. Rhea Belton and he wants her referred to hematology. I put the referral in, called [phone] to try to speed it up a little. Got a recording and left a message. i will call back again tomorrow.

## 2012-06-22 ENCOUNTER — Telehealth: Payer: Self-pay | Admitting: Gastroenterology

## 2012-06-22 NOTE — Telephone Encounter (Signed)
lvm for Hematology wanting to confirm they received my referral for pt.

## 2012-06-23 ENCOUNTER — Other Ambulatory Visit (INDEPENDENT_AMBULATORY_CARE_PROVIDER_SITE_OTHER): Payer: Medicaid Other

## 2012-06-23 ENCOUNTER — Telehealth: Payer: Self-pay

## 2012-06-23 DIAGNOSIS — Z7901 Long term (current) use of anticoagulants: Secondary | ICD-10-CM

## 2012-06-23 DIAGNOSIS — R791 Abnormal coagulation profile: Secondary | ICD-10-CM

## 2012-06-23 LAB — PROTIME-INR: Prothrombin Time: 28.2 s — ABNORMAL HIGH (ref 10.2–12.4)

## 2012-06-23 MED ORDER — WARFARIN SODIUM 5 MG PO TABS: 5.0000 mg | ORAL_TABLET | Freq: Every day | ORAL | Status: DC

## 2012-06-23 NOTE — Telephone Encounter (Signed)
Per Dr. Rhea Belton patient to resume coumadin today at 5 mg.  She will come for PT/INR 06/27/12.  I have checked with Tiffany at Oncology and they will contact her today for her Hematology appt.  She has an appt with a new primary care on Wed a Dr. Bascom Levels.  She is advised of all the above and the plan to schedule a colonoscopy with lovenox bridge asap.  Patient verbalized understanding of all the above and to contact us to schedule the colonoscopy once she has seen hematology.

## 2012-06-27 ENCOUNTER — Encounter: Payer: Medicaid Other | Admitting: Vascular Surgery

## 2012-06-27 ENCOUNTER — Other Ambulatory Visit: Payer: Medicaid Other

## 2012-06-27 ENCOUNTER — Other Ambulatory Visit (INDEPENDENT_AMBULATORY_CARE_PROVIDER_SITE_OTHER): Payer: Medicaid Other

## 2012-06-27 DIAGNOSIS — Z5181 Encounter for therapeutic drug level monitoring: Secondary | ICD-10-CM

## 2012-06-27 DIAGNOSIS — Z7901 Long term (current) use of anticoagulants: Secondary | ICD-10-CM

## 2012-06-28 ENCOUNTER — Telehealth: Payer: Self-pay | Admitting: Oncology

## 2012-06-28 NOTE — Telephone Encounter (Signed)
S/W in re NP appt 10/28 @ 3:30 w/Dr. Welton Flakes.  Referring Dr. Rhea Belton Dx- Ischemic Colitis NP packet mailed.

## 2012-06-29 ENCOUNTER — Telehealth: Payer: Self-pay | Admitting: Oncology

## 2012-06-29 NOTE — Telephone Encounter (Signed)
C/D 06/29/12 for appt.07/10/12

## 2012-06-29 NOTE — Telephone Encounter (Signed)
Pt was seen in the office.

## 2012-07-03 ENCOUNTER — Telehealth: Payer: Self-pay | Admitting: Internal Medicine

## 2012-07-03 NOTE — Telephone Encounter (Signed)
I cannot refill pain medication early, esp so early.  She was given that Rx on 10/8 for 1 month. She needs to discuss this with her PCP, which she is trying to establish Pain clinic referral can be offered Has she seen hematology? Once she has we can get her colonoscopy scheduled based on their recommendations with her anticoagulation

## 2012-07-03 NOTE — Telephone Encounter (Signed)
Informed pt of Dr Lauro Franklin remarks as stated below; she stated he can't do this to me! She reports she can't help it if she can't get an appt with a PCP. She has called Dr Janey Greaser ofc and he works varied hours. Dr Pollyann Kennedy she reports wasn't there when she went yo the appt and no one called her. She reports Dr Yetta Barre in [facility] was her doc until they stopped seeing Medicaid pts. She see Dr Welton Flakes next week at the [facility], 07/10/12. Pt went on and on with what's she's been through and that she has done everything she was told to do; pt has been seeing [place] since July, 2013. Advised pt to use her remaining pain pills sparingly since Dr Rhea Belton has left for the day. Also, advised her to try to get an appt with a PCP. I offered her a pain clinic referral, but she never answered, since her meds run out tomorrow.

## 2012-07-03 NOTE — Telephone Encounter (Signed)
Pt called to report she will run out of her pain meds tomorrow. She states she just took them when she needed them and didn't pay attention to the time. She was given 120 tabs of oxycodone 15mg  on 06/20/12 to take q6hrs. Pt states d/t Medicaid "rules" , you will have to change the script either to 1 tabs q4 hours or raise the med to 20mg  . Please advise.

## 2012-07-04 NOTE — Telephone Encounter (Signed)
Pt called back and I informed her that Dr Pollyann Kennedy is in and to see him and ask if he will be here PCP. He may call us for any questions; pt stated understanding.

## 2012-07-04 NOTE — Telephone Encounter (Signed)
We will address this later today. Can we please call [place]'s office and see if she will see the patient as her PCP. Thanks

## 2012-07-04 NOTE — Telephone Encounter (Signed)
Called [facility], [phone], and Dr Pollyann Kennedy is there until 2PM today on a walk in basis. lmom for pt to call back.

## 2012-07-10 ENCOUNTER — Other Ambulatory Visit: Payer: Medicaid Other | Admitting: Lab

## 2012-07-10 ENCOUNTER — Ambulatory Visit: Payer: Medicaid Other

## 2012-07-10 ENCOUNTER — Ambulatory Visit: Payer: Medicaid Other | Admitting: Oncology

## 2012-07-16 ENCOUNTER — Other Ambulatory Visit: Payer: Self-pay | Admitting: Internal Medicine

## 2012-07-17 ENCOUNTER — Telehealth: Payer: Self-pay | Admitting: Internal Medicine

## 2012-07-17 ENCOUNTER — Encounter: Payer: Self-pay | Admitting: Oncology

## 2012-07-17 ENCOUNTER — Ambulatory Visit: Payer: Medicaid Other

## 2012-07-17 ENCOUNTER — Other Ambulatory Visit: Payer: Self-pay | Admitting: Emergency Medicine

## 2012-07-17 ENCOUNTER — Other Ambulatory Visit (HOSPITAL_BASED_OUTPATIENT_CLINIC_OR_DEPARTMENT_OTHER): Payer: Medicaid Other | Admitting: Lab

## 2012-07-17 ENCOUNTER — Ambulatory Visit (HOSPITAL_BASED_OUTPATIENT_CLINIC_OR_DEPARTMENT_OTHER): Payer: Medicaid Other | Admitting: Oncology

## 2012-07-17 ENCOUNTER — Telehealth: Payer: Self-pay | Admitting: Oncology

## 2012-07-17 VITALS — BP 146/87 | HR 78 | Temp 97.8°F | Resp 20 | Ht 63.5 in | Wt 174.3 lb

## 2012-07-17 DIAGNOSIS — I82409 Acute embolism and thrombosis of unspecified deep veins of unspecified lower extremity: Secondary | ICD-10-CM

## 2012-07-17 DIAGNOSIS — K551 Chronic vascular disorders of intestine: Secondary | ICD-10-CM

## 2012-07-17 DIAGNOSIS — D6859 Other primary thrombophilia: Secondary | ICD-10-CM

## 2012-07-17 DIAGNOSIS — Z7901 Long term (current) use of anticoagulants: Secondary | ICD-10-CM

## 2012-07-17 LAB — COMPREHENSIVE METABOLIC PANEL (CC13)
AST: 49 U/L — ABNORMAL HIGH (ref 5–34)
Albumin: 3.7 g/dL (ref 3.5–5.0)
Alkaline Phosphatase: 109 U/L (ref 40–150)
Potassium: 4 mEq/L (ref 3.5–5.1)
Sodium: 140 mEq/L (ref 136–145)
Total Protein: 8.2 g/dL (ref 6.4–8.3)

## 2012-07-17 LAB — CBC WITH DIFFERENTIAL/PLATELET
BASO%: 1 % (ref 0.0–2.0)
Basophils Absolute: 0.1 10*3/uL (ref 0.0–0.1)
EOS%: 4.6 % (ref 0.0–7.0)
HGB: 14.4 g/dL (ref 11.6–15.9)
MCH: 32 pg (ref 25.1–34.0)
RDW: 13.1 % (ref 11.2–14.5)
lymph#: 3.4 10*3/uL — ABNORMAL HIGH (ref 0.9–3.3)

## 2012-07-17 LAB — PROTIME-INR
INR: 3.1 (ref 2.00–3.50)
Protime: 37.2 Seconds — ABNORMAL HIGH (ref 10.6–13.4)

## 2012-07-17 NOTE — Telephone Encounter (Signed)
gve the pt her dec 2013 appt calendar °

## 2012-07-17 NOTE — Progress Notes (Signed)
Checked in new patient. No financial issues. °

## 2012-07-17 NOTE — Progress Notes (Signed)
[facility]  Telephone:([phone] Fax:(336) [phone]     INITIAL HEMATOLOGY CONSULTATION    Referral MD:   Dr. Erick Blinks  Reason for Referral: 56 year old female with prior history of protein C and protein S deficiency. Patient has been followed by me in the past at [facility]. She is now reestablishing her care after 4 years of being lost to followup.   HPI: 56 year old female with medical history significant for multiple thrombosis. She was eventually found to be protein C and protein S deficient. She has been on chronic anticoagulation with Coumadin. Patient also has a history of chronic vascular insufficiency of intestines with chronic ischemic colitis. She is currently being seen by Dr. Erick Blinks. He is planning on doing a colonoscopy to evaluate extent of large bowel ischemia. Per patient is eventually planned that she will be seen by vascular surgery for possible resection of her bowel due to significant abdominal pain. Patient has been on Coumadin chronically overall she does well with it. Her INRs have been remaining in the therapeutic range. But because patient is planning on having colonoscopy I. am asked to help with managing her anticoagulation. She is without any bleeding problems and no other complaints.   Past Medical History  Diagnosis Date  . Tobacco use disorder   . Unspecified urinary incontinence   . Complete rupture of rotator cuff   . Acute venous embolism and thrombosis of unspecified deep vessels of lower extremity   . Calculus of kidney   . Degeneration of intervertebral disc, site unspecified   . Painful respiration   . Other and unspecified noninfectious gastroenteritis and colitis   . HTN (hypertension)   . Family history of ischemic heart disease   . Anxiety state, unspecified   . HLD (hyperlipidemia)   . Primary hypercoagulable state   . Arthritis   . COPD (chronic obstructive pulmonary disease)   .  Depression   . History of gallstones   . Protein S deficiency   . Colitis   . Renal failure   . Blood dyscrasia     protein def.  :    Past Surgical History  Procedure Date  . Thrombectomy / embolectomy femoral artery     DVT  . Nasal sinus surgery   . Tubal ligation   . Rotator cuff repair     right  . Tubal ligation reversal   . Colonoscopy 04/27/2012    Procedure: COLONOSCOPY;  Surgeon: Beverley Fiedler, MD;  Location: [facility];  Service: Gastroenterology;  Laterality: N/A;  :   CURRENT MEDS: Current Outpatient Prescriptions  Medication Sig Dispense Refill  . alprazolam (XANAX) 2 MG tablet Take 1 tablet (2 mg total) by mouth 3 (three) times daily as needed.  30 tablet  5  . amLODipine (NORVASC) 10 MG tablet take 1 tablet by mouth once daily  90 tablet  3  . diclofenac sodium (VOLTAREN) 1 % GEL Apply 1 application topically 4 (four) times daily as needed. For neck pain.  100 g  11  . oxyCODONE (ROXICODONE) 15 MG immediate release tablet Take one tablet by mouth every 6 hours when needed for pain.  120 tablet  0  . pantoprazole (PROTONIX) 40 MG tablet Take 1 tablet (40 mg total) by mouth daily.  30 tablet  6  . SPIRIVA HANDIHALER 18 MCG inhalation capsule inhale contents of 1 capsule by mouth once daily  30 capsule  2  . warfarin (COUMADIN) 5  MG tablet Take 1 tablet (5 mg total) by mouth daily.  30 tablet  1  . celecoxib (CELEBREX) 200 MG capsule Take 200 mg by mouth daily.      Marland Kitchen tiotropium (SPIRIVA) 18 MCG inhalation capsule Place 18 mcg into inhaler and inhale daily.            Allergies  Allergen Reactions  . Butalbital-Apap-Caffeine Hives  . Naproxen Hives  . Tylenol (Acetaminophen) Other (See Comments)    Upset stomach  :  Family History  Problem Relation Age of Onset  . Breast cancer Mother   . Heart attack Father   . Lung cancer Father   . Other Father     Protein S Deficiency  . Hypertension Maternal Grandmother   . Heart attack Paternal Grandmother     . Diabetes Sister   . Colon cancer Maternal Grandmother   . Clotting disorder Father   . Diabetes Father   . Heart disease Maternal Grandmother   :  History   Social History  . Marital Status: Single    Spouse Name: N/A    Number of Children: N/A  . Years of Education: N/A   Occupational History  . Not on file.   Social History Main Topics  . Smoking status: Current Every Day Smoker -- 1.0 packs/day for 30 years    Types: Cigarettes  . Smokeless tobacco: Never Used     Comment: 0.5 packs per day  . Alcohol Use: Yes     Comment: Wine Occasionally  . Drug Use: No  . Sexually Active: Not Currently   Other Topics Concern  . Not on file   Social History Narrative   Married.Occupation: currently disabled  :  REVIEW OF SYSTEM:  The rest of the 14-point review of sytem was negative.   Exam: Filed Vitals:   07/17/12 1430  BP: 146/87  Pulse: 78  Temp: 97.8 F (36.6 C)  TempSrc: Oral  Resp: 20  Height: 5' 3.5" (1.613 m)  Weight: 174 lb 4.8 oz (79.062 kg)     General:  well-nourished in no acute distress.  Eyes:  no scleral icterus.  ENT:  There were no oropharyngeal lesions.  Neck was without thyromegaly.  Lymphatics:  Negative cervical, supraclavicular or axillary adenopathy.  Respiratory: lungs were clear bilaterally without wheezing or crackles.  Cardiovascular:  Regular rate and rhythm, S1/S2, without murmur, rub or gallop.  There was no pedal edema.  GI:  abdomen was soft, flat, nontender, nondistended, without organomegaly.  Muscoloskeletal:  no spinal tenderness of palpation of vertebral spine.  Skin exam was without echymosis, petichae.  Neuro exam was nonfocal.  Patient was able to get on and off exam table without assistance.  Gait was normal.  Patient was alerted and oriented.  Attention was good.   Language was appropriate.  Mood was normal without depression.  Speech was not pressured.  Thought content was not tangential.    LABS:  Lab Results  Component  Value Date   WBC 8.7 07/17/2012   HGB 14.4 07/17/2012   HCT 41.8 07/17/2012   PLT 269 07/17/2012   GLUCOSE 90 06/20/2012   CHOL 188 07/04/2007   TRIG 52 07/04/2007   HDL 66.6 07/04/2007   LDLCALC 111* 07/04/2007   ALT 16 03/31/2012   AST 18 03/31/2012   NA 137 06/20/2012   K 3.3* 06/20/2012   CL 99 06/20/2012   CREATININE 0.7 06/20/2012   BUN 7 06/20/2012   CO2 26 06/20/2012  INR 3.10 07/17/2012    ASSESSMENT AND PLAN: 56 year old female with protein C and protein S deficiency on chronic anticoagulation due to multiple episodes of thrombosis. Now with ischemic colitis. She is gong to undergo a colonoscopy. At this time recommendation is for patient to come off of Coumadin today's prior to the colonoscopy. Certainly we could reach her with Lovenox. However I would like to know when her colonoscopy will be scheduled so that we can make the arrangements for giving her a prescription for Lovenox. I suspect her dose is going to be about 100 mg twice a day. She knows how to inject herself so this makes it very convenient. Once patient has had her colonoscopy performed we certainly can convert her back to Coumadin. All questions were answered today. Patient is going to let me know exactly when the colonoscopy will be performed. I need at least 2 weeks advanced notice to be able to get her converted to Lovenox.  The length of time of the face-to-face encounter was 60 minutes. More than 50% of time was spent counseling and coordination of care.   Drue Second, MD Medical/Oncology [facility], [place] [phone] (beeper) [phone] (Office)  07/17/2012, 3:13 PM

## 2012-07-17 NOTE — Patient Instructions (Addendum)
You can have a colonoscopy as recommended by Dr. Rhea Belton, I will need at least a 2 week notice so that we can get you bridged with lovenox prior to the colonocopy

## 2012-07-18 NOTE — Telephone Encounter (Signed)
Pt saw Dr Welton Flakes yesterday and she is cleared to have her COLON, but Dr Welton Flakes needs 2 weeks notice to order the Coumadin-Lovenox bridge. Informed pt I will send Dr Welton Flakes a note and if the appt can be moved up, we will try to [facility] her.  Dr Welton Flakes, pt is scheduled for her Colonoscopy on 08/02/12. Can you please inform the pt of her instructions for the Lovenox bridge; hopefully, you will order the med for her? Also, we may be able to move her appt up if you have time to arrange everything. Thanks, Graciella Freer, RN for Dr Erick Blinks

## 2012-07-20 LAB — HYPERCOAGULABLE PANEL, COMPREHENSIVE
Beta-2 Glyco I IgG: 2 G Units (ref <20)
DRVVT 1:1 Mix: 36.6 secs (ref <42.9)
DRVVT: 50 secs — ABNORMAL HIGH (ref <42.9)
Lupus Anticoagulant: NOT DETECTED
Protein C Activity: <15 % — ABNORMAL LOW (ref 75–133)
Protein S Activity: 17 % — ABNORMAL LOW (ref 69–129)
Protein S Total: 17 % — ABNORMAL LOW (ref 60–150)

## 2012-07-24 ENCOUNTER — Telehealth: Payer: Self-pay | Admitting: *Deleted

## 2012-07-24 ENCOUNTER — Ambulatory Visit (AMBULATORY_SURGERY_CENTER): Payer: Medicaid Other | Admitting: *Deleted

## 2012-07-24 VITALS — Ht 63.0 in | Wt 171.0 lb

## 2012-07-24 DIAGNOSIS — K551 Chronic vascular disorders of intestine: Secondary | ICD-10-CM

## 2012-07-24 MED ORDER — MOVIPREP 100 G PO SOLR: ORAL | Status: DC

## 2012-07-24 NOTE — Telephone Encounter (Signed)
pt called request Lovenox coumadin bridge prior to colonoscopy 11/20 .Will review with MD.Next f/u 08/14/12

## 2012-07-24 NOTE — Telephone Encounter (Signed)
We will get the patient in tomorrow to start the process of getting her off coumadin

## 2012-07-24 NOTE — Telephone Encounter (Signed)
Per MD, pt to be seen 07/25/12 at 1030. Pt notified, and states " Do I really need to see her? I have given myself lovenox shots in the past and I'm very familiar with how to do it.. Maybe if she could just call in a Rx instead of me coming over there." Informed pt I will pass her concern to MD and call her back with futher instructions. Onc tx sent

## 2012-07-24 NOTE — Telephone Encounter (Signed)
Reviewed with MD, pt will need to be seen on 11/12  1030. Pt notified verbalized understanding.

## 2012-07-24 NOTE — Telephone Encounter (Signed)
Pt here for Previsit today and does not have instructions from Dr. Welton Flakes for holding Coumadin and Lovenox bridge.  Talked with Graciella Freer; she placed call to Dr Milta Deiters nurse concerning need for Coumadin instructions.  Pt instructed in previsit to call Dr. Milta Deiters office and Aram Beecham within 2 days to see if instructions are available.  Pt knows that she cannot have procedure without instructions for holding Coumadin.

## 2012-07-24 NOTE — Telephone Encounter (Signed)
I need to see this patient tomorrow at 10:30

## 2012-07-25 ENCOUNTER — Ambulatory Visit (HOSPITAL_BASED_OUTPATIENT_CLINIC_OR_DEPARTMENT_OTHER): Payer: Medicaid Other | Admitting: Oncology

## 2012-07-25 ENCOUNTER — Telehealth: Payer: Self-pay | Admitting: *Deleted

## 2012-07-25 ENCOUNTER — Encounter: Payer: Self-pay | Admitting: Oncology

## 2012-07-25 VITALS — BP 146/79 | HR 87 | Temp 98.0°F | Resp 20 | Ht 63.0 in | Wt 169.7 lb

## 2012-07-25 DIAGNOSIS — I82409 Acute embolism and thrombosis of unspecified deep veins of unspecified lower extremity: Secondary | ICD-10-CM

## 2012-07-25 DIAGNOSIS — D6859 Other primary thrombophilia: Secondary | ICD-10-CM

## 2012-07-25 DIAGNOSIS — Z7901 Long term (current) use of anticoagulants: Secondary | ICD-10-CM

## 2012-07-25 MED ORDER — ENOXAPARIN SODIUM 150 MG/ML ~~LOC~~ SOLN: 1.5000 mg/kg | Freq: Two times a day (BID) | SUBCUTANEOUS | Status: DC

## 2012-07-25 NOTE — Telephone Encounter (Signed)
Gave patient appointment for 07-27-2012 and 08-03-2012

## 2012-07-25 NOTE — Patient Instructions (Addendum)
Follow the instructions below:  11/12 - 11/13 no coumadin  11/14: we will check Pt/INR and you will see NP Mardella Layman) and we will decide about putting you on lovenox   11/15 - 11/18 lovenox injections (115 mg injection every 12 hours)  11/19 no lovenox  11/20 no lovenox proceed with your colonoscopy  11/21 see NP Mardella Layman and then we will decide about putting you back on Lovenox and coumadin  Call will any questions

## 2012-07-27 ENCOUNTER — Telehealth: Payer: Self-pay | Admitting: Internal Medicine

## 2012-07-27 ENCOUNTER — Ambulatory Visit (HOSPITAL_BASED_OUTPATIENT_CLINIC_OR_DEPARTMENT_OTHER): Payer: Medicaid Other | Admitting: Adult Health

## 2012-07-27 ENCOUNTER — Encounter: Payer: Self-pay | Admitting: Adult Health

## 2012-07-27 ENCOUNTER — Other Ambulatory Visit (HOSPITAL_BASED_OUTPATIENT_CLINIC_OR_DEPARTMENT_OTHER): Payer: Medicaid Other | Admitting: Lab

## 2012-07-27 VITALS — BP 134/80 | HR 79 | Temp 98.2°F | Resp 20 | Ht 63.0 in | Wt 169.7 lb

## 2012-07-27 DIAGNOSIS — Z7901 Long term (current) use of anticoagulants: Secondary | ICD-10-CM

## 2012-07-27 DIAGNOSIS — D6859 Other primary thrombophilia: Secondary | ICD-10-CM

## 2012-07-27 DIAGNOSIS — I82403 Acute embolism and thrombosis of unspecified deep veins of lower extremity, bilateral: Secondary | ICD-10-CM

## 2012-07-27 NOTE — Patient Instructions (Signed)
INR is 1.5.  Start taking your Lovenox tonight.  Your last dose of lovenox should be 11/18 evening dose.  Do not take lovenox on 11/19, or 11/20.  We will see you back on 11/21 for follow up.

## 2012-07-27 NOTE — Telephone Encounter (Signed)
Pt reports she has stopped her Coumadin and has started Lovenox injections. She has written instructions on the bid lovenox. She is to stop the lovenox on 07/31/12 and [facility]ags on 08/01/12 if Dr Rhea Belton wants. Dr Rhea Belton, do you want coags the day before COLON on 08/02/12. Thanks.

## 2012-07-27 NOTE — Progress Notes (Signed)
OFFICE PROGRESS NOTE  CC  MASSENBURG,O'LAF, PA [address] [place] [address]  DIAGNOSIS: Protein C and S deficiency, multiple thrombosis  PRIOR THERAPY: Chronic Coumadin  CURRENT THERAPY: Lovenox  INTERVAL HISTORY: Brooke Garcia 56 y.o. female returns for follow up.  She is scheduled for her colonoscopy on 11/20.  She has stopped her Coumadin and her INR today is 1.5.  She has no bleeding problems, swelling, shortness of breath or any questions/concerns. She has gotten her Lovenox filled.    MEDICAL HISTORY: Past Medical History  Diagnosis Date  . Tobacco use disorder   . Unspecified urinary incontinence   . Complete rupture of rotator cuff   . Acute venous embolism and thrombosis of unspecified deep vessels of lower extremity   . Calculus of kidney   . Degeneration of intervertebral disc, site unspecified   . Painful respiration   . Other and unspecified noninfectious gastroenteritis and colitis   . HTN (hypertension)   . Family history of ischemic heart disease   . Anxiety state, unspecified   . HLD (hyperlipidemia)   . Primary hypercoagulable state   . Arthritis   . COPD (chronic obstructive pulmonary disease)   . Depression   . History of gallstones   . Protein S deficiency   . Colitis   . Renal failure   . Blood dyscrasia     protein def.    ALLERGIES:  is allergic to butalbital-apap-caffeine; naproxen; and tylenol.  MEDICATIONS:  Current Outpatient Prescriptions  Medication Sig Dispense Refill  . alprazolam (XANAX) 2 MG tablet Take 1 tablet (2 mg total) by mouth 3 (three) times daily as needed.  30 tablet  5  . amLODipine (NORVASC) 10 MG tablet take 1 tablet by mouth once daily  90 tablet  3  . celecoxib (CELEBREX) 200 MG capsule Take 200 mg by mouth daily.      . diclofenac sodium (VOLTAREN) 1 % GEL Apply 1 application topically 4 (four) times daily as needed. For neck pain.  100 g  11  . enoxaparin (LOVENOX) 150 MG/ML injection Inject 0.77 mLs  (115 mg total) into the skin every 12 (twelve) hours.  20 Syringe  2  . MOVIPREP 100 G SOLR moviprep as directed. No substitutions  1 kit  0  . oxyCODONE (ROXICODONE) 15 MG immediate release tablet Take one tablet by mouth every 6 hours when needed for pain.  120 tablet  0  . pantoprazole (PROTONIX) 40 MG tablet Take 1 tablet (40 mg total) by mouth daily.  30 tablet  6  . SPIRIVA HANDIHALER 18 MCG inhalation capsule inhale contents of 1 capsule by mouth once daily  30 capsule  2  . tiotropium (SPIRIVA) 18 MCG inhalation capsule Place 18 mcg into inhaler and inhale daily.        Marland Kitchen warfarin (COUMADIN) 5 MG tablet Take 1 tablet (5 mg total) by mouth daily.  30 tablet  1    SURGICAL HISTORY:  Past Surgical History  Procedure Date  . Thrombectomy / embolectomy femoral artery     DVT  . Nasal sinus surgery   . Tubal ligation   . Rotator cuff repair     right  . Tubal ligation reversal   . Colonoscopy 04/27/2012    Procedure: COLONOSCOPY;  Surgeon: Beverley Fiedler, MD;  Location: [facility];  Service: Gastroenterology;  Laterality: N/A;    REVIEW OF SYSTEMS:   General: fatigue (-), night sweats (-), fever (-), pain (-) Lymph: palpable  nodes (-) HEENT: vision changes (-), mucositis (-), gum bleeding (-), epistaxis (-) Cardiovascular: chest pain (-), palpitations (-) Pulmonary: shortness of breath (-), dyspnea on exertion (-), cough (-), hemoptysis (-) GI:  Early satiety (-), melena (-), dysphagia (-), nausea/vomiting (-), diarrhea (-) GU: dysuria (-), hematuria (-), incontinence (-) Musculoskeletal: joint swelling (-), joint pain (-), back pain (-) Neuro: weakness (-), numbness (-), headache (-), confusion (-) Skin: Rash (-), lesions (-), dryness (-) Psych: depression (-), suicidal/homicidal ideation (-), feeling of hopelessness (-)   PHYSICAL EXAMINATION: Blood pressure 134/80, pulse 79, temperature 98.2 F (36.8 C), temperature source Oral, resp. rate 20, height 5\' 3"  (1.6 m), weight  169 lb 11.2 oz (76.975 kg). Body mass index is 30.06 kg/(m^2). General: Patient is a well appearing female in no acute distress HEENT: PERRLA, sclerae anicteric no conjunctival pallor, MMM Neck: supple, no palpable adenopathy Lungs: clear to auscultation bilaterally, no wheezes, rhonchi, or rales Cardiovascular: regular rate rhythm, S1, S2, no murmurs, rubs or gallops Abdomen: Soft, non-tender, non-distended, normoactive bowel sounds, no HSM Extremities: warm and well perfused, no clubbing, cyanosis, or edema Skin: No rashes or lesions Neuro: Non-focal ECOG PERFORMANCE STATUS: 0 - Asymptomatic    LABORATORY DATA: Lab Results  Component Value Date   WBC 8.7 07/17/2012   HGB 14.4 07/17/2012   HCT 41.8 07/17/2012   MCV 92.6 07/17/2012   PLT 269 07/17/2012      Chemistry      Component Value Date/Time   NA 140 07/17/2012 1350   NA 137 06/20/2012 1446   K 4.0 07/17/2012 1350   K 3.3* 06/20/2012 1446   CL 104 07/17/2012 1350   CL 99 06/20/2012 1446   CO2 29 07/17/2012 1350   CO2 26 06/20/2012 1446   BUN 7.0 07/17/2012 1350   BUN 7 06/20/2012 1446   CREATININE 0.8 07/17/2012 1350   CREATININE 0.7 06/20/2012 1446      Component Value Date/Time   CALCIUM 9.7 07/17/2012 1350   CALCIUM 9.1 06/20/2012 1446   ALKPHOS 109 07/17/2012 1350   ALKPHOS 84 03/31/2012 0620   AST 49* 07/17/2012 1350   AST 18 03/31/2012 0620   ALT 27 07/17/2012 1350   ALT 16 03/31/2012 0620   BILITOT 0.62 07/17/2012 1350   BILITOT 0.5 03/31/2012 0620       RADIOGRAPHIC STUDIES:  No results found.  ASSESSMENT and PLAN: 56 y/o with protein C and S deficiency on chronic anticoagulation due to multiple episodes of thrombosis.  She now has ischemic colitis.  Pt to proceed with colonoscopy.  She has stopped her coumadin and her INR has decreased accordingly.  She will start her Lovenox tonight.  Her colonoscopy is scheduled on 11/20.  Her last dose of Lovenox will be on 11/18.  We will schedule her for coags on 11/19 should Dr.  Rhea Belton want them.  I will see her back on 11/21 to restart her Lovenox and Coumadin.  All questions were answered. The patient knows to call the clinic with any problems, questions or concerns. We can certainly see the patient much sooner if necessary.  I spent 15 minutes counseling the patient face to face. The total time spent in the appointment was 30 minutes.  This case was reviewed with Dr. Rutherford Limerick C. Lyn Hollingshead, NP Medical Oncology [facility] Phone: [phone] 07/27/2012, 1:50 PM

## 2012-07-28 NOTE — Telephone Encounter (Signed)
If she has already stopped warfarin and will stop lovenox the day before the procedure, she does NOT need pre-procedure labs

## 2012-07-28 NOTE — Telephone Encounter (Signed)
Spoke with pt and we went over the lovenox dosing again and Dr Rhea Belton decided she does not need labs on 08/01/12. Pt stated understanding.

## 2012-08-01 NOTE — Progress Notes (Signed)
OFFICE PROGRESS NOTE  CC  MASSENBURG,O'LAF, PA [address] [place] [address]  DIAGNOSIS: 56 year old female with protein C and protein S deficiency with personal history of thrombosis.  PRIOR THERAPY: Chronic anticoagulation with Coumadin being monitored by her primary care physician.  CURRENT THERAPY: Patient will go on to Lovenox prior to her colonoscopy on 08/02/2012  INTERVAL HISTORY: Kiandria Clum 56 y.o. female returns for followup visit to discuss how to wean her off of the Coumadin and begin Lovenox. Clinically she is doing well. This conversion is for her upcoming colonoscopy. She has no bleeding problems no other complaints.  MEDICAL HISTORY: Past Medical History  Diagnosis Date  . Tobacco use disorder   . Unspecified urinary incontinence   . Complete rupture of rotator cuff   . Acute venous embolism and thrombosis of unspecified deep vessels of lower extremity   . Calculus of kidney   . Degeneration of intervertebral disc, site unspecified   . Painful respiration   . Other and unspecified noninfectious gastroenteritis and colitis   . HTN (hypertension)   . Family history of ischemic heart disease   . Anxiety state, unspecified   . HLD (hyperlipidemia)   . Primary hypercoagulable state   . Arthritis   . COPD (chronic obstructive pulmonary disease)   . Depression   . History of gallstones   . Protein S deficiency   . Colitis   . Renal failure   . Blood dyscrasia     protein def.    ALLERGIES:  is allergic to butalbital-apap-caffeine; naproxen; and tylenol.  MEDICATIONS:  Current Outpatient Prescriptions  Medication Sig Dispense Refill  . alprazolam (XANAX) 2 MG tablet Take 1 tablet (2 mg total) by mouth 3 (three) times daily as needed.  30 tablet  5  . amLODipine (NORVASC) 10 MG tablet take 1 tablet by mouth once daily  90 tablet  3  . celecoxib (CELEBREX) 200 MG capsule Take 200 mg by mouth daily.      . diclofenac sodium (VOLTAREN) 1 % GEL  Apply 1 application topically 4 (four) times daily as needed. For neck pain.  100 g  11  . MOVIPREP 100 G SOLR moviprep as directed. No substitutions  1 kit  0  . oxyCODONE (ROXICODONE) 15 MG immediate release tablet Take one tablet by mouth every 6 hours when needed for pain.  120 tablet  0  . pantoprazole (PROTONIX) 40 MG tablet Take 1 tablet (40 mg total) by mouth daily.  30 tablet  6  . SPIRIVA HANDIHALER 18 MCG inhalation capsule inhale contents of 1 capsule by mouth once daily  30 capsule  2  . warfarin (COUMADIN) 5 MG tablet Take 1 tablet (5 mg total) by mouth daily.  30 tablet  1  . enoxaparin (LOVENOX) 150 MG/ML injection Inject 0.77 mLs (115 mg total) into the skin every 12 (twelve) hours.  20 Syringe  2  . tiotropium (SPIRIVA) 18 MCG inhalation capsule Place 18 mcg into inhaler and inhale daily.          SURGICAL HISTORY:  Past Surgical History  Procedure Date  . Thrombectomy / embolectomy femoral artery     DVT  . Nasal sinus surgery   . Tubal ligation   . Rotator cuff repair     right  . Tubal ligation reversal   . Colonoscopy 04/27/2012    Procedure: COLONOSCOPY;  Surgeon: Beverley Fiedler, MD;  Location: [facility];  Service: Gastroenterology;  Laterality: N/A;  REVIEW OF SYSTEMS:  Pertinent items are noted in HPI.   HEALTH MAINTENANCE: PHYSICAL EXAMINATION: Blood pressure 146/79, pulse 87, temperature 98 F (36.7 C), resp. rate 20, height 5\' 3"  (1.6 m), weight 169 lb 11.2 oz (76.975 kg). Body mass index is 30.06 kg/(m^2). ECOG PERFORMANCE STATUS: 1 - Symptomatic but completely ambulatory   Awake alert in no acute distress HEENT exam EOMI PERRLA sclerae anicteric no conjunctival pallor oral mucosa is moist lungs clear cardiovascular regular rate rhythm abdomen soft nontender no HSM extremities no edema neuro nonfocal   LABORATORY DATA: Lab Results  Component Value Date   WBC 8.7 07/17/2012   HGB 14.4 07/17/2012   HCT 41.8 07/17/2012   MCV 92.6 07/17/2012   PLT 269  07/17/2012      Chemistry      Component Value Date/Time   NA 140 07/17/2012 1350   NA 137 06/20/2012 1446   K 4.0 07/17/2012 1350   K 3.3* 06/20/2012 1446   CL 104 07/17/2012 1350   CL 99 06/20/2012 1446   CO2 29 07/17/2012 1350   CO2 26 06/20/2012 1446   BUN 7.0 07/17/2012 1350   BUN 7 06/20/2012 1446   CREATININE 0.8 07/17/2012 1350   CREATININE 0.7 06/20/2012 1446      Component Value Date/Time   CALCIUM 9.7 07/17/2012 1350   CALCIUM 9.1 06/20/2012 1446   ALKPHOS 109 07/17/2012 1350   ALKPHOS 84 03/31/2012 0620   AST 49* 07/17/2012 1350   AST 18 03/31/2012 0620   ALT 27 07/17/2012 1350   ALT 16 03/31/2012 0620   BILITOT 0.62 07/17/2012 1350   BILITOT 0.5 03/31/2012 0620       RADIOGRAPHIC STUDIES:  No results found.  ASSESSMENT: 56 year old female with history of hypercoagulability with protein C and protein S deficiency and history of thrombosis on chronic anticoagulation. Patient is to undergo colonoscopy for evaluation of ischemic colitis and eventually resection of her colon. We are asked to manage her anticoagulation. I have very thoroughly explained to the patient how to wean herself off of Coumadin and get started on Lovenox. Instructions are as below  PLAN:   11/12 - 11/13 no coumadin  11/14: we will check Pt/INR and you will see NP Mardella Layman) and we will decide about putting you on lovenox   11/15 - 11/18 lovenox injections (115 mg injection every 12 hours)  11/19 no lovenox  11/20 no lovenox proceed with your colonoscopy  11/21 see NP Mardella Layman and then we will decide about putting you back on Lovenox and coumadin    All questions were answered. The patient knows to call the clinic with any problems, questions or concerns. We can certainly see the patient much sooner if necessary.  I spent 25 minutes counseling the patient face to face. The total time spent in the appointment was 30 minutes.    Drue Second, MD Medical/Oncology [facility] [phone] (beeper) [phone] (Office)  08/01/2012, 2:59 PM

## 2012-08-02 ENCOUNTER — Ambulatory Visit (AMBULATORY_SURGERY_CENTER): Payer: Medicaid Other | Admitting: Internal Medicine

## 2012-08-02 ENCOUNTER — Encounter: Payer: Self-pay | Admitting: Internal Medicine

## 2012-08-02 VITALS — BP 131/56 | HR 69 | Temp 98.4°F | Resp 15 | Ht 63.0 in | Wt 171.0 lb

## 2012-08-02 DIAGNOSIS — K559 Vascular disorder of intestine, unspecified: Secondary | ICD-10-CM

## 2012-08-02 DIAGNOSIS — R109 Unspecified abdominal pain: Secondary | ICD-10-CM

## 2012-08-02 DIAGNOSIS — D126 Benign neoplasm of colon, unspecified: Secondary | ICD-10-CM

## 2012-08-02 DIAGNOSIS — K633 Ulcer of intestine: Secondary | ICD-10-CM

## 2012-08-02 DIAGNOSIS — Z7901 Long term (current) use of anticoagulants: Secondary | ICD-10-CM

## 2012-08-02 DIAGNOSIS — K551 Chronic vascular disorders of intestine: Secondary | ICD-10-CM

## 2012-08-02 MED ORDER — SODIUM CHLORIDE 0.9 % IV SOLN: 500.0000 mL | INTRAVENOUS | Status: DC

## 2012-08-02 MED ORDER — OXYCODONE HCL 15 MG PO TABS: ORAL_TABLET | ORAL | Status: DC

## 2012-08-02 NOTE — Progress Notes (Signed)
The pt tolerated the colonoscopy very well. Maw   

## 2012-08-02 NOTE — Progress Notes (Signed)
Abdominal pressure by the tech to aid scope advancement.  Maw

## 2012-08-02 NOTE — Op Note (Signed)
[facility] [address] [place], [address]   COLONOSCOPY PROCEDURE REPORT  PATIENT: Dejanique, Ruehl  MR#: [id number] BIRTHDATE: 09-27-1955 , 55  yrs. old GENDER: Female ENDOSCOPIST: Beverley Fiedler, MD REFERRED BY: PROCEDURE DATE:  08/02/2012 PROCEDURE:   Colonoscopy with snare polypectomy, Colonoscopy with biopsy, and Colonoscopy with cold biopsy polypectomy ASA CLASS:   Class III INDICATIONS:follow up for previously diagnosed ischemic colitis, Last colonoscopy performed August 2013, and Abdominal pain. MEDICATIONS: MAC sedation, administered by CRNA and propofol (Diprivan) 300mg  IV  DESCRIPTION OF PROCEDURE:   After the risks benefits and alternatives of the procedure were thoroughly explained, informed consent was obtained.  A digital rectal exam revealed no rectal mass.   The LB [id number]-[id number]  endoscope was introduced through the anus and advanced to the cecum, which was identified by both the appendix and ileocecal valve. No adverse events experienced. The quality of the prep was Moviprep fair  The instrument was then slowly withdrawn as the colon was fully examined.      COLON FINDINGS: Three sessile polyps ranging between 3-23mm in size were found in the ascending colon, transverse colon, and at the splenic flexure.  Polypectomy was performed with cold forceps and using cold snare.  All resections were complete and all polyp tissue was completely retrieved.  Possible abnormal mucosa was found at the hepatic flexure.  The mucosa was mildly nodular. Multiple biopsies of the area were performed using cold forceps. An  area of nearly circumferential ulceration and erythema was found in the sigmoid colon at 30 cm from the dentate line.  This area was nonobstructing and short segment (2-3 cm), but slightly narrowed and suspicion for non-healing ischemic colitis given her history.  Multiple biopsies of the area were performed using cold forceps.   Mild  diverticulosis was noted in the sigmoid colon. Retroflexed views revealed no abnormalities. The time to cecum=6 minutes 08 seconds.  Withdrawal time=18 minutes 32 seconds.  The scope was withdrawn and the procedure completed.  COMPLICATIONS: There were no complications.  ENDOSCOPIC IMPRESSION: 1.   Three sessile polyps ranging between 3-64mm in size were found in the ascending colon, transverse colon, and at the splenic flexure; Polypectomy was performed with cold forceps and using cold snare 2.   Slightly nodular mucosa was found at the hepatic flexure; multiple biopsies of the area were performed using cold forceps 3.   Ulceration in the sigmoid colon at 30 cm; multiple biopsies of the area were performed using cold forceps 4.   Mild diverticulosis was noted in the sigmoid colon  RECOMMENDATIONS: 1.  Await pathology results, further recommendations thereafter 2.  Avoid NSAIDS 3.  Low residue-fiber diet 4.  Can resume Lovenox with tomorrow morning's dose   eSigned:  Beverley Fiedler, MD 08/02/2012 10:36 AM   cc: The Patient, Ginger Carne MD, and Drue Second, MD   PATIENT NAME:  Aloria, Looper MR#: [id number]

## 2012-08-02 NOTE — Progress Notes (Signed)
Patient did not experience any of the following events: a burn prior to discharge; a fall within the facility; wrong site/side/patient/procedure/implant event; or a hospital transfer or hospital admission upon discharge from the facility. (G8907) Patient did not have preoperative order for IV antibiotic SSI prophylaxis. (G8918)  

## 2012-08-02 NOTE — Progress Notes (Signed)
Propofol given over incremental dosages 

## 2012-08-02 NOTE — Progress Notes (Signed)
Per the pt presedation her last dose of coumadin was 07-27-12 and the last dose of lovenox was 08-01-12 am.  Dr. Rhea Belton was made aware. Maw

## 2012-08-02 NOTE — Patient Instructions (Addendum)

## 2012-08-03 ENCOUNTER — Telehealth: Payer: Self-pay | Admitting: Internal Medicine

## 2012-08-03 ENCOUNTER — Ambulatory Visit (HOSPITAL_BASED_OUTPATIENT_CLINIC_OR_DEPARTMENT_OTHER): Payer: Medicaid Other | Admitting: Adult Health

## 2012-08-03 ENCOUNTER — Telehealth: Payer: Self-pay | Admitting: *Deleted

## 2012-08-03 ENCOUNTER — Encounter: Payer: Self-pay | Admitting: Adult Health

## 2012-08-03 ENCOUNTER — Other Ambulatory Visit (HOSPITAL_BASED_OUTPATIENT_CLINIC_OR_DEPARTMENT_OTHER): Payer: Medicaid Other | Admitting: Lab

## 2012-08-03 VITALS — BP 146/77 | HR 80 | Temp 98.2°F | Resp 20 | Ht 63.0 in | Wt 170.4 lb

## 2012-08-03 DIAGNOSIS — K559 Vascular disorder of intestine, unspecified: Secondary | ICD-10-CM

## 2012-08-03 DIAGNOSIS — I82403 Acute embolism and thrombosis of unspecified deep veins of lower extremity, bilateral: Secondary | ICD-10-CM

## 2012-08-03 DIAGNOSIS — I749 Embolism and thrombosis of unspecified artery: Secondary | ICD-10-CM

## 2012-08-03 DIAGNOSIS — Z7901 Long term (current) use of anticoagulants: Secondary | ICD-10-CM

## 2012-08-03 DIAGNOSIS — D6859 Other primary thrombophilia: Secondary | ICD-10-CM

## 2012-08-03 LAB — CBC WITH DIFFERENTIAL/PLATELET
BASO%: 0.2 % (ref 0.0–2.0)
Basophils Absolute: 0 10*3/uL (ref 0.0–0.1)
HCT: 40.6 % (ref 34.8–46.6)
HGB: 13.9 g/dL (ref 11.6–15.9)
MCHC: 34.2 g/dL (ref 31.5–36.0)
MONO#: 0.5 10*3/uL (ref 0.1–0.9)
NEUT%: 58.8 % (ref 38.4–76.8)
RDW: 13.1 % (ref 11.2–14.5)
WBC: 8.7 10*3/uL (ref 3.9–10.3)
lymph#: 2.8 10*3/uL (ref 0.9–3.3)

## 2012-08-03 LAB — PROTIME-INR
INR: 1 — ABNORMAL LOW (ref 2.00–3.50)
Protime: 12 Seconds (ref 10.6–13.4)

## 2012-08-03 NOTE — Progress Notes (Signed)
OFFICE PROGRESS NOTE  CC  MASSENBURG,O'LAF, PA [address] [place] [address]  DIAGNOSIS: Protein C and S deficiency, multiple thrombosis  PRIOR THERAPY: Chronic Coumadin  CURRENT THERAPY: Lovenox  INTERVAL HISTORY: Brooke Garcia 56 y.o. female returns for follow up. She had her colonoscopy yesterday where the gastroenterologist saw two separate areas of ulceration and removed 3 polyps, all areas were biopsied and path is pending.  Surgery was recommended sooner rather than later, but she will not know for sure until Monday.    MEDICAL HISTORY: Past Medical History  Diagnosis Date  . Tobacco use disorder   . Unspecified urinary incontinence   . Complete rupture of rotator cuff   . Acute venous embolism and thrombosis of unspecified deep vessels of lower extremity   . Calculus of kidney   . Degeneration of intervertebral disc, site unspecified   . Painful respiration   . Other and unspecified noninfectious gastroenteritis and colitis   . HTN (hypertension)   . Family history of ischemic heart disease   . Anxiety state, unspecified   . HLD (hyperlipidemia)   . Primary hypercoagulable state   . Arthritis   . COPD (chronic obstructive pulmonary disease)   . Depression   . History of gallstones   . Protein S deficiency   . Colitis   . Renal failure   . Blood dyscrasia     protein def.    ALLERGIES:  is allergic to butalbital-apap-caffeine; naproxen; and tylenol.  MEDICATIONS:  Current Outpatient Prescriptions  Medication Sig Dispense Refill  . alprazolam (XANAX) 2 MG tablet Take 1 tablet (2 mg total) by mouth 3 (three) times daily as needed.  30 tablet  5  . amLODipine (NORVASC) 10 MG tablet take 1 tablet by mouth once daily  90 tablet  3  . celecoxib (CELEBREX) 200 MG capsule Take 200 mg by mouth daily.      . diclofenac sodium (VOLTAREN) 1 % GEL Apply 1 application topically 4 (four) times daily as needed. For neck pain.  100 g  11  . enoxaparin (LOVENOX)  150 MG/ML injection Inject 0.77 mLs (115 mg total) into the skin every 12 (twelve) hours.  20 Syringe  2  . oxyCODONE (ROXICODONE) 15 MG immediate release tablet Take one tablet by mouth every 6 hours when needed for pain.  28 tablet  0  . pantoprazole (PROTONIX) 40 MG tablet Take 1 tablet (40 mg total) by mouth daily.  30 tablet  6  . SPIRIVA HANDIHALER 18 MCG inhalation capsule inhale contents of 1 capsule by mouth once daily  30 capsule  2  . tiotropium (SPIRIVA) 18 MCG inhalation capsule Place 18 mcg into inhaler and inhale daily.        Marland Kitchen warfarin (COUMADIN) 5 MG tablet Take 1 tablet (5 mg total) by mouth daily.  30 tablet  1   No current facility-administered medications for this visit.   Facility-Administered Medications Ordered in Other Visits  Medication Dose Route Frequency Provider Last Rate Last Dose  . [DISCONTINUED] 0.9 %  sodium chloride infusion  500 mL Intravenous Continuous Beverley Fiedler, MD        SURGICAL HISTORY:  Past Surgical History  Procedure Date  . Thrombectomy / embolectomy femoral artery     DVT  . Nasal sinus surgery   . Tubal ligation   . Rotator cuff repair     right  . Tubal ligation reversal   . Colonoscopy 04/27/2012    Procedure: COLONOSCOPY;  Surgeon: Beverley Fiedler, MD;  Location: [facility];  Service: Gastroenterology;  Laterality: N/A;    REVIEW OF SYSTEMS:   General: fatigue (-), night sweats (-), fever (-), pain (-) Lymph: palpable nodes (-) HEENT: vision changes (-), mucositis (-), gum bleeding (-), epistaxis (-) Cardiovascular: chest pain (-), palpitations (-) Pulmonary: shortness of breath (-), dyspnea on exertion (-), cough (-), hemoptysis (-) GI:  Early satiety (-), melena (-), dysphagia (-), nausea/vomiting (-), diarrhea (-) GU: dysuria (-), hematuria (-), incontinence (-) Musculoskeletal: joint swelling (-), joint pain (-), back pain (-) Neuro: weakness (-), numbness (-), headache (-), confusion (-) Skin: Rash (-), lesions (-),  dryness (-) Psych: depression (-), suicidal/homicidal ideation (-), feeling of hopelessness (-)   PHYSICAL EXAMINATION: Blood pressure 146/77, pulse 80, temperature 98.2 F (36.8 C), temperature source Oral, resp. rate 20, height 5\' 3"  (1.6 m), weight 170 lb 6.4 oz (77.293 kg). Body mass index is 30.19 kg/(m^2). General: Patient is a well appearing female in no acute distress HEENT: PERRLA, sclerae anicteric no conjunctival pallor, MMM Neck: supple, no palpable adenopathy Lungs: clear to auscultation bilaterally, no wheezes, rhonchi, or rales Cardiovascular: regular rate rhythm, S1, S2, no murmurs, rubs or gallops Abdomen: Soft, non-tender, non-distended, normoactive bowel sounds, no HSM Extremities: warm and well perfused, no clubbing, cyanosis, or edema Skin: No rashes or lesions Neuro: Non-focal ECOG PERFORMANCE STATUS: 0 - Asymptomatic    LABORATORY DATA: Lab Results  Component Value Date   WBC 8.7 08/03/2012   HGB 13.9 08/03/2012   HCT 40.6 08/03/2012   MCV 92.3 08/03/2012   PLT 190 08/03/2012      Chemistry      Component Value Date/Time   NA 140 07/17/2012 1350   NA 137 06/20/2012 1446   K 4.0 07/17/2012 1350   K 3.3* 06/20/2012 1446   CL 104 07/17/2012 1350   CL 99 06/20/2012 1446   CO2 29 07/17/2012 1350   CO2 26 06/20/2012 1446   BUN 7.0 07/17/2012 1350   BUN 7 06/20/2012 1446   CREATININE 0.8 07/17/2012 1350   CREATININE 0.7 06/20/2012 1446      Component Value Date/Time   CALCIUM 9.7 07/17/2012 1350   CALCIUM 9.1 06/20/2012 1446   ALKPHOS 109 07/17/2012 1350   ALKPHOS 84 03/31/2012 0620   AST 49* 07/17/2012 1350   AST 18 03/31/2012 0620   ALT 27 07/17/2012 1350   ALT 16 03/31/2012 0620   BILITOT 0.62 07/17/2012 1350   BILITOT 0.5 03/31/2012 0620       RADIOGRAPHIC STUDIES:  No results found.  ASSESSMENT and PLAN: 56 y/o with protein C and S deficiency on chronic anticoagulation due to multiple episodes of thrombosis.  She now has ischemic colitis. She underwent  her colonoscopy, and may need colorectal surgery soon.  Therefore, we will restart her Lovenox only today and make decisions about follow up and a restart date of coumadin on Monday once we get a surgery date.    All questions were answered. The patient knows to call the clinic with any problems, questions or concerns. We can certainly see the patient much sooner if necessary.  I spent 15 minutes counseling the patient face to face. The total time spent in the appointment was 30 minutes.  This case was reviewed with Dr. Rutherford Limerick C. Lyn Hollingshead, NP Medical Oncology [facility] Phone: [phone] 08/03/2012, 2:38 PM

## 2012-08-03 NOTE — Patient Instructions (Addendum)
Doing well.  Resume Lovenox and do not start your Coumadin back.  Call us on Monday with your surgery date and we will schedule a follow up appointment based on your surgery date.  Marland Kitchen

## 2012-08-03 NOTE — Telephone Encounter (Signed)
No answer left message to call office if questions or concerns. 

## 2012-08-03 NOTE — Telephone Encounter (Signed)
Reviewed hematology note from today.  Dr. Rhea Belton notified that they are keeping her on lovenox pending a surgery date for colon resection.  Discussed with the patient that Dr. Rhea Belton does not want to make the surgical referral until the bx results are available.  The patient thought that Dr. Rhea Belton is a surgeon and would be performing the resection.  I have explained to her that Dr. Rhea Belton thinks that she needs to start back on coumadin and not wait until next week, because it will take a while to arrange surgical referral and then surgery.  The patient is aware she will be contacted once the bx results are available and he has a chance to advise the next step.  She verbalized understanding.

## 2012-08-04 ENCOUNTER — Telehealth: Payer: Self-pay | Admitting: Medical Oncology

## 2012-08-04 NOTE — Telephone Encounter (Signed)
Patient called asking when to start taking her coumadin since her surgery is pending biopsy results. Reviewed with NP, notified pt to stay on current Lovenox 115 mg / 0.14ml dose until Biopsy results received. Once biospy results reviewed by provider and surgery date is established, pt will be contacted regarding changes to Lovenox. Pt verbalized understanding. No further questions.

## 2012-08-07 ENCOUNTER — Encounter (HOSPITAL_COMMUNITY): Payer: Self-pay | Admitting: Emergency Medicine

## 2012-08-07 ENCOUNTER — Emergency Department (HOSPITAL_COMMUNITY)
Admission: EM | Admit: 2012-08-07 | Discharge: 2012-08-07 | Disposition: A | Discharge status: Home / Self Care | Payer: Medicaid Other | Attending: Emergency Medicine | Admitting: Emergency Medicine

## 2012-08-07 ENCOUNTER — Telehealth: Payer: Self-pay | Admitting: *Deleted

## 2012-08-07 DIAGNOSIS — Z87442 Personal history of urinary calculi: Secondary | ICD-10-CM | POA: Insufficient documentation

## 2012-08-07 DIAGNOSIS — Z8639 Personal history of other endocrine, nutritional and metabolic disease: Secondary | ICD-10-CM | POA: Insufficient documentation

## 2012-08-07 DIAGNOSIS — F411 Generalized anxiety disorder: Secondary | ICD-10-CM | POA: Insufficient documentation

## 2012-08-07 DIAGNOSIS — Z87828 Personal history of other (healed) physical injury and trauma: Secondary | ICD-10-CM | POA: Insufficient documentation

## 2012-08-07 DIAGNOSIS — IMO0002 Reserved for concepts with insufficient information to code with codable children: Secondary | ICD-10-CM | POA: Insufficient documentation

## 2012-08-07 DIAGNOSIS — E785 Hyperlipidemia, unspecified: Secondary | ICD-10-CM | POA: Insufficient documentation

## 2012-08-07 DIAGNOSIS — Z8739 Personal history of other diseases of the musculoskeletal system and connective tissue: Secondary | ICD-10-CM | POA: Insufficient documentation

## 2012-08-07 DIAGNOSIS — F329 Major depressive disorder, single episode, unspecified: Secondary | ICD-10-CM | POA: Insufficient documentation

## 2012-08-07 DIAGNOSIS — F3289 Other specified depressive episodes: Secondary | ICD-10-CM | POA: Insufficient documentation

## 2012-08-07 DIAGNOSIS — Z8719 Personal history of other diseases of the digestive system: Secondary | ICD-10-CM | POA: Insufficient documentation

## 2012-08-07 DIAGNOSIS — Z862 Personal history of diseases of the blood and blood-forming organs and certain disorders involving the immune mechanism: Secondary | ICD-10-CM | POA: Insufficient documentation

## 2012-08-07 DIAGNOSIS — Z87448 Personal history of other diseases of urinary system: Secondary | ICD-10-CM | POA: Insufficient documentation

## 2012-08-07 DIAGNOSIS — Z79899 Other long term (current) drug therapy: Secondary | ICD-10-CM | POA: Insufficient documentation

## 2012-08-07 DIAGNOSIS — Z7901 Long term (current) use of anticoagulants: Secondary | ICD-10-CM | POA: Insufficient documentation

## 2012-08-07 DIAGNOSIS — F17200 Nicotine dependence, unspecified, uncomplicated: Secondary | ICD-10-CM | POA: Insufficient documentation

## 2012-08-07 DIAGNOSIS — I10 Essential (primary) hypertension: Secondary | ICD-10-CM | POA: Insufficient documentation

## 2012-08-07 DIAGNOSIS — I82409 Acute embolism and thrombosis of unspecified deep veins of unspecified lower extremity: Secondary | ICD-10-CM | POA: Insufficient documentation

## 2012-08-07 DIAGNOSIS — J4489 Other specified chronic obstructive pulmonary disease: Secondary | ICD-10-CM | POA: Insufficient documentation

## 2012-08-07 DIAGNOSIS — J449 Chronic obstructive pulmonary disease, unspecified: Secondary | ICD-10-CM | POA: Insufficient documentation

## 2012-08-07 DIAGNOSIS — R040 Epistaxis: Secondary | ICD-10-CM | POA: Insufficient documentation

## 2012-08-07 MED ORDER — OXYCODONE HCL 5 MG PO TABS: 15.0000 mg | ORAL_TABLET | ORAL | Status: DC | PRN

## 2012-08-07 MED ORDER — OXYMETAZOLINE HCL 0.05 % NA SOLN: 1.0000 | Freq: Once | NASAL | Status: DC

## 2012-08-07 MED ORDER — OXYMETAZOLINE HCL 0.05 % NA SOLN
NASAL | Status: AC
  Administered 2012-08-07: 17:00:00
  Filled 2012-08-07: qty 15

## 2012-08-07 NOTE — Telephone Encounter (Signed)
PT. SOUNDS SHORT OF BREATH AND IS COUGHING.YESTERDAY PT. HAD A NOSEBLEED WHICH LASTED 15 MINUTES. THIS MORNING PT. HAD A NOSEBLEED WHICH LASTED 15 MINUTES. THIS AFTERNOON SHE HAS HAD A NOSEBLEED FOR AN HOUR AND A HALF. PT.'S HUSBAND CAME TO THE PHONE WHEN PT. COUGHED UP A CLOT. INSTRUCTED PT.'S HUSBAND TO TAKE HIS WIFE TO THE EMERGENCY ROOM NOW. HE VOICES UNDERSTANDING. NOTIFIED DR.KHAN'S NURSE, MEREDITH WALTON,RN.

## 2012-08-07 NOTE — ED Notes (Signed)
Per EMS report and Pt report, pt started having a nose bleed yesterday around 1100. Pt reports to "being able to get it to stop." Pt currently reports to having Protein S deficient and is currently taking Lovenox.

## 2012-08-07 NOTE — ED Provider Notes (Signed)
History     CSN: [id number]  Arrival date & time 08/07/12  1657   First MD Initiated Contact with Patient 08/07/12 1706      Chief Complaint  Patient presents with  . Epistaxis    (Consider location/radiation/quality/duration/timing/severity/associated sxs/prior treatment) The history is provided by the patient.  Brooke Garcia is a 56 y.o. female hx of protein C deficiency, DVT on lovenox, here with nose bleed. R nose bleed since yesterday after she blew her nose. Lasted 15 min yesterday. This AM, it was bleeding for about 2 hrs without improvement. She tried pinching her nose but didn't help. She swallowed some blood and coughed it up. Denies dizziness, chest pain or shortness of breath. She had colonoscopy recently so she was off coumadin and just on lovenox.    Past Medical History  Diagnosis Date  . Tobacco use disorder   . Unspecified urinary incontinence   . Complete rupture of rotator cuff   . Acute venous embolism and thrombosis of unspecified deep vessels of lower extremity   . Calculus of kidney   . Degeneration of intervertebral disc, site unspecified   . Painful respiration   . Other and unspecified noninfectious gastroenteritis and colitis   . HTN (hypertension)   . Family history of ischemic heart disease   . Anxiety state, unspecified   . HLD (hyperlipidemia)   . Primary hypercoagulable state   . Arthritis   . COPD (chronic obstructive pulmonary disease)   . Depression   . History of gallstones   . Protein S deficiency   . Colitis   . Renal failure   . Blood dyscrasia     protein def.    Past Surgical History  Procedure Date  . Thrombectomy / embolectomy femoral artery     DVT  . Nasal sinus surgery   . Tubal ligation   . Rotator cuff repair     right  . Tubal ligation reversal   . Colonoscopy 04/27/2012    Procedure: COLONOSCOPY;  Surgeon: Beverley Fiedler, MD;  Location: [facility];  Service: Gastroenterology;  Laterality: N/A;    Family History    Problem Relation Age of Onset  . Breast cancer Mother   . Heart attack Father   . Lung cancer Father   . Other Father     Protein S Deficiency  . Clotting disorder Father   . Diabetes Father   . Hypertension Maternal Grandmother   . Colon cancer Maternal Grandmother   . Heart disease Maternal Grandmother   . Heart attack Paternal Grandmother   . Diabetes Sister   . Stomach cancer Neg Hx     History  Substance Use Topics  . Smoking status: Current Every Day Smoker -- 1.0 packs/day for 30 years    Types: Cigarettes  . Smokeless tobacco: Never Used     Comment: 0.5 packs per day  . Alcohol Use: Yes     Comment: Wine Occasionally    OB History    Grav Para Term Preterm Abortions TAB SAB Ect Mult Living                  Review of Systems  HENT: Positive for nosebleeds.   All other systems reviewed and are negative.    Allergies  Butalbital-apap-caffeine; Naproxen; and Tylenol  Home Medications   Current Outpatient Rx  Name  Route  Sig  Dispense  Refill  . ALPRAZOLAM 2 MG PO TABS   Oral   Take 1 tablet (  2 mg total) by mouth 3 (three) times daily as needed.   30 tablet   5   . AMLODIPINE BESYLATE 10 MG PO TABS      take 1 tablet by mouth once daily   90 tablet   3   . CELECOXIB 200 MG PO CAPS   Oral   Take 200 mg by mouth daily.         Marland Kitchen DICLOFENAC SODIUM 1 % TD GEL   Topical   Apply 1 application topically 4 (four) times daily as needed. For neck pain.   100 g   11   . ENOXAPARIN SODIUM 150 MG/ML [place] SOLN   Subcutaneous   Inject 0.77 mLs (115 mg total) into the skin every 12 (twelve) hours.   20 Syringe   2   . PANTOPRAZOLE SODIUM 40 MG PO TBEC   Oral   Take 1 tablet (40 mg total) by mouth daily.   30 tablet   6   . TIOTROPIUM BROMIDE MONOHYDRATE 18 MCG IN CAPS   Inhalation   Place 18 mcg into inhaler and inhale daily.           . OXYCODONE HCL 15 MG PO TABS      Take one tablet by mouth every 6 hours when needed for pain.   28  tablet   0   . OXYCODONE HCL 5 MG PO TABS   Oral   Take 3 tablets (15 mg total) by mouth every 4 (four) hours as needed for pain.   8 tablet   0     BP 130/85  Pulse 92  Temp 97.9 F (36.6 C) (Oral)  Resp 18  SpO2 96%  Physical Exam  Nursing note and vitals reviewed. Constitutional: She is oriented to person, place, and time.       Anxious, holding her nose   HENT:  Head: Normocephalic.       Some bleeding R nostril around kisselbach's triangle. No bleeding L nostril. OP with dry blood with no active bleeding posteriorly.   Eyes: Conjunctivae normal are normal. Pupils are equal, round, and reactive to light.  Neck: Normal range of motion. Neck supple.  Cardiovascular: Normal rate and intact distal pulses.   Pulmonary/Chest: Effort normal and breath sounds normal. No respiratory distress. She has no wheezes. She has no rales.  Abdominal: Soft. Bowel sounds are normal. She exhibits no distension. There is no tenderness.  Musculoskeletal: Normal range of motion. She exhibits no edema and no tenderness.  Neurological: She is alert and oriented to person, place, and time.  Skin: Skin is warm and dry.  Psychiatric: She has a normal mood and affect. Her behavior is normal. Judgment and thought content normal.    ED Course  EPISTAXIS MANAGEMENT Date/Time: 08/07/2012 5:34 PM Performed by: Richardean Canal Authorized by: Richardean Canal Consent: Verbal consent obtained. Written consent not obtained. Risks and benefits: risks, benefits and alternatives were discussed Consent given by: patient Patient understanding: patient states understanding of the procedure being performed Patient consent: the patient's understanding of the procedure matches consent given Procedure consent: procedure consent matches procedure scheduled Relevant documents: relevant documents present and verified Patient identity confirmed: verbally with patient and arm band Local anesthetic: co-phenylcaine  spray Anesthetic total: 1 ml Patient sedated: no Treatment site: right anterior Repair method: nasal balloon Post-procedure assessment: bleeding stopped Treatment complexity: simple Comments: I used size 5.5 rhinorocket    (including critical care time)    Labs  Reviewed - No data to display No results found.   1. Epistaxis       MDM  Brooke Garcia is a 56 y.o. female here with nose bleed. Rhinorocket placed in R nostril, no bleeding afterwards. Will d/c home with ENT f/u outpatient.          Richardean Canal, MD 08/07/12 [phone]

## 2012-08-09 ENCOUNTER — Encounter: Payer: Self-pay | Admitting: Internal Medicine

## 2012-08-09 ENCOUNTER — Telehealth: Payer: Self-pay | Admitting: Internal Medicine

## 2012-08-09 DIAGNOSIS — K559 Vascular disorder of intestine, unspecified: Secondary | ICD-10-CM

## 2012-08-09 NOTE — Telephone Encounter (Signed)
Spoke with pt who is tired of being in pain and having to deal with finding pain meds. She states Dr Pollyann Kennedy is now in [place] fulltime and he feels Dr Rhea Belton needs to handle her pain. We discussed this and her path and she would like a surgical referral for possible options and she agreed to [facility] referral if I can find a place that accepts Medicaid.  Pt is scheduled for 12/9 @ 2.15 pt needs to arrive at 1.45 for paperwork with Dr Darnell Level...thanks Informed pt of appt.

## 2012-08-09 NOTE — Telephone Encounter (Signed)
Dr Rhea Belton, please review the path so I can inform the pt; thanks.

## 2012-08-09 NOTE — Telephone Encounter (Signed)
The biopsies from the ulcerated area of the left colon showed chronic ischemic injury. It has been since July, and not fully healed. It certainly has healed some, but this one area remains ulcerated Options are surgical opinion regarding resection of this one spot in the left colon, versus waiting to see if this area heals fully and thus reduce her symptoms of left-sided abdominal pain

## 2012-08-14 ENCOUNTER — Ambulatory Visit (HOSPITAL_BASED_OUTPATIENT_CLINIC_OR_DEPARTMENT_OTHER): Payer: Medicaid Other | Admitting: Adult Health

## 2012-08-14 ENCOUNTER — Encounter: Payer: Self-pay | Admitting: Adult Health

## 2012-08-14 ENCOUNTER — Other Ambulatory Visit (HOSPITAL_BASED_OUTPATIENT_CLINIC_OR_DEPARTMENT_OTHER): Payer: Medicaid Other | Admitting: Lab

## 2012-08-14 VITALS — BP 149/78 | HR 84 | Temp 97.5°F | Resp 20 | Ht 63.0 in | Wt 167.9 lb

## 2012-08-14 DIAGNOSIS — Z86718 Personal history of other venous thrombosis and embolism: Secondary | ICD-10-CM

## 2012-08-14 DIAGNOSIS — D6859 Other primary thrombophilia: Secondary | ICD-10-CM

## 2012-08-14 DIAGNOSIS — I82403 Acute embolism and thrombosis of unspecified deep veins of lower extremity, bilateral: Secondary | ICD-10-CM

## 2012-08-14 DIAGNOSIS — Z5181 Encounter for therapeutic drug level monitoring: Secondary | ICD-10-CM

## 2012-08-14 DIAGNOSIS — Z7901 Long term (current) use of anticoagulants: Secondary | ICD-10-CM

## 2012-08-14 LAB — CBC WITH DIFFERENTIAL/PLATELET
Eosinophils Absolute: 0.3 10*3/uL (ref 0.0–0.5)
HCT: 34.9 % (ref 34.8–46.6)
LYMPH%: 32.9 % (ref 14.0–49.7)
MCHC: 33.5 g/dL (ref 31.5–36.0)
MCV: 92.1 fL (ref 79.5–101.0)
MONO#: 0.5 10*3/uL (ref 0.1–0.9)
MONO%: 6.8 % (ref 0.0–14.0)
NEUT#: 3.8 10*3/uL (ref 1.5–6.5)
NEUT%: 56.2 % (ref 38.4–76.8)
Platelets: 262 10*3/uL (ref 145–400)
RBC: 3.79 10*6/uL (ref 3.70–5.45)
WBC: 6.8 10*3/uL (ref 3.9–10.3)
nRBC: 0 % (ref 0–0)

## 2012-08-14 LAB — PROTIME-INR

## 2012-08-14 NOTE — Patient Instructions (Addendum)
Doing well.  Continue lovenox.  Call us on 12/9 with the surgery details.  Please call us if you have any questions or concerns.

## 2012-08-14 NOTE — Progress Notes (Signed)
OFFICE PROGRESS NOTE  [facility] [address] [place] [address]  DIAGNOSIS: Protein C and S deficiency, multiple thrombosis  PRIOR THERAPY: Chronic Coumadin  CURRENT THERAPY: Lovenox  INTERVAL HISTORY: Brooke Garcia 56 y.o. female returns for follow up. She met with Dr. Rhea Belton who has sent her to see Dr. Ileene Rubens.  She will have an appointment with him on 08/21/12.  She will not know until then if he needs to operate emergently or after the holidays.  She would like to hold off on restarting the Coumadin until after her appointment with him.  She did have an episode of severe epistaxis on 11/26 that sent her to the ER, and her nose was subsequently packed.  She has since improved, and has been using saline nasal spray, and has afrin should she need it.  She has not had any other episode of easy bruising or bleeding.  She is feeling well and w/o questions/concerns.    MEDICAL HISTORY: Past Medical History  Diagnosis Date  . Tobacco use disorder   . Unspecified urinary incontinence   . Complete rupture of rotator cuff   . Acute venous embolism and thrombosis of unspecified deep vessels of lower extremity   . Calculus of kidney   . Degeneration of intervertebral disc, site unspecified   . Painful respiration   . Other and unspecified noninfectious gastroenteritis and colitis   . HTN (hypertension)   . Family history of ischemic heart disease   . Anxiety state, unspecified   . HLD (hyperlipidemia)   . Primary hypercoagulable state   . Arthritis   . COPD (chronic obstructive pulmonary disease)   . Depression   . History of gallstones   . Protein S deficiency   . Colitis   . Renal failure   . Blood dyscrasia     protein def.    ALLERGIES:  is allergic to butalbital-apap-caffeine; naproxen; and tylenol.  MEDICATIONS:  Current Outpatient Prescriptions  Medication Sig Dispense Refill  . alprazolam (XANAX) 2 MG tablet Take 1 tablet (2 mg total) by  mouth 3 (three) times daily as needed.  30 tablet  5  . amLODipine (NORVASC) 10 MG tablet take 1 tablet by mouth once daily  90 tablet  3  . celecoxib (CELEBREX) 200 MG capsule Take 200 mg by mouth daily.      . diclofenac sodium (VOLTAREN) 1 % GEL Apply 1 application topically 4 (four) times daily as needed. For neck pain.  100 g  11  . enoxaparin (LOVENOX) 150 MG/ML injection Inject 0.77 mLs (115 mg total) into the skin every 12 (twelve) hours.  20 Syringe  2  . oxyCODONE (ROXICODONE) 15 MG immediate release tablet Take one tablet by mouth every 6 hours when needed for pain.  28 tablet  0  . oxyCODONE (ROXICODONE) 5 MG immediate release tablet Take 3 tablets (15 mg total) by mouth every 4 (four) hours as needed for pain.  8 tablet  0  . pantoprazole (PROTONIX) 40 MG tablet Take 1 tablet (40 mg total) by mouth daily.  30 tablet  6  . tiotropium (SPIRIVA) 18 MCG inhalation capsule Place 18 mcg into inhaler and inhale daily.        Marland Kitchen UNKNOWN TO PATIENT         SURGICAL HISTORY:  Past Surgical History  Procedure Date  . Thrombectomy / embolectomy femoral artery     DVT  . Nasal sinus surgery   . Tubal ligation   .  Rotator cuff repair     right  . Tubal ligation reversal   . Colonoscopy 04/27/2012    Procedure: COLONOSCOPY;  Surgeon: Beverley Fiedler, MD;  Location: [facility];  Service: Gastroenterology;  Laterality: N/A;    REVIEW OF SYSTEMS:   General: fatigue (-), night sweats (-), fever (-), pain (-) Lymph: palpable nodes (-) HEENT: vision changes (-), mucositis (-), gum bleeding (-), epistaxis (-) Cardiovascular: chest pain (-), palpitations (-) Pulmonary: shortness of breath (-), dyspnea on exertion (-), cough (-), hemoptysis (-) GI:  Early satiety (-), melena (-), dysphagia (-), nausea/vomiting (-), diarrhea (-) GU: dysuria (-), hematuria (-), incontinence (-) Musculoskeletal: joint swelling (-), joint pain (-), back pain (-) Neuro: weakness (-), numbness (-), headache (-),  confusion (-) Skin: Rash (-), lesions (-), dryness (-) Psych: depression (-), suicidal/homicidal ideation (-), feeling of hopelessness (-)   PHYSICAL EXAMINATION: Blood pressure 149/78, pulse 84, temperature 97.5 F (36.4 C), resp. rate 20, height 5\' 3"  (1.6 m), weight 167 lb 14.4 oz (76.159 kg). Body mass index is 29.74 kg/(m^2). General: Patient is a well appearing female in no acute distress HEENT: PERRLA, sclerae anicteric no conjunctival pallor, MMM Neck: supple, no palpable adenopathy Lungs: clear to auscultation bilaterally, no wheezes, rhonchi, or rales Cardiovascular: regular rate rhythm, S1, S2, no murmurs, rubs or gallops Abdomen: Soft, non-tender, non-distended, normoactive bowel sounds, no HSM Extremities: warm and well perfused, no clubbing, cyanosis, or edema Skin: No rashes or lesions Neuro: Non-focal ECOG PERFORMANCE STATUS: 0 - Asymptomatic    LABORATORY DATA: Lab Results  Component Value Date   WBC 6.8 08/14/2012   HGB 11.7 08/14/2012   HCT 34.9 08/14/2012   MCV 92.1 08/14/2012   PLT 262 08/14/2012      Chemistry      Component Value Date/Time   NA 140 07/17/2012 1350   NA 137 06/20/2012 1446   K 4.0 07/17/2012 1350   K 3.3* 06/20/2012 1446   CL 104 07/17/2012 1350   CL 99 06/20/2012 1446   CO2 29 07/17/2012 1350   CO2 26 06/20/2012 1446   BUN 7.0 07/17/2012 1350   BUN 7 06/20/2012 1446   CREATININE 0.8 07/17/2012 1350   CREATININE 0.7 06/20/2012 1446      Component Value Date/Time   CALCIUM 9.7 07/17/2012 1350   CALCIUM 9.1 06/20/2012 1446   ALKPHOS 109 07/17/2012 1350   ALKPHOS 84 03/31/2012 0620   AST 49* 07/17/2012 1350   AST 18 03/31/2012 0620   ALT 27 07/17/2012 1350   ALT 16 03/31/2012 0620   BILITOT 0.62 07/17/2012 1350   BILITOT 0.5 03/31/2012 0620       RADIOGRAPHIC STUDIES:  No results found.  ASSESSMENT and PLAN: 56 y/o with protein C and S deficiency on chronic anticoagulation due to multiple episodes of thrombosis.  She now has ischemic colitis.  She underwent her colonoscopy, and will need colorectal surgery soon.   Her appt is 12/9 with Dr. Georgana Curio.  She would like to hold off on restarting the Coumadin until she has an exact date scheduled.  She will call me on Monday and should the surgery date be after the holidays we will restart her Coumadin.  She will continue her lovenox.    All questions were answered. The patient knows to call the clinic with any problems, questions or concerns. We can certainly see the patient much sooner if necessary.  I spent 15 minutes counseling the patient face to face. The total time spent in  the appointment was 30 minutes.  This case was reviewed with Dr. Rutherford Limerick C. Lyn Hollingshead, NP Medical Oncology [facility] Phone: [phone] 08/14/2012, 12:23 PM

## 2012-08-15 NOTE — Telephone Encounter (Signed)
Informed pt I made a referral to [facility], fax [phone].  She stated understanding and asked her to let us know if she hasn't heard from them by 2 weeks.

## 2012-08-21 ENCOUNTER — Encounter (INDEPENDENT_AMBULATORY_CARE_PROVIDER_SITE_OTHER): Payer: Self-pay | Admitting: Surgery

## 2012-08-21 ENCOUNTER — Telehealth: Payer: Self-pay | Admitting: Internal Medicine

## 2012-08-21 ENCOUNTER — Ambulatory Visit (INDEPENDENT_AMBULATORY_CARE_PROVIDER_SITE_OTHER): Payer: Medicaid Other | Admitting: Surgery

## 2012-08-21 VITALS — BP 138/86 | HR 78 | Temp 97.6°F | Resp 18 | Ht 63.0 in | Wt 165.2 lb

## 2012-08-21 DIAGNOSIS — R109 Unspecified abdominal pain: Secondary | ICD-10-CM

## 2012-08-21 DIAGNOSIS — K559 Vascular disorder of intestine, unspecified: Secondary | ICD-10-CM

## 2012-08-21 NOTE — Telephone Encounter (Signed)
lmom for pt to call back

## 2012-08-21 NOTE — Patient Instructions (Signed)
[facility]  OPEN ABDOMINAL SURGERY: POST OP INSTRUCTIONS  Always review your discharge instruction sheet given to you by the facility where your surgery was performed.  1. A prescription for pain medication may be given to you upon discharge.  Take your pain medication as prescribed.  If narcotic pain medicine is not needed, then you may take acetaminophen (Tylenol) or ibuprofen (Advil) as needed. 2. Take your usually prescribed medications unless otherwise directed. 3. If you need a refill on your pain medication, please contact your pharmacy. They will contact our office to request authorization.  Prescriptions will not be filled after 5 pm or on weekends. 4. You should follow a light diet the first few days after arrival home, such as soup and crackers, unless your doctor has advised otherwise. A high-fiber, low fat diet can be resumed as tolerated.  Be sure to include plenty of fluids daily.  5. Most patients will experience some swelling and bruising in the area of the incision. Ice packs will help. Swelling and bruising can take several days to resolve. 6. It is common to experience some constipation if taking pain medication after surgery.  Increasing fluid intake and taking a stool softener will usually help or prevent this problem from occurring.  A mild laxative (Milk of Magnesia or Miralax) should be taken according to package directions if there are no bowel movements after 48 hours. 7.  You may have steri-strips (small skin tapes) in place directly over the incision.  These strips should be left on the skin for 7-10 days.  If your surgeon used skin glue on the incision, you may shower in 24 hours.  The glue will flake off over the next 2-3 weeks.  Any sutures or staples will be removed at the office during your follow-up visit. You may find that a light gauze bandage over your incision may keep your staples from being rubbed or pulled. You may shower and replace the bandage  daily. 8. ACTIVITIES:  You may resume regular (light) daily activities beginning the next day-such as daily self-care, walking, climbing stairs-gradually increasing activities as tolerated.  You may have sexual intercourse when it is comfortable.  Refrain from any heavy lifting or straining until approved by your doctor.  You may drive when you no longer are taking prescription pain medication, you can comfortably wear a seatbelt, and you can safely maneuver your car and apply brakes. 9. You should see your doctor in the office for a follow-up appointment approximately two weeks after your surgery.  Make sure that you call for this appointment within a day or two after you arrive home to insure a convenient appointment time.  WHEN TO CALL YOUR DOCTOR: 1. Fever greater than 101.0 2. Inability to urinate 3. Persistent nausea and/or vomiting 4. Extreme swelling or bruising 5. Continued bleeding from incision 6. Increased pain, redness, or drainage from the incision 7. Difficulty swallowing or breathing 8. Muscle cramping or spasms 9. Numbness or tingling in hands or around lips  IF YOU HAVE DISABILITY OR FAMILY LEAVE FORMS, YOU MUST BRING THEM TO THE OFFICE FOR PROCESSING.  PLEASE DO NOT GIVE THEM TO YOUR DOCTOR.  The clinic staff is available to answer your questions during regular business hours.  Please don't hesitate to call and ask to speak to one of the nurses if you have concerns.  [facility] Office: [phone]  For further questions, please visit [email or web address]   

## 2012-08-21 NOTE — Progress Notes (Signed)
General Surgery [facility]  Chief Complaint  Patient presents with  . New Evaluation    ischemic colitis - referral from Dr. Erick Blinks, hematologist is Dr. Welton Flakes    HISTORY: Patient is a 56 year old white female referred by her gastroenterologist for evaluation for sigmoid colectomy for management of chronic ischemic colitis with abdominal pain. Patient was first diagnosed during the summer of 2013. She has had an extensive workup including colonoscopy on 2 occasions, and evaluation by vascular surgery, and CT scans of the abdomen. Her most recent colonoscopy on 08/02/2012 shows an area of persistent ulceration in the mid sigmoid colon. Biopsies confirm ischemic colitis. Several benign polyps were also removed from the colon. A nodular area at the hepatic flexure had benign pathology results. Patient now presents for consideration for sigmoid colectomy for management of chronic ischemic colitis.  Patient has a significant past medical history of protein asked deficiency. She has had bilateral lower extremity DVTs in the past. She is on chronic anticoagulation. She will need to have a bridge of Lovenox during the perioperative interval.  Her only prior abdominal surgery was tubal ligation and attempted reversal of tubal ligation, both through a Pfannenstiel incision.  Past Medical History  Diagnosis Date  . Tobacco use disorder   . Unspecified urinary incontinence   . Complete rupture of rotator cuff   . Acute venous embolism and thrombosis of unspecified deep vessels of lower extremity   . Calculus of kidney   . Degeneration of intervertebral disc, site unspecified   . Painful respiration   . Other and unspecified noninfectious gastroenteritis and colitis   . HTN (hypertension)   . Family history of ischemic heart disease   . Anxiety state, unspecified   . HLD (hyperlipidemia)   . Primary hypercoagulable state   . Arthritis   . COPD (chronic obstructive pulmonary  disease)   . Depression   . History of gallstones   . Protein S deficiency   . Colitis   . Renal failure   . Blood dyscrasia     protein def.     Current Outpatient Prescriptions  Medication Sig Dispense Refill  . alprazolam (XANAX) 2 MG tablet Take 1 tablet (2 mg total) by mouth 3 (three) times daily as needed.  30 tablet  5  . amLODipine (NORVASC) 10 MG tablet take 1 tablet by mouth once daily  90 tablet  3  . celecoxib (CELEBREX) 200 MG capsule Take 200 mg by mouth daily.      . diclofenac sodium (VOLTAREN) 1 % GEL Apply 1 application topically 4 (four) times daily as needed. For neck pain.  100 g  11  . enoxaparin (LOVENOX) 150 MG/ML injection Inject 0.77 mLs (115 mg total) into the skin every 12 (twelve) hours.  20 Syringe  2  . oxyCODONE (ROXICODONE) 15 MG immediate release tablet Take one tablet by mouth every 6 hours when needed for pain.  28 tablet  0  . pantoprazole (PROTONIX) 40 MG tablet Take 1 tablet (40 mg total) by mouth daily.  30 tablet  6  . tiotropium (SPIRIVA) 18 MCG inhalation capsule Place 18 mcg into inhaler and inhale daily.        Marland Kitchen oxyCODONE (ROXICODONE) 5 MG immediate release tablet Take 3 tablets (15 mg total) by mouth every 4 (four) hours as needed for pain.  8 tablet  0  . UNKNOWN TO PATIENT Advair HFA 21mcg/3mcg         Allergies  Allergen Reactions  . Butalbital-Apap-Caffeine Hives  . Naproxen Hives  . Tylenol (Acetaminophen) Other (See Comments)    Upset stomach     Family History  Problem Relation Age of Onset  . Breast cancer Mother   . Cancer Mother     breast/mats to liver   . Heart attack Father   . Lung cancer Father   . Other Father     Protein S Deficiency  . Clotting disorder Father   . Diabetes Father   . Heart disease Father   . Cancer Father     Lung  . Hypertension Maternal Grandmother   . Colon cancer Maternal Grandmother   . Heart disease Maternal Grandmother   . Heart attack Paternal Grandmother   . Diabetes Sister    . Stomach cancer Neg Hx      History   Social History  . Marital Status: Single    Spouse Name: N/A    Number of Children: N/A  . Years of Education: N/A   Social History Main Topics  . Smoking status: Current Every Day Smoker -- 0.5 packs/day for 30 years    Types: Cigarettes  . Smokeless tobacco: Never Used     Comment: 0.5 packs per day  . Alcohol Use: Yes     Comment: Wine Occasionally  . Drug Use: No  . Sexually Active: Not Currently   Other Topics Concern  . None   Social History Narrative   Married.Occupation: currently disabled     REVIEW OF SYSTEMS - PERTINENT POSITIVES ONLY: Chronic left lower quadrant abdominal pain requiring narcotics. Inability to tolerate regular diet.  EXAM: Filed Vitals:   08/21/12 1436  BP: 138/86  Pulse: 78  Temp: 97.6 F (36.4 C)  Resp: 18    HEENT: normocephalic; pupils equal and reactive; sclerae clear; dentition good; mucous membranes moist NECK:  symmetric on extension; no palpable anterior or posterior cervical lymphadenopathy; no supraclavicular masses; no tenderness CHEST: clear to auscultation bilaterally without rales, rhonchi; scattered wheezes left lower lung fields CARDIAC: regular rate and rhythm without significant murmur; peripheral pulses are full ABDOMEN: soft without distension; bowel sounds present; no mass; no hepatosplenomegaly; no hernia; Mild to moderate tenderness with voluntary guarding upon palpation of the left lower quadrant; remainder of the abdomen soft and nontender without masses; well-healed Pfannenstiel incision without evidence of herniation EXT:  non-tender without edema; no deformity NEURO: no gross focal deficits; no sign of tremor   LABORATORY RESULTS: See [facility] (CHL-Epic) for most recent results   RADIOLOGY RESULTS: See [facility] (CHL-Epic) for most recent results   IMPRESSION: #1 Chronic ischemic colitis, sigmoid colon, refractory to medical management #2  protein S deficiency, on chronic anticoagulation  PLAN: I had a lengthy discussion with the patient regarding her recent history, results of her colonoscopy, and need for surgical resection of the involved segment of the colon. I provided her with written literature to review. I have recommended that she undergo sigmoid colectomy with primary anastomosis. We have discussed the risk and benefits of the procedure including infection, need for colostomy, and bleeding. We have discussed perioperative management of her anticoagulation. She understands and wishes to proceed with surgery. We will make arrangements for her procedure to be scheduled in January of 2014.  The risks and benefits of the procedure have been discussed at length with the patient.  The patient understands the proposed procedure, potential alternative treatments, and the course of recovery to be expected.  All of the patient's  questions have been answered at this time.  The patient wishes to proceed with surgery.  Velora Heckler, MD, FACS General & Endocrine Surgery [facility]   Visit Diagnoses: 1. Ischemic colitis   2. Abdominal pain     Primary Care Physician: MASSENBURG,O'LAF, PA

## 2012-08-22 ENCOUNTER — Telehealth: Payer: Self-pay | Admitting: Medical Oncology

## 2012-08-22 ENCOUNTER — Other Ambulatory Visit: Payer: Self-pay | Admitting: Oncology

## 2012-08-22 DIAGNOSIS — D6859 Other primary thrombophilia: Secondary | ICD-10-CM

## 2012-08-22 NOTE — Telephone Encounter (Signed)
lmom for pt to call back

## 2012-08-22 NOTE — Telephone Encounter (Signed)
ok 

## 2012-08-22 NOTE — Telephone Encounter (Signed)
Call from [facility], Dr Ardine Eng office, to inform Dr. Welton Flakes that patient is scheduled for a Sigmoid Colectomy 09/22/2012 at [facility] and requesting that patients anticoagulation be managed by Dr. Welton Flakes. Message forwarded to MD for review.

## 2012-08-22 NOTE — Telephone Encounter (Signed)
Pt states she saw Dr Gerrit Friends yesterday and it was eye opening! She was overwhelmed at first , but states she understands the whole picture now.  Dr Gerrit Friends will cut out more Colon than she thought, but he does not think she will need a Colostomy. Informed her the [facility] denied our referral, but she states Dr Pollyann Kennedy has agreed to manage her pain until surgery. Pt will call for further questions or problems.

## 2012-08-22 NOTE — Telephone Encounter (Signed)
Gave patient appointment appointment for two weeks with NP

## 2012-08-23 ENCOUNTER — Telehealth: Payer: Self-pay | Admitting: Medical Oncology

## 2012-08-23 NOTE — Telephone Encounter (Signed)
Patient called left [facility] with concern regarding her coumadin treatment, "I saw the surgeon on Monday and I have a question if I need a bridge since my surgery is Jan 10th, and what do I need to do?" Will review with MD. Patient has an appt for labs/NP 09/05/12.

## 2012-08-23 NOTE — Telephone Encounter (Signed)
Change appointment to after the new years

## 2012-08-23 NOTE — Telephone Encounter (Signed)
Please let patient know we will discuss this with her at her appointment on 12/24

## 2012-08-23 NOTE — Telephone Encounter (Signed)
Per MD called patient to inform her that her coumadin treatment will be discussed with her at her appointment on 12/24, per patient "I have an appointment on Christmas eve? Can we change that to the day before or any other time than Christmas Eve, I have a lot of family going to be at my house and I have to cook." Patient also with question as to whether MD wishes for patient to stay on Lovenox for now, which patient states she has been currently on. Informed patient will discuss her concerns with MD.

## 2012-08-24 ENCOUNTER — Telehealth: Payer: Self-pay | Admitting: Oncology

## 2012-08-24 NOTE — Telephone Encounter (Signed)
S/w the pt and she is aware of her appts that have been cancelled for dec and r/s for jan.

## 2012-08-24 NOTE — Telephone Encounter (Signed)
Per MD, patient is to continue on current Lovenox treatment and will reschedule patient's appt after the new year. Will notify scheduling dept of appt change. Patient verbalized understanding. No questions at this time.

## 2012-09-05 ENCOUNTER — Other Ambulatory Visit: Payer: Self-pay | Admitting: Lab

## 2012-09-05 ENCOUNTER — Ambulatory Visit: Payer: Self-pay | Admitting: Adult Health

## 2012-09-05 ENCOUNTER — Other Ambulatory Visit: Payer: Self-pay | Admitting: *Deleted

## 2012-09-05 MED ORDER — ENOXAPARIN SODIUM 150 MG/ML ~~LOC~~ SOLN: 1.5000 mg/kg | Freq: Two times a day (BID) | SUBCUTANEOUS | Status: DC

## 2012-09-11 ENCOUNTER — Telehealth: Payer: Self-pay | Admitting: Internal Medicine

## 2012-09-11 NOTE — Telephone Encounter (Signed)
Pt called and states she's suffered this long with her ischemia, maybe it will resolve and she won't need the surgery; surgery 09/22/12. She wants me to ask Dr Rhea Belton his opinion. She states she has to watch her diet and does OK with the pain meds. OK for her to wait and do another COLON in the meantime? Please advise. Thanks.

## 2012-09-12 NOTE — Telephone Encounter (Signed)
Informed pt of Dr Lauro Franklin recommendations. She will discuss this with Dr Ardine Eng ofc so they can use the slot. She will also speak with her PCP about weaning off the narcotics. Dr Rhea Belton, can pt advance her diet? Thanks.

## 2012-09-12 NOTE — Telephone Encounter (Signed)
It sounds like she is having 2nd thoughts about sigmoidectomy. She should also discuss this with Dr. Gerrit Friends.  If her symptoms are improving, then repeat colonoscopy in 2-3 months is reasonable (it had improved from colon in Aug 2013, but certainly not resolved) I do not think chronic narcotics are a good option for management of abd pain felt secondary to chronic ischemic colitis.  If she can manage without narcotics, then I am okay with the wait and see approach. Ultimately it is her decision, but I do think Dr. Gerrit Friends should be involved in this decision process

## 2012-09-13 NOTE — Telephone Encounter (Signed)
If pain is better then she can gradually reduce the low-residue diet.  Time will tell her what foods she can tolerate One major factor is smoking cessation which should dramatically help wound healing

## 2012-09-14 ENCOUNTER — Telehealth (INDEPENDENT_AMBULATORY_CARE_PROVIDER_SITE_OTHER): Payer: Self-pay | Admitting: General Surgery

## 2012-09-14 NOTE — Telephone Encounter (Signed)
Informed pt of Dr Lauro Franklin remarks and that she needs to quit smoking for better healing. She will try different foods to see what she can tolerate, but she already knows she can't tolerate tomato based foods or spicy foods. She will try to reduce her narcotics, add foods and keep [place] informed.

## 2012-09-14 NOTE — Telephone Encounter (Signed)
lmom for pt to call back

## 2012-09-14 NOTE — Telephone Encounter (Signed)
To Dr. Rhea Belton and Graciella Freer: Thanks for the update and additional information.  I will plan to cancel Ms. Heinrich's procedure scheduled for January 10th.  Hopefully she will continue to improve.  We will notify her and the hospital that the procedure is cancelled.  Please keep me informed as to her progress, and let me know if we can be of assistance in the future.  tmg  Velora Heckler, MD, [facility] Office: [phone]    Graciella Freer, RN 09/14/2012 11:07 AM Signed  Informed pt of Dr Lauro Franklin remarks and that she needs to quit smoking for better healing. She will try different foods to see what she can tolerate, but she already knows she can't tolerate tomato based foods or spicy foods. She will try to reduce her narcotics, add foods and keep [place] informed. Graciella Freer, RN 09/14/2012 9:36 AM Signed  lmom for pt to call back. Beverley Fiedler, MD 09/13/2012 9:05 PM Signed  If pain is better then she can gradually reduce the low-residue diet. Time will tell her what foods she can tolerate  One major factor is smoking cessation which should dramatically help wound healing   Graciella Freer, RN 09/12/2012 11:30 AM Signed  Informed pt of Dr Lauro Franklin recommendations. She will discuss this with Dr Ardine Eng ofc so they can use the slot. She will also speak with her PCP about weaning off the narcotics.  Dr Rhea Belton, can pt advance her diet? Thanks. Beverley Fiedler, MD 09/12/2012 9:18 AM Signed  It sounds like she is having 2nd thoughts about sigmoidectomy.  She should also discuss this with Dr. Gerrit Friends. If her symptoms are improving, then repeat colonoscopy in 2-3 months is reasonable (it had improved from colon in Aug 2013, but certainly not resolved)  I do not think chronic narcotics are a good option for management of abd pain felt secondary to chronic ischemic colitis. If she can manage without narcotics, then I am okay with the wait and see approach.  Ultimately it is her  decision, but I do think Dr. Gerrit Friends should be involved in this decision process   Graciella Freer, RN 09/11/2012 11:01 AM Signed  Pt called and states she's suffered this long with her ischemia, maybe it will resolve and she won't need the surgery; surgery 09/22/12. She wants me to ask Dr Rhea Belton his opinion. She states she has to watch her diet and does OK with the pain meds. OK for her to wait and do another COLON in the meantime? Please advise. Thanks.

## 2012-09-14 NOTE — Telephone Encounter (Signed)
Carla from [facility] called this morning and stated that she has been calling patient for 3 days to get her in for her pre op apt. She will keep trying the patient she just called over to let Dr Gerrit Friends and his nurse know that she has not talked to the patient and patient surgery is 09-22-2012

## 2012-09-18 ENCOUNTER — Ambulatory Visit: Payer: Medicaid Other | Admitting: Adult Health

## 2012-09-18 ENCOUNTER — Other Ambulatory Visit: Payer: Medicaid Other | Admitting: Lab

## 2012-09-19 ENCOUNTER — Telehealth: Payer: Self-pay | Admitting: *Deleted

## 2012-09-19 NOTE — Telephone Encounter (Signed)
Surgery scheduled for 09/22/12 has been cancelled. She is being referred to Dr. Wyline Mood at [facility] (appointment date pending) to determine if something can be done without surgery. However, may need a colonoscopy there. Currently off Coumadin 5 mg and on Lovenox bid. Wanted Dr. Welton Flakes aware of change in plans and to ask if she is to stay on current coag regimen or can she go back on Coumadin and stop Lovenox when therapeutic? Will inquire of MD.

## 2012-09-19 NOTE — Telephone Encounter (Signed)
Per MD, pt to continue Lovenox until new colonoscopy has been completed. Pt had recent appt 09/18/12, FKTA on this day. Discussed with pt to call back with colonoscopy/appt with [facility] and we will schedule a  f/u for pt to be seen after this appt. Pt verbalized understanding of all instructions. Will call back with [facility] appt as soon as she has it. No further questions at this time.

## 2012-09-22 ENCOUNTER — Inpatient Hospital Stay: Admit: 2012-09-22 | Payer: Self-pay | Admitting: Surgery

## 2012-09-22 SURGERY — COLECTOMY, PARTIAL
Anesthesia: General

## 2012-10-04 ENCOUNTER — Encounter (INDEPENDENT_AMBULATORY_CARE_PROVIDER_SITE_OTHER): Payer: Medicaid Other | Admitting: Surgery

## 2012-10-19 ENCOUNTER — Encounter (HOSPITAL_BASED_OUTPATIENT_CLINIC_OR_DEPARTMENT_OTHER): Payer: Medicaid Other | Attending: Internal Medicine

## 2012-10-19 DIAGNOSIS — I872 Venous insufficiency (chronic) (peripheral): Secondary | ICD-10-CM | POA: Insufficient documentation

## 2012-10-19 DIAGNOSIS — L97809 Non-pressure chronic ulcer of other part of unspecified lower leg with unspecified severity: Secondary | ICD-10-CM | POA: Insufficient documentation

## 2012-10-19 NOTE — Progress Notes (Signed)
[facility]  NAMECHERRYL, BABIN                 ACCOUNT NO.:  [id number]  MEDICAL RECORD NO.:  [id number]      DATE OF BIRTH:  28-Jan-1956  PHYSICIAN:  Maxwell Caul, M.D. VISIT DATE:  10/19/2012                                  OFFICE VISIT   Mrs. Peterka arrives today with a traumatic wound on her left posterior leg.  We have had her in the [facility] several times in the past for wounds in this area which are largely related to severe chronic venous insufficiency.  Most recently, we discharged her at the beginning of August 2013, with graded pressure stockings.  She tells [place] that she was well up until 2 weeks ago and she suffered a scratch from a pet dog. Unfortunately, this was in the same area as her previous venous insufficiency wounds have been.  The wound expanded in spite of treatment at home.  The area in question is as previously the left posterior leg, just above the ankle, measuring 0.8 x 1.4 x 0.1.  This underwent a non-excisional debridement of surface slough with a curette.  Hemostasis with direct pressure and silver nitrate stick.  IMPRESSIONS:  Traumatic wound in the setting of severe venous stasis. We applied a Santyl and Hydrogel under Kerlix, Coban wrap to her left leg.  This will likely require further debridement.  Fortunately, this is not nearly as extensive as her wounds have been in the past. Hopefully, this will lead to quick resolution.          ______________________________ Maxwell Caul, M.D.     MGR/MEDQ  D:  10/19/2012  T:  10/19/2012  Job:  [id number]

## 2012-10-24 ENCOUNTER — Telehealth: Payer: Self-pay | Admitting: *Deleted

## 2012-10-24 NOTE — Telephone Encounter (Signed)
Informed pt of the need for Flex Sig; pt stated understanding.  PV 11/10/12 and Flex 11/02/12.

## 2012-10-31 ENCOUNTER — Ambulatory Visit (AMBULATORY_SURGERY_CENTER): Payer: Medicaid Other

## 2012-10-31 VITALS — Ht 63.0 in | Wt 165.2 lb

## 2012-10-31 DIAGNOSIS — Z1211 Encounter for screening for malignant neoplasm of colon: Secondary | ICD-10-CM

## 2012-10-31 DIAGNOSIS — Z86010 Personal history of colonic polyps: Secondary | ICD-10-CM

## 2012-10-31 DIAGNOSIS — R197 Diarrhea, unspecified: Secondary | ICD-10-CM

## 2012-10-31 DIAGNOSIS — Z800 Family history of malignant neoplasm of digestive organs: Secondary | ICD-10-CM

## 2012-10-31 DIAGNOSIS — K559 Vascular disorder of intestine, unspecified: Secondary | ICD-10-CM

## 2012-10-31 DIAGNOSIS — R1032 Left lower quadrant pain: Secondary | ICD-10-CM

## 2012-10-31 MED ORDER — MOVIPREP 100 G PO SOLR: ORAL | Status: DC

## 2012-11-02 ENCOUNTER — Ambulatory Visit (AMBULATORY_SURGERY_CENTER): Payer: Medicaid Other | Admitting: Internal Medicine

## 2012-11-02 ENCOUNTER — Encounter: Payer: Self-pay | Admitting: Internal Medicine

## 2012-11-02 VITALS — BP 120/72 | HR 61 | Temp 97.8°F | Resp 14 | Ht 63.0 in | Wt 165.0 lb

## 2012-11-02 DIAGNOSIS — Z1211 Encounter for screening for malignant neoplasm of colon: Secondary | ICD-10-CM

## 2012-11-02 MED ORDER — SODIUM CHLORIDE 0.9 % IV SOLN: 500.0000 mL | INTRAVENOUS | Status: DC

## 2012-11-02 NOTE — Progress Notes (Signed)
Patient did not experience any of the following events: a burn prior to discharge; a fall within the facility; wrong site/side/patient/procedure/implant event; or a hospital transfer or hospital admission upon discharge from the facility. (G8907) Patient did not have preoperative order for IV antibiotic SSI prophylaxis. (G8918)  

## 2012-11-02 NOTE — Patient Instructions (Addendum)
Findings:  Significantly improved ischemic colitis Recommendations:  Continue current meds, Avoid NSAIDS (aspirin products)  YOU HAD AN ENDOSCOPIC PROCEDURE TODAY AT THE [facility]: Refer to the procedure report that was given to you for any specific questions about what was found during the examination.  If the procedure report does not answer your questions, please call your gastroenterologist to clarify.  If you requested that your care partner not be given the details of your procedure findings, then the procedure report has been included in a sealed envelope for you to review at your convenience later.  YOU SHOULD EXPECT: Some feelings of bloating in the abdomen. Passage of more gas than usual.  Walking can help get rid of the air that was put into your GI tract during the procedure and reduce the bloating. If you had a lower endoscopy (such as a colonoscopy or flexible sigmoidoscopy) you may notice spotting of blood in your stool or on the toilet paper. If you underwent a bowel prep for your procedure, then you may not have a normal bowel movement for a few days.  DIET: Your first meal following the procedure should be a light meal and then it is ok to progress to your normal diet.  A half-sandwich or bowl of soup is an example of a good first meal.  Heavy or fried foods are harder to digest and may make you feel nauseous or bloated.  Likewise meals heavy in dairy and vegetables can cause extra gas to form and this can also increase the bloating.  Drink plenty of fluids but you should avoid alcoholic beverages for 24 hours.  ACTIVITY: Your care partner should take you home directly after the procedure.  You should plan to take it easy, moving slowly for the rest of the day.  You can resume normal activity the day after the procedure however you should NOT DRIVE or use heavy machinery for 24 hours (because of the sedation medicines used during the test).    SYMPTOMS TO REPORT  IMMEDIATELY: A gastroenterologist can be reached at any hour.  During normal business hours, 8:30 AM to 5:00 PM Monday through Friday, call [phone].  After hours and on weekends, please call the GI answering service at [phone] who will take a message and have the physician on call contact you.   Following lower endoscopy (colonoscopy or flexible sigmoidoscopy):  Excessive amounts of blood in the stool  Significant tenderness or worsening of abdominal pains  Swelling of the abdomen that is new, acute  Fever of 100F or higher  Following upper endoscopy (EGD)  Vomiting of blood or coffee ground material  New chest pain or pain under the shoulder blades  Painful or persistently difficult swallowing  New shortness of breath  Fever of 100F or higher  Black, tarry-looking stools  FOLLOW UP: If any biopsies were taken you will be contacted by phone or by letter within the next 1-3 weeks.  Call your gastroenterologist if you have not heard about the biopsies in 3 weeks.  Our staff will call the home number listed on your records the next business day following your procedure to check on you and address any questions or concerns that you may have at that time regarding the information given to you following your procedure. This is a courtesy call and so if there is no answer at the home number and we have not heard from you through the emergency physician on call, we will assume that  you have returned to your regular daily activities without incident.  SIGNATURES/CONFIDENTIALITY: You and/or your care partner have signed paperwork which will be entered into your electronic medical record.  These signatures attest to the fact that that the information above on your After Visit Summary has been reviewed and is understood.  Full responsibility of the confidentiality of this discharge information lies with you and/or your care-partner.  Please follow all discharge instructions given to you  by the recovery room nurse. If you have any questions or problems after discharge please call one of the numbers listed above. You will receive a phone call in the am to see how you are doing and answer any questions you may have. Thank you for choosing [facility] for your health care needs.

## 2012-11-02 NOTE — Op Note (Signed)
[facility] [address] [place], [address]   COLONOSCOPY PROCEDURE REPORT  PATIENT: Brooke Garcia, Brooke Garcia  MR#: [id number] BIRTHDATE: 1956/08/27 , 56  yrs. old GENDER: Female ENDOSCOPIST: Beverley Fiedler, MD REFERRED BY: PROCEDURE DATE:  11/02/2012 PROCEDURE:   Colonoscopy, diagnostic ASA CLASS:   Class III INDICATIONS:Follow-up of chronic ischemic colitis. MEDICATIONS: MAC sedation, administered by CRNA and propofol (Diprivan) 300mg  IV  DESCRIPTION OF PROCEDURE:   After the risks benefits and alternatives of the procedure were thoroughly explained, informed consent was obtained.  A digital rectal exam revealed no rectal mass.   The LB [id number]-[id number]  endoscope was introduced through the anus and advanced to the cecum, which was identified by both the appendix and ileocecal valve. No adverse events experienced. The quality of the prep was good, using MoviPrep  The instrument was then slowly withdrawn as the colon was fully examined.   COLON FINDINGS: Area of circumferential erythema and mild narrowing over a very short segment (1 fold), but no erosion or ulceration in the mid-sigmoid colon.  This corresponds to the area of previously seen and biopsied ischemic colitis.  The area is nonobstructing and passed easily by the colonoscope. The colon mucosa was otherwise normal throughout the remaining colon..  Retroflexed views revealed no abnormalities. The time to cecum=6 minutes 35 seconds. Withdrawal time=11 minutes 06 seconds.  The scope was withdrawn and the procedure completed. COMPLICATIONS: There were no complications.  ENDOSCOPIC IMPRESSION: 1.   Area of circumferential erythema in mid-sigmoid.  Significantly improved ischemic colitis (no further ulceration). 2.   The colon mucosa was otherwise normal  RECOMMENDATIONS: 1.  Continue current medications (anticoagulation) 2.  Avoid NSAIDs 3.  Given improvement, no indication for surgery/segmental  colon resection.   eSigned:  Beverley Fiedler, MD 11/02/2012 2:44 PM   cc: The Patient, Dr. Caryl [facility])

## 2012-11-03 ENCOUNTER — Telehealth: Payer: Self-pay | Admitting: *Deleted

## 2012-11-03 NOTE — Telephone Encounter (Signed)
No answer, number identifier, message left to call if questions or concerns.

## 2012-11-06 ENCOUNTER — Telehealth: Payer: Self-pay | Admitting: Emergency Medicine

## 2012-11-06 NOTE — Telephone Encounter (Signed)
Patient called to notify Dr Welton Flakes that she had a colonoscopy on 11/02/12 and that she would not be having any surgical procedures in the near future. Patient states she would like to restart Coumadin and stop the Lovenox. Will call patient back with plan of care per Dr Milta Deiters instructions.

## 2012-11-07 ENCOUNTER — Other Ambulatory Visit: Payer: Self-pay | Admitting: Emergency Medicine

## 2012-11-10 ENCOUNTER — Telehealth: Payer: Self-pay | Admitting: Medical Oncology

## 2012-11-10 ENCOUNTER — Ambulatory Visit: Payer: Medicaid Other | Admitting: Oncology

## 2012-11-10 ENCOUNTER — Other Ambulatory Visit: Payer: Medicaid Other | Admitting: Lab

## 2012-11-10 NOTE — Telephone Encounter (Signed)
Patient LVMOM stating she "is sick and running a fever over a 100"  and was cancelling today's appt " I don't want to make anyone sick." States wishes to reschedule. Returned call to pt to inquire of symtoms, no answer LVMOM and asked pt to call back. MD informed.

## 2012-11-16 ENCOUNTER — Encounter (HOSPITAL_BASED_OUTPATIENT_CLINIC_OR_DEPARTMENT_OTHER): Payer: Medicaid Other | Attending: Internal Medicine

## 2012-11-16 DIAGNOSIS — I87309 Chronic venous hypertension (idiopathic) without complications of unspecified lower extremity: Secondary | ICD-10-CM | POA: Insufficient documentation

## 2012-11-16 DIAGNOSIS — L97809 Non-pressure chronic ulcer of other part of unspecified lower leg with unspecified severity: Secondary | ICD-10-CM | POA: Insufficient documentation

## 2012-11-16 DIAGNOSIS — I872 Venous insufficiency (chronic) (peripheral): Secondary | ICD-10-CM | POA: Insufficient documentation

## 2012-11-16 DIAGNOSIS — Z79899 Other long term (current) drug therapy: Secondary | ICD-10-CM | POA: Insufficient documentation

## 2012-12-12 ENCOUNTER — Telehealth: Payer: Self-pay | Admitting: Oncology

## 2012-12-12 ENCOUNTER — Other Ambulatory Visit: Payer: Self-pay | Admitting: *Deleted

## 2012-12-12 NOTE — Telephone Encounter (Signed)
VERBAL ORDER AND READ BACK TO LINDSEY ALEXANDER,NP-PT.NEEDS TO MAKE AN APPOINTMENT WITH LINDSEY ALEXANDER,NP BEFORE COUMADIN CAN BE REFILLED. REFILL REQUEST SENT BACK TO PHARMACY.

## 2012-12-14 ENCOUNTER — Telehealth: Payer: Self-pay

## 2012-12-14 ENCOUNTER — Encounter (HOSPITAL_BASED_OUTPATIENT_CLINIC_OR_DEPARTMENT_OTHER): Payer: Medicaid Other | Attending: Internal Medicine

## 2012-12-14 DIAGNOSIS — I872 Venous insufficiency (chronic) (peripheral): Secondary | ICD-10-CM | POA: Insufficient documentation

## 2012-12-14 DIAGNOSIS — L97809 Non-pressure chronic ulcer of other part of unspecified lower leg with unspecified severity: Secondary | ICD-10-CM | POA: Insufficient documentation

## 2012-12-14 NOTE — Telephone Encounter (Signed)
Spoke with pt regarding her earlier call concerning Coumadin refill. Informed her that LA would like to see her to evaluate her needs prior to any refills. Appt made for lab/LA for 4/4. Pt voiced understanding and knows to call office with any further questions. TMB

## 2012-12-15 ENCOUNTER — Telehealth: Payer: Self-pay | Admitting: Oncology

## 2012-12-15 ENCOUNTER — Other Ambulatory Visit (HOSPITAL_BASED_OUTPATIENT_CLINIC_OR_DEPARTMENT_OTHER): Payer: Medicaid Other | Admitting: Lab

## 2012-12-15 ENCOUNTER — Encounter: Payer: Self-pay | Admitting: Adult Health

## 2012-12-15 ENCOUNTER — Ambulatory Visit (HOSPITAL_BASED_OUTPATIENT_CLINIC_OR_DEPARTMENT_OTHER): Payer: Medicaid Other | Admitting: Adult Health

## 2012-12-15 VITALS — BP 138/81 | HR 71 | Temp 98.0°F | Resp 20 | Ht 63.0 in | Wt 164.1 lb

## 2012-12-15 DIAGNOSIS — Z86711 Personal history of pulmonary embolism: Secondary | ICD-10-CM

## 2012-12-15 DIAGNOSIS — Z7901 Long term (current) use of anticoagulants: Secondary | ICD-10-CM

## 2012-12-15 DIAGNOSIS — D6859 Other primary thrombophilia: Secondary | ICD-10-CM

## 2012-12-15 DIAGNOSIS — I82403 Acute embolism and thrombosis of unspecified deep veins of lower extremity, bilateral: Secondary | ICD-10-CM

## 2012-12-15 LAB — CBC WITH DIFFERENTIAL/PLATELET
BASO%: 0.4 % (ref 0.0–2.0)
Eosinophils Absolute: 0.7 10*3/uL — ABNORMAL HIGH (ref 0.0–0.5)
MCHC: 33.3 g/dL (ref 31.5–36.0)
MONO#: 0.5 10*3/uL (ref 0.1–0.9)
NEUT#: 4.4 10*3/uL (ref 1.5–6.5)
RBC: 4.49 10*6/uL (ref 3.70–5.45)
RDW: 13.8 % (ref 11.2–14.5)
WBC: 8.2 10*3/uL (ref 3.9–10.3)
lymph#: 2.5 10*3/uL (ref 0.9–3.3)

## 2012-12-15 LAB — PROTIME-INR: Protime: 15.6 Seconds — ABNORMAL HIGH (ref 10.6–13.4)

## 2012-12-15 MED ORDER — WARFARIN SODIUM 5 MG PO TABS: 5.0000 mg | ORAL_TABLET | Freq: Every day | ORAL | Status: DC

## 2012-12-15 NOTE — Progress Notes (Signed)
OFFICE PROGRESS NOTE  CC  MASSENBURG,O'LAF, PA-C [address] [place] [address]  DIAGNOSIS: Protein C and S deficiency, multiple thrombosis  PRIOR THERAPY: Chronic Coumadin  CURRENT THERAPY: Coumadin  INTERVAL HISTORY: Brooke Garcia 57 y.o. female returns for follow up.  She has again developed ulcers in her left lower extremity managed by Dr[facility] Le[facility]ord [facility]king [facility].  These have been going on intermittently for 2.5 years.  She's stopped taking her Lovenox and started Coumadin 5mg  daily.  She's been doing well with this. Her ischemic colitis followed by Dr. Rhea Belton is stable.  She's doing well otherwise, denies any easy bruising, bleeding, fevers, chills, or any further concerns.  MEDICAL HISTORY: Past Medical History  Diagnosis Date  . Tobacco use disorder   . Unspecified urinary incontinence   . Complete rupture of rotator cuff   . Acute venous embolism and thrombosis of unspecified deep vessels of lower extremity   . Calculus of kidney   . Degeneration of intervertebral disc, site unspecified   . Painful respiration   . Other and unspecified noninfectious gastroenteritis and colitis   . HTN (hypertension)   . Family history of ischemic heart disease   . Anxiety state, unspecified   . HLD (hyperlipidemia)   . Primary hypercoagulable state   . Arthritis   . COPD (chronic obstructive pulmonary disease)   . Depression   . History of gallstones   . Protein S deficiency   . Colitis   . Renal failure   . Blood dyscrasia     protein def.  . Venous stasis ulcers     left leg    ALLERGIES:  is allergic to butalbital-apap-caffeine; naproxen; and tylenol.  MEDICATIONS:  Current Outpatient Prescriptions  Medication Sig Dispense Refill  . alprazolam (XANAX) 2 MG tablet Take 1 tablet (2 mg total) by mouth 3 (three) times daily as needed.  30 tablet  5  . amLODipine (NORVASC) 10 MG tablet take 1 tablet by mouth once daily  90 tablet  3  . celecoxib (CELEBREX)  200 MG capsule Take 200 mg by mouth daily.      . diclofenac sodium (VOLTAREN) 1 % GEL Apply 1 application topically 4 (four) times daily as needed. For neck pain.  100 g  11  . oxyCODONE (ROXICODONE) 15 MG immediate release tablet Take one tablet by mouth every 6 hours when needed for pain.  28 tablet  0  . pantoprazole (PROTONIX) 40 MG tablet Take 1 tablet (40 mg total) by mouth daily.  30 tablet  6  . Tiotropium Bromide Monohydrate (SPIRIVA HANDIHALER IN) Inhale into the lungs daily.      Marland Kitchen UNKNOWN TO PATIENT Advair HFA 74mcg/3mcg      . warfarin (COUMADIN) 5 MG tablet Take 5 mg by mouth daily.      Marland Kitchen tiotropium (SPIRIVA) 18 MCG inhalation capsule Place 18 mcg into inhaler and inhale daily.         No current facility-administered medications for this visit.    SURGICAL HISTORY:  Past Surgical History  Procedure Laterality Date  . Thrombectomy / embolectomy femoral artery      DVT  . Nasal sinus surgery    . Tubal ligation    . Rotator cuff repair      right  . Tubal ligation reversal    . Colonoscopy  04/27/2012    Procedure: COLONOSCOPY;  Surgeon: Beverley Fiedler, MD;  Location: [facility];  Service: Gastroenterology;  Laterality: N/A;  REVIEW OF SYSTEMS:   General: fatigue (-), night sweats (-), fever (-), pain (-) Lymph: palpable nodes (-) HEENT: vision changes (-), mucositis (-), gum bleeding (-), epistaxis (-) Cardiovascular: chest pain (-), palpitations (-) Pulmonary: shortness of breath (-), dyspnea on exertion (-), cough (-), hemoptysis (-) GI:  Early satiety (-), melena (-), dysphagia (-), nausea/vomiting (-), diarrhea (-) GU: dysuria (-), hematuria (-), incontinence (-) Musculoskeletal: joint swelling (-), joint pain (-), back pain (-) Neuro: weakness (-), numbness (-), headache (-), confusion (-) Skin: Rash (-), lesions (-), dryness (-) Psych: depression (-), suicidal/homicidal ideation (-), feeling of hopelessness (-)   PHYSICAL EXAMINATION: Blood pressure  138/81, pulse 71, temperature 98 F (36.7 C), temperature source Oral, resp. rate 20, height 5\' 3"  (1.6 m), weight 164 lb 1.6 oz (74.435 kg). Body mass index is 29.08 kg/(m^2). General: Patient is a well appearing female in no acute distress HEENT: PERRLA, sclerae anicteric no conjunctival pallor, MMM Neck: supple, no palpable adenopathy Lungs: clear to auscultation bilaterally, no wheezes, rhonchi, or rales Cardiovascular: regular rate rhythm, S1, S2, no murmurs, rubs or gallops Abdomen: Soft, non-tender, non-distended, normoactive bowel sounds, no HSM Extremities: warm and well perfused, no clubbing, cyanosis, or edema Skin: No rashes or lesions Neuro: Non-focal ECOG PERFORMANCE STATUS: 0 - Asymptomatic    LABORATORY DATA: Lab Results  Component Value Date   WBC 8.2 12/15/2012   HGB 13.3 12/15/2012   HCT 40.0 12/15/2012   MCV 89.1 12/15/2012   PLT 271 12/15/2012      Chemistry      Component Value Date/Time   NA 140 07/17/2012 1350   NA 137 06/20/2012 1446   K 4.0 07/17/2012 1350   K 3.3* 06/20/2012 1446   CL 104 07/17/2012 1350   CL 99 06/20/2012 1446   CO2 29 07/17/2012 1350   CO2 26 06/20/2012 1446   BUN 7.0 07/17/2012 1350   BUN 7 06/20/2012 1446   CREATININE 0.8 07/17/2012 1350   CREATININE 0.7 06/20/2012 1446      Component Value Date/Time   CALCIUM 9.7 07/17/2012 1350   CALCIUM 9.1 06/20/2012 1446   ALKPHOS 109 07/17/2012 1350   ALKPHOS 84 03/31/2012 0620   AST 49* 07/17/2012 1350   AST 18 03/31/2012 0620   ALT 27 07/17/2012 1350   ALT 16 03/31/2012 0620   BILITOT 0.62 07/17/2012 1350   BILITOT 0.5 03/31/2012 0620       RADIOGRAPHIC STUDIES:  No results found.  ASSESSMENT and PLAN: 57 y/o with protein C and S deficiency on chronic anticoagulation due to multiple episodes of thrombosis.  She has been taking Coumadin 5mg  daily.  She will take 5mg  alternating with 7.5mg  and f/u next week with an INR check.  We will touch base with her in 3 months.  She will let us know if she needs  anything changed for any future procedures.    All questions were answered. The patient knows to call the clinic with any problems, questions or concerns. We can certainly see the patient much sooner if necessary.  I spent 25 minutes counseling the patient face to face. The total time spent in the appointment was 30 minutes.  This case was reviewed with Dr. Rutherford Limerick C. Lyn Hollingshead, NP Medical Oncology [facility] Phone: [phone] 12/15/2012, 3:18 PM

## 2012-12-15 NOTE — Patient Instructions (Addendum)
Take coumadin 5mg  alternating with 7.5mg .  We will re-check on Thursday morning.  Please call us if you have any questions or concerns.

## 2012-12-21 ENCOUNTER — Other Ambulatory Visit (HOSPITAL_BASED_OUTPATIENT_CLINIC_OR_DEPARTMENT_OTHER): Payer: Medicaid Other | Admitting: Lab

## 2012-12-21 DIAGNOSIS — I82409 Acute embolism and thrombosis of unspecified deep veins of unspecified lower extremity: Secondary | ICD-10-CM

## 2012-12-21 DIAGNOSIS — I82403 Acute embolism and thrombosis of unspecified deep veins of lower extremity, bilateral: Secondary | ICD-10-CM

## 2012-12-21 LAB — PROTIME-INR
INR: 2.9 (ref 2.00–3.50)
Protime: 34.8 Seconds — ABNORMAL HIGH (ref 10.6–13.4)

## 2012-12-27 ENCOUNTER — Other Ambulatory Visit: Payer: Self-pay | Admitting: Emergency Medicine

## 2013-01-11 ENCOUNTER — Encounter (HOSPITAL_BASED_OUTPATIENT_CLINIC_OR_DEPARTMENT_OTHER): Payer: Medicaid Other | Attending: Internal Medicine

## 2013-01-11 DIAGNOSIS — L97809 Non-pressure chronic ulcer of other part of unspecified lower leg with unspecified severity: Secondary | ICD-10-CM | POA: Insufficient documentation

## 2013-01-11 DIAGNOSIS — I872 Venous insufficiency (chronic) (peripheral): Secondary | ICD-10-CM | POA: Insufficient documentation

## 2013-01-19 ENCOUNTER — Encounter: Payer: Self-pay | Admitting: Internal Medicine

## 2013-01-22 ENCOUNTER — Telehealth: Payer: Self-pay | Admitting: Internal Medicine

## 2013-01-22 DIAGNOSIS — R197 Diarrhea, unspecified: Secondary | ICD-10-CM

## 2013-01-22 NOTE — Telephone Encounter (Signed)
Stool study -- GI Pathogen panel.  Continue with imodium until stool study results

## 2013-01-22 NOTE — Telephone Encounter (Signed)
Informed pt she needs to come in for a stool study and to continue Imodium per box instructions until them; pt stated understanding.

## 2013-01-22 NOTE — Telephone Encounter (Signed)
Pt reports she received macadamia nuts as a gift and ate them for 2 days and then developed uncontrollable diarrhea; the diarrhea has been continuous for 3 weeks. She has taken the max of Imodium with no help. She has no pain or cramping but is tender on the left side as always. She has hemorrhoids and she occasionally has a blood from that. Pt wants to know if we can call her in something. States she needs to get straight, her daughter sent her a ticket for [place] to see her for 6 weeks; she will leave on 02/15/13. Please advise.

## 2013-01-23 ENCOUNTER — Ambulatory Visit: Payer: Medicaid Other | Admitting: Internal Medicine

## 2013-01-25 ENCOUNTER — Telehealth: Payer: Self-pay | Admitting: Internal Medicine

## 2013-01-25 ENCOUNTER — Encounter: Payer: Self-pay | Admitting: Internal Medicine

## 2013-01-25 ENCOUNTER — Other Ambulatory Visit: Payer: Medicaid Other

## 2013-01-26 NOTE — Telephone Encounter (Signed)
Left message for pt stating I received her message and asked her to call next week.

## 2013-01-29 ENCOUNTER — Ambulatory Visit: Payer: Medicaid Other | Admitting: Internal Medicine

## 2013-01-31 ENCOUNTER — Other Ambulatory Visit: Payer: Medicaid Other

## 2013-01-31 DIAGNOSIS — R197 Diarrhea, unspecified: Secondary | ICD-10-CM

## 2013-02-01 LAB — GASTROINTESTINAL PATHOGEN PANEL PCR
C. difficile Tox A/B, PCR: NEGATIVE
Campylobacter, PCR: NEGATIVE
Cryptosporidium, PCR: NEGATIVE
E coli (STEC) stx1/stx2, PCR: NEGATIVE
Giardia lamblia, PCR: NEGATIVE
Norovirus, PCR: NEGATIVE
Rotavirus A, PCR: NEGATIVE

## 2013-02-02 ENCOUNTER — Telehealth: Payer: Self-pay | Admitting: *Deleted

## 2013-02-02 MED ORDER — DIPHENOXYLATE-ATROPINE 2.5-0.025 MG PO TABS: 1.0000 | ORAL_TABLET | Freq: Four times a day (QID) | ORAL | Status: DC | PRN

## 2013-02-02 NOTE — Telephone Encounter (Signed)
Message copied by Florene Glen on Fri Feb 02, 2013  2:11 PM ------      Message from: Beverley Fiedler      Created: Fri Feb 02, 2013  1:46 PM       I would recommend continuing with a probiotic daily, Aign or the like      Can give trial of Lomotil 1 tab four times daily PRN ------

## 2013-02-02 NOTE — Telephone Encounter (Signed)
Informed pt she may try a probiotic and we can order Lomotil. Pt stated understanding; lomotil ordered.

## 2013-02-09 ENCOUNTER — Telehealth: Payer: Self-pay | Admitting: Internal Medicine

## 2013-02-09 MED ORDER — DIPHENOXYLATE-ATROPINE 2.5-0.025 MG PO TABS: 1.0000 | ORAL_TABLET | Freq: Four times a day (QID) | ORAL | Status: DC | PRN

## 2013-02-09 NOTE — Telephone Encounter (Signed)
Informed pt I could not call in 2 month's worth of Lomotil. Medicaid will not pay for that much and her last script was just filled on 02/02/13. Please fill for one month; called in.

## 2013-02-14 ENCOUNTER — Encounter (HOSPITAL_BASED_OUTPATIENT_CLINIC_OR_DEPARTMENT_OTHER): Payer: Medicaid Other | Attending: Internal Medicine

## 2013-02-14 DIAGNOSIS — L97809 Non-pressure chronic ulcer of other part of unspecified lower leg with unspecified severity: Secondary | ICD-10-CM | POA: Insufficient documentation

## 2013-02-14 DIAGNOSIS — I872 Venous insufficiency (chronic) (peripheral): Secondary | ICD-10-CM | POA: Insufficient documentation

## 2013-02-21 ENCOUNTER — Telehealth: Payer: Self-pay | Admitting: Oncology

## 2013-02-23 ENCOUNTER — Ambulatory Visit: Payer: Self-pay | Admitting: Oncology

## 2013-02-23 ENCOUNTER — Other Ambulatory Visit: Payer: Self-pay | Admitting: Lab

## 2013-04-05 ENCOUNTER — Encounter (HOSPITAL_BASED_OUTPATIENT_CLINIC_OR_DEPARTMENT_OTHER): Payer: Medicaid Other | Attending: Internal Medicine

## 2013-04-05 ENCOUNTER — Telehealth: Payer: Self-pay | Admitting: Oncology

## 2013-04-05 DIAGNOSIS — L97809 Non-pressure chronic ulcer of other part of unspecified lower leg with unspecified severity: Secondary | ICD-10-CM | POA: Insufficient documentation

## 2013-04-05 DIAGNOSIS — I872 Venous insufficiency (chronic) (peripheral): Secondary | ICD-10-CM | POA: Insufficient documentation

## 2013-04-05 DIAGNOSIS — D6859 Other primary thrombophilia: Secondary | ICD-10-CM | POA: Insufficient documentation

## 2013-04-05 NOTE — Telephone Encounter (Signed)
Brooke Garcia moved from 7/28 to 7/29 - date per Brooke. S/w pt she is aware. °

## 2013-04-09 ENCOUNTER — Other Ambulatory Visit: Payer: Self-pay | Admitting: Lab

## 2013-04-09 ENCOUNTER — Ambulatory Visit: Payer: Medicaid Other | Admitting: Oncology

## 2013-04-10 ENCOUNTER — Other Ambulatory Visit (HOSPITAL_BASED_OUTPATIENT_CLINIC_OR_DEPARTMENT_OTHER): Payer: Medicaid Other | Admitting: Lab

## 2013-04-10 ENCOUNTER — Ambulatory Visit (HOSPITAL_BASED_OUTPATIENT_CLINIC_OR_DEPARTMENT_OTHER): Payer: Medicaid Other | Admitting: Oncology

## 2013-04-10 ENCOUNTER — Encounter: Payer: Self-pay | Admitting: Oncology

## 2013-04-10 ENCOUNTER — Telehealth: Payer: Self-pay | Admitting: Oncology

## 2013-04-10 VITALS — BP 153/82 | HR 68 | Temp 98.2°F | Resp 20 | Ht 63.0 in | Wt 170.1 lb

## 2013-04-10 DIAGNOSIS — I82403 Acute embolism and thrombosis of unspecified deep veins of lower extremity, bilateral: Secondary | ICD-10-CM

## 2013-04-10 DIAGNOSIS — I82409 Acute embolism and thrombosis of unspecified deep veins of unspecified lower extremity: Secondary | ICD-10-CM

## 2013-04-10 DIAGNOSIS — D6859 Other primary thrombophilia: Secondary | ICD-10-CM

## 2013-04-10 LAB — BASIC METABOLIC PANEL (CC13)
CO2: 29 mEq/L (ref 22–29)
Calcium: 9.4 mg/dL (ref 8.4–10.4)
Chloride: 102 mEq/L (ref 98–109)
Potassium: 3.9 mEq/L (ref 3.5–5.1)
Sodium: 142 mEq/L (ref 136–145)

## 2013-04-10 LAB — CBC WITH DIFFERENTIAL/PLATELET
BASO%: 0.3 % (ref 0.0–2.0)
Basophils Absolute: 0 10*3/uL (ref 0.0–0.1)
Eosinophils Absolute: 0.3 10*3/uL (ref 0.0–0.5)
HCT: 39.1 % (ref 34.8–46.6)
HGB: 13.1 g/dL (ref 11.6–15.9)
MONO#: 0.5 10*3/uL (ref 0.1–0.9)
NEUT#: 4.1 10*3/uL (ref 1.5–6.5)
NEUT%: 54.6 % (ref 38.4–76.8)
Platelets: 247 10*3/uL (ref 145–400)
WBC: 7.4 10*3/uL (ref 3.9–10.3)
lymph#: 2.6 10*3/uL (ref 0.9–3.3)

## 2013-04-10 LAB — PROTIME-INR

## 2013-04-10 NOTE — Patient Instructions (Addendum)
Doing well, continue coumadin at present dose  I am referring you to the coumadin clinic

## 2013-04-10 NOTE — Progress Notes (Signed)
OFFICE PROGRESS NOTE  CC  MASSENBURG,O'LAF, PA-C [address] [place] [address]  DIAGNOSIS: Protein C and S deficiency, multiple thrombosis  PRIOR THERAPY: Chronic Coumadin  CURRENT THERAPY: Coumadin  INTERVAL HISTORY: Brooke Garcia 57 y.o. female returns for follow up.  She has again developed ulcers in her left lower extremity managed by Dr. Leanord Hawking at the [facility] center.  These have been going on intermittently for 2.5 years. She is on Coumadin 5mg  with 7.5 mg on 2 days.  She's been doing well with this. Her ischemic colitis followed by Dr. Rhea Belton is stable.  She's doing well otherwise, denies any easy bruising, bleeding, fevers, chills, or any further concerns.  MEDICAL HISTORY: Past Medical History  Diagnosis Date  . Tobacco use disorder   . Unspecified urinary incontinence   . Complete rupture of rotator cuff   . Acute venous embolism and thrombosis of unspecified deep vessels of lower extremity   . Calculus of kidney   . Degeneration of intervertebral disc, site unspecified   . Painful respiration   . Other and unspecified noninfectious gastroenteritis and colitis(558.9)   . HTN (hypertension)   . Family history of ischemic heart disease   . Anxiety state, unspecified   . HLD (hyperlipidemia)   . Primary hypercoagulable state   . Arthritis   . COPD (chronic obstructive pulmonary disease)   . Depression   . History of gallstones   . Protein S deficiency   . Colitis   . Renal failure   . Blood dyscrasia     protein def.  . Venous stasis ulcers     left leg    ALLERGIES:  is allergic to butalbital-apap-caffeine; naproxen; and tylenol.  MEDICATIONS:  Current Outpatient Prescriptions  Medication Sig Dispense Refill  . alprazolam (XANAX) 2 MG tablet Take 1 tablet (2 mg total) by mouth 3 (three) times daily as needed.  30 tablet  5  . amLODipine (NORVASC) 10 MG tablet take 1 tablet by mouth once daily  90 tablet  3  . celecoxib (CELEBREX) 200 MG capsule  Take 200 mg by mouth daily.      . diclofenac sodium (VOLTAREN) 1 % GEL Apply 1 application topically 4 (four) times daily as needed. For neck pain.  100 g  11  . oxyCODONE (ROXICODONE) 15 MG immediate release tablet Take one tablet by mouth every 6 hours when needed for pain.  28 tablet  0  . pantoprazole (PROTONIX) 40 MG tablet Take 1 tablet (40 mg total) by mouth daily.  30 tablet  6  . Tiotropium Bromide Monohydrate (SPIRIVA HANDIHALER IN) Inhale into the lungs daily.      Marland Kitchen warfarin (COUMADIN) 5 MG tablet Take 1-1.5 tablets (5-7.5 mg total) by mouth daily. Titrate as directed based on INR and MD recommendation.  60 tablet  3  . diphenoxylate-atropine (LOMOTIL) 2.5-0.025 MG per tablet Take 1 tablet by mouth 4 (four) times daily as needed for diarrhea or loose stools.  120 tablet  0   No current facility-administered medications for this visit.    SURGICAL HISTORY:  Past Surgical History  Procedure Laterality Date  . Thrombectomy / embolectomy femoral artery      DVT  . Nasal sinus surgery    . Tubal ligation    . Rotator cuff repair      right  . Tubal ligation reversal    . Colonoscopy  04/27/2012    Procedure: COLONOSCOPY;  Surgeon: Beverley Fiedler, MD;  Location:  [facility];  Service: Gastroenterology;  Laterality: N/A;    REVIEW OF SYSTEMS:   General: fatigue (-), night sweats (-), fever (-), pain (-) Lymph: palpable nodes (-) HEENT: vision changes (-), mucositis (-), gum bleeding (-), epistaxis (-) Cardiovascular: chest pain (-), palpitations (-) Pulmonary: shortness of breath (-), dyspnea on exertion (-), cough (-), hemoptysis (-) GI:  Early satiety (-), melena (-), dysphagia (-), nausea/vomiting (-), diarrhea (-) GU: dysuria (-), hematuria (-), incontinence (-) Musculoskeletal: joint swelling (-), joint pain (-), back pain (-) Neuro: weakness (-), numbness (-), headache (-), confusion (-) Skin: Rash (-), lesions (-), dryness (-) Psych: depression (-), suicidal/homicidal  ideation (-), feeling of hopelessness (-)   PHYSICAL EXAMINATION: Blood pressure 153/82, pulse 68, temperature 98.2 F (36.8 C), temperature source Oral, resp. rate 20, height 5\' 3"  (1.6 m), weight 170 lb 1.6 oz (77.157 kg). Body mass index is 30.14 kg/(m^2). General: Patient is a well appearing female in no acute distress HEENT: PERRLA, sclerae anicteric no conjunctival pallor, MMM Neck: supple, no palpable adenopathy Lungs: clear to auscultation bilaterally, no wheezes, rhonchi, or rales Cardiovascular: regular rate rhythm, S1, S2, no murmurs, rubs or gallops Abdomen: Soft, non-tender, non-distended, normoactive bowel sounds, no HSM Extremities: warm and well perfused, no clubbing, cyanosis, or edema Skin: No rashes or lesions Neuro: Non-focal ECOG PERFORMANCE STATUS: 0 - Asymptomatic    LABORATORY DATA: Lab Results  Component Value Date   WBC 7.4 04/10/2013   HGB 13.1 04/10/2013   HCT 39.1 04/10/2013   MCV 93.1 04/10/2013   PLT 247 04/10/2013      Chemistry      Component Value Date/Time   NA 142 04/10/2013 1344   NA 137 06/20/2012 1446   K 3.9 04/10/2013 1344   K 3.3* 06/20/2012 1446   CL 104 07/17/2012 1350   CL 99 06/20/2012 1446   CO2 29 04/10/2013 1344   CO2 26 06/20/2012 1446   BUN 7.4 04/10/2013 1344   BUN 7 06/20/2012 1446   CREATININE 0.8 04/10/2013 1344   CREATININE 0.7 06/20/2012 1446      Component Value Date/Time   CALCIUM 9.4 04/10/2013 1344   CALCIUM 9.1 06/20/2012 1446   ALKPHOS 109 07/17/2012 1350   ALKPHOS 84 03/31/2012 0620   AST 49* 07/17/2012 1350   AST 18 03/31/2012 0620   ALT 27 07/17/2012 1350   ALT 16 03/31/2012 0620   BILITOT 0.62 07/17/2012 1350   BILITOT 0.5 03/31/2012 0620       RADIOGRAPHIC STUDIES:  No results found.  ASSESSMENT and PLAN: 57 y/o with protein C and S deficiency on chronic anticoagulation due to multiple episodes of thrombosis.  She has been taking Coumadin 5mg  daily.  She will take 5mg  alternating with 7.5mg  and f/u next week with  an INR check.  Refer to coumadin clinic  All questions were answered. The patient knows to call the clinic with any problems, questions or concerns. We can certainly see the patient much sooner if necessary.  I spent 15 minutes counseling the patient face to face. The total time spent in the appointment was 20 minutes.  Drue Second, MD Medical/Oncology [facility] [phone] (beeper) [phone] (Office)  04/10/2013, 3:07 PM

## 2013-04-12 ENCOUNTER — Encounter: Payer: Self-pay | Admitting: Pharmacist

## 2013-04-12 NOTE — Progress Notes (Signed)
New consult for the Pharmacy Coumadin Clinic Patient has been on and off coumadin since initial DVT in lower left leg at age of 42 Patient has had multiple thrombosis in her legs since then  She has Protein C and S deficiency requiring long-term anticoagulation Patient has previously been managed by Dr. Welton Flakes and has now been referred to our clinic Her INR on 04/10/13 was 2.0. She takes 5 mg daily except for 7.5 mg on Wednesdays and Saturdays (using 5 mg tablets) Her INR goal is 2-3 I spoke to patient on the phone, she will be set up to come to our clinic next Thursday 04/19/13 at 9:45am for lab and 10:00am for coumadin clinic   Christell Faith, PharmD

## 2013-04-16 ENCOUNTER — Encounter (HOSPITAL_BASED_OUTPATIENT_CLINIC_OR_DEPARTMENT_OTHER): Payer: Medicaid Other | Attending: Internal Medicine

## 2013-04-16 DIAGNOSIS — D6859 Other primary thrombophilia: Secondary | ICD-10-CM | POA: Insufficient documentation

## 2013-04-16 DIAGNOSIS — L97809 Non-pressure chronic ulcer of other part of unspecified lower leg with unspecified severity: Secondary | ICD-10-CM | POA: Insufficient documentation

## 2013-04-16 DIAGNOSIS — I872 Venous insufficiency (chronic) (peripheral): Secondary | ICD-10-CM | POA: Insufficient documentation

## 2013-04-19 ENCOUNTER — Encounter (HOSPITAL_BASED_OUTPATIENT_CLINIC_OR_DEPARTMENT_OTHER): Payer: Medicaid Other

## 2013-04-19 ENCOUNTER — Telehealth: Payer: Self-pay | Admitting: Pharmacist

## 2013-04-19 ENCOUNTER — Ambulatory Visit: Payer: Medicaid Other

## 2013-04-19 ENCOUNTER — Other Ambulatory Visit: Payer: Medicaid Other | Admitting: Lab

## 2013-04-19 NOTE — Telephone Encounter (Signed)
Called patient to leave message to r/s her initial coumadin clinic visit.

## 2013-04-20 ENCOUNTER — Telehealth: Payer: Self-pay | Admitting: Pharmacist

## 2013-04-20 ENCOUNTER — Telehealth: Payer: Self-pay | Admitting: Internal Medicine

## 2013-04-20 MED ORDER — DIPHENOXYLATE-ATROPINE 2.5-0.025 MG PO TABS: 1.0000 | ORAL_TABLET | Freq: Four times a day (QID) | ORAL | Status: DC | PRN

## 2013-04-20 NOTE — Telephone Encounter (Signed)
Returned call to patient to get CC visit r/s She was in wound care this AM and did not know she had an appmt

## 2013-04-20 NOTE — Telephone Encounter (Signed)
Faxed Lomotil into pt's pharamcy

## 2013-04-20 NOTE — Telephone Encounter (Signed)
Message copied by [name] on Fri Apr 20, 2013  2:04 PM ------      Message from: Beverley Fiedler      Created: Fri Apr 20, 2013  1:40 PM       Yes            ----- Message -----         From: [name], CMA         Sent: 04/20/2013  12:53 PM           To: Beverley Fiedler, MD             Pt is requesting a refill for diphenoxylate-atropine (LOMOTIL) 2.5-0.025           Ok to refill?             rgds             ------

## 2013-04-26 LAB — HM MAMMOGRAPHY: HM MAMMO: NORMAL

## 2013-05-04 ENCOUNTER — Telehealth: Payer: Self-pay | Admitting: Pharmacist

## 2013-05-16 ENCOUNTER — Telehealth: Payer: Self-pay | Admitting: Pharmacist

## 2013-05-16 NOTE — Telephone Encounter (Signed)
S/W pt over phone. She is unable to make her appointment tomorrow due to her wound care appointment. She stated she would call back when she knows her schedule to set up an appointment for next week. - CE

## 2013-05-17 ENCOUNTER — Ambulatory Visit: Payer: Medicaid Other

## 2013-05-17 ENCOUNTER — Other Ambulatory Visit: Payer: Medicaid Other | Admitting: Lab

## 2013-05-17 ENCOUNTER — Encounter (HOSPITAL_BASED_OUTPATIENT_CLINIC_OR_DEPARTMENT_OTHER): Payer: Medicaid Other | Attending: Internal Medicine

## 2013-05-17 DIAGNOSIS — L97809 Non-pressure chronic ulcer of other part of unspecified lower leg with unspecified severity: Secondary | ICD-10-CM | POA: Insufficient documentation

## 2013-05-17 DIAGNOSIS — D6859 Other primary thrombophilia: Secondary | ICD-10-CM | POA: Insufficient documentation

## 2013-05-17 DIAGNOSIS — I872 Venous insufficiency (chronic) (peripheral): Secondary | ICD-10-CM | POA: Insufficient documentation

## 2013-06-13 ENCOUNTER — Telehealth: Payer: Self-pay | Admitting: Internal Medicine

## 2013-06-13 NOTE — Telephone Encounter (Signed)
lmom for pt to call back

## 2013-06-14 ENCOUNTER — Encounter (HOSPITAL_BASED_OUTPATIENT_CLINIC_OR_DEPARTMENT_OTHER): Payer: Medicaid Other | Attending: Internal Medicine

## 2013-06-14 DIAGNOSIS — L97809 Non-pressure chronic ulcer of other part of unspecified lower leg with unspecified severity: Secondary | ICD-10-CM | POA: Insufficient documentation

## 2013-06-14 NOTE — Telephone Encounter (Signed)
Office visit with APP 

## 2013-06-14 NOTE — Telephone Encounter (Signed)
Pt is back from [place] and states her problems started all over again. She did well in [place] eating a lot of fruits and taking "natural medications" . The trip was bad for her legs resulting in leg ulcers . She had pain on her left side again and has to take lomotil for the diarrhea. She wakes with diarrhea and immediately takes Lomotil. If she doesn't take lomotil before she eats, she has diarrhea. Please advise. Thanks. Pt reports her new PCP is Dr Clyda Greener for [facility] purposes.

## 2013-06-14 NOTE — Telephone Encounter (Signed)
Informed pt Dr Rhea Belton wants her seen; she will see Mike Gip, PA in am.

## 2013-06-15 ENCOUNTER — Other Ambulatory Visit: Payer: Self-pay | Admitting: *Deleted

## 2013-06-15 ENCOUNTER — Encounter: Payer: Self-pay | Admitting: Physician Assistant

## 2013-06-15 ENCOUNTER — Ambulatory Visit (INDEPENDENT_AMBULATORY_CARE_PROVIDER_SITE_OTHER): Payer: Medicaid Other | Admitting: Physician Assistant

## 2013-06-15 ENCOUNTER — Telehealth: Payer: Self-pay | Admitting: *Deleted

## 2013-06-15 ENCOUNTER — Other Ambulatory Visit (INDEPENDENT_AMBULATORY_CARE_PROVIDER_SITE_OTHER): Payer: Medicaid Other

## 2013-06-15 VITALS — BP 172/100 | HR 88 | Ht 63.0 in | Wt 170.2 lb

## 2013-06-15 DIAGNOSIS — Z7901 Long term (current) use of anticoagulants: Secondary | ICD-10-CM

## 2013-06-15 DIAGNOSIS — R1012 Left upper quadrant pain: Secondary | ICD-10-CM

## 2013-06-15 DIAGNOSIS — K551 Chronic vascular disorders of intestine: Secondary | ICD-10-CM

## 2013-06-15 LAB — CBC WITH DIFFERENTIAL/PLATELET
Basophils Absolute: 0 10*3/uL (ref 0.0–0.1)
Basophils Relative: 0.4 % (ref 0.0–3.0)
Eosinophils Absolute: 0.2 10*3/uL (ref 0.0–0.7)
Lymphocytes Relative: 25.6 % (ref 12.0–46.0)
Lymphs Abs: 2.2 10*3/uL (ref 0.7–4.0)
MCHC: 34.3 g/dL (ref 30.0–36.0)
Monocytes Relative: 0.7 % — ABNORMAL LOW (ref 3.0–12.0)
Platelets: 272 10*3/uL (ref 150.0–400.0)
RDW: 16.9 % — ABNORMAL HIGH (ref 11.5–14.6)
WBC: 8.8 10*3/uL (ref 4.5–10.5)

## 2013-06-15 LAB — PROTIME-INR
INR: 1.1 ratio — ABNORMAL HIGH (ref 0.8–1.0)
Prothrombin Time: 11.2 s (ref 10.2–12.4)

## 2013-06-15 LAB — HIGH SENSITIVITY CRP: CRP, High Sensitivity: 1.16 mg/L (ref 0.000–5.000)

## 2013-06-15 MED ORDER — TRAMADOL HCL 50 MG PO TABS: 50.0000 mg | ORAL_TABLET | Freq: Four times a day (QID) | ORAL | Status: DC | PRN

## 2013-06-15 MED ORDER — MOVIPREP 100 G PO SOLR: 1.0000 | Freq: Once | ORAL | Status: DC

## 2013-06-15 MED ORDER — OXYCODONE HCL 5 MG PO TABS: 5.0000 mg | ORAL_TABLET | Freq: Four times a day (QID) | ORAL | Status: DC | PRN

## 2013-06-15 NOTE — Telephone Encounter (Signed)
Called at 1:30 PM today and LM for someone to call Amy Esterwood PA-C. We are asking for an appointment for the patient, possible today.  Colonoscopy scheduled for 06-20-2013.  Pt on coumadin and will need bridged with Lovenox.

## 2013-06-15 NOTE — Progress Notes (Addendum)
Subjective:    Patient ID: Brooke Garcia, female    DOB: [birth year], 57 y.o.   MRN: [id number]  HPI  Brooke Garcia is a pleasant 57 year old female known to Dr. Rhea Belton who has history of a hypercoagulable disorder/protein C&S deficiency with multiple prior thromboses. She is maintained on chronic Coumadin and followed by Dr. Alcide Evener from a hematology standpoint. She is currently being treated for a lower extremity ulcer. She is known to have recurrent/chronic ischemic colitis and had undergone colonoscopy in November of 2013 with finding of a colonic ulcer at 30 cm. Biopsies were done and were consistent with a chronic ischemic colitis. Followup colonoscopy in February 2013 showed improvement with no active ulcerations, there was an area of circumferential of the erythema in the mid sigmoid, no biopsies taken at that time. Surgical resection had been considered but since changes clearly improved in February she was continued on observation. Patient states that she had done well until about a week and a half ago. She says she had been intermittently having some abdominal discomfort and cramping and has fairly chronic problems with diarrhea for which she uses the Lomotil. About a week and a half ago she began having much more significant left sided abdominal pain and cramping again worse before bowel movements. She says she takes a Lomotil every morning but still generally has 4 or 5 loose to liquid bowel movements nonbloody. She's not had any fever or chills. She has been taking Celebrex and did not recall being told to stop this in the past. She last took an antibiotic about a month and a half ago.    Review of Systems  Constitutional: Positive for appetite change.  HENT: Negative.   Eyes: Negative.   Respiratory: Negative.   Cardiovascular: Negative.   Gastrointestinal: Positive for abdominal pain and diarrhea.  Endocrine: Negative.   Genitourinary: Negative.   Musculoskeletal: Positive for gait problem.   Skin: Positive for wound.  Allergic/Immunologic: Negative.   Neurological: Negative.   Hematological: Negative.   Psychiatric/Behavioral: Negative.    Outpatient Prescriptions Prior to Visit  Medication Sig Dispense Refill  . amLODipine (NORVASC) 10 MG tablet take 1 tablet by mouth once daily  90 tablet  3  . celecoxib (CELEBREX) 200 MG capsule Take 200 mg by mouth daily.      . diclofenac sodium (VOLTAREN) 1 % GEL Apply 1 application topically 4 (four) times daily as needed. For neck pain.  100 g  11  . diphenoxylate-atropine (LOMOTIL) 2.5-0.025 MG per tablet Take 1 tablet by mouth 4 (four) times daily as needed for diarrhea or loose stools.  120 tablet  1  . Tiotropium Bromide Monohydrate (SPIRIVA HANDIHALER IN) Inhale into the lungs daily.      Marland Kitchen warfarin (COUMADIN) 5 MG tablet Take 1-1.5 tablets (5-7.5 mg total) by mouth daily. Titrate as directed based on INR and MD recommendation.  60 tablet  3  . alprazolam (XANAX) 2 MG tablet Take 1 tablet (2 mg total) by mouth 3 (three) times daily as needed.  30 tablet  5  . pantoprazole (PROTONIX) 40 MG tablet Take 1 tablet (40 mg total) by mouth daily.  30 tablet  6  . oxyCODONE (ROXICODONE) 15 MG immediate release tablet Take one tablet by mouth every 6 hours when needed for pain.  28 tablet  0   No facility-administered medications prior to visit.   Allergies  Allergen Reactions  . Butalbital-Apap-Caffeine Hives  . Naproxen Hives  . Tylenol [Acetaminophen] Other (See Comments)  Upset stomach   Patient Active Problem List   Diagnosis Date Noted  . Colon polyp 04/27/2012  . Leg ulcer, left 03/29/2012  . Renal failure, acute 03/28/2012  . Ischemic colitis 03/28/2012  . Abdominal pain 03/28/2012  . Leucocytosis 03/28/2012  . Cervical spondylitis with radiculitis 10/18/2011  . Anticoagulant long-term use 06/17/2011  . Other screening mammogram 05/11/2011  . Chronic bronchitis 03/25/2011  . TOBACCO ABUSE 12/29/2007  . DVT of lower  extremity, bilateral 12/29/2007  . DEGENERATIVE DISC DISEASE 12/29/2007  . HYPERTENSION, BENIGN ESSENTIAL 06/26/2007  . ANXIETY STATE NOS 05/30/2007  . HYPERCHOLESTEROLEMIA, PURE 01/24/2007  . HYPERCOAGULABLE STATE, PRIMARY 01/18/2007   History  Substance Use Topics  . Smoking status: Current Every Day Smoker -- 0.50 packs/day for 30 years    Types: Cigarettes  . Smokeless tobacco: Never Used     Comment: 0.5 packs per day  . Alcohol Use: No     Comment: Wine Occasionally   Allergies  Allergen Reactions  . Butalbital-Apap-Caffeine Hives  . Naproxen Hives  . Tylenol [Acetaminophen] Other (See Comments)    Upset stomach   History  Substance Use Topics  . Smoking status: Current Every Day Smoker -- 0.50 packs/day for 30 years    Types: Cigarettes  . Smokeless tobacco: Never Used     Comment: 0.5 packs per day  . Alcohol Use: No     Comment: Wine Occasionally   family history includes Breast cancer in her mother; Cancer in her father and mother; Clotting disorder in her father; Colon cancer in her maternal grandmother; Diabetes in her father and sister; Heart attack in her father and paternal grandmother; Heart disease in her father and maternal grandmother; Hypertension in her maternal grandmother; Lung cancer in her father; Other in her father. There is no history of Stomach cancer.     Objective:   Physical Exam no acute distress, pleasant blood pressure 170/100, Pulse 88, cap height 5 foot 3, Weight 170. HEENT; nontraumatic normocephalic EOMI PERRLA sclera anicteric, Supple no JVD, Cardiovascular ;regular rate and rhythm with S1-S2 no murmur or gallop, Pulmonary; clear bilaterally, Abdomen; soft .she is quite tender in the left upper quadrant left mid quadrant in less so the left lower quadrant there is no guarding or rebound no palpable mass or hepatosplenomegaly bowel sounds are present there is no bruit, Rectal; exam not done, Extremities; no clubbing cyanosis or edema she does  have her LLE wrapped from a chronic wound/ulcer, Psych; mood and affect normal and appropriate        Assessment & Plan:  #3  57 year old female with probable recurrent acute segmental ischemic colitis in setting of underlying chronic/recurrent ischemic colitis. #2 hypercoagulable disorder with protein C. and S. Deficiency #3 history of multiple prior thromboses #4 chronic anticoagulation with Coumadin #5 hypertension #6 lower extremity ulcer #7 smoker  Plan; Patient is asked to stop Celebrex and not use any anti-inflammatories Prescription given for oxycodone 5 mg 1 every 4-6 hours as needed for pain #40 no refills Check CBC with differential and CRP as well as stat pro time She is scheduled for colonoscopy with Dr. Rhea Belton next week-procedure discussed in detail with patient she is agreeable to proceed. She will require a Lovenox bridge, and have contacted the Coumadin clinic at the cancer center to arrange this. Not discussed today but obviously patient would benefit from quitting smoking giving her multitude of vascular issues  Addendum: Reviewed and agree with management. She has previously met with a Development worker, international aid,  and if indeed, her left-sided ischemic colitis has returned, the best treatment course would be segmental resection. Tobacco cessation is paramount, which we have discussed in the past Beverley Fiedler, MD

## 2013-06-15 NOTE — Patient Instructions (Signed)
Please go to the basement level to have your labs drawn.  We are faxing the Tramadol and the

## 2013-06-15 NOTE — Telephone Encounter (Signed)
At 310pm this RN spoke with  Judeth Cornfield pharmacist in coumadin clinic per call that was received from [facility] stating pt needs to start Lovenox bridge this weekend for colonoscopy to be performed on 06/20/2013.  This RN contacted Sondra Come PA at GI office and was informed pt was in this AM for visit with INR of 1.1.  Pt is not in there office now.  Per inquiry Amy E states pt has self administer lovenox before due to long history of Protein S defenciency and previous procedures.  This RN attempted to contact pt to discuss above and obtiained VM.  Message left for a return call to Dr Milta Deiters RN to discuss the above.  This RN's name and Dr Milta Deiters RN - Corrie Dandy- names left for contact.  This note will be left for Dr Welton Flakes.

## 2013-06-18 ENCOUNTER — Other Ambulatory Visit: Payer: Self-pay | Admitting: Emergency Medicine

## 2013-06-18 ENCOUNTER — Telehealth: Payer: Self-pay | Admitting: Internal Medicine

## 2013-06-18 MED ORDER — OXYCODONE HCL 15 MG PO TABS: ORAL_TABLET | ORAL | Status: DC

## 2013-06-18 NOTE — Telephone Encounter (Signed)
Yes, and be sure she gets back on her coumadin now- her INR was normal on Friday so she may need lovenox for a few days even without the procedure- have her call Dr. Welton Flakes s office for advice on that

## 2013-06-18 NOTE — Telephone Encounter (Signed)
Informed pt her script is at the front desk.

## 2013-06-18 NOTE — Telephone Encounter (Signed)
No charge. She is on warfarin and usually requires lovenox bridge to colonoscopy.  This is per the hematology clinic who manages her warfarin. Please ensure that when she schedules she coordinates with her anticoag clinic before her colonoscopy

## 2013-06-18 NOTE — Telephone Encounter (Signed)
Pt saw Mike Gip, PA last week for abdominal pain and has had to r/s her procedure. She needs a Lovenox bridge and is waiting to hear from Dr Milta Deiters ofc so she r/s to 07/04/13. Now, the pain is bad and she had a low tolerance and Amy gave her 5 mg Oxycodone tabs; she was on 15 mg in the last and has had to take 3-5mg  tabs. Amy gave her 40 tabs on 06/15/13 and she's almost out of them; will you give her another script with 15 mg tabs? Thanks.

## 2013-06-18 NOTE — Telephone Encounter (Signed)
Okay to dispense oxycodone 15 mg tablet every 6 hours as needed for acute, recurrent left sided abd pain, felt most likely recurrent ischemic colitis after visit with [place], PA-C last week #60, no refills

## 2013-06-19 ENCOUNTER — Other Ambulatory Visit: Payer: Self-pay | Admitting: Emergency Medicine

## 2013-06-20 ENCOUNTER — Telehealth: Payer: Self-pay | Admitting: Oncology

## 2013-06-20 ENCOUNTER — Encounter: Payer: Medicaid Other | Admitting: Internal Medicine

## 2013-06-20 NOTE — Telephone Encounter (Signed)
lmonvm advising the pt of her oct appt with the pa.

## 2013-06-25 ENCOUNTER — Telehealth: Payer: Self-pay | Admitting: Pharmacist

## 2013-06-25 NOTE — Telephone Encounter (Signed)
Pt called coumadin clinic this am to reschedule her missed lab and coumadin visit. Pt has consistently missed and canceled visits due to ongoing pain and wound care issues. Pt originally seen by Dr. Welton Flakes, now to be managed by our clinic. Set up appointment for Thursday 06/28/13 at 10am for lab and 10:15am for coumadin clinic. Pt also states she is to have colonoscopy next week and will likely need a lovenox bridge. Will discuss with Dr. Welton Flakes and we can provide education on bridging schedule and instructions as well as Lovenox at Ms. Boden's appointment  Christell Faith, PharmD

## 2013-06-29 ENCOUNTER — Ambulatory Visit: Payer: Medicaid Other | Admitting: Oncology

## 2013-06-29 ENCOUNTER — Ambulatory Visit (HOSPITAL_BASED_OUTPATIENT_CLINIC_OR_DEPARTMENT_OTHER): Payer: Medicaid Other | Admitting: Family

## 2013-06-29 ENCOUNTER — Other Ambulatory Visit (HOSPITAL_BASED_OUTPATIENT_CLINIC_OR_DEPARTMENT_OTHER): Payer: Medicaid Other

## 2013-06-29 ENCOUNTER — Encounter: Payer: Self-pay | Admitting: Family

## 2013-06-29 ENCOUNTER — Ambulatory Visit (HOSPITAL_BASED_OUTPATIENT_CLINIC_OR_DEPARTMENT_OTHER): Payer: Medicaid Other | Admitting: Pharmacist

## 2013-06-29 VITALS — BP 157/89 | HR 91 | Temp 98.0°F | Resp 20 | Ht 63.0 in | Wt 169.0 lb

## 2013-06-29 DIAGNOSIS — Z7901 Long term (current) use of anticoagulants: Secondary | ICD-10-CM

## 2013-06-29 DIAGNOSIS — D6859 Other primary thrombophilia: Secondary | ICD-10-CM

## 2013-06-29 DIAGNOSIS — I82409 Acute embolism and thrombosis of unspecified deep veins of unspecified lower extremity: Secondary | ICD-10-CM

## 2013-06-29 DIAGNOSIS — I82403 Acute embolism and thrombosis of unspecified deep veins of lower extremity, bilateral: Secondary | ICD-10-CM

## 2013-06-29 LAB — POCT INR: INR: 1.6

## 2013-06-29 MED ORDER — ENOXAPARIN SODIUM 150 MG/ML ~~LOC~~ SOLN: 1.0000 mg/kg | Freq: Two times a day (BID) | SUBCUTANEOUS | Status: DC

## 2013-06-29 NOTE — Progress Notes (Signed)
INR below goal today.  Ms Molden has been on coumadin on and off for over 30 yrs.  She has a good understanding about the drug and monitoring.  She has a colonoscopy planned for 07/04/13 to evaluate if surgery is needed to take out part of her colon.  She did not take her coumadin yesterday or today.  Ms Ditommaso has an appt with Dr. Debbe Odea. Durene Cal, NP today to discuss Lovenox bridge for colonoscopy.  I instructed Ms Monda to resume coumadin at her regular dose of 7.5mg  on Wed/Sat and 5mg  other days the night of colonoscopy, 10/22 (unless surgery is planned.)  If coumadin is resumed, she will come back to coumadin clinic on 10/30.  Ms Lysne will call us with results of colonoscopy to verify anti-coag plan.

## 2013-06-29 NOTE — Patient Instructions (Signed)
Please contact us at [phone] if you have any questions or concerns.  Please continue to do well and enjoy life!!!  Get plenty of rest, drink plenty of water, exercise daily (walking or chair exercises), eat a balanced diet.  Smoking cessation class at [facility] offered on Mondays from 12:00 pm - 1:30 pm in room 2-037.  Lovenox: Start Lovenox this evening Take last Lovenox injection on evening of 07/02/2013 No Lovenox on Tuesday 07/03/2013 Start Lovenox injections again on Wednesday 07/04/2013 after colonoscopy if you don't have any complications Start taking Coumadin with Lovenox injection on Thursday 07/05/2013

## 2013-06-29 NOTE — Progress Notes (Addendum)
[facility]  [phone] [phone]  OFFICE PROGRESS NOTE  ID: Brooke Garcia   DOB: 1956/05/13  MR#: [id number]  JYN#:[id number]   PCP: Clyda Greener, M.D. GI:  Erick Blinks, M.D.   DIAGNOSIS: Protein C and S deficiency, multiple thrombosis   PRIOR THERAPY: Chronic Coumadin    CURRENT THERAPY: Coumadin   INTERVAL HISTORY: Brooke Garcia 57 y.o. female returns for follow up of protein C. and protein S deficiency and history of multiple thrombosis.  She has again developed ulcers in her left lower extremity managed by Dr. Leanord Hawking [facility].  She has been seen by the [facility] Center every week since 10/2012.   Her leg ulcers have been going on intermittently for 2.5 years.  She states her dad also had the same problem with his legs.   She is on chronic Coumadin therapy managed by the [facility]in Clinic.  Her ischemic colitis is followed by Dr. Rhea Belton and has been stable.   Today she has complaints of chronic leg pain and intermittent pain in her stomach.  She states she had diarrhea "on and off" x 1 year, but her bowel movements are normal as of late since starting on pain medication.  She presents today for a bridge from Coumadin to Lovenox  for upcoming colonoscopy with Dr. Rhea Belton.  We also discussed smoking cessation in detail and she was offered to attend a free smoking cessation class offered by [facility] every Monday from 12 PM through 1:30 PM.  Her interval history is otherwise unremarkable.     MEDICAL HISTORY: Past Medical History  Diagnosis Date  . Tobacco use disorder   . Unspecified urinary incontinence   . Complete rupture of rotator cuff   . Acute venous embolism and thrombosis of unspecified deep vessels of lower extremity   . Calculus of kidney   . Degeneration of intervertebral disc, site unspecified   . Painful respiration   . Other and unspecified noninfectious gastroenteritis and colitis(558.9)   . HTN (hypertension)   . Family  history of ischemic heart disease   . Anxiety state, unspecified   . HLD (hyperlipidemia)   . Primary hypercoagulable state   . Arthritis   . COPD (chronic obstructive pulmonary disease)   . Depression   . History of gallstones   . Protein S deficiency   . Colitis   . Renal failure   . Blood dyscrasia     protein def.  . Venous stasis ulcers     left leg    ALLERGIES:   Allergies  Allergen Reactions  . Butalbital-Apap-Caffeine Hives  . Naproxen Hives  . Tylenol [Acetaminophen] Other (See Comments)    Upset stomach     MEDICATIONS:  Current Outpatient Prescriptions  Medication Sig Dispense Refill  . alprazolam (XANAX) 2 MG tablet Take 0.5 mg by mouth 2 (two) times daily as needed.      Marland Kitchen amLODipine (NORVASC) 10 MG tablet take 1 tablet by mouth once daily  90 tablet  3  . celecoxib (CELEBREX) 200 MG capsule Take 200 mg by mouth daily.      . diclofenac sodium (VOLTAREN) 1 % GEL Apply 1 application topically 4 (four) times daily as needed. For neck pain.  100 g  11  . diphenoxylate-atropine (LOMOTIL) 2.5-0.025 MG per tablet Take 1 tablet by mouth 4 (four) times daily as needed for diarrhea or loose stools.  120 tablet  1  . MOVIPREP 100 G SOLR Take 1 kit (  200 g total) by mouth once. "Pharmacist please use BIN: [id number] GROUP: [id number] ID: [id number] Call [phone] for pharmacy questions "Pt will save $10"  1 kit  0  . Tiotropium Bromide Monohydrate (SPIRIVA HANDIHALER IN) Inhale into the lungs daily.      Marland Kitchen warfarin (COUMADIN) 5 MG tablet Take 1-1.5 tablets (5-7.5 mg total) by mouth daily. Titrate as directed based on INR and MD recommendation.  60 tablet  3  . enoxaparin (LOVENOX) 150 MG/ML injection Inject 0.51 mLs (75 mg total) into the skin every 12 (twelve) hours.  20 Syringe  0  . oxyCODONE (ROXICODONE) 15 MG immediate release tablet Take one tablet by mouth every 6 hours as needed for acute recurrent left side abdominal pain.  60 tablet  0  . pantoprazole  (PROTONIX) 40 MG tablet Take 1 tablet (40 mg total) by mouth daily.  30 tablet  6  . traMADol (ULTRAM) 50 MG tablet Take 1 tablet (50 mg total) by mouth every 6 (six) hours as needed for pain.  40 tablet  0   No current facility-administered medications for this visit.    SURGICAL HISTORY:  Past Surgical History  Procedure Laterality Date  . Thrombectomy / embolectomy femoral artery      DVT  . Nasal sinus surgery    . Tubal ligation    . Rotator cuff repair      right  . Tubal ligation reversal    . Colonoscopy  04/27/2012    Procedure: COLONOSCOPY;  Surgeon: Beverley Fiedler, MD;  Location: [facility];  Service: Gastroenterology;  Laterality: N/A;    REVIEW OF SYSTEMS:   A 10 point review of systems was completed and is negative except as noted above.  Brooke Garcia denies any other symptomatology including fatigue, fever or chills, headache, vision changes, swollen glands, cough or shortness of breath, chest pain or discomfort, nausea, vomiting, diarrhea, constipation, change in urinary or bowel habits, any other arthralgias/myalgias, unusual bleeding/bruising or any other symptomatology.  PHYSICAL EXAMINATION: Blood pressure 157/89, pulse 91, temperature 98 F (36.7 C), temperature source Oral, resp. rate 20, height 5\' 3"  (1.6 m), weight 169 lb (76.658 kg). Body mass index is 29.94 kg/(m^2).  ECOG PERFORMANCE STATUS: 1 - Symptomatic but completely ambulatory  General appearance: Alert, cooperative, well nourished, no apparent distress Head: Normocephalic, without obvious abnormality, atraumatic, upper and lower dentures Eyes: Conjunctivae/corneas clear, PERRLA, EOMI Nose: Nares, septum and mucosa are normal, no drainage or sinus tenderness Neck: No adenopathy, supple, symmetrical, trachea midline, no tenderness Resp: Clear to auscultation bilaterally, no wheezes/rales/rhonchi Cardio: Regular rate and rhythm, S1, S2 normal, no murmur, click, rub or gallop, no edema GI: Soft,  distended, left lower quadrant/left upper quadrant/left flank tenderness, hypoactive bowel sounds, no organomegaly Skin: Left lower extremity wrapped with Ace bandage from foot to knee, no rashes/lesions, all other skin warm and dry, no erythematous areas, no cyanosis, bilateral lower extremity varicosities  M/S:  Atraumatic, normal strength in all extremities, normal range of motion, no clubbing, bilateral lower extremities cool to the touch but well perfused Lymph nodes: Cervical, supraclavicular, and axillary nodes normal Neurologic: Grossly normal, cranial nerves II through XII intact, alert and oriented x 3 Psych: Appropriate affect    LABORATORY DATA: Lab Results  Component Value Date   WBC 8.8 06/15/2013   HGB 14.9 06/15/2013   HCT 43.4 06/15/2013   MCV 94.9 06/15/2013   PLT 272.0 06/15/2013      Chemistry      Component  Value Date/Time   NA 142 04/10/2013 1344   NA 137 06/20/2012 1446   K 3.9 04/10/2013 1344   K 3.3* 06/20/2012 1446   CL 104 07/17/2012 1350   CL 99 06/20/2012 1446   CO2 29 04/10/2013 1344   CO2 26 06/20/2012 1446   BUN 7.4 04/10/2013 1344   BUN 7 06/20/2012 1446   CREATININE 0.8 04/10/2013 1344   CREATININE 0.7 06/20/2012 1446      Component Value Date/Time   CALCIUM 9.4 04/10/2013 1344   CALCIUM 9.1 06/20/2012 1446   ALKPHOS 109 07/17/2012 1350   ALKPHOS 84 03/31/2012 0620   AST 49* 07/17/2012 1350   AST 18 03/31/2012 0620   ALT 27 07/17/2012 1350   ALT 16 03/31/2012 0620   BILITOT 0.62 07/17/2012 1350   BILITOT 0.5 03/31/2012 0620      RADIOGRAPHIC STUDIES: No results found.    ASSESSMENT and PLAN:   Brooke Garcia is a 58 y.o. female with protein C and protein S deficiency on chronic anticoagulation therapy secondary to a history of multiple episodes of thrombosis.  Chronic Coumadin therapy is  managed by the Coumadin Clinic.  INR today is 1.7.  The patient stopped taking Coumadin regularly 2 days ago in preparation for upcoming colonoscopy scheduled with Dr.  Rhea Belton on 07/04/2013.  An electronic prescription for Lovenox 1 mg/kg to be injected into the skin every 12 hours #20 syringes with 0 refills with written instructions to start Lovenox injections tonight, two injections on Monday of next week, no injections on Tuesday of next week, Wednesday after the colonoscopy, start Lovenox injection the evening (barring any procedural complications), restart taking Coumadin as prescribed on Thursday of next week and continue Lovenox injections for 4 more days until Coumadin is therapeutic.  She is scheduled to be seen by Coumadin clinic again on 07/12/2013.  We plan to see Brooke Garcia again in 3 months at which time we will check laboratories of CBC.  All questions answered.  Brooke Garcia was encouraged to contact us in the interim with any problems, questions, or concerns.  Larina Bras, NP-C 06/30/2013    5:25 PM  ATTENDING'S ATTESTATION:  I personally reviewed patient's chart, examined patient myself, formulated the treatment plan as followed.    Patient and I discussed exactly how to reach her for her upcoming colonoscopy. She is going to continue Lovenox until 24 hrs. Prior to the colonoscopy that is continuous. She'll restart her Lovenox the evening after her colonoscopy. We'll then start the Coumadin. She'll continue the Coumadin and Lovenox until her INR is therapeutic and she will discontinue Lovenox. Patient will be given prescriptions for Lovenox.  Drue Second, MD Medical/Oncology [facility] [phone] (beeper) [phone] (Office)

## 2013-07-02 ENCOUNTER — Encounter: Payer: Self-pay | Admitting: *Deleted

## 2013-07-02 ENCOUNTER — Telehealth: Payer: Self-pay | Admitting: Gastroenterology

## 2013-07-02 ENCOUNTER — Encounter: Payer: Self-pay | Admitting: Family

## 2013-07-02 NOTE — Progress Notes (Signed)
RECEIVED A FAX FROM [facility] CONCERNING A PRIOR AUTHORIZATION FOR ENOXAPARIN. THIS REQUEST WAS PLACED IN THE MANAGED CARE BIN.

## 2013-07-02 NOTE — Telephone Encounter (Signed)
Went over instructions again with pt. Since we have changed her procedure time, she wanted to make sure she was taking the prep at the right times. Pt verbalized understanding to the instructions

## 2013-07-02 NOTE — Telephone Encounter (Signed)
Message copied by [name] on Mon Jul 02, 2013  3:08 PM ------      Message from: [name]      Created: Mon Jul 02, 2013 12:04 PM       Per pt she does want to take the earlier procedure. She just needs to talk to someone about her prep instructions changing.  ------

## 2013-07-02 NOTE — Progress Notes (Signed)
Per [facility] pharmacy has to fill brand lovenox instead of generic; no pa required.

## 2013-07-04 ENCOUNTER — Encounter: Payer: Medicaid Other | Admitting: Internal Medicine

## 2013-07-04 ENCOUNTER — Ambulatory Visit (AMBULATORY_SURGERY_CENTER): Payer: Medicaid Other | Admitting: Internal Medicine

## 2013-07-04 ENCOUNTER — Encounter: Payer: Self-pay | Admitting: Internal Medicine

## 2013-07-04 VITALS — BP 141/80 | HR 70 | Temp 99.2°F | Resp 16 | Ht 63.0 in | Wt 170.0 lb

## 2013-07-04 DIAGNOSIS — K551 Chronic vascular disorders of intestine: Secondary | ICD-10-CM

## 2013-07-04 DIAGNOSIS — R195 Other fecal abnormalities: Secondary | ICD-10-CM

## 2013-07-04 DIAGNOSIS — R109 Unspecified abdominal pain: Secondary | ICD-10-CM

## 2013-07-04 DIAGNOSIS — Z7901 Long term (current) use of anticoagulants: Secondary | ICD-10-CM

## 2013-07-04 MED ORDER — OXYCODONE HCL 15 MG PO TABS: ORAL_TABLET | ORAL | Status: DC

## 2013-07-04 MED ORDER — HYOSCYAMINE SULFATE 0.125 MG SL SUBL: 0.1250 mg | SUBLINGUAL_TABLET | SUBLINGUAL | Status: DC | PRN

## 2013-07-04 MED ORDER — SODIUM CHLORIDE 0.9 % IV SOLN: 500.0000 mL | INTRAVENOUS | Status: DC

## 2013-07-04 NOTE — Progress Notes (Signed)
Patient did not experience any of the following events: a burn prior to discharge; a fall within the facility; wrong site/side/patient/procedure/implant event; or a hospital transfer or hospital admission upon discharge from the facility. (G8907)Patient did not have preoperative order for IV antibiotic SSI prophylaxis. (G8918) ewm 

## 2013-07-04 NOTE — Patient Instructions (Addendum)
YOU HAD AN ENDOSCOPIC PROCEDURE TODAY AT THE [facility]: Refer to the procedure report that was given to you for any specific questions about what was found during the examination.  If the procedure report does not answer your questions, please call your gastroenterologist to clarify.  If you requested that your care partner not be given the details of your procedure findings, then the procedure report has been included in a sealed envelope for you to review at your convenience later.  YOU SHOULD EXPECT: Some feelings of bloating in the abdomen. Passage of more gas than usual.  Walking can help get rid of the air that was put into your GI tract during the procedure and reduce the bloating. If you had a lower endoscopy (such as a colonoscopy or flexible sigmoidoscopy) you may notice spotting of blood in your stool or on the toilet paper. If you underwent a bowel prep for your procedure, then you may not have a normal bowel movement for a few days.  DIET: Your first meal following the procedure should be a light meal and then it is ok to progress to your normal diet.  A half-sandwich or bowl of soup is an example of a good first meal.  Heavy or fried foods are harder to digest and may make you feel nauseous or bloated.  Likewise meals heavy in dairy and vegetables can cause extra gas to form and this can also increase the bloating.  Drink plenty of fluids but you should avoid alcoholic beverages for 24 hours.  ACTIVITY: Your care partner should take you home directly after the procedure.  You should plan to take it easy, moving slowly for the rest of the day.  You can resume normal activity the day after the procedure however you should NOT DRIVE or use heavy machinery for 24 hours (because of the sedation medicines used during the test).    SYMPTOMS TO REPORT IMMEDIATELY: A gastroenterologist can be reached at any hour.  During normal business hours, 8:30 AM to 5:00 PM Monday through Friday,  call [phone].  After hours and on weekends, please call the GI answering service at [phone]  Emergency number who will take a message and have the physician on call contact you.   Following lower endoscopy (colonoscopy or flexible sigmoidoscopy):  Excessive amounts of blood in the stool  Significant tenderness or worsening of abdominal pains  Swelling of the abdomen that is new, acute  Fever of 100F or higher  FOLLOW UP: If any biopsies were taken you will be contacted by phone or by letter within the next 1-3 weeks.  Call your gastroenterologist if you have not heard about the biopsies in 3 weeks.  Our staff will call the home number listed on your records the next business day following your procedure to check on you and address any questions or concerns that you may have at that time regarding the information given to you following your procedure. This is a courtesy call and so if there is no answer at the home number and we have not heard from you through the emergency physician on call, we will assume that you have returned to your regular daily activities without incident.  SIGNATURES/CONFIDENTIALITY: You and/or your care partner have signed paperwork which will be entered into your electronic medical record.  These signatures attest to the fact that that the information above on your After Visit Summary has been reviewed and is understood.  Full responsibility of the   confidentiality of this discharge information lies with you and/or your care-partner.   Normal colon today You can resume Lovenox bridge today along with Coumadin per the anticoagulation clinic instructions You can continue lomotil as directed and as needed for loose stools -call for refills  Levsin as needed and as directed for abdominal cramping pain-pick this up at your pharmacy Dr Rhea Belton increased your pain medicine to every 4 hours. Script to patient with DC instructions

## 2013-07-04 NOTE — Op Note (Signed)
[facility] [address] [place], [address]   COLONOSCOPY PROCEDURE REPORT  PATIENT: Brooke Garcia, Brooke Garcia  MR#: [id number] BIRTHDATE: 1955/11/08 , 56  yrs. old GENDER: Female ENDOSCOPIST: Beverley Fiedler, MD PROCEDURE DATE:  Cindi Carbon Physician] PROCEDURE:   Colonoscopy, diagnostic First Screening Colonoscopy - Avg.  risk and is 50 yrs.  old or older - No.  Prior Negative Screening - Now for repeat screening. N/A  History of Adenoma - Now for follow-up colonoscopy & has been > or = to 3 yrs.  N/A  Polyps Removed Today? No.  Recommend repeat exam, <10 yrs? No. ASA CLASS:   Class III INDICATIONS:abdominal pain in the lower left quadrant, history of ischemic colitis,  diarrhea. MEDICATIONS: MAC sedation, administered by CRNA and propofol (Diprivan) 250mg  IV  DESCRIPTION OF PROCEDURE:   After the risks benefits and alternatives of the procedure were thoroughly explained, informed consent was obtained.  A digital rectal exam revealed no rectal mass.   The LB [id number]-[id number]  endoscope was introduced through the anus and advanced to the cecum, which was identified by both the appendix and ileocecal valve. No adverse events experienced. The quality of the prep was good, using MoviPrep  The instrument was then slowly withdrawn as the colon was fully examined.   COLON FINDINGS: A normal appearing cecum, ileocecal valve, and appendiceal orifice were identified.  The ascending, hepatic flexure, transverse, splenic flexure, descending, sigmoid colon and rectum appeared unremarkable. There is scarring near the hepatic flexure and a short segment in the sigmoid colon.  No active inflammation, ulceration or erosions seen.  No polyps or cancers were seen.  Retroflexed views revealed no abnormalities. The time to cecum=5 minutes 50 seconds.  Withdrawal time=9 minutes 39 seconds.  The scope was withdrawn and the procedure completed.  COMPLICATIONS: There were no  complications.  ENDOSCOPIC IMPRESSION: Normal colon; scarring in the right colon and short segment of sigmoid colon felt secondary to healed ischemic colitis.  No active areas of ischemia at present  RECOMMENDATIONS: 1.  Can resume Lovenox bridge today along with warfarin per anticoagulation clinic instructions 2.  Can continue Lomotil as directed and as needed for loose stools 3.  Levsin as needed and as directed for abdominal cramping pain   eSigned:  Beverley Fiedler, MD 07/04/2013 10:54 AM   cc: The Patient

## 2013-07-04 NOTE — Progress Notes (Signed)
Lidocaine-40mg IV prior to Propofol InductionPropofol given over incremental dosages 

## 2013-07-05 ENCOUNTER — Telehealth: Payer: Self-pay

## 2013-07-05 NOTE — Telephone Encounter (Signed)
Left a message on the answering machine at [phone] for the pt to call back if any questions or concerns. Maw

## 2013-07-09 ENCOUNTER — Ambulatory Visit: Payer: Medicaid Other | Admitting: Adult Health

## 2013-07-12 ENCOUNTER — Telehealth: Payer: Self-pay | Admitting: Oncology

## 2013-07-12 ENCOUNTER — Ambulatory Visit: Payer: Medicaid Other

## 2013-07-12 ENCOUNTER — Other Ambulatory Visit: Payer: Medicaid Other | Admitting: Lab

## 2013-07-12 ENCOUNTER — Telehealth: Payer: Self-pay | Admitting: Pharmacist

## 2013-07-12 NOTE — Telephone Encounter (Signed)
FTKA today for coumadin clinic appointment. Called patient to reschedule. Pt was in too much pain after her wound clinic appointment. Rescheduled to next week on 11/5/14at 10:30am for lab and 10:45am for couamdin clinic

## 2013-07-12 NOTE — Telephone Encounter (Signed)
returned pt call no answer...emailed Gujrati to contact pt to r/s lab and cc

## 2013-07-17 ENCOUNTER — Telehealth: Payer: Self-pay | Admitting: Internal Medicine

## 2013-07-17 NOTE — Telephone Encounter (Signed)
Spoke to pt said she does want to drop her dose so she can get off the medication. She has not been referred to a pain clinic and she doesn't think she needs to go to the pain clinic since she is weaning herself off. But said she is on medicaid and does not have a primary care physician to refer to a pain clinic. If dr. Rhea Belton wants to and feels she needs to go to a pain clinic she will go. Asked the Rx be filled so she can pick it up tomorrow morning.

## 2013-07-18 ENCOUNTER — Ambulatory Visit (HOSPITAL_BASED_OUTPATIENT_CLINIC_OR_DEPARTMENT_OTHER): Payer: Medicaid Other | Admitting: Pharmacist

## 2013-07-18 ENCOUNTER — Other Ambulatory Visit: Payer: Self-pay | Admitting: Gastroenterology

## 2013-07-18 ENCOUNTER — Telehealth: Payer: Self-pay | Admitting: Gastroenterology

## 2013-07-18 ENCOUNTER — Other Ambulatory Visit (HOSPITAL_BASED_OUTPATIENT_CLINIC_OR_DEPARTMENT_OTHER): Payer: Medicaid Other | Admitting: Lab

## 2013-07-18 ENCOUNTER — Encounter (INDEPENDENT_AMBULATORY_CARE_PROVIDER_SITE_OTHER): Payer: Self-pay

## 2013-07-18 DIAGNOSIS — I82403 Acute embolism and thrombosis of unspecified deep veins of lower extremity, bilateral: Secondary | ICD-10-CM

## 2013-07-18 DIAGNOSIS — D6859 Other primary thrombophilia: Secondary | ICD-10-CM

## 2013-07-18 DIAGNOSIS — Z7901 Long term (current) use of anticoagulants: Secondary | ICD-10-CM

## 2013-07-18 LAB — PROTIME-INR: Protime: 22.8 Seconds — ABNORMAL HIGH (ref 10.6–13.4)

## 2013-07-18 MED ORDER — OXYCODONE HCL 5 MG PO TABS: 5.0000 mg | ORAL_TABLET | Freq: Four times a day (QID) | ORAL | Status: DC | PRN

## 2013-07-18 NOTE — Patient Instructions (Signed)
INR at goal Start taking Coumadin 7.5mg  on Weds and  5mg  all other days   Will check PT/INR on 08/01/13 at 10:30am for lab and 10:45 for coumadin clinic.

## 2013-07-18 NOTE — Progress Notes (Signed)
INR slightly below goal today at 1.9. Pt is doing well, but still with leg/foot pain on her left leg from ulcers that are currently being treated/debrided by her wound clinic Pt recently had colonoscopy on 10/22 without any complications She resumed lovenox and coumadin after procedure She stopped lovenox ~ 1 week ago She was experiencing some minor bruising/bleeding on her arms so she altered her coumadin dose on her own She was instructed to take 7.5 mg on Wednesdays and Saturdays and 5 mg all other days but she currently has been taking only 5 mg daily No other changes to diet or medications No recent issues currently regarding bleeding or bruising Plan to slightly increase Coumadin to 7.5mg  on Weds and 5mg  all other days.   Will check PT/INR on 08/01/13 at 10:30am for lab and 10:45 for coumadin clinic.

## 2013-07-18 NOTE — Telephone Encounter (Signed)
Spoke to pt told her Dr. Rhea Belton had concerns if she has gone through 60 pills in 13 days. He will give her 1 more Rx for 5mg  (lower dose) but if she needs more she will have to have her PCP refer her to a pain clinic. Pt verbalized understanding.

## 2013-07-19 ENCOUNTER — Encounter (HOSPITAL_BASED_OUTPATIENT_CLINIC_OR_DEPARTMENT_OTHER): Payer: Medicaid Other | Attending: Internal Medicine

## 2013-07-19 DIAGNOSIS — L97809 Non-pressure chronic ulcer of other part of unspecified lower leg with unspecified severity: Secondary | ICD-10-CM | POA: Insufficient documentation

## 2013-07-19 DIAGNOSIS — D6859 Other primary thrombophilia: Secondary | ICD-10-CM | POA: Insufficient documentation

## 2013-07-19 DIAGNOSIS — I872 Venous insufficiency (chronic) (peripheral): Secondary | ICD-10-CM | POA: Insufficient documentation

## 2013-07-30 ENCOUNTER — Telehealth: Payer: Self-pay | Admitting: Oncology

## 2013-07-30 NOTE — Telephone Encounter (Signed)
, °

## 2013-08-01 ENCOUNTER — Other Ambulatory Visit: Payer: Medicaid Other | Admitting: Lab

## 2013-08-01 ENCOUNTER — Ambulatory Visit: Payer: Medicaid Other

## 2013-08-16 ENCOUNTER — Encounter (HOSPITAL_BASED_OUTPATIENT_CLINIC_OR_DEPARTMENT_OTHER): Payer: Medicaid Other | Attending: Internal Medicine

## 2013-08-16 DIAGNOSIS — I872 Venous insufficiency (chronic) (peripheral): Secondary | ICD-10-CM | POA: Insufficient documentation

## 2013-08-16 DIAGNOSIS — L97809 Non-pressure chronic ulcer of other part of unspecified lower leg with unspecified severity: Secondary | ICD-10-CM | POA: Insufficient documentation

## 2013-08-16 DIAGNOSIS — D6859 Other primary thrombophilia: Secondary | ICD-10-CM | POA: Insufficient documentation

## 2013-09-03 ENCOUNTER — Telehealth: Payer: Self-pay | Admitting: Pharmacist

## 2013-09-16 ENCOUNTER — Other Ambulatory Visit: Payer: Self-pay | Admitting: Internal Medicine

## 2013-09-17 ENCOUNTER — Telehealth: Payer: Self-pay | Admitting: Internal Medicine

## 2013-09-17 NOTE — Telephone Encounter (Signed)
Due to [place] Access this patient had to switch to the [facility] provider on her card.  She will start Medicare in March and would like to re-establish in March with Dr. Ronnald Ramp.  Is this OK?

## 2013-09-17 NOTE — Telephone Encounter (Signed)
Pt is aware.  

## 2013-09-17 NOTE — Telephone Encounter (Signed)
No. I am not available for that.  Alona Bene

## 2013-09-20 ENCOUNTER — Encounter (HOSPITAL_BASED_OUTPATIENT_CLINIC_OR_DEPARTMENT_OTHER): Payer: Medicaid Other | Attending: Internal Medicine

## 2013-09-20 DIAGNOSIS — D6859 Other primary thrombophilia: Secondary | ICD-10-CM | POA: Insufficient documentation

## 2013-09-20 DIAGNOSIS — I87339 Chronic venous hypertension (idiopathic) with ulcer and inflammation of unspecified lower extremity: Secondary | ICD-10-CM | POA: Insufficient documentation

## 2013-09-20 DIAGNOSIS — L97909 Non-pressure chronic ulcer of unspecified part of unspecified lower leg with unspecified severity: Principal | ICD-10-CM

## 2013-09-20 DIAGNOSIS — I872 Venous insufficiency (chronic) (peripheral): Secondary | ICD-10-CM | POA: Insufficient documentation

## 2013-10-05 ENCOUNTER — Ambulatory Visit: Payer: Medicaid Other | Admitting: Oncology

## 2013-10-05 ENCOUNTER — Other Ambulatory Visit: Payer: Medicaid Other

## 2013-10-17 ENCOUNTER — Telehealth: Payer: Self-pay | Admitting: Pharmacist

## 2013-10-18 ENCOUNTER — Encounter (HOSPITAL_BASED_OUTPATIENT_CLINIC_OR_DEPARTMENT_OTHER): Payer: Medicaid Other | Attending: Internal Medicine

## 2013-10-18 DIAGNOSIS — I87339 Chronic venous hypertension (idiopathic) with ulcer and inflammation of unspecified lower extremity: Secondary | ICD-10-CM | POA: Insufficient documentation

## 2013-10-18 DIAGNOSIS — L97909 Non-pressure chronic ulcer of unspecified part of unspecified lower leg with unspecified severity: Principal | ICD-10-CM

## 2013-10-25 ENCOUNTER — Other Ambulatory Visit (HOSPITAL_COMMUNITY): Payer: Self-pay | Admitting: Psychiatry

## 2013-10-25 ENCOUNTER — Ambulatory Visit (HOSPITAL_COMMUNITY)
Admission: RE | Admit: 2013-10-25 | Discharge: 2013-10-25 | Disposition: A | Discharge status: Home / Self Care | Payer: Medicaid Other | Source: Ambulatory Visit | Attending: Psychiatry | Admitting: Psychiatry

## 2013-10-25 DIAGNOSIS — M542 Cervicalgia: Secondary | ICD-10-CM

## 2013-10-25 DIAGNOSIS — M25519 Pain in unspecified shoulder: Secondary | ICD-10-CM | POA: Insufficient documentation

## 2013-10-25 DIAGNOSIS — M47812 Spondylosis without myelopathy or radiculopathy, cervical region: Secondary | ICD-10-CM | POA: Insufficient documentation

## 2013-10-29 ENCOUNTER — Telehealth: Payer: Self-pay | Admitting: Pharmacist

## 2013-10-30 ENCOUNTER — Other Ambulatory Visit: Payer: Medicaid Other

## 2013-10-30 ENCOUNTER — Ambulatory Visit: Payer: Medicaid Other

## 2013-10-30 ENCOUNTER — Ambulatory Visit: Payer: Medicaid Other | Admitting: Internal Medicine

## 2013-11-15 ENCOUNTER — Encounter (HOSPITAL_BASED_OUTPATIENT_CLINIC_OR_DEPARTMENT_OTHER): Payer: Medicare Other | Attending: Internal Medicine

## 2013-11-15 DIAGNOSIS — L97909 Non-pressure chronic ulcer of unspecified part of unspecified lower leg with unspecified severity: Principal | ICD-10-CM

## 2013-11-15 DIAGNOSIS — I87339 Chronic venous hypertension (idiopathic) with ulcer and inflammation of unspecified lower extremity: Secondary | ICD-10-CM | POA: Insufficient documentation

## 2013-11-15 DIAGNOSIS — I872 Venous insufficiency (chronic) (peripheral): Secondary | ICD-10-CM | POA: Insufficient documentation

## 2013-11-15 DIAGNOSIS — L97809 Non-pressure chronic ulcer of other part of unspecified lower leg with unspecified severity: Secondary | ICD-10-CM | POA: Insufficient documentation

## 2013-11-22 DIAGNOSIS — I87339 Chronic venous hypertension (idiopathic) with ulcer and inflammation of unspecified lower extremity: Secondary | ICD-10-CM | POA: Diagnosis not present

## 2013-11-22 DIAGNOSIS — I872 Venous insufficiency (chronic) (peripheral): Secondary | ICD-10-CM | POA: Diagnosis not present

## 2013-11-22 DIAGNOSIS — L97809 Non-pressure chronic ulcer of other part of unspecified lower leg with unspecified severity: Secondary | ICD-10-CM | POA: Diagnosis not present

## 2013-11-29 DIAGNOSIS — I87339 Chronic venous hypertension (idiopathic) with ulcer and inflammation of unspecified lower extremity: Secondary | ICD-10-CM | POA: Diagnosis not present

## 2013-11-29 DIAGNOSIS — L97809 Non-pressure chronic ulcer of other part of unspecified lower leg with unspecified severity: Secondary | ICD-10-CM | POA: Diagnosis not present

## 2013-11-29 DIAGNOSIS — L97909 Non-pressure chronic ulcer of unspecified part of unspecified lower leg with unspecified severity: Secondary | ICD-10-CM | POA: Diagnosis not present

## 2013-11-29 DIAGNOSIS — I872 Venous insufficiency (chronic) (peripheral): Secondary | ICD-10-CM | POA: Diagnosis not present

## 2013-11-30 ENCOUNTER — Encounter: Payer: Self-pay | Admitting: Internal Medicine

## 2013-11-30 ENCOUNTER — Ambulatory Visit (INDEPENDENT_AMBULATORY_CARE_PROVIDER_SITE_OTHER): Payer: Medicare Other | Admitting: Internal Medicine

## 2013-11-30 VITALS — BP 176/100 | HR 59 | Ht 63.0 in | Wt 178.9 lb

## 2013-11-30 DIAGNOSIS — I83009 Varicose veins of unspecified lower extremity with ulcer of unspecified site: Secondary | ICD-10-CM | POA: Diagnosis not present

## 2013-11-30 DIAGNOSIS — I83893 Varicose veins of bilateral lower extremities with other complications: Secondary | ICD-10-CM | POA: Insufficient documentation

## 2013-11-30 DIAGNOSIS — L97909 Non-pressure chronic ulcer of unspecified part of unspecified lower leg with unspecified severity: Secondary | ICD-10-CM

## 2013-11-30 NOTE — Patient Instructions (Signed)
You have been referred to Dr. Donnetta Hutching at [facility].

## 2013-11-30 NOTE — Progress Notes (Signed)
[facility]    OFFICE NOTE  Chief Complaint:  Non-healing ulcer  Primary Care Physician: No PCP Per Patient  HPI:  ZANYIAH POSTEN is a pleasant 58 year old female who is coming referred by Dr. Dellia Nims for evaluation of a nonhealing venous stasis ulcer on the left lower leg. Her past medical history is significant for varicose veins although she's never had any treatment. Her father apparently had very severe varicose veins and numerous treatments in the past. She also has a history of superficial thrombophlebitis and possibly DVT for which she is on chronic warfarin therapy. She reports that over the past several years she was being followed by the wound care clinic for nonhealing wound. She has significant venous stasis changes and is referred for evaluation and management of varicose veins and nonhealing ulcer. She reports that she's been wearing compression stockings and has had tight compression wraps. She does have some discomfort in both legs as well as heaviness and achiness of the legs.  PMHx:  Past Medical History  Diagnosis Date  . Tobacco use disorder   . Unspecified urinary incontinence   . Complete rupture of rotator cuff   . Acute venous embolism and thrombosis of unspecified deep vessels of lower extremity   . Calculus of kidney   . Degeneration of intervertebral disc, site unspecified   . Painful respiration   . Other and unspecified noninfectious gastroenteritis and colitis(558.9)   . HTN (hypertension)   . Family history of ischemic heart disease   . Anxiety state, unspecified   . HLD (hyperlipidemia)   . Primary hypercoagulable state   . Arthritis   . COPD (chronic obstructive pulmonary disease)   . Depression   . History of gallstones   . Protein S deficiency   . Colitis   . Renal failure   . Blood dyscrasia     protein def.  . Venous stasis ulcers     left leg    Past Surgical History  Procedure Laterality Date  . Thrombectomy / embolectomy femoral artery     DVT  . Nasal sinus surgery    . Tubal ligation    . Rotator cuff repair      right  . Tubal ligation reversal    . Colonoscopy  04/27/2012    Procedure: COLONOSCOPY;  Surgeon: Jerene Bears, MD;  Location: [facility];  Service: Gastroenterology;  Laterality: N/A;    FAMHx:  Family History  Problem Relation Age of Onset  . Breast cancer Mother     mets to liver  . Stroke Mother   . Heart attack Father   . Lung cancer Father   . Other Father     Protein S Deficiency  . Clotting disorder Father   . Diabetes Father   . Heart disease Father   . Cancer Father     Lung  . Hypertension Maternal Grandmother   . Colon cancer Maternal Grandmother   . Heart disease Maternal Grandmother   . Heart attack Paternal Grandmother   . Diabetes Sister   . Stomach cancer Neg Hx   . Alzheimer's disease Maternal Grandfather   . Tuberculosis Paternal Grandmother   . Hypertension Brother   . Ulcers Brother     SOCHx:   reports that she has been smoking Cigarettes.  She has a 15 pack-year smoking history. She has never used smokeless tobacco. She reports that she does not drink alcohol or use illicit drugs.  ALLERGIES:  Allergies  Allergen Reactions  .  Butalbital-Apap-Caffeine Hives  . Naproxen Hives  . Tylenol [Acetaminophen] Other (See Comments)    Upset stomach    ROS: A comprehensive review of systems was negative except for: Cardiovascular: positive for lower extremity edema and venous stasis ulcer, partially healed  HOME MEDS: Current Outpatient Prescriptions  Medication Sig Dispense Refill  . amLODipine (NORVASC) 10 MG tablet take 1 tablet by mouth once daily  90 tablet  3  . celecoxib (CELEBREX) 200 MG capsule Take 200 mg by mouth daily.      . diclofenac sodium (VOLTAREN) 1 % GEL Apply 1 application topically 4 (four) times daily as needed. For neck pain.  100 g  11  . diphenoxylate-atropine (LOMOTIL) 2.5-0.025 MG per tablet Take 1 tablet by mouth 4 (four) times daily as  needed for diarrhea or loose stools.  120 tablet  1  . Fluticasone-Salmeterol (ADVAIR HFA IN) Inhale 1 puff into the lungs at bedtime as needed.      . hyoscyamine (LEVSIN SL) 0.125 MG SL tablet Place 1 tablet (0.125 mg total) under the tongue every 4 (four) hours as needed for cramping.  30 tablet  3  . oxyCODONE (OXY IR/ROXICODONE) 5 MG immediate release tablet Take 10 mg by mouth every 6 (six) hours as needed for severe pain.      . pantoprazole (PROTONIX) 40 MG tablet Take 40 mg by mouth daily.      . Tiotropium Bromide Monohydrate (SPIRIVA HANDIHALER IN) Inhale into the lungs daily.      [facility]land Kitchen warfarin (COUMADIN) 5 MG tablet Take 1-1.5 tablets (5-7.5 mg total) by mouth daily. Titrate as directed based on INR and MD recommendation.  60 tablet  3   No current facility-administered medications for this visit.    LABS/IMAGING: No results found for this or any previous visit (from the past 48 hour(s)). No results found.  VITALS: BP 176/100  Pulse 59  Ht 5\' 3"  (1.6 m)  Wt 178 lb 14.4 oz (81.149 kg)  BMI 31.70 kg/m2  EXAM: General appearance: alert and no distress Extremities: edema trace, varicose veins noted, venous stasis dermatitis noted and healed venous stasis ulcer in the left lower leg Pulses: 2+ and symmetric Skin: hemosiderin discolartion, CEAP 1,2,3,4a,5 (on left)  EKG: deferred  ASSESSMENT: 1. Bilateral venous insufficiency 2. Healed venous ulcer of the left leg 3. History of superficial and deep vein thrombosis on warfarin 4. CEAP 1, 2, 3, 4A, 5 (on the left)  PLAN: 1.   Mrs. Whitener has significant venous disease of both legs. She was found to have superficial thrombophlebitis in the past. She does need venous insufficiency ultrasounds as she is likely a good candidate for venous ablation or possibly stab phlebectomy. Unfortunately, I am no longer performing vascular procedures. I will refer her to vascular and vein surgery, specifically to Dr. Sherren Mocha Early for a venous  insufficiency study and further management.    Thank you for your kind referral.  Pixie Casino, MD, [facility] Attending Cardiologist [facility]  HILTY,Kenneth C 11/30/2013, 11:57 AM

## 2013-12-03 DIAGNOSIS — F3289 Other specified depressive episodes: Secondary | ICD-10-CM | POA: Diagnosis not present

## 2013-12-03 DIAGNOSIS — F329 Major depressive disorder, single episode, unspecified: Secondary | ICD-10-CM | POA: Diagnosis not present

## 2013-12-03 DIAGNOSIS — F411 Generalized anxiety disorder: Secondary | ICD-10-CM | POA: Diagnosis not present

## 2013-12-03 DIAGNOSIS — F319 Bipolar disorder, unspecified: Secondary | ICD-10-CM | POA: Diagnosis not present

## 2013-12-04 ENCOUNTER — Other Ambulatory Visit: Payer: Self-pay | Admitting: *Deleted

## 2013-12-04 DIAGNOSIS — I83893 Varicose veins of bilateral lower extremities with other complications: Secondary | ICD-10-CM

## 2013-12-20 ENCOUNTER — Ambulatory Visit: Payer: Medicare Other

## 2013-12-20 ENCOUNTER — Other Ambulatory Visit: Payer: Medicare Other

## 2013-12-24 ENCOUNTER — Encounter: Payer: Self-pay | Admitting: Internal Medicine

## 2013-12-24 ENCOUNTER — Ambulatory Visit (HOSPITAL_BASED_OUTPATIENT_CLINIC_OR_DEPARTMENT_OTHER): Payer: Medicare Other | Admitting: Pharmacist

## 2013-12-24 ENCOUNTER — Other Ambulatory Visit (HOSPITAL_BASED_OUTPATIENT_CLINIC_OR_DEPARTMENT_OTHER): Payer: Medicare Other

## 2013-12-24 ENCOUNTER — Ambulatory Visit: Payer: Medicaid Other | Admitting: Physician Assistant

## 2013-12-24 ENCOUNTER — Other Ambulatory Visit (INDEPENDENT_AMBULATORY_CARE_PROVIDER_SITE_OTHER): Payer: Medicare Other

## 2013-12-24 ENCOUNTER — Telehealth: Payer: Self-pay | Admitting: Internal Medicine

## 2013-12-24 ENCOUNTER — Ambulatory Visit (INDEPENDENT_AMBULATORY_CARE_PROVIDER_SITE_OTHER): Payer: Medicare Other | Admitting: Internal Medicine

## 2013-12-24 VITALS — BP 142/94 | HR 96 | Temp 98.5°F | Resp 16 | Ht 63.0 in | Wt 188.0 lb

## 2013-12-24 DIAGNOSIS — I83893 Varicose veins of bilateral lower extremities with other complications: Secondary | ICD-10-CM

## 2013-12-24 DIAGNOSIS — D6859 Other primary thrombophilia: Secondary | ICD-10-CM

## 2013-12-24 DIAGNOSIS — J42 Unspecified chronic bronchitis: Secondary | ICD-10-CM | POA: Diagnosis not present

## 2013-12-24 DIAGNOSIS — F341 Dysthymic disorder: Secondary | ICD-10-CM

## 2013-12-24 DIAGNOSIS — I82409 Acute embolism and thrombosis of unspecified deep veins of unspecified lower extremity: Secondary | ICD-10-CM | POA: Diagnosis not present

## 2013-12-24 DIAGNOSIS — I82403 Acute embolism and thrombosis of unspecified deep veins of lower extremity, bilateral: Secondary | ICD-10-CM

## 2013-12-24 DIAGNOSIS — E7800 Pure hypercholesterolemia, unspecified: Secondary | ICD-10-CM | POA: Diagnosis not present

## 2013-12-24 DIAGNOSIS — I10 Essential (primary) hypertension: Secondary | ICD-10-CM

## 2013-12-24 DIAGNOSIS — F418 Other specified anxiety disorders: Secondary | ICD-10-CM

## 2013-12-24 DIAGNOSIS — Z7901 Long term (current) use of anticoagulants: Secondary | ICD-10-CM | POA: Diagnosis not present

## 2013-12-24 DIAGNOSIS — M4692 Unspecified inflammatory spondylopathy, cervical region: Secondary | ICD-10-CM

## 2013-12-24 DIAGNOSIS — M5412 Radiculopathy, cervical region: Secondary | ICD-10-CM

## 2013-12-24 DIAGNOSIS — M4680 Other specified inflammatory spondylopathies, site unspecified: Secondary | ICD-10-CM

## 2013-12-24 DIAGNOSIS — IMO0002 Reserved for concepts with insufficient information to code with codable children: Secondary | ICD-10-CM

## 2013-12-24 LAB — URINALYSIS, ROUTINE W REFLEX MICROSCOPIC
Bilirubin Urine: NEGATIVE
Ketones, ur: NEGATIVE
Leukocytes, UA: NEGATIVE
NITRITE: NEGATIVE
Specific Gravity, Urine: 1.01 (ref 1.000–1.030)
Total Protein, Urine: NEGATIVE
Urine Glucose: NEGATIVE
Urobilinogen, UA: 0.2 (ref 0.0–1.0)
pH: 6 (ref 5.0–8.0)

## 2013-12-24 LAB — PROTIME-INR
INR: 1.6 — ABNORMAL LOW (ref 2.00–3.50)
PROTIME: 19.2 s — AB (ref 10.6–13.4)

## 2013-12-24 LAB — COMPREHENSIVE METABOLIC PANEL
ALBUMIN: 4 g/dL (ref 3.5–5.2)
ALK PHOS: 89 U/L (ref 39–117)
ALT: 25 U/L (ref 0–35)
AST: 32 U/L (ref 0–37)
BUN: 10 mg/dL (ref 6–23)
CO2: 25 mEq/L (ref 19–32)
Calcium: 8.9 mg/dL (ref 8.4–10.5)
Chloride: 104 mEq/L (ref 96–112)
Creatinine, Ser: 0.7 mg/dL (ref 0.4–1.2)
GFR: 87.22 mL/min (ref >60.00)
GLUCOSE: 92 mg/dL (ref 70–99)
POTASSIUM: 3.6 meq/L (ref 3.5–5.1)
Sodium: 138 mEq/L (ref 135–145)
Total Bilirubin: 0.9 mg/dL (ref 0.3–1.2)
Total Protein: 7.8 g/dL (ref 6.0–8.3)

## 2013-12-24 LAB — CBC WITH DIFFERENTIAL/PLATELET
BASOS PCT: 0.6 % (ref 0.0–3.0)
Basophils Absolute: 0 10*3/uL (ref 0.0–0.1)
Eosinophils Absolute: 0.3 10*3/uL (ref 0.0–0.7)
Eosinophils Relative: 3.8 % (ref 0.0–5.0)
HCT: 42.9 % (ref 36.0–46.0)
Hemoglobin: 14.7 g/dL (ref 12.0–15.0)
LYMPHS PCT: 38.3 % (ref 12.0–46.0)
Lymphs Abs: 3.2 10*3/uL (ref 0.7–4.0)
MCHC: 34.3 g/dL (ref 30.0–36.0)
MCV: 94.1 fl (ref 78.0–100.0)
MONO ABS: 0.6 10*3/uL (ref 0.1–1.0)
Monocytes Relative: 7.7 % (ref 3.0–12.0)
NEUTROS PCT: 49.6 % (ref 43.0–77.0)
Neutro Abs: 4.1 10*3/uL (ref 1.4–7.7)
Platelets: 229 10*3/uL (ref 150.0–400.0)
RBC: 4.57 Mil/uL (ref 3.87–5.11)
RDW: 12.5 % (ref 11.5–14.6)
WBC: 8.3 10*3/uL (ref 4.5–10.5)

## 2013-12-24 LAB — LIPID PANEL
Cholesterol: 172 mg/dL (ref 0–200)
HDL: 50.2 mg/dL (ref >39.00)
LDL CALC: 104 mg/dL — AB (ref 0–99)
Total CHOL/HDL Ratio: 3
Triglycerides: 88 mg/dL (ref 0.0–149.0)
VLDL: 17.6 mg/dL (ref 0.0–40.0)

## 2013-12-24 LAB — POCT INR: INR: 1.6

## 2013-12-24 LAB — TSH: TSH: 1.44 u[IU]/mL (ref 0.35–5.50)

## 2013-12-24 MED ORDER — GABAPENTIN 600 MG PO TABS: 600.0000 mg | ORAL_TABLET | Freq: Three times a day (TID) | ORAL | Status: DC

## 2013-12-24 MED ORDER — WARFARIN SODIUM 5 MG PO TABS: 5.0000 mg | ORAL_TABLET | Freq: Every day | ORAL | Status: DC

## 2013-12-24 MED ORDER — AMLODIPINE BESYLATE 10 MG PO TABS: ORAL_TABLET | ORAL | Status: DC

## 2013-12-24 MED ORDER — MIRTAZAPINE 30 MG PO TABS: 30.0000 mg | ORAL_TABLET | Freq: Every day | ORAL | Status: DC

## 2013-12-24 MED ORDER — PANTOPRAZOLE SODIUM 40 MG PO TBEC: 40.0000 mg | DELAYED_RELEASE_TABLET | Freq: Every day | ORAL | Status: DC

## 2013-12-24 MED ORDER — CELECOXIB 200 MG PO CAPS: 200.0000 mg | ORAL_CAPSULE | Freq: Two times a day (BID) | ORAL | Status: DC

## 2013-12-24 MED ORDER — ALPRAZOLAM 2 MG PO TABS: 2.0000 mg | ORAL_TABLET | Freq: Every evening | ORAL | Status: DC | PRN

## 2013-12-24 NOTE — Assessment & Plan Note (Signed)
FLP CMP and TSH today

## 2013-12-24 NOTE — Assessment & Plan Note (Signed)
Improvement noted 

## 2013-12-24 NOTE — Progress Notes (Signed)
Pt seen for first time in clinic since November INR=1.6 on 5mg  daily with 7.5 mg on Wed She has no new changes to report, she has had to switch MD's and Pharmacies due to insurance (now on [employer]) RX sent over to [facility] at [place] and [facility]ella #60 5mg  tabs with 3 refills She is anticipating some surgery/procedure the end of May to re-route some veins She is currently not on lovenox but is aware of it and the process prior to procedures Increase her dose to 7.5 mg on MWF and 5 mg other days RTC in 2 weeks, 01/07/14 at 2pm

## 2013-12-24 NOTE — Progress Notes (Signed)
Pre visit review using our clinic review tool, if applicable. No additional management support is needed unless otherwise documented below in the visit note. 

## 2013-12-24 NOTE — Progress Notes (Signed)
Subjective:    Patient ID: Brooke Garcia, female    DOB: 1956-08-08, 58 y.o.   MRN: [id number]  Hypertension This is a chronic problem. The current episode started more than 1 year ago. The problem has been gradually worsening since onset. The problem is uncontrolled. Associated symptoms include anxiety and neck pain. Pertinent negatives include no blurred vision, chest pain, headaches, malaise/fatigue, orthopnea, palpitations, peripheral edema, PND, shortness of breath or sweats. Agents associated with hypertension include NSAIDs. Risk factors for coronary artery disease include smoking/tobacco exposure. Treatments tried: she has been out of meds for one month. The current treatment provides no improvement. Compliance problems include exercise and diet.  Hypertensive end-organ damage includes kidney disease. Identifiable causes of hypertension include chronic renal disease.      Review of Systems  Constitutional: Negative.  Negative for fever, chills, malaise/fatigue, diaphoresis, appetite change and fatigue.  HENT: Negative.   Eyes: Negative.  Negative for blurred vision.  Respiratory: Negative.  Negative for cough, choking, chest tightness, shortness of breath and stridor.   Cardiovascular: Negative.  Negative for chest pain, palpitations, orthopnea, leg swelling and PND.  Gastrointestinal: Negative.  Negative for nausea, vomiting, abdominal pain, diarrhea, constipation and blood in stool.  Endocrine: Negative.   Genitourinary: Negative.   Musculoskeletal: Positive for arthralgias and neck pain. Negative for back pain, gait problem, joint swelling, myalgias and neck stiffness.  Skin: Negative.   Allergic/Immunologic: Negative.   Neurological: Negative.  Negative for headaches.  Hematological: Negative.  Negative for adenopathy. Does not bruise/bleed easily.  Psychiatric/Behavioral: Positive for dysphoric mood. Negative for suicidal ideas, hallucinations, behavioral problems, confusion,  sleep disturbance, self-injury, decreased concentration and agitation. The patient is nervous/anxious. The patient is not hyperactive.        Objective:   Physical Exam  Vitals reviewed. Constitutional: She is oriented to person, place, and time. She appears well-developed and well-nourished. No distress.  HENT:  Head: Normocephalic and atraumatic.  Mouth/Throat: Oropharynx is clear and moist. No oropharyngeal exudate.  Eyes: Conjunctivae are normal. Right eye exhibits no discharge. Left eye exhibits no discharge. No scleral icterus.  Neck: Normal range of motion. Neck supple. No JVD present. No tracheal deviation present. No thyromegaly present.  Cardiovascular: Normal rate, regular rhythm, normal heart sounds and intact distal pulses.  Exam reveals no gallop and no friction rub.   No murmur heard. Pulmonary/Chest: Effort normal and breath sounds normal. No stridor. No respiratory distress. She has no wheezes. She has no rales. She exhibits no tenderness.  Abdominal: Soft. Bowel sounds are normal. She exhibits no distension and no mass. There is no tenderness. There is no rebound and no guarding.  Musculoskeletal: Normal range of motion. She exhibits edema (trace pitting edema in BLE). She exhibits no tenderness.  Lymphadenopathy:    She has no cervical adenopathy.  Neurological: She is oriented to person, place, and time.  Skin: Skin is warm and dry. No rash noted. She is not diaphoretic. No erythema. No pallor.  Psychiatric: She has a normal mood and affect. Her behavior is normal. Judgment and thought content normal.     Lab Results  Component Value Date   WBC 8.8 06/15/2013   HGB 14.9 06/15/2013   HCT 43.4 06/15/2013   PLT 272.0 06/15/2013   GLUCOSE 116 04/10/2013   CHOL 188 07/04/2007   TRIG 52 07/04/2007   HDL 66.6 07/04/2007   LDLCALC 111* 07/04/2007   ALT 27 07/17/2012   AST 49* 07/17/2012   NA 142 04/10/2013  K 3.9 04/10/2013   CL 104 07/17/2012   CREATININE 0.8 04/10/2013     BUN 7.4 04/10/2013   CO2 29 04/10/2013   TSH 2.516 03/29/2012   INR 1.6 12/24/2013       Assessment & Plan:

## 2013-12-24 NOTE — Assessment & Plan Note (Signed)
Her BP is not well controlled, she will restart amlodipine Today I will check her labs to look for end organ damage and secondary causes of HTN

## 2013-12-24 NOTE — Patient Instructions (Signed)
Coumadin 7.5mg  on Mon,Wed and Fri and 5mg  all other days. Will check PT/INR on 01/07/14 at 2:00pm for lab and 2:15pm for coumadin clinic.

## 2013-12-24 NOTE — Telephone Encounter (Signed)
Relevant patient education assigned to patient using Emmi. ° °

## 2013-12-24 NOTE — Assessment & Plan Note (Signed)
She wants to continue on xanax She will cont remeron as well

## 2013-12-24 NOTE — Patient Instructions (Signed)

## 2013-12-25 ENCOUNTER — Telehealth: Payer: Self-pay | Admitting: Internal Medicine

## 2013-12-25 ENCOUNTER — Encounter: Payer: Self-pay | Admitting: Internal Medicine

## 2013-12-25 NOTE — Telephone Encounter (Signed)
Patient is calling in regards to her Xanax rx she picked up yesterday during her OV. She states that the directions on the paper script say to "Take 1 tablet (2 mg total) by mouth at bedtime as needed for sleep" however it needs to say "take 3x per day." Patient does not want to have to come back to the office to pick up a new script. Wants to know if she can take her paper script to her pharmacy and we call the pharmacy to have them change the directions. Please advise patient on what she should do.

## 2013-12-26 ENCOUNTER — Other Ambulatory Visit: Payer: Self-pay | Admitting: Internal Medicine

## 2013-12-26 DIAGNOSIS — F418 Other specified anxiety disorders: Secondary | ICD-10-CM

## 2013-12-26 MED ORDER — ALPRAZOLAM 1 MG PO TABS: ORAL_TABLET | ORAL | Status: DC

## 2013-12-27 DIAGNOSIS — M542 Cervicalgia: Secondary | ICD-10-CM | POA: Diagnosis not present

## 2013-12-27 DIAGNOSIS — M79609 Pain in unspecified limb: Secondary | ICD-10-CM | POA: Diagnosis not present

## 2013-12-27 DIAGNOSIS — M5030 Other cervical disc degeneration, unspecified cervical region: Secondary | ICD-10-CM | POA: Diagnosis not present

## 2014-01-07 ENCOUNTER — Other Ambulatory Visit: Payer: Medicare Other

## 2014-01-07 ENCOUNTER — Ambulatory Visit: Payer: Medicare Other

## 2014-01-09 ENCOUNTER — Encounter: Payer: Self-pay | Admitting: Vascular Surgery

## 2014-01-10 ENCOUNTER — Encounter: Payer: Medicare Other | Admitting: Vascular Surgery

## 2014-01-10 ENCOUNTER — Encounter: Payer: Self-pay | Admitting: Vascular Surgery

## 2014-01-10 ENCOUNTER — Ambulatory Visit (INDEPENDENT_AMBULATORY_CARE_PROVIDER_SITE_OTHER): Payer: Medicare Other | Admitting: Vascular Surgery

## 2014-01-10 ENCOUNTER — Ambulatory Visit (HOSPITAL_COMMUNITY)
Admission: RE | Admit: 2014-01-10 | Discharge: 2014-01-10 | Disposition: A | Discharge status: Home / Self Care | Payer: Medicare Other | Source: Ambulatory Visit | Attending: Vascular Surgery | Admitting: Vascular Surgery

## 2014-01-10 ENCOUNTER — Other Ambulatory Visit: Payer: Self-pay | Admitting: Vascular Surgery

## 2014-01-10 ENCOUNTER — Encounter (HOSPITAL_COMMUNITY): Payer: Medicare Other

## 2014-01-10 VITALS — BP 126/71 | HR 73 | Ht 63.0 in | Wt 185.0 lb

## 2014-01-10 DIAGNOSIS — I83893 Varicose veins of bilateral lower extremities with other complications: Secondary | ICD-10-CM | POA: Diagnosis not present

## 2014-01-10 DIAGNOSIS — L97909 Non-pressure chronic ulcer of unspecified part of unspecified lower leg with unspecified severity: Secondary | ICD-10-CM

## 2014-01-10 NOTE — Progress Notes (Signed)
Referred by: Mali Hilty, MD  Reason for referral: Swollen left > right leg with venous ulcer exacerbations since age 58.  History of Present Illness  Brooke Garcia is a 58 y.o. (1955/09/25) female who presents with chief complaint: swollen leg.  Patient notes, onset of swelling 18 months ago, associated with ulcers in the posterior left calf.  The patient's symptoms include: swelling and ulcer formation.  The patient has had a history of DVT,  history of pregnancy,  history of varicose vein,  history of venous stasis ulcers, no history of  Lymphedema and a history of skin changes in lower legs.  There is a family history of venous disorders and Protein S defiecency.  The patient has  used compression stockings in the past. The patient has been on chronic Coumadin therapy since her DVT at age 30. She has had multiple exacerbations and remissions of venous stasis ulcers in the left leg.  Past Medical History  Diagnosis Date  . Tobacco use disorder   . Unspecified urinary incontinence   . Complete rupture of rotator cuff   . Acute venous embolism and thrombosis of unspecified deep vessels of lower extremity   . Calculus of kidney   . Degeneration of intervertebral disc, site unspecified   . Painful respiration   . Other and unspecified noninfectious gastroenteritis and colitis(558.9)   . HTN (hypertension)   . Family history of ischemic heart disease   . Anxiety state, unspecified   . HLD (hyperlipidemia)   . Primary hypercoagulable state   . Arthritis   . COPD (chronic obstructive pulmonary disease)   . Depression   . History of gallstones   . Protein S deficiency   . Colitis   . Renal failure   . Blood dyscrasia     protein def.  . Venous stasis ulcers     left leg  . DVT (deep venous thrombosis)     Past Surgical History  Procedure Laterality Date  . Thrombectomy / embolectomy femoral artery      DVT  . Nasal sinus surgery    . Tubal ligation    . Rotator cuff repair       right  . Tubal ligation reversal    . Colonoscopy  04/27/2012    Procedure: COLONOSCOPY;  Surgeon: Jerene Bears, MD;  Location: [facility];  Service: Gastroenterology;  Laterality: N/A;    History   Social History  . Marital Status: Single    Spouse Name: N/A    Number of Children: 2  . Years of Education: 12+   Occupational History  . real estate, Advertising account executive, [employer]    Social History Main Topics  . Smoking status: Current Every Day Smoker -- 0.50 packs/day for 30 years    Types: Cigarettes  . Smokeless tobacco: Never Used     Comment: 0.5 packs per day  . Alcohol Use: No  . Drug Use: No  . Sexual Activity: Not Currently    Birth Control/ Protection: Post-menopausal   Other Topics Concern  . Not on file   Social History Narrative   Married.   Occupation: currently disabled    Family History  Problem Relation Age of Onset  . Breast cancer Mother     mets to liver  . Stroke Mother   . Heart attack Father   . Lung cancer Father   . Other Father     Protein S Deficiency  . Clotting disorder Father   .  Diabetes Father   . Heart disease Father   . Cancer Father     Lung  . Hypertension Maternal Grandmother   . Colon cancer Maternal Grandmother   . Heart disease Maternal Grandmother   . Heart attack Paternal Grandmother   . Diabetes Sister   . Stomach cancer Neg Hx   . Alzheimer's disease Maternal Grandfather   . Tuberculosis Paternal Grandmother   . Hypertension Brother   . Ulcers Brother       Current Outpatient Prescriptions on File Prior to Visit  Medication Sig Dispense Refill  . ALPRAZolam (XANAX) 1 MG tablet take 1 tablet by mouth three times a day  90 tablet  2  . amLODipine (NORVASC) 10 MG tablet take 1 tablet by mouth once daily  90 tablet  3  . celecoxib (CELEBREX) 200 MG capsule Take 1 capsule (200 mg total) by mouth 2 (two) times daily.  180 capsule  1  . diclofenac sodium (VOLTAREN) 1 % GEL Apply 1 application topically 4 (four) times  daily as needed. For neck pain.  100 g  11  . diphenoxylate-atropine (LOMOTIL) 2.5-0.025 MG per tablet Take 1 tablet by mouth 4 (four) times daily as needed for diarrhea or loose stools.  120 tablet  1  . Fluticasone-Salmeterol (ADVAIR HFA IN) Inhale 1 puff into the lungs at bedtime as needed.      . gabapentin (NEURONTIN) 600 MG tablet Take 1 tablet (600 mg total) by mouth 3 (three) times daily.  270 tablet  5  . hyoscyamine (LEVSIN SL) 0.125 MG SL tablet Place 1 tablet (0.125 mg total) under the tongue every 4 (four) hours as needed for cramping.  30 tablet  3  . mirtazapine (REMERON) 30 MG tablet Take 1 tablet (30 mg total) by mouth at bedtime.  90 tablet  3  . oxyCODONE (OXY IR/ROXICODONE) 5 MG immediate release tablet Take 10 mg by mouth every 6 (six) hours as needed for severe pain.      . pantoprazole (PROTONIX) 40 MG tablet Take 1 tablet (40 mg total) by mouth daily.  90 tablet  3  . Tiotropium Bromide Monohydrate (SPIRIVA HANDIHALER IN) Inhale into the lungs daily.      Marland Kitchen warfarin (COUMADIN) 5 MG tablet Take 1-1.5 tablets (5-7.5 mg total) by mouth daily. Titrate as directed based on INR and MD recommendation.  60 tablet  3   No current facility-administered medications on file prior to visit.    Allergies  Allergen Reactions  . Butalbital-Apap-Caffeine Hives  . Naproxen Hives  . Tylenol [Acetaminophen] Other (See Comments)    Upset stomach      REVIEW OF SYSTEMS:  (Positives checked otherwise negative)  CARDIOVASCULAR:  []  chest pain, []  chest pressure, []  palpitations, []  shortness of breath when laying flat, []  shortness of breath with exertion,  [x]  pain in feet when walking, []  pain in feet when laying flat, [x]  history of blood clot in veins (DVT), [x]  history of phlebitis, [x]  swelling in legs, [x]  varicose veins  PULMONARY:  []  productive cough, []  asthma, []  wheezing  NEUROLOGIC:  []  weakness in arms or legs, []  numbness in arms or legs, []  difficulty speaking or  slurred speech, []  temporary loss of vision in one eye, []  dizziness  HEMATOLOGIC:  []  bleeding problems, []  problems with blood clotting too easily X protein S deficiency   MUSCULOSKEL:  []  joint pain, []  joint swelling  GASTROINTEST:  []  vomiting blood, []  blood in stool  GENITOURINARY:  []  burning with urination, []  blood in urine  PSYCHIATRIC:  []  history of major depression  INTEGUMENTARY:  []  rashes, [x]  ulcers  CONSTITUTIONAL:  []  fever, []  chills   Physical Examination Filed Vitals:   01/10/14 1438  BP: 126/71  Pulse: 73  Height: 5\' 3"  (1.6 m)  Weight: 185 lb (83.915 kg)  SpO2: 99%   Body mass index is 32.78 kg/(m^2).  General: A&O x 3, WDWN  Head: [place] Ear/Nose/Throat: Hearing grossly intact, nares w/o erythema or drainage, oropharynx w/o Erythema/Exudate, Hearing loss, Nasal drainage Eyes: PERRLA Neck: Supple Pulmonary: Sym exp, good air movt, CTAB, Cardiac: RRRVascular: Vessel Right Left  Radial Palpable Palpable  Brachial Palpable Palpable  Carotid Palpable, without bruit Palpable, without bruit  Aorta Not palpable N/A  Femoral Palpable Palpable  Popliteal Not palpable Not palpable  PT notPalpable Palpable  DP Palpable notPalpable   Gastrointestinal: soft, NTND Musculoskeletal: M/S 5/5 throughout  Extremities without ischemic changes, Chronic venous status ulcer healed scars around the ankle and gaiter area left leg.  Multiple varicose veins bilateral LE extremities primarily reticular and spider-type varicosities but also several varicose veins palpable which are 4 mm or more in diameter. Neurologic: CN 2-12 intact Psychiatric: Judgment intact, Mood & affect appropriate for pt's clinical situation Dermatologic: See M/S exam for extremity exam, no rashes otherwise noted  Lymph : No Cervical or Inguinal lymphadenopathy  Non-Invasive Vascular Imaging  BLE Venous Duplex (Date: 01/10/2014):  I reviewed and interpreted her study. She has chronic DVT in  the left superficial femoral and popliteal vein with recannulization. She has deep venous reflux in the left leg. She has reflux in the right greater and lesser saphenous vein with vein diameter 5-8 mm.  The left greater saphenous vein is competent. The left lesser saphenous vein also had some element of, incompetence but is only 3 mm to 5 mm in diameter   Medical Decision Making  Patient has postphlebitic syndrome in the left lower extremity. She has a competent left greater saphenous vein. I do not believe ablation of her left greater saphenous would be of significant benefit in the left leg in may actually make things worse since her deep system although patent does have evidence of chronic recannulization.  I believe compression therapy is going to be the mainstay of therapy in the left leg. As far as the right leg is concerned she does have reflux and incompetence of the right greater and lesser saphenous vein. She has a patent deep system on the right side.  She was given measurements and a compression hose prescription today.  She will f/u in 3 months with Dr. Donnetta Hutching for right Venous ablation consideration.  The patient was seen in conjunction with Dr. Oneida Alar.    Ulyses Amor  [facility] Office: [phone]   01/10/2014, 3:11 PM  History exam details as above. Patient may be a candidate for right greater saphenous and lesser saphenous vein ablation. We will place her and compression stockings today. She will followup in 3 months time to consider laser ablation. Left leg has postphlebitic syndrome. I am doubtful that any intervention on that side is going to be helpful other than compression therapy.  Ruta Hinds, MD [facility] Office: [phone] Pager: [phone]

## 2014-01-24 DIAGNOSIS — M542 Cervicalgia: Secondary | ICD-10-CM | POA: Diagnosis not present

## 2014-01-24 DIAGNOSIS — M5030 Other cervical disc degeneration, unspecified cervical region: Secondary | ICD-10-CM | POA: Diagnosis not present

## 2014-01-24 DIAGNOSIS — M79609 Pain in unspecified limb: Secondary | ICD-10-CM | POA: Diagnosis not present

## 2014-02-13 ENCOUNTER — Telehealth: Payer: Self-pay | Admitting: Pharmacist

## 2014-03-19 ENCOUNTER — Telehealth: Payer: Self-pay | Admitting: Pharmacist

## 2014-03-20 ENCOUNTER — Other Ambulatory Visit: Payer: Self-pay | Admitting: Internal Medicine

## 2014-03-20 ENCOUNTER — Other Ambulatory Visit: Payer: Self-pay | Admitting: *Deleted

## 2014-03-20 MED ORDER — FLUTICASONE-SALMETEROL 230-21 MCG/ACT IN AERO: 1.0000 | INHALATION_SPRAY | Freq: Every day | RESPIRATORY_TRACT | Status: DC

## 2014-03-20 MED ORDER — TIOTROPIUM BROMIDE MONOHYDRATE 18 MCG IN CAPS: 18.0000 ug | ORAL_CAPSULE | Freq: Every day | RESPIRATORY_TRACT | Status: DC

## 2014-03-20 NOTE — Telephone Encounter (Signed)
Left smg on vm stating pharmacist stated her xanax refill was denied wanting to know why. Called pt back no answer LMOM md did'nt deny refill was faxed to [facility]. Called [facility] spoke with Doug/pharmacist he stated they never received. Called authorization over phone.  Pt also called [facility] left another msg stating she needing refill on her inhaler advair & spiriva...lmb

## 2014-03-21 ENCOUNTER — Telehealth: Payer: Self-pay | Admitting: Hematology and Oncology

## 2014-03-21 DIAGNOSIS — M5030 Other cervical disc degeneration, unspecified cervical region: Secondary | ICD-10-CM | POA: Diagnosis not present

## 2014-03-21 DIAGNOSIS — M79609 Pain in unspecified limb: Secondary | ICD-10-CM | POA: Diagnosis not present

## 2014-03-21 DIAGNOSIS — M542 Cervicalgia: Secondary | ICD-10-CM | POA: Diagnosis not present

## 2014-03-21 NOTE — Telephone Encounter (Signed)
, °

## 2014-03-25 ENCOUNTER — Ambulatory Visit: Payer: Medicare Other | Admitting: Internal Medicine

## 2014-04-08 ENCOUNTER — Ambulatory Visit: Payer: Medicaid Other | Admitting: Oncology

## 2014-04-08 ENCOUNTER — Encounter: Payer: Self-pay | Admitting: Vascular Surgery

## 2014-04-08 ENCOUNTER — Other Ambulatory Visit: Payer: Self-pay | Admitting: Hematology and Oncology

## 2014-04-08 ENCOUNTER — Other Ambulatory Visit: Payer: Medicaid Other

## 2014-04-09 ENCOUNTER — Ambulatory Visit: Payer: Medicare Other | Admitting: Vascular Surgery

## 2014-04-09 ENCOUNTER — Other Ambulatory Visit: Payer: Medicaid Other

## 2014-04-09 ENCOUNTER — Ambulatory Visit: Payer: Medicaid Other | Admitting: Hematology and Oncology

## 2014-04-17 DIAGNOSIS — M5030 Other cervical disc degeneration, unspecified cervical region: Secondary | ICD-10-CM | POA: Diagnosis not present

## 2014-04-17 DIAGNOSIS — M79609 Pain in unspecified limb: Secondary | ICD-10-CM | POA: Diagnosis not present

## 2014-04-17 DIAGNOSIS — M542 Cervicalgia: Secondary | ICD-10-CM | POA: Diagnosis not present

## 2014-04-22 ENCOUNTER — Encounter: Payer: Self-pay | Admitting: Vascular Surgery

## 2014-04-22 NOTE — Telephone Encounter (Signed)
Phone call - encounter closed. 

## 2014-04-23 ENCOUNTER — Ambulatory Visit: Payer: Medicare Other | Admitting: Vascular Surgery

## 2014-04-26 ENCOUNTER — Telehealth: Payer: Self-pay | Admitting: Internal Medicine

## 2014-04-26 NOTE — Telephone Encounter (Signed)
Called pt no answer LMOM with md response,,,/lmb

## 2014-04-26 NOTE — Telephone Encounter (Signed)
She needs to call the wound care center and ask them to send in refills, I have no record of the meds requested

## 2014-04-26 NOTE — Telephone Encounter (Signed)
Patient Information:  Caller Name: Shakira  Phone: [phone]  Patient: Brooke Garcia, Brooke Garcia  Gender: Female  DOB: 11-07-1955  Age: 58 Years  PCP: Scarlette Calico (Adults only)  Office Follow Up:  Does the office need to follow up with this patient?: Yes  Instructions For The Office: Patient asking for Dressing material for Venous Statis Ulcer until seen by Wound clinic. Please contact patient regarding request.  RN Note:  Patient asking for Dressing material for Venous Statis Ulcer until seen by Wound clinic. Please contact patient regarding request.  Symptoms  Reason For Call & Symptoms: Patient is requesting a Rx for zinc ointment and silver nitrate ointment for her Venous Ulcers.  She has recurrent Venous Ulcers (5 years).  Location -Lower Left leg near outside ankle. smaller than a dime. Onset last week 04/19/14. Slight drainage- yellow. afebrile. NO redness or streaks. No swelling.  No shortness of breath or chestpain.   she is scheduled to be seen by Wound Center/Hyperbariac center but not for 2 weeks. She was advised by center to contact her PCP  For possible Rx for her  dressing material.  Reviewed Health History In EMR: Yes  Reviewed Medications In EMR: Yes  Reviewed Allergies In EMR: Yes  Reviewed Surgeries / Procedures: Yes  Date of Onset of Symptoms: 04/19/2014  Treatments Tried: clean wound, silver nitrate and zine /dressed with compression dressing.  Treatments Tried Worked: No  Guideline(s) Used:  No Protocol Available - Sick Adult  Disposition Per Guideline:   Discuss with PCP and Callback by Nurse within 1 Hour  Reason For Disposition Reached:   Nursing judgment  Advice Given:  Call Back If:  Calf swelling or constant leg pain occur  Signs of infection occur (e.g., spreading redness, warmth, fever)  You become worse.  RN Overrode Recommendation:  Patient Requests Prescription  Patient asking for Dressing material for Venous Statis Ulcer until seen by Wound clinic.  Please contact patient regarding request.

## 2014-05-06 ENCOUNTER — Encounter: Payer: Self-pay | Admitting: Vascular Surgery

## 2014-05-07 ENCOUNTER — Ambulatory Visit: Payer: Medicare Other | Admitting: Vascular Surgery

## 2014-05-09 NOTE — Telephone Encounter (Signed)
Encounter was telephone call. 

## 2014-05-10 ENCOUNTER — Encounter (HOSPITAL_BASED_OUTPATIENT_CLINIC_OR_DEPARTMENT_OTHER): Payer: Medicare Other | Attending: General Surgery

## 2014-05-10 DIAGNOSIS — L97809 Non-pressure chronic ulcer of other part of unspecified lower leg with unspecified severity: Secondary | ICD-10-CM | POA: Insufficient documentation

## 2014-05-10 DIAGNOSIS — I87339 Chronic venous hypertension (idiopathic) with ulcer and inflammation of unspecified lower extremity: Secondary | ICD-10-CM | POA: Diagnosis not present

## 2014-05-10 DIAGNOSIS — L97909 Non-pressure chronic ulcer of unspecified part of unspecified lower leg with unspecified severity: Principal | ICD-10-CM

## 2014-05-11 NOTE — H&P (Signed)
Brooke Garcia, Brooke Garcia                 ACCOUNT NO.:  [id number]  MEDICAL RECORD NO.:  [id number]  LOCATION:  FOOT                         FACILITY:  [facility]  PHYSICIAN:  Elesa Hacker, M.D.        DATE OF BIRTH:  [birth year]  DATE OF ADMISSION:  05/10/2014 DATE OF DISCHARGE:                             HISTORY & PHYSICAL   CHIEF COMPLAINT:  Wound on left leg.  HISTORY OF PRESENT ILLNESS:  This is a 58 year old female, who has had various problems with venous ulcers for approximately 4 years.  She has a protein S deficiency and has been on Coumadin.  Her father also had the same condition.  This ulcer started approximately 3 weeks ago.  She has been treating it with Santyl by herself.  Cigarettes, she continues to smoke a pack a day.  Alcohol, none.  MEDICATIONS:  Alprazolam, Norvasc, Celebrex, Voltaren, Lomotil, Advair, Neurontin, Levsin, Remeron, Protonix.  ALLERGIES:  NAPROXEN, TYLENOL.  PAST MEDICAL HISTORY:  History of multiple clots and phlebitis, hypertension, neuropathy, kidney stones, shingles in 2011, ulcerative colitis, and gallstones.  PAST SURGICAL HISTORY:  Multiple colonoscopies with polyp removal, sinus surgery, and rotator cuff surgery.  PHYSICAL EXAMINATION:  VITAL SIGNS:  Temperature 98.7, pulse 74, respirations 18, blood pressure 124/74. GENERAL APPEARANCE:  Well developed, well nourished, no distress. CHEST:  Clear. HEART:  Regular rhythm. EXTREMITIES:  On the left lower extremity, there is a 1.5 x 1.0 x 0.2 ulcer in the midst of stasis dermatitis.  Dorsalis pedis pulses palpable.  IMPRESSION:  Chronic venous hypertension with ulcer and inflammation. The patient is well known to Dr. Donnetta Hutching who was handling her venous problems as they occur.  We will start treating her with Santyl, Hydrogel, and an Haematologist.  Elevation is recommended.  We will see her in 7 days.     Elesa Hacker, M.D.    RA/MEDQ  D:  05/10/2014  T:  05/11/2014  Job:  [id number]

## 2014-05-15 ENCOUNTER — Other Ambulatory Visit: Payer: Self-pay | Admitting: Internal Medicine

## 2014-05-15 DIAGNOSIS — M79609 Pain in unspecified limb: Secondary | ICD-10-CM | POA: Diagnosis not present

## 2014-05-15 DIAGNOSIS — M542 Cervicalgia: Secondary | ICD-10-CM | POA: Diagnosis not present

## 2014-05-15 DIAGNOSIS — M5030 Other cervical disc degeneration, unspecified cervical region: Secondary | ICD-10-CM | POA: Diagnosis not present

## 2014-05-17 ENCOUNTER — Encounter (HOSPITAL_BASED_OUTPATIENT_CLINIC_OR_DEPARTMENT_OTHER): Payer: Medicare Other | Attending: General Surgery

## 2014-05-17 ENCOUNTER — Encounter: Payer: Self-pay | Admitting: Vascular Surgery

## 2014-05-17 DIAGNOSIS — I872 Venous insufficiency (chronic) (peripheral): Secondary | ICD-10-CM | POA: Insufficient documentation

## 2014-05-17 DIAGNOSIS — L97909 Non-pressure chronic ulcer of unspecified part of unspecified lower leg with unspecified severity: Secondary | ICD-10-CM | POA: Insufficient documentation

## 2014-05-21 ENCOUNTER — Ambulatory Visit: Payer: Medicare Other | Admitting: Vascular Surgery

## 2014-05-23 ENCOUNTER — Ambulatory Visit (INDEPENDENT_AMBULATORY_CARE_PROVIDER_SITE_OTHER): Payer: Medicare Other | Admitting: Internal Medicine

## 2014-05-23 ENCOUNTER — Encounter: Payer: Self-pay | Admitting: Internal Medicine

## 2014-05-23 ENCOUNTER — Ambulatory Visit (INDEPENDENT_AMBULATORY_CARE_PROVIDER_SITE_OTHER)
Admission: RE | Admit: 2014-05-23 | Discharge: 2014-05-23 | Disposition: A | Discharge status: Home / Self Care | Payer: Medicare Other | Source: Ambulatory Visit | Attending: Internal Medicine | Admitting: Internal Medicine

## 2014-05-23 VITALS — BP 118/82 | HR 98 | Temp 98.1°F | Resp 16 | Ht 63.0 in | Wt 189.0 lb

## 2014-05-23 DIAGNOSIS — F17200 Nicotine dependence, unspecified, uncomplicated: Secondary | ICD-10-CM

## 2014-05-23 DIAGNOSIS — R05 Cough: Secondary | ICD-10-CM

## 2014-05-23 DIAGNOSIS — J42 Unspecified chronic bronchitis: Secondary | ICD-10-CM | POA: Diagnosis not present

## 2014-05-23 DIAGNOSIS — F418 Other specified anxiety disorders: Secondary | ICD-10-CM

## 2014-05-23 DIAGNOSIS — R059 Cough, unspecified: Secondary | ICD-10-CM | POA: Diagnosis not present

## 2014-05-23 DIAGNOSIS — F341 Dysthymic disorder: Secondary | ICD-10-CM | POA: Diagnosis not present

## 2014-05-23 MED ORDER — ALPRAZOLAM 1 MG PO TABS: 1.0000 mg | ORAL_TABLET | Freq: Three times a day (TID) | ORAL | Status: DC | PRN

## 2014-05-23 MED ORDER — VARENICLINE TARTRATE 1 MG PO TABS: 1.0000 mg | ORAL_TABLET | Freq: Two times a day (BID) | ORAL | Status: DC

## 2014-05-23 MED ORDER — VARENICLINE TARTRATE 0.5 MG X 11 & 1 MG X 42 PO MISC: ORAL | Status: DC

## 2014-05-23 NOTE — Assessment & Plan Note (Signed)
She will quit smoking She will cont to use the current inhalers

## 2014-05-23 NOTE — Assessment & Plan Note (Signed)
Will cont xanax and remeron as needed

## 2014-05-23 NOTE — Patient Instructions (Signed)
Cough, Adult  A cough is a reflex that helps clear your throat and airways. It can help heal the body or may be a reaction to an irritated airway. A cough may only last 2 or 3 weeks (acute) or may last more than 8 weeks (chronic).  CAUSES Acute cough:  Viral or bacterial infections. Chronic cough:  Infections.  Allergies.  Asthma.  Post-nasal drip.  Smoking.  Heartburn or acid reflux.  Some medicines.  Chronic lung problems (COPD).  Cancer. SYMPTOMS   Cough.  Fever.  Chest pain.  Increased breathing rate.  High-pitched whistling sound when breathing (wheezing).  Colored mucus that you cough up (sputum). TREATMENT   A bacterial cough may be treated with antibiotic medicine.  A viral cough must run its course and will not respond to antibiotics.  Your caregiver may recommend other treatments if you have a chronic cough. HOME CARE INSTRUCTIONS   Only take over-the-counter or prescription medicines for pain, discomfort, or fever as directed by your caregiver. Use cough suppressants only as directed by your caregiver.  Use a cold steam vaporizer or humidifier in your bedroom or home to help loosen secretions.  Sleep in a semi-upright position if your cough is worse at night.  Rest as needed.  Stop smoking if you smoke. SEEK IMMEDIATE MEDICAL CARE IF:   You have pus in your sputum.  Your cough starts to worsen.  You cannot control your cough with suppressants and are losing sleep.  You begin coughing up blood.  You have difficulty breathing.  You develop pain which is getting worse or is uncontrolled with medicine.  You have a fever. MAKE SURE YOU:   Understand these instructions.  Will watch your condition.  Will get help right away if you are not doing well or get worse. Document Released: 02/26/2011 Document Revised: 11/22/2011 Document Reviewed: 02/26/2011 ExitCare Patient Information 2015 [employer]. This information is not intended  to replace advice given to you by your health care provider. Make sure you discuss any questions you have with your health care provider.  

## 2014-05-23 NOTE — Assessment & Plan Note (Signed)
She is ready to quit smoking Will start chantix

## 2014-05-23 NOTE — Progress Notes (Signed)
Subjective:    Patient ID: Brooke Garcia, female    DOB: [birth year], 58 y.o.   MRN: [id number]  Cough This is a recurrent problem. The current episode started more than 1 month ago. The problem has been unchanged. The problem occurs every few hours. The cough is productive of sputum. Associated symptoms include shortness of breath and wheezing. Pertinent negatives include no chest pain, chills, ear congestion, ear pain, fever, headaches, heartburn, hemoptysis, myalgias, nasal congestion, postnasal drip, rash, rhinorrhea, sore throat, sweats or weight loss. Nothing aggravates the symptoms. Risk factors for lung disease include smoking/tobacco exposure. She has tried OTC cough suppressant, steroid inhaler, a beta-agonist inhaler and ipratropium inhaler for the symptoms. The treatment provided moderate relief. Her past medical history is significant for bronchitis and COPD. There is no history of asthma, bronchiectasis, emphysema, environmental allergies or pneumonia.      Review of Systems  Constitutional: Negative.  Negative for fever, chills, weight loss, diaphoresis, activity change, appetite change, fatigue and unexpected weight change.  HENT: Negative.  Negative for ear pain, postnasal drip, rhinorrhea, sinus pressure, sore throat and trouble swallowing.   Eyes: Negative.   Respiratory: Positive for cough, shortness of breath and wheezing. Negative for hemoptysis.   Cardiovascular: Negative.  Negative for chest pain, palpitations and leg swelling.  Gastrointestinal: Negative.  Negative for heartburn and abdominal pain.  Endocrine: Negative.   Genitourinary: Negative.   Musculoskeletal: Positive for arthralgias. Negative for back pain, gait problem, joint swelling, myalgias and neck pain.  Skin: Negative.  Negative for rash.  Allergic/Immunologic: Negative.  Negative for environmental allergies.  Neurological: Negative.  Negative for dizziness and headaches.  Hematological: Negative.   Negative for adenopathy. Does not bruise/bleed easily.  Psychiatric/Behavioral: Negative for suicidal ideas, hallucinations, behavioral problems, confusion, sleep disturbance, self-injury, dysphoric mood, decreased concentration and agitation. The patient is nervous/anxious. The patient is not hyperactive.        Objective:   Physical Exam  Vitals reviewed. Constitutional: She is oriented to person, place, and time. She appears well-developed and well-nourished. No distress.  HENT:  Head: Normocephalic and atraumatic.  Mouth/Throat: Oropharynx is clear and moist. No oropharyngeal exudate.  Eyes: Conjunctivae are normal. Right eye exhibits no discharge. Left eye exhibits no discharge. No scleral icterus.  Neck: Normal range of motion. Neck supple. No JVD present. No tracheal deviation present. No thyromegaly present.  Cardiovascular: Normal rate, regular rhythm, normal heart sounds and intact distal pulses.  Exam reveals no gallop and no friction rub.   No murmur heard. Pulmonary/Chest: Effort normal and breath sounds normal. No stridor. No respiratory distress. She has no wheezes. She has no rales. She exhibits no tenderness.  Abdominal: Soft. Bowel sounds are normal. She exhibits no distension and no mass. There is no tenderness. There is no rebound and no guarding.  Musculoskeletal: Normal range of motion. She exhibits no edema and no tenderness.  Lymphadenopathy:    She has no cervical adenopathy.  Neurological: She is oriented to person, place, and time.  Skin: Skin is warm and dry. No rash noted. She is not diaphoretic. No erythema. No pallor.  Psychiatric: She has a normal mood and affect. Her speech is normal and behavior is normal. Judgment and thought content normal. Her mood appears not anxious. Her affect is not angry. Cognition and memory are normal. Cognition and memory are not impaired. She does not exhibit a depressed mood. She expresses no homicidal and no suicidal ideation.  She expresses no suicidal plans and no  homicidal plans.     Lab Results  Component Value Date   WBC 8.3 12/24/2013   HGB 14.7 12/24/2013   HCT 42.9 12/24/2013   PLT 229.0 12/24/2013   GLUCOSE 92 12/24/2013   CHOL 172 12/24/2013   TRIG 88.0 12/24/2013   HDL 50.20 12/24/2013   LDLCALC 104* 12/24/2013   ALT 25 12/24/2013   AST 32 12/24/2013   NA 138 12/24/2013   K 3.6 12/24/2013   CL 104 12/24/2013   CREATININE 0.7 12/24/2013   BUN 10 12/24/2013   CO2 25 12/24/2013   TSH 1.44 12/24/2013   INR 1.6 12/24/2013       Assessment & Plan:

## 2014-05-24 DIAGNOSIS — I872 Venous insufficiency (chronic) (peripheral): Secondary | ICD-10-CM | POA: Diagnosis not present

## 2014-05-24 DIAGNOSIS — L97909 Non-pressure chronic ulcer of unspecified part of unspecified lower leg with unspecified severity: Secondary | ICD-10-CM | POA: Diagnosis not present

## 2014-05-31 ENCOUNTER — Other Ambulatory Visit: Payer: Self-pay | Admitting: *Deleted

## 2014-05-31 DIAGNOSIS — L97909 Non-pressure chronic ulcer of unspecified part of unspecified lower leg with unspecified severity: Secondary | ICD-10-CM | POA: Diagnosis not present

## 2014-05-31 DIAGNOSIS — I872 Venous insufficiency (chronic) (peripheral): Secondary | ICD-10-CM | POA: Diagnosis not present

## 2014-05-31 MED ORDER — DIPHENOXYLATE-ATROPINE 2.5-0.025 MG PO TABS: 1.0000 | ORAL_TABLET | Freq: Four times a day (QID) | ORAL | Status: DC | PRN

## 2014-06-10 ENCOUNTER — Encounter: Payer: Self-pay | Admitting: Vascular Surgery

## 2014-06-11 ENCOUNTER — Encounter: Payer: Self-pay | Admitting: Vascular Surgery

## 2014-06-11 ENCOUNTER — Other Ambulatory Visit: Payer: Self-pay | Admitting: *Deleted

## 2014-06-11 ENCOUNTER — Ambulatory Visit (INDEPENDENT_AMBULATORY_CARE_PROVIDER_SITE_OTHER): Payer: Medicare Other | Admitting: Vascular Surgery

## 2014-06-11 ENCOUNTER — Ambulatory Visit (HOSPITAL_COMMUNITY)
Admission: RE | Admit: 2014-06-11 | Discharge: 2014-06-11 | Disposition: A | Discharge status: Home / Self Care | Payer: Medicare Other | Source: Ambulatory Visit | Attending: Vascular Surgery | Admitting: Vascular Surgery

## 2014-06-11 VITALS — BP 125/82 | HR 67 | Resp 18 | Ht 63.5 in | Wt 193.1 lb

## 2014-06-11 DIAGNOSIS — I83029 Varicose veins of left lower extremity with ulcer of unspecified site: Secondary | ICD-10-CM

## 2014-06-11 DIAGNOSIS — L97929 Non-pressure chronic ulcer of unspecified part of left lower leg with unspecified severity: Principal | ICD-10-CM

## 2014-06-11 DIAGNOSIS — I83893 Varicose veins of bilateral lower extremities with other complications: Secondary | ICD-10-CM | POA: Diagnosis not present

## 2014-06-11 DIAGNOSIS — L97909 Non-pressure chronic ulcer of unspecified part of unspecified lower leg with unspecified severity: Principal | ICD-10-CM | POA: Insufficient documentation

## 2014-06-11 DIAGNOSIS — L97921 Non-pressure chronic ulcer of unspecified part of left lower leg limited to breakdown of skin: Secondary | ICD-10-CM

## 2014-06-11 DIAGNOSIS — I83009 Varicose veins of unspecified lower extremity with ulcer of unspecified site: Secondary | ICD-10-CM | POA: Diagnosis not present

## 2014-06-11 NOTE — Progress Notes (Signed)
Problems with Activities of Daily Living Secondary to Leg Pain  1. Ms. Brooke Garcia states she has problems with all activities that require prolonged standing (cooking, cleaning, shopping) are difficult due to leg pain.    2. Ms. Brooke Garcia states she is unable to exercise due to leg pain.     Failure of  Conservative Therapy:  1. Worn 20-30 mm Hg thigh high compression hose >3 months with no relief of symptoms.  2. Frequently elevates legs-no relief of symptoms  3. Taken Ibuprofen 600 Mg TID with no relief of symptoms.  Very complex patient with bilateral lower extremity venous hypertension, left greater than right. He continues to have a large venous ulcer of the lateral aspect of her left leg and marked changes of venous hypertension despite local wound care elevation and compression.  I did reimage her left leg with SonoSite ultrasound shows markedly dilated left great saphenous vein. For unknown reasons this was not seen on her duplex 3 months ago. She did have repeated formal venous duplex of the left dystocia worked in March great saphenous vein with reflux throughout its course. She does have known deep venous reflux as well does have a history of left leg DVT. Her deep system shows some chronic thrombus but no occlusion.  I do feel she has clearly failed conservative treatment. I have recommended left leg laser ablation followed by a right leg laser ablation assuming improvement on the left. She understands the procedure in addition she will have to be off Coumadin with Lovenox bridge. I did explain the slight risk of DVT with the procedure her she wished to proceed as soon as possible

## 2014-06-12 DIAGNOSIS — I872 Venous insufficiency (chronic) (peripheral): Secondary | ICD-10-CM | POA: Diagnosis not present

## 2014-06-13 DIAGNOSIS — M79609 Pain in unspecified limb: Secondary | ICD-10-CM | POA: Diagnosis not present

## 2014-06-13 DIAGNOSIS — M5090 Cervical disc disorder, unspecified, unspecified cervical region: Secondary | ICD-10-CM | POA: Diagnosis not present

## 2014-06-13 DIAGNOSIS — M542 Cervicalgia: Secondary | ICD-10-CM | POA: Diagnosis not present

## 2014-06-19 ENCOUNTER — Encounter (HOSPITAL_BASED_OUTPATIENT_CLINIC_OR_DEPARTMENT_OTHER): Payer: Medicare Other | Attending: General Surgery

## 2014-06-19 DIAGNOSIS — I878 Other specified disorders of veins: Secondary | ICD-10-CM | POA: Insufficient documentation

## 2014-06-19 DIAGNOSIS — L97929 Non-pressure chronic ulcer of unspecified part of left lower leg with unspecified severity: Secondary | ICD-10-CM | POA: Insufficient documentation

## 2014-06-20 ENCOUNTER — Telehealth: Payer: Self-pay | Admitting: *Deleted

## 2014-06-20 NOTE — Telephone Encounter (Signed)
Pt called requesting an appt w/ a new med onc since Dr. Humphrey Rolls was gone.  Confirmed 06/24/14 appt w/ pt.

## 2014-06-24 ENCOUNTER — Ambulatory Visit: Payer: Medicare Other | Admitting: Hematology and Oncology

## 2014-06-24 ENCOUNTER — Telehealth: Payer: Self-pay | Admitting: Adult Health

## 2014-06-24 NOTE — Telephone Encounter (Signed)
R/s apt w/ per pt req.

## 2014-07-02 ENCOUNTER — Telehealth: Payer: Self-pay | Admitting: *Deleted

## 2014-07-02 ENCOUNTER — Ambulatory Visit: Payer: Medicare Other | Admitting: Adult Health

## 2014-07-02 NOTE — Telephone Encounter (Signed)
Returned pt's call concerning her appt on today. Pt told me she would not be able to come to appt today b/c of transportation issues. Pt is scheduled for surgery with [facility]net[facility] on [facility]. I did discuss with the pt my concerns about so many missed appts in the past and Dr. Lindi Adie must see her before this surgery so he can evaluate her. Pt understood and agreed to come tomorrow at 1:30 to be seen by Dr. Lindi Adie.

## 2014-07-03 ENCOUNTER — Encounter: Payer: Self-pay | Admitting: Vascular Surgery

## 2014-07-03 ENCOUNTER — Telehealth: Payer: Self-pay | Admitting: Hematology and Oncology

## 2014-07-04 ENCOUNTER — Ambulatory Visit (INDEPENDENT_AMBULATORY_CARE_PROVIDER_SITE_OTHER): Payer: Medicare Other | Admitting: Vascular Surgery

## 2014-07-04 ENCOUNTER — Encounter: Payer: Self-pay | Admitting: Vascular Surgery

## 2014-07-04 VITALS — BP 143/86 | HR 83 | Resp 16 | Ht 63.0 in | Wt 186.0 lb

## 2014-07-04 DIAGNOSIS — I83892 Varicose veins of left lower extremities with other complications: Secondary | ICD-10-CM

## 2014-07-04 DIAGNOSIS — I83899 Varicose veins of unspecified lower extremities with other complications: Secondary | ICD-10-CM | POA: Insufficient documentation

## 2014-07-04 HISTORY — PX: ENDOVENOUS ABLATION SAPHENOUS VEIN W/ LASER: SUR449

## 2014-07-04 NOTE — Progress Notes (Signed)
   Laser Ablation Procedure      Date: 07/04/2014    Brooke Garcia DOB:06/25/1956  Consent signed: Yes  Surgeon:T.F. Windel Keziah  Procedure: Laser Ablation: left Greater Saphenous Vein  BP 143/86  Pulse 83  Resp 16  Ht 5\' 3"  (1.6 m)  Wt 186 lb (84.369 kg)  BMI 32.96 kg/m2  Start time: 1030   End time: 1130  Tumescent Anesthesia: 450 cc 0.9% NaCl with 50 cc Lidocaine HCL with 1% Epi and 15 cc 8.4% NaHCO3  Local Anesthesia: 3 cc Lidocaine HCL and NaHCO3 (ratio 2:1)  Pulsed mode:15 watts, 556ms delay, 1.0 duration and Total energy: 2551, Total pulses: 1, Total time: 2:49     Patient tolerated procedure well: Yes  Notes: Patient did have discontinuation of Coumadin with a Lovenox bridge for 5 days" of today's. Care was taken to keep the J-wire below the saphenofemoral junction and also the sheath was kept below the saphenofemoral junction.  Description of Procedure:  After marking the course of the secondary varicosities, the patient was placed on the operating table in the supine position, and the left leg was prepped and draped in sterile fashion.   Local anesthetic was administered and under ultrasound guidance the saphenous vein was accessed with a micro needle and guide wire; then the mirco puncture sheath was place.  A guide wire was inserted saphenofemoral junction , followed by a 5 french sheath.  The position of the sheath and then the laser fiber below the junction was confirmed using the ultrasound.  Tumescent anesthesia was administered along the course of the saphenous vein using ultrasound guidance. The patient was placed in Trendelenburg position and protective laser glasses were placed on patient and staff, and the laser was fired at at 15 watt continuous mode for a total of 2551 joules.    Steri strips were applied to the stab wounds and ABD pads and thigh high compression stockings were applied.  Ace wrap bandages were applied over the phlebectomy sites and at the top  of the saphenofemoral junction. Blood loss was less than 15 cc.  The patient ambulated out of the operating room having tolerated the procedure well.

## 2014-07-09 ENCOUNTER — Encounter: Payer: Self-pay | Admitting: Adult Health

## 2014-07-09 ENCOUNTER — Telehealth: Payer: Self-pay | Admitting: *Deleted

## 2014-07-09 ENCOUNTER — Telehealth: Payer: Self-pay | Admitting: Adult Health

## 2014-07-09 ENCOUNTER — Encounter: Payer: Medicare Other | Admitting: Adult Health

## 2014-07-09 VITALS — BP 150/77 | HR 86 | Temp 98.4°F | Resp 18 | Ht 63.0 in | Wt 195.0 lb

## 2014-07-09 DIAGNOSIS — I82403 Acute embolism and thrombosis of unspecified deep veins of lower extremity, bilateral: Secondary | ICD-10-CM

## 2014-07-09 MED ORDER — WARFARIN SODIUM 5 MG PO TABS: 5.0000 mg | ORAL_TABLET | Freq: Every day | ORAL | Status: DC

## 2014-07-09 MED ORDER — ENOXAPARIN SODIUM 150 MG/ML ~~LOC~~ SOLN: 1.5000 mg/kg | SUBCUTANEOUS | Status: DC

## 2014-07-09 NOTE — Progress Notes (Signed)
[place]  [phone] Fax:(336) [phone]  OFFICE PROGRESS NOTE  ID: Brooke Garcia   DOB: 21-Jul-1956  MR#: [id number]  WLS#:[id number]   PCP: Lindwood Qua, M.D. GI:  Zenovia Jarred, M.D.   DIAGNOSIS: Protein C and S deficiency, multiple thrombosis   PRIOR THERAPY: Was previously on Coumadin   CURRENT THERAPY:  Lovenox   INTERVAL HISTORY: Brooke Garcia 58 y.o. female returns for follow up of protein C and protein S deficiency and history of multiple thrombosis.  She has chronic leg ulcers that she has been dealing with for over three years.  She has also previously been on Coumadin.  Her last office visit was in October, 2014.  She was on Coumadin previously and was last seen in our Coumadin clinic about 6 months ago.  She has missed several appointments.  She has continued to take Coumadin 7.5mg  Monday, Wednesday and Friday, and 5mg  the other four days.  She has not had her Coumadin level drawn in 6 months.  She was supposed to come in last week for an appointment on what to do with her blood thinners and stopping/coumadin/starting Lovenox because she had a laser ablation procedure on her left greater saphenous vein on 07/04/14.  She wanted to cancel, however wanted to receive over the phone instructions on how to take the Lovenox.  She last took Coumadin on 06/28/14, started Lovenox 150mg  on 06/29/14.  She has increased pain in her left leg.  She cannot get her full compression stocking on and has half a compression stocking on.  She has called and informed Dr. Luther Parody office of this and per their note they recommended elevation, ice compresses, and compression as well as Ibuprofen.  She will f/u with him on 07/11/14.  She denies easy bruising or bleeding.     MEDICAL HISTORY: Past Medical History  Diagnosis Date  . Tobacco use disorder   . Unspecified urinary incontinence   . Complete rupture of rotator cuff   . Acute venous embolism and thrombosis of  unspecified deep vessels of lower extremity   . Calculus of kidney   . Degeneration of intervertebral disc, site unspecified   . Painful respiration   . Other and unspecified noninfectious gastroenteritis and colitis(558.9)   . HTN (hypertension)   . Family history of ischemic heart disease   . Anxiety state, unspecified   . HLD (hyperlipidemia)   . Primary hypercoagulable state   . Arthritis   . COPD (chronic obstructive pulmonary disease)   . Depression   . History of gallstones   . Protein S deficiency   . Colitis   . Renal failure   . Blood dyscrasia     protein def.  . Venous stasis ulcers     left leg  . DVT (deep venous thrombosis)     ALLERGIES:   Allergies  Allergen Reactions  . Butalbital-Apap-Caffeine Hives  . Naproxen Hives  . Tylenol [Acetaminophen] Other (See Comments)    Upset stomach     MEDICATIONS:  Current Outpatient Prescriptions  Medication Sig Dispense Refill  . ALPRAZolam (XANAX) 1 MG tablet Take 1 tablet (1 mg total) by mouth 3 (three) times daily as needed for anxiety.  90 tablet  3  . amLODipine (NORVASC) 10 MG tablet take 1 tablet by mouth once daily  90 tablet  3  . celecoxib (CELEBREX) 200 MG capsule Take 1 capsule (200 mg total) by mouth 2 (two) times daily.  180 capsule  1  .  diphenoxylate-atropine (LOMOTIL) 2.5-0.025 MG per tablet Take 1 tablet by mouth 4 (four) times daily as needed for diarrhea or loose stools.  120 tablet  0  . enoxaparin (LOVENOX) 150 MG/ML injection Inject into the skin.      . fluticasone-salmeterol (ADVAIR HFA) 230-21 MCG/ACT inhaler Inhale 1 puff into the lungs at bedtime.  1 Inhaler  12  . mirtazapine (REMERON) 30 MG tablet Take 1 tablet (30 mg total) by mouth at bedtime.  90 tablet  3  . oxyCODONE (OXY IR/ROXICODONE) 5 MG immediate release tablet Take 10 mg by mouth every 6 (six) hours as needed for severe pain.      . pantoprazole (PROTONIX) 40 MG tablet Take 1 tablet (40 mg total) by mouth daily.  90 tablet  3    . tiotropium (SPIRIVA HANDIHALER) 18 MCG inhalation capsule Place 1 capsule (18 mcg total) into inhaler and inhale daily.  30 capsule  12  . warfarin (COUMADIN) 5 MG tablet Take 1-1.5 tablets (5-7.5 mg total) by mouth daily. Titrate as directed based on INR and MD recommendation.  60 tablet  3  . gabapentin (NEURONTIN) 600 MG tablet Take 1 tablet (600 mg total) by mouth 3 (three) times daily.  270 tablet  5  . hyoscyamine (LEVSIN SL) 0.125 MG SL tablet Place 1 tablet (0.125 mg total) under the tongue every 4 (four) hours as needed for cramping.  30 tablet  3  . varenicline (CHANTIX CONTINUING MONTH PAK) 1 MG tablet Take 1 tablet (1 mg total) by mouth 2 (two) times daily.  60 tablet  3  . varenicline (CHANTIX STARTING MONTH PAK) 0.5 MG X 11 & 1 MG X 42 tablet Take one 0.5 mg tablet by mouth once daily for 3 days, then increase to one 0.5 mg tablet twice daily for 4 days, then increase to one 1 mg tablet twice daily.  53 tablet  0   No current facility-administered medications for this visit.    SURGICAL HISTORY:  Past Surgical History  Procedure Laterality Date  . Thrombectomy / embolectomy femoral artery      DVT  . Nasal sinus surgery    . Tubal ligation    . Rotator cuff repair      right  . Tubal ligation reversal    . Colonoscopy  04/27/2012    Procedure: COLONOSCOPY;  Surgeon: Jerene Bears, MD;  Location: [facility];  Service: Gastroenterology;  Laterality: N/A;  . Endovenous ablation saphenous vein w/ laser Left 07-04-2014    EVLA left greater saphenous vein by Curt Jews MD    REVIEW OF SYSTEMS:   A 10 point review of systems was conducted and is otherwise negative except for what is noted above.     PHYSICAL EXAMINATION: Blood pressure 150/77, pulse 86, temperature 98.4 F (36.9 C), temperature source Oral, resp. rate 18, height 5\' 3"  (1.6 m), weight 195 lb (88.451 kg). Body mass index is 34.55 kg/(m^2). GENERAL: Patient is a well appearing female in no acute  distress HEENT:  Sclerae anicteric.  Oropharynx clear and moist. No ulcerations or evidence of oropharyngeal candidiasis. Neck is supple.  NODES:  No cervical, supraclavicular, or axillary lymphadenopathy palpated.  LUNGS:  Clear to auscultation bilaterally.  No wheezes or rhonchi. HEART:  Regular rate and rhythm. No murmur appreciated. ABDOMEN:  Soft, nontender.  Positive, normoactive bowel sounds. No organomegaly palpated. MSK:  No focal spinal tenderness to palpation. Full range of motion bilaterally in the upper extremities. EXTREMITIES:  Left  leg swollen with knee high anti embolism hose, upper leg wrapped in compression type dressing.   SKIN:  Clear with no obvious rashes or skin changes. No nail dyscrasia. NEURO:  Nonfocal. Well oriented.  Appropriate affect. ECOG PERFORMANCE STATUS: 1 - Symptomatic but completely ambulatory   LABORATORY DATA: Lab Results  Component Value Date   WBC 8.3 12/24/2013   HGB 14.7 12/24/2013   HCT 42.9 12/24/2013   MCV 94.1 12/24/2013   PLT 229.0 12/24/2013      Chemistry      Component Value Date/Time   NA 138 12/24/2013 1659   NA 142 04/10/2013 1344   K 3.6 12/24/2013 1659   K 3.9 04/10/2013 1344   CL 104 12/24/2013 1659   CL 104 07/17/2012 1350   CO2 25 12/24/2013 1659   CO2 29 04/10/2013 1344   BUN 10 12/24/2013 1659   BUN 7.4 04/10/2013 1344   CREATININE 0.7 12/24/2013 1659   CREATININE 0.8 04/10/2013 1344      Component Value Date/Time   CALCIUM 8.9 12/24/2013 1659   CALCIUM 9.4 04/10/2013 1344   ALKPHOS 89 12/24/2013 1659   ALKPHOS 109 07/17/2012 1350   AST 32 12/24/2013 1659   AST 49* 07/17/2012 1350   ALT 25 12/24/2013 1659   ALT 27 07/17/2012 1350   BILITOT 0.9 12/24/2013 1659   BILITOT 0.62 07/17/2012 1350      RADIOGRAPHIC STUDIES: No results found.    ASSESSMENT and PLAN:   Brooke Garcia is a 58 y.o. female with protein C and protein S deficiency on chronic anticoagulation therapy secondary to a history of multiple episodes of thrombosis.     She has been very non compliant with her coumadin this year and following up for her INR checks and dosage monitoring.  I reviewed this with the patient and that she is jeopardizing her health due to her not following up with the Coumadin clinic.  She verbalized understanding of this.    She will f/u with Dr. Donnetta Hutching on 07/11/14 in regards to her left leg pain.    She will continue Enoxaparin at 1.5mg /kg per day.  She will do this until 11/4 as her right leg vein ablation is on 07/18/14.  She will restart Coumadin on 11/6 and f/u with Coumadin clinic on 07/22/14.  I did inform her that if she continued to be non compliant with Coumadin monitoring that I would not continue to prescribe it.  I gave her one months worth with no refills.  The patient verbalized understanding.    All questions answered.  Ms. Rampersaud was encouraged to contact us in the interim with any problems, questions, or concerns.  The above was reviewed with Dr. Lindi Adie and he saw the patient as well.    I spent 25 minutes counseling the patient face to face.  The total time spent in the appointment was 30 minutes.   Minette Headland, [place] [phone] 07/09/2014    10:09 AM

## 2014-07-09 NOTE — Telephone Encounter (Signed)
, °

## 2014-07-09 NOTE — Telephone Encounter (Signed)
    07/09/2014  Time: 9:46 AM   Patient Name: Brooke Garcia  Patient of: T.F. Early  Procedure:Laser Ablation left greater saphenous vein 07-04-2014  Reached patient at home and checked  Her status  Yes    Comments/Actions Taken: Brooke Garcia states that she is having moderate to severe pain left inner thigh and inner aspect of left knee (areas that were treated). Denies swelling left foot/ankle.  Reviewed all post procedural instructions with her.  Reminded her to elevate left leg, use ice compress, wear compression dressing/compression hose and to take Ibuprofen as directed by her medical doctor and Dr. Donnetta Hutching.  Reminded her of post procedural duplex and VV FU with Dr, Donnetta Hutching on 07-11-2014.       @SIGNATURE @

## 2014-07-09 NOTE — Patient Instructions (Signed)
Continue taking Lovenox.  I have prescribed 135mg  per day.  You will continue this until 07/17/14 and restart Coumadin on 07/19/14 with f/u in the coumadin clinic on 07/22/14.  We will see you back in 6 months.

## 2014-07-10 ENCOUNTER — Encounter: Payer: Self-pay | Admitting: Vascular Surgery

## 2014-07-10 ENCOUNTER — Other Ambulatory Visit: Payer: Self-pay | Admitting: *Deleted

## 2014-07-10 DIAGNOSIS — I83891 Varicose veins of right lower extremities with other complications: Secondary | ICD-10-CM

## 2014-07-11 ENCOUNTER — Ambulatory Visit (HOSPITAL_COMMUNITY)
Admission: RE | Admit: 2014-07-11 | Discharge: 2014-07-11 | Disposition: A | Discharge status: Home / Self Care | Payer: Medicare Other | Source: Ambulatory Visit | Attending: Vascular Surgery | Admitting: Vascular Surgery

## 2014-07-11 ENCOUNTER — Ambulatory Visit (INDEPENDENT_AMBULATORY_CARE_PROVIDER_SITE_OTHER): Payer: Medicare Other | Admitting: Vascular Surgery

## 2014-07-11 ENCOUNTER — Encounter: Payer: Self-pay | Admitting: Vascular Surgery

## 2014-07-11 VITALS — BP 144/82 | HR 64 | Resp 16 | Ht 63.0 in | Wt 195.0 lb

## 2014-07-11 DIAGNOSIS — I83021 Varicose veins of left lower extremity with ulcer of thigh: Secondary | ICD-10-CM

## 2014-07-11 DIAGNOSIS — M5031 Other cervical disc degeneration,  high cervical region: Secondary | ICD-10-CM | POA: Diagnosis not present

## 2014-07-11 DIAGNOSIS — M79609 Pain in unspecified limb: Secondary | ICD-10-CM | POA: Diagnosis not present

## 2014-07-11 DIAGNOSIS — I83892 Varicose veins of left lower extremities with other complications: Secondary | ICD-10-CM | POA: Diagnosis not present

## 2014-07-11 DIAGNOSIS — I83025 Varicose veins of left lower extremity with ulcer other part of foot: Secondary | ICD-10-CM

## 2014-07-11 DIAGNOSIS — L97929 Non-pressure chronic ulcer of unspecified part of left lower leg with unspecified severity: Secondary | ICD-10-CM

## 2014-07-11 DIAGNOSIS — I83029 Varicose veins of left lower extremity with ulcer of unspecified site: Secondary | ICD-10-CM | POA: Diagnosis not present

## 2014-07-11 DIAGNOSIS — I83022 Varicose veins of left lower extremity with ulcer of calf: Secondary | ICD-10-CM

## 2014-07-11 DIAGNOSIS — I83024 Varicose veins of left lower extremity with ulcer of heel and midfoot: Secondary | ICD-10-CM | POA: Diagnosis not present

## 2014-07-11 DIAGNOSIS — I83028 Varicose veins of left lower extremity with ulcer other part of lower leg: Secondary | ICD-10-CM

## 2014-07-11 DIAGNOSIS — I83023 Varicose veins of left lower extremity with ulcer of ankle: Secondary | ICD-10-CM | POA: Diagnosis not present

## 2014-07-11 DIAGNOSIS — M542 Cervicalgia: Secondary | ICD-10-CM | POA: Diagnosis not present

## 2014-07-11 NOTE — Progress Notes (Signed)
Here today for 1 week follow-up of her laser ablation of her left great saphenous vein. She has been compliant with her compression garments. She has certainly had more than the typical amount of inflammation and pain associated with the procedure. She does on physical exam  Typical amount of bruising expected. Somewhat more inflammation particularly in the medial thigh. Very tender over this area. No evidence of skin injury.   On duplex today of this reveals closure of her great saphenous vein from the distal insertion site to the level of the below the saphenofemoral junction. She does have some chronic left distal superficial femoral and popliteal vein which is nonocclusive.  Impression and plan good outcome from her laser ablation. Certainly more discomfort than typical. She will continue her compression for 1 additional week and then see [place] for right leg ablation on 07/17/2014. Did offer to delay this and she is having this discomfort but she wishes to proceed up clinically in light of being on Lovenox and off Coumadin until the next stage. She will call us if there is any changes.

## 2014-07-16 ENCOUNTER — Encounter: Payer: Self-pay | Admitting: Vascular Surgery

## 2014-07-17 ENCOUNTER — Encounter: Payer: Self-pay | Admitting: Vascular Surgery

## 2014-07-17 ENCOUNTER — Ambulatory Visit (INDEPENDENT_AMBULATORY_CARE_PROVIDER_SITE_OTHER): Payer: Medicare Other | Admitting: Vascular Surgery

## 2014-07-17 VITALS — BP 122/76 | HR 79 | Resp 18 | Ht 63.0 in | Wt 195.0 lb

## 2014-07-17 DIAGNOSIS — I83891 Varicose veins of right lower extremities with other complications: Secondary | ICD-10-CM

## 2014-07-17 HISTORY — PX: ENDOVENOUS ABLATION SAPHENOUS VEIN W/ LASER: SUR449

## 2014-07-17 NOTE — Progress Notes (Signed)
   Laser Ablation Procedure      Date: 07/17/2014    Brooke Garcia DOB:1955-11-29  Consent signed: Yes  Surgeon:T.F. Abdulahi Schor  Procedure: Laser Ablation: right Greater Saphenous Vein  BP 122/76 mmHg  Pulse 79  Resp 18  Ht 5\' 3"  (1.6 m)  Wt 195 lb (88.451 kg)  BMI 34.55 kg/m2  Start time: 1040   End time: 1130  Tumescent Anesthesia: 500 cc 0.9% NaCl with 50 cc Lidocaine HCL with 1% Epi and 15 cc 8.4% NaHCO3  Local Anesthesia: 3 cc Lidocaine HCL and NaHCO3 (ratio 2:1)  Pulsed mode:15 watts, 565ms delay, 1.0 duration and Total energy: 2694, Total pulses: 1, Total time: 3;00    Patient tolerated procedure well: Yes  Notes: ablation from mid calf to saphenofemoral junction with no procedural complications  Description of Procedure:  After marking the course of the secondary varicosities, the patient was placed on the operating table in the supine position, and the right leg was prepped and draped in sterile fashion.   Local anesthetic was administered and under ultrasound guidance the saphenous vein was accessed with a micro needle and guide wire; then the mirco puncture sheath was place.  A guide wire was inserted saphenofemoral junction , followed by a 5 french sheath.  The position of the sheath and then the laser fiber below the junction was confirmed using the ultrasound.  Tumescent anesthesia was administered along the course of the saphenous vein using ultrasound guidance. The patient was placed in Trendelenburg position and protective laser glasses were placed on patient and staff, and the laser was fired at at 15 watt continuous mode for a total of 2694 joules.     Steri strips were applied to the stab wounds and ABD pads and thigh high compression stockings were applied.  Ace wrap bandages were applied over the phlebectomy sites and at the top of the saphenofemoral junction. Blood loss was less than 15 cc.  The patient ambulated out of the operating room having tolerated the  procedure well.

## 2014-07-18 ENCOUNTER — Telehealth: Payer: Self-pay | Admitting: *Deleted

## 2014-07-18 NOTE — Telephone Encounter (Signed)
    07/18/2014  Time: 2:48 PM   Patient Name: Brooke Garcia  Patient of: T.F. Early  Procedure:Laser Ablation right greater saphenous vein 07-18-2014  Reached patient at home and checked  Her status  Yes    Comments/Actions Taken: [name] states that she is having moderate/severe pain right inner thigh.  States she is wearing her compression dressing and keeping her right leg elevated.  Denies right leg swelling.  States she is using cold compress to right inner thigh and taking 600 mg Ibuprofen three times daily with meals.  Suggested she take Ibuprofen 800 mg three times daily with meals if pain was severe for 2-3 days.  Reviewed post procedural instructions with her and reminded her of post laser ablation duplex and VV FU with Dr. Donnetta Hutching on 07-23-2014.      @SIGNATURE @

## 2014-07-18 NOTE — Telephone Encounter (Signed)
Procedure: Laser Ablation right greater saphenous vein should have been reported as occuring on 07-17-2014.

## 2014-07-22 ENCOUNTER — Telehealth: Payer: Self-pay | Admitting: Internal Medicine

## 2014-07-22 ENCOUNTER — Other Ambulatory Visit: Payer: Medicare Other

## 2014-07-22 ENCOUNTER — Encounter: Payer: Self-pay | Admitting: Vascular Surgery

## 2014-07-22 ENCOUNTER — Ambulatory Visit: Payer: Medicare Other

## 2014-07-22 ENCOUNTER — Telehealth: Payer: Self-pay | Admitting: Pharmacist

## 2014-07-22 NOTE — Telephone Encounter (Signed)
Pt requesting to do coumadin clinic at [facility] instead of [facility]. Would be this be okay, if so can the paperwork be done for her.  [phone]. Pt will need appt here asap.

## 2014-07-22 NOTE — Telephone Encounter (Signed)
Contacted pt by phone since she did not come this morning for Coumadin clinic appt. She had R leg vein ablation on 07/18/14 and she was to restart Coumadin on 11/6 and f/u with Coumadin clinic today. Pt historically is non-compliant. I left a voicemail asking her to call pharmacy ASAP to schedule INR check. Kennith Center, Pharm.D., CPP 07/22/2014@9 :35 AM

## 2014-07-22 NOTE — Telephone Encounter (Signed)
sure

## 2014-07-23 ENCOUNTER — Ambulatory Visit (HOSPITAL_COMMUNITY)
Admission: RE | Admit: 2014-07-23 | Discharge: 2014-07-23 | Disposition: A | Discharge status: Home / Self Care | Payer: Medicare Other | Source: Ambulatory Visit | Attending: Vascular Surgery | Admitting: Vascular Surgery

## 2014-07-23 ENCOUNTER — Ambulatory Visit: Payer: Medicare Other | Admitting: Vascular Surgery

## 2014-07-23 ENCOUNTER — Telehealth: Payer: Self-pay | Admitting: *Deleted

## 2014-07-23 DIAGNOSIS — I83891 Varicose veins of right lower extremities with other complications: Secondary | ICD-10-CM

## 2014-07-23 NOTE — Telephone Encounter (Signed)
Brooke Garcia called [facility] earlier today and spoke with Brooke Garcia regarding cancelling post laser ablation duplex and VV FU with Brooke Garcia today stating she had no transportation and was unable to drive herself in due to left leg pain.  She is s/p EVLA left greater saphenous vein 07-04-2014 and s/p EVLA right greater saphenous vein 07-17-2014 both by Curt Jews MD.    Brooke Garcia states she is having minimal right leg discomfort but is still having moderate/severe left leg pain.  Discussed Brooke Garcia's symptoms with Brooke Garcia who advised bringing her in for post LA duplex and VV FU  Next Tuesday 07-30-2014.  Brooke Garcia (scheduling) left voice message on Brooke Garcia cell phone regarding new appointment scheduled for 07-30-2014.  Brooke Garcia called at 4:05PM and states she did not receive earlier phone message.  Advised her that I had spoken with Brooke Garcia and she had appointments scheduled for 07-30-2014 at 330p for duplex and 4p VV FU with Brooke Garcia.  Brooke Garcia appears relieved and states she will make that appointment.  States her left leg is still hurting but better than this morning.  Encouraged her to continue using ice compresses, Ibuprofen as directed, keeping legs elevated, and wearing compression hose.  Encouraged her to call [facility] for additional questions or concerns.

## 2014-07-23 NOTE — Telephone Encounter (Signed)
Patient called stating that she will be followed for her Coumadin by Dr. Ronnald Ramp at [facility]. Patient has appt on 11/18. Called and spoke with [place] in pharmacy.

## 2014-07-29 ENCOUNTER — Encounter: Payer: Self-pay | Admitting: Vascular Surgery

## 2014-07-30 ENCOUNTER — Encounter: Payer: Self-pay | Admitting: Vascular Surgery

## 2014-07-30 ENCOUNTER — Ambulatory Visit (INDEPENDENT_AMBULATORY_CARE_PROVIDER_SITE_OTHER): Payer: Medicare Other | Admitting: Vascular Surgery

## 2014-07-30 ENCOUNTER — Ambulatory Visit (HOSPITAL_COMMUNITY)
Admission: RE | Admit: 2014-07-30 | Discharge: 2014-07-30 | Disposition: A | Discharge status: Home / Self Care | Payer: Medicare Other | Source: Ambulatory Visit | Attending: Vascular Surgery | Admitting: Vascular Surgery

## 2014-07-30 VITALS — BP 124/68 | HR 79 | Resp 18 | Ht 63.0 in | Wt 195.0 lb

## 2014-07-30 DIAGNOSIS — I83891 Varicose veins of right lower extremities with other complications: Secondary | ICD-10-CM

## 2014-07-30 NOTE — Progress Notes (Signed)
Here today for follow-up of her staged bilateral great saphenous vein laser ablation. Most recently this was her right leg on 07/17/2014 she did not make her one-week follow-up and is seeing [place] today. Portions she said that minimal discomfort with the right leg. She had a marked pain associated with similar treatment in her left leg. This is improving.  On physical exam she is much less erythema on her left leg than she did several weeks ago. Right leg has minimal erythema does have the typical thickening at the ablation site.  She underwent a duplex today shows no evidence of acute DVT. She does have some chronic DVT and her posterior tibial veins. She does have good ablation of her great saphenous vein throughout its length  Impression and plan: Stable follow-up after staged bilateral treatment for rate her saphenous vein venous hypertension. She will wear her compression when necessary basis and will see [place] as needed

## 2014-07-31 ENCOUNTER — Ambulatory Visit (INDEPENDENT_AMBULATORY_CARE_PROVIDER_SITE_OTHER): Payer: Medicare Other

## 2014-07-31 ENCOUNTER — Telehealth: Payer: Self-pay | Admitting: Family

## 2014-07-31 DIAGNOSIS — I82403 Acute embolism and thrombosis of unspecified deep veins of lower extremity, bilateral: Secondary | ICD-10-CM | POA: Diagnosis not present

## 2014-07-31 DIAGNOSIS — Z7901 Long term (current) use of anticoagulants: Secondary | ICD-10-CM | POA: Diagnosis not present

## 2014-07-31 DIAGNOSIS — D6859 Other primary thrombophilia: Secondary | ICD-10-CM

## 2014-07-31 DIAGNOSIS — D6852 Prothrombin gene mutation: Secondary | ICD-10-CM | POA: Diagnosis not present

## 2014-07-31 LAB — POCT INR: INR: 2

## 2014-07-31 NOTE — Telephone Encounter (Signed)
Agree with plan 

## 2014-09-02 ENCOUNTER — Telehealth: Payer: Self-pay | Admitting: Internal Medicine

## 2014-09-02 DIAGNOSIS — F418 Other specified anxiety disorders: Secondary | ICD-10-CM

## 2014-09-02 NOTE — Telephone Encounter (Signed)
Pt request refill for Xanax, offer an appt but pt stated she want [place] to send a massage first. Please advise

## 2014-09-09 MED ORDER — ALPRAZOLAM 1 MG PO TABS: 1.0000 mg | ORAL_TABLET | Freq: Three times a day (TID) | ORAL | Status: DC | PRN

## 2014-09-09 NOTE — Telephone Encounter (Signed)
Printed rx was mis placed. Called in rx per MD to [facility] in pt demographics.

## 2014-09-09 NOTE — Telephone Encounter (Signed)
done

## 2014-09-09 NOTE — Telephone Encounter (Signed)
Pt sched for coumadin clinic 12/29 in the afternoon, requesting to pick up xanex refill at that time? Pls advise (see 12/21 phone note).

## 2014-09-10 ENCOUNTER — Ambulatory Visit (INDEPENDENT_AMBULATORY_CARE_PROVIDER_SITE_OTHER): Payer: Medicare Other | Admitting: Family Medicine

## 2014-09-10 DIAGNOSIS — D6859 Other primary thrombophilia: Secondary | ICD-10-CM

## 2014-09-10 DIAGNOSIS — D6852 Prothrombin gene mutation: Secondary | ICD-10-CM | POA: Diagnosis not present

## 2014-09-10 DIAGNOSIS — I82403 Acute embolism and thrombosis of unspecified deep veins of lower extremity, bilateral: Secondary | ICD-10-CM

## 2014-09-10 DIAGNOSIS — Z7901 Long term (current) use of anticoagulants: Secondary | ICD-10-CM

## 2014-09-10 LAB — POCT INR: INR: 4.3

## 2014-09-18 DIAGNOSIS — M79609 Pain in unspecified limb: Secondary | ICD-10-CM | POA: Diagnosis not present

## 2014-09-18 DIAGNOSIS — Z1389 Encounter for screening for other disorder: Secondary | ICD-10-CM | POA: Diagnosis not present

## 2014-09-18 DIAGNOSIS — M542 Cervicalgia: Secondary | ICD-10-CM | POA: Diagnosis not present

## 2014-09-18 DIAGNOSIS — M5031 Other cervical disc degeneration,  high cervical region: Secondary | ICD-10-CM | POA: Diagnosis not present

## 2014-10-08 ENCOUNTER — Ambulatory Visit: Payer: Medicare Other

## 2014-10-16 DIAGNOSIS — G8929 Other chronic pain: Secondary | ICD-10-CM | POA: Diagnosis not present

## 2014-10-16 DIAGNOSIS — M5031 Other cervical disc degeneration,  high cervical region: Secondary | ICD-10-CM | POA: Diagnosis not present

## 2014-10-16 DIAGNOSIS — M542 Cervicalgia: Secondary | ICD-10-CM | POA: Diagnosis not present

## 2014-10-16 DIAGNOSIS — M79609 Pain in unspecified limb: Secondary | ICD-10-CM | POA: Diagnosis not present

## 2014-11-13 DIAGNOSIS — M542 Cervicalgia: Secondary | ICD-10-CM | POA: Diagnosis not present

## 2014-11-13 DIAGNOSIS — M5031 Other cervical disc degeneration,  high cervical region: Secondary | ICD-10-CM | POA: Diagnosis not present

## 2014-11-13 DIAGNOSIS — G8929 Other chronic pain: Secondary | ICD-10-CM | POA: Diagnosis not present

## 2014-11-13 DIAGNOSIS — M79609 Pain in unspecified limb: Secondary | ICD-10-CM | POA: Diagnosis not present

## 2014-12-10 ENCOUNTER — Other Ambulatory Visit: Payer: Self-pay | Admitting: Oncology

## 2014-12-11 DIAGNOSIS — G8929 Other chronic pain: Secondary | ICD-10-CM | POA: Diagnosis not present

## 2014-12-11 DIAGNOSIS — M79609 Pain in unspecified limb: Secondary | ICD-10-CM | POA: Diagnosis not present

## 2014-12-11 DIAGNOSIS — M542 Cervicalgia: Secondary | ICD-10-CM | POA: Diagnosis not present

## 2014-12-11 DIAGNOSIS — M5031 Other cervical disc degeneration,  high cervical region: Secondary | ICD-10-CM | POA: Diagnosis not present

## 2014-12-20 ENCOUNTER — Encounter (HOSPITAL_BASED_OUTPATIENT_CLINIC_OR_DEPARTMENT_OTHER): Payer: Medicare Other | Attending: Internal Medicine

## 2014-12-20 DIAGNOSIS — I739 Peripheral vascular disease, unspecified: Secondary | ICD-10-CM | POA: Insufficient documentation

## 2014-12-20 DIAGNOSIS — L97821 Non-pressure chronic ulcer of other part of left lower leg limited to breakdown of skin: Secondary | ICD-10-CM | POA: Insufficient documentation

## 2014-12-20 DIAGNOSIS — K529 Noninfective gastroenteritis and colitis, unspecified: Secondary | ICD-10-CM | POA: Insufficient documentation

## 2014-12-20 DIAGNOSIS — I87332 Chronic venous hypertension (idiopathic) with ulcer and inflammation of left lower extremity: Secondary | ICD-10-CM | POA: Insufficient documentation

## 2014-12-20 DIAGNOSIS — I82492 Acute embolism and thrombosis of other specified deep vein of left lower extremity: Secondary | ICD-10-CM | POA: Insufficient documentation

## 2014-12-25 ENCOUNTER — Other Ambulatory Visit: Payer: Self-pay | Admitting: Oncology

## 2014-12-28 ENCOUNTER — Telehealth: Payer: Self-pay | Admitting: *Deleted

## 2014-12-28 NOTE — Telephone Encounter (Signed)
Found note dated 07-24-2015 that Dr. Scarlette Calico with [facility] is monitoring patients coumadin.  Will notify Pharmacy.

## 2014-12-28 NOTE — Telephone Encounter (Signed)
Noted warfarin refill last filled October 2015 per [facility].  Patient seen in coumadin clinic on 09-10-2014.  No pending appointments with coumadin clinic or reassigned provider.  Awaiting return call from patient to clarify use & notified coumadin clinic with copy of refill request of this request.

## 2015-01-01 ENCOUNTER — Telehealth: Payer: Self-pay | Admitting: Internal Medicine

## 2015-01-01 NOTE — Telephone Encounter (Signed)
Scheduled appt with patient

## 2015-01-01 NOTE — Telephone Encounter (Signed)
Pt last seen 05/2014, need to be seen every 6 months for controlled

## 2015-01-01 NOTE — Telephone Encounter (Signed)
Patient is needs refill for ALPRAZolam Duanne Moron) 1 MG tablet [[id number]. Pharmacy is [facility] on [address].

## 2015-01-02 DIAGNOSIS — L97821 Non-pressure chronic ulcer of other part of left lower leg limited to breakdown of skin: Secondary | ICD-10-CM | POA: Diagnosis not present

## 2015-01-02 DIAGNOSIS — I87332 Chronic venous hypertension (idiopathic) with ulcer and inflammation of left lower extremity: Secondary | ICD-10-CM | POA: Diagnosis present

## 2015-01-02 DIAGNOSIS — K529 Noninfective gastroenteritis and colitis, unspecified: Secondary | ICD-10-CM | POA: Diagnosis not present

## 2015-01-02 DIAGNOSIS — I82492 Acute embolism and thrombosis of other specified deep vein of left lower extremity: Secondary | ICD-10-CM | POA: Diagnosis not present

## 2015-01-02 DIAGNOSIS — I739 Peripheral vascular disease, unspecified: Secondary | ICD-10-CM | POA: Diagnosis not present

## 2015-01-03 ENCOUNTER — Other Ambulatory Visit: Payer: Self-pay | Admitting: Internal Medicine

## 2015-01-06 ENCOUNTER — Ambulatory Visit: Payer: Medicare Other | Admitting: Internal Medicine

## 2015-01-06 DIAGNOSIS — M5031 Other cervical disc degeneration,  high cervical region: Secondary | ICD-10-CM | POA: Diagnosis not present

## 2015-01-06 DIAGNOSIS — G8929 Other chronic pain: Secondary | ICD-10-CM | POA: Diagnosis not present

## 2015-01-06 DIAGNOSIS — M79609 Pain in unspecified limb: Secondary | ICD-10-CM | POA: Diagnosis not present

## 2015-01-06 DIAGNOSIS — M542 Cervicalgia: Secondary | ICD-10-CM | POA: Diagnosis not present

## 2015-01-09 ENCOUNTER — Encounter: Payer: Self-pay | Admitting: Internal Medicine

## 2015-01-09 ENCOUNTER — Ambulatory Visit: Payer: Medicare Other | Admitting: Internal Medicine

## 2015-01-09 ENCOUNTER — Other Ambulatory Visit (INDEPENDENT_AMBULATORY_CARE_PROVIDER_SITE_OTHER): Payer: Medicare Other

## 2015-01-09 ENCOUNTER — Ambulatory Visit (INDEPENDENT_AMBULATORY_CARE_PROVIDER_SITE_OTHER): Payer: Medicare Other | Admitting: Internal Medicine

## 2015-01-09 VITALS — BP 148/92 | HR 83 | Temp 98.2°F | Resp 16 | Ht 63.0 in | Wt 184.0 lb

## 2015-01-09 DIAGNOSIS — I10 Essential (primary) hypertension: Secondary | ICD-10-CM

## 2015-01-09 DIAGNOSIS — D6859 Other primary thrombophilia: Secondary | ICD-10-CM

## 2015-01-09 DIAGNOSIS — E7800 Pure hypercholesterolemia, unspecified: Secondary | ICD-10-CM

## 2015-01-09 DIAGNOSIS — D6852 Prothrombin gene mutation: Secondary | ICD-10-CM | POA: Diagnosis not present

## 2015-01-09 DIAGNOSIS — I82403 Acute embolism and thrombosis of unspecified deep veins of lower extremity, bilateral: Secondary | ICD-10-CM | POA: Diagnosis not present

## 2015-01-09 DIAGNOSIS — M4682 Other specified inflammatory spondylopathies, cervical region: Secondary | ICD-10-CM

## 2015-01-09 DIAGNOSIS — I82492 Acute embolism and thrombosis of other specified deep vein of left lower extremity: Secondary | ICD-10-CM | POA: Diagnosis not present

## 2015-01-09 DIAGNOSIS — Z7901 Long term (current) use of anticoagulants: Secondary | ICD-10-CM

## 2015-01-09 DIAGNOSIS — E785 Hyperlipidemia, unspecified: Secondary | ICD-10-CM | POA: Diagnosis not present

## 2015-01-09 DIAGNOSIS — B351 Tinea unguium: Secondary | ICD-10-CM | POA: Insufficient documentation

## 2015-01-09 DIAGNOSIS — M4722 Other spondylosis with radiculopathy, cervical region: Secondary | ICD-10-CM

## 2015-01-09 DIAGNOSIS — L97821 Non-pressure chronic ulcer of other part of left lower leg limited to breakdown of skin: Secondary | ICD-10-CM | POA: Diagnosis not present

## 2015-01-09 DIAGNOSIS — K529 Noninfective gastroenteritis and colitis, unspecified: Secondary | ICD-10-CM | POA: Diagnosis not present

## 2015-01-09 DIAGNOSIS — I739 Peripheral vascular disease, unspecified: Secondary | ICD-10-CM | POA: Diagnosis not present

## 2015-01-09 DIAGNOSIS — M4692 Unspecified inflammatory spondylopathy, cervical region: Secondary | ICD-10-CM

## 2015-01-09 DIAGNOSIS — I87332 Chronic venous hypertension (idiopathic) with ulcer and inflammation of left lower extremity: Secondary | ICD-10-CM | POA: Diagnosis not present

## 2015-01-09 DIAGNOSIS — F418 Other specified anxiety disorders: Secondary | ICD-10-CM

## 2015-01-09 DIAGNOSIS — I83222 Varicose veins of left lower extremity with both ulcer of calf and inflammation: Secondary | ICD-10-CM | POA: Diagnosis not present

## 2015-01-09 DIAGNOSIS — M5412 Radiculopathy, cervical region: Secondary | ICD-10-CM

## 2015-01-09 LAB — URINALYSIS, ROUTINE W REFLEX MICROSCOPIC
BILIRUBIN URINE: NEGATIVE
HGB URINE DIPSTICK: NEGATIVE
KETONES UR: NEGATIVE
Leukocytes, UA: NEGATIVE
Nitrite: NEGATIVE
RBC / HPF: NONE SEEN (ref 0–?)
Specific Gravity, Urine: 1.01 (ref 1.000–1.030)
TOTAL PROTEIN, URINE-UPE24: NEGATIVE
URINE GLUCOSE: NEGATIVE
UROBILINOGEN UA: 0.2 (ref 0.0–1.0)
WBC, UA: NONE SEEN (ref 0–?)
pH: 7 (ref 5.0–8.0)

## 2015-01-09 LAB — CBC WITH DIFFERENTIAL/PLATELET
BASOS ABS: 0 10*3/uL (ref 0.0–0.1)
Basophils Relative: 0.2 % (ref 0.0–3.0)
EOS PCT: 2.8 % (ref 0.0–5.0)
Eosinophils Absolute: 0.3 10*3/uL (ref 0.0–0.7)
HCT: 46.1 % — ABNORMAL HIGH (ref 36.0–46.0)
Hemoglobin: 16 g/dL — ABNORMAL HIGH (ref 12.0–15.0)
LYMPHS ABS: 2.3 10*3/uL (ref 0.7–4.0)
LYMPHS PCT: 24.9 % (ref 12.0–46.0)
MCHC: 34.6 g/dL (ref 30.0–36.0)
MCV: 92.7 fl (ref 78.0–100.0)
Monocytes Absolute: 0.1 10*3/uL (ref 0.1–1.0)
Monocytes Relative: 1.5 % — ABNORMAL LOW (ref 3.0–12.0)
NEUTROS PCT: 70.6 % (ref 43.0–77.0)
Neutro Abs: 6.4 10*3/uL (ref 1.4–7.7)
Platelets: 260 10*3/uL (ref 150.0–400.0)
RBC: 4.97 Mil/uL (ref 3.87–5.11)
RDW: 13.6 % (ref 11.5–15.5)
WBC: 9.1 10*3/uL (ref 4.0–10.5)

## 2015-01-09 LAB — LIPID PANEL
CHOLESTEROL: 170 mg/dL (ref 0–200)
HDL: 43.2 mg/dL (ref >39.00)
LDL CALC: 101 mg/dL — AB (ref 0–99)
NonHDL: 126.8
TRIGLYCERIDES: 130 mg/dL (ref 0.0–149.0)
Total CHOL/HDL Ratio: 4
VLDL: 26 mg/dL (ref 0.0–40.0)

## 2015-01-09 LAB — COMPREHENSIVE METABOLIC PANEL
ALK PHOS: 105 U/L (ref 39–117)
ALT: 9 U/L (ref 0–35)
AST: 17 U/L (ref 0–37)
Albumin: 4.1 g/dL (ref 3.5–5.2)
BUN: 4 mg/dL — ABNORMAL LOW (ref 6–23)
CO2: 29 mEq/L (ref 19–32)
Calcium: 9.5 mg/dL (ref 8.4–10.5)
Chloride: 102 mEq/L (ref 96–112)
Creatinine, Ser: 0.63 mg/dL (ref 0.40–1.20)
GFR: 103.01 mL/min (ref >60.00)
Glucose, Bld: 95 mg/dL (ref 70–99)
POTASSIUM: 3.5 meq/L (ref 3.5–5.1)
SODIUM: 140 meq/L (ref 135–145)
Total Bilirubin: 0.7 mg/dL (ref 0.2–1.2)
Total Protein: 7.8 g/dL (ref 6.0–8.3)

## 2015-01-09 LAB — PROTIME-INR
INR: 1 ratio (ref 0.8–1.0)
Prothrombin Time: 11 s (ref 9.6–13.1)

## 2015-01-09 LAB — TSH: TSH: 1.02 u[IU]/mL (ref 0.35–4.50)

## 2015-01-09 MED ORDER — WARFARIN SODIUM 5 MG PO TABS: 5.0000 mg | ORAL_TABLET | Freq: Every day | ORAL | Status: DC

## 2015-01-09 MED ORDER — ALPRAZOLAM 1 MG PO TABS: 1.0000 mg | ORAL_TABLET | Freq: Three times a day (TID) | ORAL | Status: DC | PRN

## 2015-01-09 MED ORDER — TERBINAFINE HCL 250 MG PO TABS: 250.0000 mg | ORAL_TABLET | Freq: Every day | ORAL | Status: DC

## 2015-01-09 NOTE — Progress Notes (Signed)
Pre visit review using our clinic review tool, if applicable. No additional management support is needed unless otherwise documented below in the visit note. 

## 2015-01-09 NOTE — Patient Instructions (Signed)

## 2015-01-09 NOTE — Progress Notes (Signed)
Subjective:    Patient ID: Brooke Garcia, female    DOB: 18-Feb-1956, 59 y.o.   MRN: [id number]  HPI Comments: She complains of ugly and uncomfortable toenails for over a year, she has tried a topical treatment with no success. She wants to try the oral option to treat this.  Hypertension This is a chronic problem. The current episode started more than 1 year ago. The problem is uncontrolled. Associated symptoms include anxiety. Pertinent negatives include no blurred vision, chest pain, headaches, malaise/fatigue, neck pain, orthopnea, palpitations, peripheral edema, PND, shortness of breath or sweats. Agents associated with hypertension include NSAIDs. Risk factors for coronary artery disease include smoking/tobacco exposure. Past treatments include calcium channel blockers. The current treatment provides moderate improvement. Compliance problems include diet and exercise.       Review of Systems  Constitutional: Negative.  Negative for fever, chills, malaise/fatigue, diaphoresis, appetite change and fatigue.  HENT: Negative.   Eyes: Negative.  Negative for blurred vision.  Respiratory: Negative.  Negative for cough, choking, chest tightness, shortness of breath and stridor.   Cardiovascular: Negative.  Negative for chest pain, palpitations, orthopnea, leg swelling and PND.  Gastrointestinal: Negative.  Negative for vomiting, abdominal pain, diarrhea, constipation and blood in stool.  Endocrine: Negative.   Genitourinary: Negative.  Negative for hematuria.  Musculoskeletal: Positive for arthralgias. Negative for myalgias, back pain and neck pain.  Skin: Negative.  Negative for rash.  Allergic/Immunologic: Negative.   Neurological: Negative.  Negative for dizziness and headaches.  Hematological: Negative.  Negative for adenopathy. Does not bruise/bleed easily.  Psychiatric/Behavioral: Negative.        Objective:   Physical Exam  Constitutional: She is oriented to person, place, and  time. She appears well-developed and well-nourished. No distress.  HENT:  Head: Normocephalic and atraumatic.  Mouth/Throat: Oropharynx is clear and moist. No oropharyngeal exudate.  Eyes: Conjunctivae are normal. Right eye exhibits no discharge. Left eye exhibits no discharge. No scleral icterus.  Neck: Normal range of motion. Neck supple. No JVD present. No tracheal deviation present. No thyromegaly present.  Cardiovascular: Normal rate, normal heart sounds and intact distal pulses.  Exam reveals no gallop and no friction rub.   No murmur heard. Pulmonary/Chest: Effort normal and breath sounds normal. No stridor. No respiratory distress. She has no wheezes. She has no rales. She exhibits no tenderness.  Abdominal: Soft. Bowel sounds are normal. She exhibits no distension and no mass. There is no tenderness. There is no rebound and no guarding.  Musculoskeletal: Normal range of motion. She exhibits no edema or tenderness.  Lymphadenopathy:    She has no cervical adenopathy.  Neurological: She is oriented to person, place, and time.  Skin: Skin is warm and dry. No rash noted. She is not diaphoretic. No erythema. No pallor.  7/10 toenails show lysis with nail thickening and subungual debris  Vitals reviewed.    Lab Results  Component Value Date   WBC 8.3 12/24/2013   HGB 14.7 12/24/2013   HCT 42.9 12/24/2013   PLT 229.0 12/24/2013   GLUCOSE 92 12/24/2013   CHOL 172 12/24/2013   TRIG 88.0 12/24/2013   HDL 50.20 12/24/2013   LDLCALC 104* 12/24/2013   ALT 25 12/24/2013   AST 32 12/24/2013   NA 138 12/24/2013   K 3.6 12/24/2013   CL 104 12/24/2013   CREATININE 0.7 12/24/2013   BUN 10 12/24/2013   CO2 25 12/24/2013   TSH 1.44 12/24/2013   INR 4.3 09/10/2014  Assessment & Plan:

## 2015-01-12 ENCOUNTER — Encounter: Payer: Self-pay | Admitting: Internal Medicine

## 2015-01-12 NOTE — Assessment & Plan Note (Signed)
She will cont percocet as needed for pain

## 2015-01-12 NOTE — Assessment & Plan Note (Signed)
Her INR is sub-therapeutic, she admits to not taking coumadin for several weeks Will restart coumadin Will recheck her INR in 3 weeks, will monitor closely since she is starting an antifungal medication

## 2015-01-12 NOTE — Assessment & Plan Note (Signed)
She is not willing to start a statin

## 2015-01-12 NOTE — Assessment & Plan Note (Signed)
Her LFT's are WNL Will start terbinafine She will RTC in 1 month for a recheck on the LFT's

## 2015-01-12 NOTE — Assessment & Plan Note (Signed)
Her BP is relatively well controlled She is not willing to add another agent She will try to quit smoking and will work to improve her lifestyle modifications

## 2015-01-14 ENCOUNTER — Other Ambulatory Visit: Payer: Self-pay | Admitting: Internal Medicine

## 2015-01-16 ENCOUNTER — Encounter (HOSPITAL_BASED_OUTPATIENT_CLINIC_OR_DEPARTMENT_OTHER): Payer: Medicare Other | Attending: Internal Medicine

## 2015-01-16 DIAGNOSIS — L97929 Non-pressure chronic ulcer of unspecified part of left lower leg with unspecified severity: Secondary | ICD-10-CM | POA: Insufficient documentation

## 2015-01-16 DIAGNOSIS — I83222 Varicose veins of left lower extremity with both ulcer of calf and inflammation: Secondary | ICD-10-CM | POA: Insufficient documentation

## 2015-01-30 DIAGNOSIS — L97929 Non-pressure chronic ulcer of unspecified part of left lower leg with unspecified severity: Secondary | ICD-10-CM | POA: Diagnosis not present

## 2015-01-30 DIAGNOSIS — I83222 Varicose veins of left lower extremity with both ulcer of calf and inflammation: Secondary | ICD-10-CM | POA: Diagnosis not present

## 2015-02-14 ENCOUNTER — Encounter (HOSPITAL_BASED_OUTPATIENT_CLINIC_OR_DEPARTMENT_OTHER): Payer: Medicare Other | Attending: Internal Medicine

## 2015-02-14 DIAGNOSIS — I872 Venous insufficiency (chronic) (peripheral): Secondary | ICD-10-CM | POA: Diagnosis not present

## 2015-02-14 DIAGNOSIS — I10 Essential (primary) hypertension: Secondary | ICD-10-CM | POA: Insufficient documentation

## 2015-02-14 DIAGNOSIS — I83222 Varicose veins of left lower extremity with both ulcer of calf and inflammation: Secondary | ICD-10-CM | POA: Diagnosis not present

## 2015-02-14 DIAGNOSIS — Z86718 Personal history of other venous thrombosis and embolism: Secondary | ICD-10-CM | POA: Diagnosis not present

## 2015-02-14 DIAGNOSIS — F17200 Nicotine dependence, unspecified, uncomplicated: Secondary | ICD-10-CM | POA: Diagnosis not present

## 2015-02-14 DIAGNOSIS — I739 Peripheral vascular disease, unspecified: Secondary | ICD-10-CM | POA: Insufficient documentation

## 2015-02-14 DIAGNOSIS — L97821 Non-pressure chronic ulcer of other part of left lower leg limited to breakdown of skin: Secondary | ICD-10-CM | POA: Insufficient documentation

## 2015-02-21 ENCOUNTER — Encounter (HOSPITAL_BASED_OUTPATIENT_CLINIC_OR_DEPARTMENT_OTHER): Payer: Medicare Other | Attending: Internal Medicine

## 2015-02-21 DIAGNOSIS — I872 Venous insufficiency (chronic) (peripheral): Secondary | ICD-10-CM | POA: Insufficient documentation

## 2015-02-21 DIAGNOSIS — L97821 Non-pressure chronic ulcer of other part of left lower leg limited to breakdown of skin: Secondary | ICD-10-CM | POA: Diagnosis not present

## 2015-02-27 ENCOUNTER — Other Ambulatory Visit: Payer: Self-pay | Admitting: Internal Medicine

## 2015-02-27 DIAGNOSIS — I872 Venous insufficiency (chronic) (peripheral): Secondary | ICD-10-CM | POA: Diagnosis not present

## 2015-02-27 DIAGNOSIS — L97821 Non-pressure chronic ulcer of other part of left lower leg limited to breakdown of skin: Secondary | ICD-10-CM | POA: Diagnosis not present

## 2015-03-03 DIAGNOSIS — M5031 Other cervical disc degeneration,  high cervical region: Secondary | ICD-10-CM | POA: Diagnosis not present

## 2015-03-03 DIAGNOSIS — Z79899 Other long term (current) drug therapy: Secondary | ICD-10-CM | POA: Diagnosis not present

## 2015-03-03 DIAGNOSIS — Z9114 Patient's other noncompliance with medication regimen: Secondary | ICD-10-CM | POA: Diagnosis not present

## 2015-03-03 DIAGNOSIS — G8929 Other chronic pain: Secondary | ICD-10-CM | POA: Diagnosis not present

## 2015-03-03 DIAGNOSIS — M79609 Pain in unspecified limb: Secondary | ICD-10-CM | POA: Diagnosis not present

## 2015-03-03 DIAGNOSIS — M542 Cervicalgia: Secondary | ICD-10-CM | POA: Diagnosis not present

## 2015-03-06 DIAGNOSIS — I872 Venous insufficiency (chronic) (peripheral): Secondary | ICD-10-CM | POA: Diagnosis not present

## 2015-03-06 DIAGNOSIS — L97821 Non-pressure chronic ulcer of other part of left lower leg limited to breakdown of skin: Secondary | ICD-10-CM | POA: Diagnosis not present

## 2015-03-13 DIAGNOSIS — I872 Venous insufficiency (chronic) (peripheral): Secondary | ICD-10-CM | POA: Diagnosis not present

## 2015-03-13 DIAGNOSIS — L97821 Non-pressure chronic ulcer of other part of left lower leg limited to breakdown of skin: Secondary | ICD-10-CM | POA: Diagnosis not present

## 2015-03-20 ENCOUNTER — Encounter (HOSPITAL_BASED_OUTPATIENT_CLINIC_OR_DEPARTMENT_OTHER): Payer: Medicare Other | Attending: Internal Medicine

## 2015-03-20 DIAGNOSIS — I10 Essential (primary) hypertension: Secondary | ICD-10-CM | POA: Insufficient documentation

## 2015-03-20 DIAGNOSIS — I83222 Varicose veins of left lower extremity with both ulcer of calf and inflammation: Secondary | ICD-10-CM | POA: Insufficient documentation

## 2015-03-20 DIAGNOSIS — Z86718 Personal history of other venous thrombosis and embolism: Secondary | ICD-10-CM | POA: Insufficient documentation

## 2015-03-20 DIAGNOSIS — L97229 Non-pressure chronic ulcer of left calf with unspecified severity: Secondary | ICD-10-CM | POA: Diagnosis not present

## 2015-03-20 DIAGNOSIS — I739 Peripheral vascular disease, unspecified: Secondary | ICD-10-CM | POA: Insufficient documentation

## 2015-03-20 DIAGNOSIS — I878 Other specified disorders of veins: Secondary | ICD-10-CM | POA: Diagnosis not present

## 2015-03-28 DIAGNOSIS — I83222 Varicose veins of left lower extremity with both ulcer of calf and inflammation: Secondary | ICD-10-CM | POA: Diagnosis not present

## 2015-03-28 DIAGNOSIS — I878 Other specified disorders of veins: Secondary | ICD-10-CM | POA: Diagnosis not present

## 2015-03-28 DIAGNOSIS — L97229 Non-pressure chronic ulcer of left calf with unspecified severity: Secondary | ICD-10-CM | POA: Diagnosis not present

## 2015-03-28 DIAGNOSIS — I739 Peripheral vascular disease, unspecified: Secondary | ICD-10-CM | POA: Diagnosis not present

## 2015-03-28 DIAGNOSIS — I10 Essential (primary) hypertension: Secondary | ICD-10-CM | POA: Diagnosis not present

## 2015-03-28 DIAGNOSIS — Z86718 Personal history of other venous thrombosis and embolism: Secondary | ICD-10-CM | POA: Diagnosis not present

## 2015-04-11 DIAGNOSIS — L97229 Non-pressure chronic ulcer of left calf with unspecified severity: Secondary | ICD-10-CM | POA: Diagnosis not present

## 2015-04-11 DIAGNOSIS — Z86718 Personal history of other venous thrombosis and embolism: Secondary | ICD-10-CM | POA: Diagnosis not present

## 2015-04-11 DIAGNOSIS — I83222 Varicose veins of left lower extremity with both ulcer of calf and inflammation: Secondary | ICD-10-CM | POA: Diagnosis not present

## 2015-04-11 DIAGNOSIS — I10 Essential (primary) hypertension: Secondary | ICD-10-CM | POA: Diagnosis not present

## 2015-04-11 DIAGNOSIS — I878 Other specified disorders of veins: Secondary | ICD-10-CM | POA: Diagnosis not present

## 2015-04-11 DIAGNOSIS — I739 Peripheral vascular disease, unspecified: Secondary | ICD-10-CM | POA: Diagnosis not present

## 2015-04-18 ENCOUNTER — Encounter (HOSPITAL_BASED_OUTPATIENT_CLINIC_OR_DEPARTMENT_OTHER): Payer: Medicare Other | Attending: Internal Medicine

## 2015-04-18 DIAGNOSIS — I10 Essential (primary) hypertension: Secondary | ICD-10-CM | POA: Diagnosis not present

## 2015-04-18 DIAGNOSIS — L97221 Non-pressure chronic ulcer of left calf limited to breakdown of skin: Secondary | ICD-10-CM | POA: Insufficient documentation

## 2015-04-18 DIAGNOSIS — I739 Peripheral vascular disease, unspecified: Secondary | ICD-10-CM | POA: Insufficient documentation

## 2015-04-18 DIAGNOSIS — I83222 Varicose veins of left lower extremity with both ulcer of calf and inflammation: Secondary | ICD-10-CM | POA: Insufficient documentation

## 2015-04-25 DIAGNOSIS — I83222 Varicose veins of left lower extremity with both ulcer of calf and inflammation: Secondary | ICD-10-CM | POA: Diagnosis not present

## 2015-04-25 DIAGNOSIS — L97221 Non-pressure chronic ulcer of left calf limited to breakdown of skin: Secondary | ICD-10-CM | POA: Diagnosis not present

## 2015-04-25 DIAGNOSIS — I10 Essential (primary) hypertension: Secondary | ICD-10-CM | POA: Diagnosis not present

## 2015-04-25 DIAGNOSIS — I739 Peripheral vascular disease, unspecified: Secondary | ICD-10-CM | POA: Diagnosis not present

## 2015-04-28 DIAGNOSIS — M5031 Other cervical disc degeneration,  high cervical region: Secondary | ICD-10-CM | POA: Diagnosis not present

## 2015-04-28 DIAGNOSIS — M542 Cervicalgia: Secondary | ICD-10-CM | POA: Diagnosis not present

## 2015-04-28 DIAGNOSIS — M79609 Pain in unspecified limb: Secondary | ICD-10-CM | POA: Diagnosis not present

## 2015-04-28 DIAGNOSIS — G8929 Other chronic pain: Secondary | ICD-10-CM | POA: Diagnosis not present

## 2015-04-30 ENCOUNTER — Telehealth: Payer: Self-pay | Admitting: Internal Medicine

## 2015-04-30 DIAGNOSIS — M5412 Radiculopathy, cervical region: Principal | ICD-10-CM

## 2015-04-30 DIAGNOSIS — M4692 Unspecified inflammatory spondylopathy, cervical region: Secondary | ICD-10-CM

## 2015-04-30 NOTE — Telephone Encounter (Signed)
Please advise 

## 2015-04-30 NOTE — Telephone Encounter (Signed)
Referral sent 

## 2015-04-30 NOTE — Telephone Encounter (Signed)
Pt called regarding being in a lot of pain from her venous ulcers. She has been going to the wound clinic for about 4 mos and they can't prescribe her any pain medicine. She is assuming that you would not be able to do this either, but would like to be referred to pain clinic. Will she need to come in for an OV for this referral. Please advise

## 2015-05-02 DIAGNOSIS — I83222 Varicose veins of left lower extremity with both ulcer of calf and inflammation: Secondary | ICD-10-CM | POA: Diagnosis not present

## 2015-05-02 DIAGNOSIS — L97221 Non-pressure chronic ulcer of left calf limited to breakdown of skin: Secondary | ICD-10-CM | POA: Diagnosis not present

## 2015-05-02 DIAGNOSIS — I10 Essential (primary) hypertension: Secondary | ICD-10-CM | POA: Diagnosis not present

## 2015-05-02 DIAGNOSIS — I739 Peripheral vascular disease, unspecified: Secondary | ICD-10-CM | POA: Diagnosis not present

## 2015-05-08 ENCOUNTER — Encounter (HOSPITAL_COMMUNITY): Payer: Self-pay

## 2015-05-08 ENCOUNTER — Emergency Department (HOSPITAL_BASED_OUTPATIENT_CLINIC_OR_DEPARTMENT_OTHER)
Admission: EM | Admit: 2015-05-08 | Discharge: 2015-05-08 | Disposition: A | Discharge status: Home / Self Care | Payer: Medicare Other | Source: Home / Self Care

## 2015-05-08 ENCOUNTER — Emergency Department (HOSPITAL_COMMUNITY): Payer: Medicare Other

## 2015-05-08 ENCOUNTER — Inpatient Hospital Stay (HOSPITAL_COMMUNITY)
Admission: EM | Admit: 2015-05-08 | Discharge: 2015-05-12 | DRG: 175 | Disposition: A | Discharge status: Home / Self Care | Payer: Medicare Other | Attending: Internal Medicine | Admitting: Internal Medicine

## 2015-05-08 DIAGNOSIS — Z801 Family history of malignant neoplasm of trachea, bronchus and lung: Secondary | ICD-10-CM | POA: Diagnosis not present

## 2015-05-08 DIAGNOSIS — Z820 Family history of epilepsy and other diseases of the nervous system: Secondary | ICD-10-CM

## 2015-05-08 DIAGNOSIS — I2699 Other pulmonary embolism without acute cor pulmonale: Principal | ICD-10-CM | POA: Diagnosis present

## 2015-05-08 DIAGNOSIS — D6859 Other primary thrombophilia: Secondary | ICD-10-CM | POA: Diagnosis present

## 2015-05-08 DIAGNOSIS — Z8249 Family history of ischemic heart disease and other diseases of the circulatory system: Secondary | ICD-10-CM

## 2015-05-08 DIAGNOSIS — Z803 Family history of malignant neoplasm of breast: Secondary | ICD-10-CM | POA: Diagnosis not present

## 2015-05-08 DIAGNOSIS — Z7901 Long term (current) use of anticoagulants: Secondary | ICD-10-CM | POA: Diagnosis not present

## 2015-05-08 DIAGNOSIS — M79604 Pain in right leg: Secondary | ICD-10-CM | POA: Diagnosis not present

## 2015-05-08 DIAGNOSIS — Z823 Family history of stroke: Secondary | ICD-10-CM

## 2015-05-08 DIAGNOSIS — L97929 Non-pressure chronic ulcer of unspecified part of left lower leg with unspecified severity: Secondary | ICD-10-CM | POA: Diagnosis present

## 2015-05-08 DIAGNOSIS — Z9119 Patient's noncompliance with other medical treatment and regimen: Secondary | ICD-10-CM | POA: Diagnosis present

## 2015-05-08 DIAGNOSIS — J189 Pneumonia, unspecified organism: Secondary | ICD-10-CM | POA: Diagnosis present

## 2015-05-08 DIAGNOSIS — Z9114 Patient's other noncompliance with medication regimen: Secondary | ICD-10-CM | POA: Diagnosis present

## 2015-05-08 DIAGNOSIS — F17210 Nicotine dependence, cigarettes, uncomplicated: Secondary | ICD-10-CM | POA: Diagnosis present

## 2015-05-08 DIAGNOSIS — I82501 Chronic embolism and thrombosis of unspecified deep veins of right lower extremity: Secondary | ICD-10-CM | POA: Diagnosis not present

## 2015-05-08 DIAGNOSIS — R079 Chest pain, unspecified: Secondary | ICD-10-CM | POA: Diagnosis not present

## 2015-05-08 DIAGNOSIS — Z833 Family history of diabetes mellitus: Secondary | ICD-10-CM | POA: Diagnosis not present

## 2015-05-08 DIAGNOSIS — Z800 Family history of malignant neoplasm of digestive organs: Secondary | ICD-10-CM | POA: Diagnosis not present

## 2015-05-08 DIAGNOSIS — I83892 Varicose veins of left lower extremities with other complications: Secondary | ICD-10-CM | POA: Diagnosis not present

## 2015-05-08 DIAGNOSIS — F411 Generalized anxiety disorder: Secondary | ICD-10-CM | POA: Diagnosis present

## 2015-05-08 DIAGNOSIS — M79609 Pain in unspecified limb: Secondary | ICD-10-CM | POA: Diagnosis not present

## 2015-05-08 DIAGNOSIS — J449 Chronic obstructive pulmonary disease, unspecified: Secondary | ICD-10-CM | POA: Diagnosis present

## 2015-05-08 DIAGNOSIS — E785 Hyperlipidemia, unspecified: Secondary | ICD-10-CM | POA: Diagnosis present

## 2015-05-08 DIAGNOSIS — I82403 Acute embolism and thrombosis of unspecified deep veins of lower extremity, bilateral: Secondary | ICD-10-CM | POA: Diagnosis present

## 2015-05-08 DIAGNOSIS — I82401 Acute embolism and thrombosis of unspecified deep veins of right lower extremity: Secondary | ICD-10-CM

## 2015-05-08 DIAGNOSIS — I10 Essential (primary) hypertension: Secondary | ICD-10-CM | POA: Diagnosis not present

## 2015-05-08 DIAGNOSIS — IMO0001 Reserved for inherently not codable concepts without codable children: Secondary | ICD-10-CM

## 2015-05-08 DIAGNOSIS — F17200 Nicotine dependence, unspecified, uncomplicated: Secondary | ICD-10-CM

## 2015-05-08 LAB — PROTIME-INR
INR: 1.39 (ref 0.00–1.49)
PROTHROMBIN TIME: 17.2 s — AB (ref 11.6–15.2)

## 2015-05-08 LAB — MRSA PCR SCREENING: MRSA BY PCR: POSITIVE — AB

## 2015-05-08 LAB — BASIC METABOLIC PANEL
ANION GAP: 8 (ref 5–15)
BUN: 5 mg/dL — ABNORMAL LOW (ref 6–20)
CHLORIDE: 101 mmol/L (ref 101–111)
CO2: 25 mmol/L (ref 22–32)
Calcium: 8.4 mg/dL — ABNORMAL LOW (ref 8.9–10.3)
Creatinine, Ser: 0.71 mg/dL (ref 0.44–1.00)
GFR calc Af Amer: >60 mL/min (ref >60)
Glucose, Bld: 121 mg/dL — ABNORMAL HIGH (ref 65–99)
POTASSIUM: 3.2 mmol/L — AB (ref 3.5–5.1)
SODIUM: 134 mmol/L — AB (ref 135–145)

## 2015-05-08 LAB — CBC
HCT: 38.9 % (ref 36.0–46.0)
Hemoglobin: 13.1 g/dL (ref 12.0–15.0)
MCH: 33.7 pg (ref 26.0–34.0)
MCHC: 33.7 g/dL (ref 30.0–36.0)
MCV: 100 fL (ref 78.0–100.0)
PLATELETS: 189 10*3/uL (ref 150–400)
RBC: 3.89 MIL/uL (ref 3.87–5.11)
RDW: 14.6 % (ref 11.5–15.5)
WBC: 10.2 10*3/uL (ref 4.0–10.5)

## 2015-05-08 LAB — I-STAT TROPONIN, ED: Troponin i, poc: 0 ng/mL (ref 0.00–0.08)

## 2015-05-08 MED ORDER — HEPARIN (PORCINE) IN NACL 100-0.45 UNIT/ML-% IJ SOLN: 1200.0000 [IU]/h | INTRAMUSCULAR | Status: DC

## 2015-05-08 MED ORDER — MORPHINE SULFATE (PF) 4 MG/ML IV SOLN
4.0000 mg | Freq: Once | INTRAVENOUS | Status: AC
  Administered 2015-05-08: 4 mg via INTRAVENOUS
  Filled 2015-05-08: qty 1

## 2015-05-08 MED ORDER — PANTOPRAZOLE SODIUM 40 MG PO TBEC
40.0000 mg | DELAYED_RELEASE_TABLET | Freq: Every day | ORAL | Status: DC
  Administered 2015-05-09 – 2015-05-12 (×4): 40 mg via ORAL
  Filled 2015-05-08 (×4): qty 1

## 2015-05-08 MED ORDER — CHLORHEXIDINE GLUCONATE CLOTH 2 % EX PADS
6.0000 | MEDICATED_PAD | Freq: Every morning | CUTANEOUS | Status: DC
  Administered 2015-05-09 – 2015-05-11 (×3): 6 via TOPICAL

## 2015-05-08 MED ORDER — MOMETASONE FURO-FORMOTEROL FUM 200-5 MCG/ACT IN AERO
2.0000 | INHALATION_SPRAY | Freq: Two times a day (BID) | RESPIRATORY_TRACT | Status: DC
  Administered 2015-05-08 – 2015-05-12 (×7): 2 via RESPIRATORY_TRACT
  Filled 2015-05-08: qty 8.8

## 2015-05-08 MED ORDER — ALPRAZOLAM 1 MG PO TABS
1.0000 mg | ORAL_TABLET | Freq: Three times a day (TID) | ORAL | Status: DC | PRN
  Administered 2015-05-08 – 2015-05-12 (×6): 1 mg via ORAL
  Filled 2015-05-08 (×7): qty 1

## 2015-05-08 MED ORDER — COLLAGENASE 250 UNIT/GM EX OINT
TOPICAL_OINTMENT | Freq: Every day | CUTANEOUS | Status: DC
  Administered 2015-05-08 – 2015-05-10 (×3): via TOPICAL
  Filled 2015-05-08 (×2): qty 30

## 2015-05-08 MED ORDER — OXYCODONE HCL 5 MG PO TABS
10.0000 mg | ORAL_TABLET | Freq: Once | ORAL | Status: AC
  Administered 2015-05-08: 10 mg via ORAL
  Filled 2015-05-08: qty 2

## 2015-05-08 MED ORDER — POTASSIUM CHLORIDE CRYS ER 20 MEQ PO TBCR
40.0000 meq | EXTENDED_RELEASE_TABLET | Freq: Once | ORAL | Status: AC
  Administered 2015-05-08: 40 meq via ORAL
  Filled 2015-05-08: qty 2

## 2015-05-08 MED ORDER — TIOTROPIUM BROMIDE MONOHYDRATE 18 MCG IN CAPS
18.0000 ug | ORAL_CAPSULE | Freq: Every day | RESPIRATORY_TRACT | Status: DC
  Filled 2015-05-08: qty 5

## 2015-05-08 MED ORDER — MORPHINE SULFATE (PF) 4 MG/ML IV SOLN
4.0000 mg | INTRAVENOUS | Status: DC | PRN
  Administered 2015-05-08: 4 mg via INTRAVENOUS
  Filled 2015-05-08: qty 1

## 2015-05-08 MED ORDER — SODIUM CHLORIDE 0.9 % IJ SOLN
3.0000 mL | Freq: Two times a day (BID) | INTRAMUSCULAR | Status: DC
  Administered 2015-05-09 – 2015-05-10 (×4): 3 mL via INTRAVENOUS

## 2015-05-08 MED ORDER — NICOTINE 21 MG/24HR TD PT24
21.0000 mg | MEDICATED_PATCH | Freq: Once | TRANSDERMAL | Status: AC
  Administered 2015-05-08: 21 mg via TRANSDERMAL
  Filled 2015-05-08: qty 1

## 2015-05-08 MED ORDER — IOHEXOL 350 MG/ML SOLN
100.0000 mL | Freq: Once | INTRAVENOUS | Status: AC | PRN
  Administered 2015-05-08: 150 mL via INTRAVENOUS

## 2015-05-08 MED ORDER — LIDOCAINE 5 % EX OINT
TOPICAL_OINTMENT | Freq: Three times a day (TID) | CUTANEOUS | Status: DC | PRN
  Administered 2015-05-08 – 2015-05-11 (×2): via TOPICAL
  Filled 2015-05-08: qty 35.44

## 2015-05-08 MED ORDER — HEPARIN (PORCINE) IN NACL 100-0.45 UNIT/ML-% IJ SOLN
1200.0000 [IU]/h | INTRAMUSCULAR | Status: DC
  Administered 2015-05-08: 1200 [IU]/h via INTRAVENOUS
  Filled 2015-05-08: qty 250

## 2015-05-08 MED ORDER — INFLUENZA VAC SPLIT QUAD 0.5 ML IM SUSY
0.5000 mL | PREFILLED_SYRINGE | INTRAMUSCULAR | Status: AC
  Administered 2015-05-09: 0.5 mL via INTRAMUSCULAR
  Filled 2015-05-08 (×2): qty 0.5

## 2015-05-08 MED ORDER — HYDROMORPHONE HCL 1 MG/ML IJ SOLN
1.0000 mg | INTRAMUSCULAR | Status: DC | PRN
  Administered 2015-05-08 – 2015-05-10 (×9): 1 mg via INTRAVENOUS
  Filled 2015-05-08 (×9): qty 1

## 2015-05-08 MED ORDER — HYDROMORPHONE HCL 1 MG/ML IJ SOLN
1.0000 mg | INTRAMUSCULAR | Status: DC | PRN
  Administered 2015-05-08: 1 mg via INTRAVENOUS
  Filled 2015-05-08: qty 1

## 2015-05-08 MED ORDER — ENOXAPARIN SODIUM 80 MG/0.8ML ~~LOC~~ SOLN
1.0000 mg/kg | Freq: Once | SUBCUTANEOUS | Status: AC
  Administered 2015-05-08: 80 mg via SUBCUTANEOUS
  Filled 2015-05-08: qty 0.8

## 2015-05-08 MED ORDER — HEPARIN BOLUS VIA INFUSION
4000.0000 [IU] | Freq: Once | INTRAVENOUS | Status: DC
  Filled 2015-05-08: qty 4000

## 2015-05-08 MED ORDER — ONDANSETRON HCL 4 MG/2ML IJ SOLN: 4.0000 mg | Freq: Three times a day (TID) | INTRAMUSCULAR | Status: AC | PRN

## 2015-05-08 MED ORDER — MUPIROCIN 2 % EX OINT
1.0000 "application " | TOPICAL_OINTMENT | Freq: Two times a day (BID) | CUTANEOUS | Status: DC
  Administered 2015-05-08 – 2015-05-12 (×8): 1 via NASAL
  Filled 2015-05-08 (×3): qty 22

## 2015-05-08 MED ORDER — LIDOCAINE 4 % EX CREA
TOPICAL_CREAM | Freq: Three times a day (TID) | CUTANEOUS | Status: DC | PRN
  Filled 2015-05-08 (×2): qty 5

## 2015-05-08 MED ORDER — MIRTAZAPINE 15 MG PO TABS
30.0000 mg | ORAL_TABLET | Freq: Every day | ORAL | Status: DC
  Administered 2015-05-08 – 2015-05-11 (×4): 30 mg via ORAL
  Filled 2015-05-08 (×4): qty 2

## 2015-05-08 NOTE — ED Notes (Signed)
Pt to CT

## 2015-05-08 NOTE — Progress Notes (Addendum)
ANTICOAGULATION CONSULT NOTE - Initial Consult  Pharmacy Consult for heparin Indication: new pulmonary embolus and DVT  Allergies  Allergen Reactions  . Butalbital-Apap-Caffeine Hives  . Naproxen Hives  . Tylenol [Acetaminophen] Other (See Comments)    Upset stomach    Patient Measurements: Weight: 180 lb (81.647 kg), height 63 inches Heparin Dosing Weight: 70 kg  Vital Signs: Temp: 98.6 F (37 C) (08/25 1056) Temp Source: Oral (08/25 1056) BP: 98/61 mmHg (08/25 1630) Pulse Rate: 95 (08/25 1630)  Labs:  Recent Labs  05/08/15 1136  HGB 13.1  HCT 38.9  PLT 189  LABPROT 17.2*  INR 1.39  CREATININE 0.71    Estimated Creatinine Clearance: 77.6 mL/min (by C-G formula based on Cr of 0.71).   Medical History: Past Medical History  Diagnosis Date  . Tobacco use disorder   . Unspecified urinary incontinence   . Complete rupture of rotator cuff   . Acute venous embolism and thrombosis of unspecified deep vessels of lower extremity   . Calculus of kidney   . Degeneration of intervertebral disc, site unspecified   . Painful respiration   . Other and unspecified noninfectious gastroenteritis and colitis(558.9)   . HTN (hypertension)   . Family history of ischemic heart disease   . Anxiety state, unspecified   . HLD (hyperlipidemia)   . Primary hypercoagulable state   . Arthritis   . COPD (chronic obstructive pulmonary disease)   . Depression   . History of gallstones   . Protein S deficiency   . Colitis   . Renal failure   . Blood dyscrasia     protein def.  . Venous stasis ulcers     left leg  . DVT (deep venous thrombosis)     Medications:  See med rec - on warf PTA.  home dose: 5mg  daily except 7.5mg  on MWSat  Assessment: Patient's a 59 y.o with hx DVT on warfarin PTA .  She reported of being out of warfarin for about 1 week.  INR subtherapeutic at 1.39 on admission.  She presented to the ED on 8/25 with c/o RLE swelling and redness.  LE doppler now  back with acute DVT in the posterior tibial vein and proximal popliteal vein.  Chest CT angio is also positive for acute bilateral lower lobes an right middle lobe PE with evidence of right heart strain.  To start heparin for VTE treatment. Of note, patient received lovenox 80mg  SQ in the ED at 1PM today   Goal of Therapy:  Heparin level 0.3-0.7 units/ml Monitor platelets by anticoagulation protocol: Yes   Plan: - start heparin drip at 1200 units/hr at 9PM (after completion of ~75% of lovenox dosing interval) - check 8 hour heparin  - monitor for s/s bleeding  Gianni Fuchs P 05/08/2015,4:40 PM

## 2015-05-08 NOTE — ED Notes (Signed)
Initial Contact - pt A+Ox4, reports c/o RLE redness/swelling "feels like the last time I had clots".  Pt reports on coumadin, but out of it x1 week.  Pt c/o pain to RLE, 8/10.  Denies cp/palpitations or SOB.  Speaking full/clear sentences, rr even/un-lab.  MAEI.  Skin otherwise PWD.  Changed to hospital gown, placed to cardiac/02 monitor.  NAD.

## 2015-05-08 NOTE — H&P (Signed)
History and Physical    MARCEA ROJEK VZS:[id number] DOB: April 03, 1956 DOA: 05/08/2015  Referring physician: Dr. Venora Maples PCP: Scarlette Calico, MD  Specialists: none  Chief Complaint: leg swelling  HPI: Brooke Garcia is a 59 y.o. female has a past medical history significant for protein C and S deficiency, history of non compliance with multiple thrombosis events, HTN, HLD, presents to the ED with right leg swelling over the last couple of days as well as increased pain in her right leg . Patient is on chronic Coumadin, she has been followed by oncology clinic (originally saw Dr. Humphrey Rolls, than later saw Dr. Lindi Adie),  and she is reportedly noncompliant in the past with Coumadin clinic visits. She also has a history of left lower extremity venous ulcers currently followed in wound clinic. She tells me today that she had ran out of her Coumadin about 7 days ago and hasn't taken anything since. Per patient, she tried calling the PCPs office, she is not clear at what happen however she just had a text today from her pharmacy stating that her Coumadin is ready to pick up. She also endorses 1 episode of chest pain last night. She denies chest pain chest pain currently, she denies any shortness of breath, she denies any abdominal pain, nausea, vomiting or diarrhea. She denies any fever or chills. She continues to smoke. She was planning to take a plane trip to [place] tomorrow morning.  In the emergency room, patient has stable vital signs, she is not tachycardic, she is on room air and her blood pressure is within normal limits. Patient underwent venous Doppler which was positive for a DVT in the right leg, she also underwent a CT angiogram of the chest which showed acute pulmonary emboli in bilateral lower lobes and right middle lobe with evidence of right heart strain. She was started on heparin drip and TRH was asked for admission.   Review of Systems:  as per history of present illness, otherwise 10 point review  of systems negative   Past Medical History  Diagnosis Date  . Tobacco use disorder   . Unspecified urinary incontinence   . Complete rupture of rotator cuff   . Acute venous embolism and thrombosis of unspecified deep vessels of lower extremity   . Calculus of kidney   . Degeneration of intervertebral disc, site unspecified   . Painful respiration   . Other and unspecified noninfectious gastroenteritis and colitis(558.9)   . HTN (hypertension)   . Family history of ischemic heart disease   . Anxiety state, unspecified   . HLD (hyperlipidemia)   . Primary hypercoagulable state   . Arthritis   . COPD (chronic obstructive pulmonary disease)   . Depression   . History of gallstones   . Protein S deficiency   . Colitis   . Renal failure   . Blood dyscrasia     protein def.  . Venous stasis ulcers     left leg  . DVT (deep venous thrombosis)    Past Surgical History  Procedure Laterality Date  . Thrombectomy / embolectomy femoral artery      DVT  . Nasal sinus surgery    . Tubal ligation    . Rotator cuff repair      right  . Tubal ligation reversal    . Colonoscopy  04/27/2012    Procedure: COLONOSCOPY;  Surgeon: Jerene Bears, MD;  Location: [facility];  Service: Gastroenterology;  Laterality: N/A;  . Endovenous ablation  saphenous vein w/ laser Left 07-04-2014    EVLA left greater saphenous vein by Curt Jews MD  . Endovenous ablation saphenous vein w/ laser Right 07-17-2014    EVLA roght greater saphenous vein by Curt Jews MD   Social History:  reports that she has been smoking Cigarettes.  She has a 15 pack-year smoking history. She has never used smokeless tobacco. She reports that she does not drink alcohol or use illicit drugs.  Allergies  Allergen Reactions  . Butalbital-Apap-Caffeine Hives  . Naproxen Hives  . Tylenol [Acetaminophen] Other (See Comments)    Upset stomach    Family History  Problem Relation Age of Onset  . Breast cancer Mother     mets to  liver  . Stroke Mother   . Heart attack Father   . Lung cancer Father   . Other Father     Protein S Deficiency  . Clotting disorder Father   . Diabetes Father   . Heart disease Father   . Cancer Father     Lung  . Hypertension Maternal Grandmother   . Colon cancer Maternal Grandmother   . Heart disease Maternal Grandmother   . Heart attack Paternal Grandmother   . Diabetes Sister   . Stomach cancer Neg Hx   . Alzheimer's disease Maternal Grandfather   . Tuberculosis Paternal Grandmother   . Hypertension Brother   . Ulcers Brother     Prior to Admission medications   Medication Sig Start Date End Date Taking? Authorizing Provider  ALPRAZolam Duanne Moron) 1 MG tablet Take 1 tablet (1 mg total) by mouth 3 (three) times daily as needed for anxiety. 01/09/15  Yes Janith Lima, MD  amLODipine (NORVASC) 10 MG tablet TAKE 1 TABLET BY MOUTH ONCE DAILY Patient taking differently: TAKE 10 MG MOUTH ONCE DAILY 01/14/15  Yes Janith Lima, MD  celecoxib (CELEBREX) 200 MG capsule TAKE ONE CAPSULE BY MOUTH TWICE DAILY Patient taking differently: TAKE 200 MG BY MOUTH ONCE DAILY 01/04/15  Yes Janith Lima, MD  fluticasone-salmeterol (ADVAIR HFA) 230-21 MCG/ACT inhaler Inhale 1 puff into the lungs at bedtime. Patient taking differently: Inhale 1 puff into the lungs daily as needed (shortness of breath, wheezing).  03/20/14  Yes Janith Lima, MD  mirtazapine (REMERON) 30 MG tablet TAKE 1 TABLET BY MOUTH EVERY NIGHT AT BEDTIME Patient taking differently: TAKE 30  MG BY MOUTH EVERY NIGHT AT BEDTIME 01/04/15  Yes Janith Lima, MD  pantoprazole (PROTONIX) 40 MG tablet TAKE 1 TABLET BY MOUTH DAILY Patient taking differently: TAKE 40 MG BY MOUTH DAILY 01/04/15  Yes Janith Lima, MD  warfarin (COUMADIN) 5 MG tablet TAKE 1 TO 1& 1/2 TABLETS BY MOUTH DAILY- TITRATE BASED ON INR& MD RECOMMENDATION Patient taking differently: TAKE 7.5 MG BY MOUTH ON MWSAT, AND 5 MG ON ALL OTHER DAYS. 02/27/15  Yes Janith Lima, MD  diphenoxylate-atropine (LOMOTIL) 2.5-0.025 MG per tablet Take 1 tablet by mouth 4 (four) times daily as needed for diarrhea or loose stools. Patient not taking: Reported on 05/08/2015 05/31/14   Jerene Bears, MD  gabapentin (NEURONTIN) 600 MG tablet TAKE 1 TABLET BY MOUTH THREE TIMES DAILY Patient not taking: Reported on 05/08/2015 01/04/15   Janith Lima, MD  terbinafine (LAMISIL) 250 MG tablet Take 1 tablet (250 mg total) by mouth daily. Patient not taking: Reported on 05/08/2015 01/09/15   Janith Lima, MD  tiotropium (SPIRIVA HANDIHALER) 18 MCG inhalation capsule Place 1 capsule (18  mcg total) into inhaler and inhale daily. Patient not taking: Reported on 05/08/2015 03/20/14   Janith Lima, MD   Physical Exam: Filed Vitals:   05/08/15 1207 05/08/15 1302 05/08/15 1630 05/08/15 1700  BP:  138/76 98/61 121/83  Pulse:  84 95 89  Temp:      TempSrc:      Resp:  16  24  Weight: 81.647 kg (180 lb)     SpO2:  100% 100% 98%     GENERAL: NAD, pleasant Caucasian female  HEENT: head NCAT, no scleral icterus. Pupils round and reactive. Mucous membranes are moist.   NECK: Supple.  LUNGS: Clear to auscultation. No wheezing or crackles  HEART: Regular rate and rhythm without murmur. 2+ pulses, no JVD  ABDOMEN: Soft, nontender, and nondistended. Positive bowel sounds.  EXTREMITIES: Left lower extremity wrapped. Right lower extremity with swelling from the ankle area to thigh   NEUROLOGIC: Alert and oriented x3. Cranial nerves II through XII are grossly intact. Strength 5/5 in all 4.  PSYCHIATRIC: Normal mood and affect  SKIN: No ulceration or induration present.   Labs on Admission:  Basic Metabolic Panel:  Recent Labs Lab 05/08/15 1136  NA 134*  K 3.2*  CL 101  CO2 25  GLUCOSE 121*  BUN 5*  CREATININE 0.71  CALCIUM 8.4*   CBC:  Recent Labs Lab 05/08/15 1136  WBC 10.2  HGB 13.1  HCT 38.9  MCV 100.0  PLT 189   Radiological Exams on Admission: Ct Angio  Chest Pe W/cm &/or Wo Cm  05/08/2015   CLINICAL DATA:  Right lower extremity redness and swelling. Patient has a history of DVT MP.  EXAM: CT ANGIOGRAPHY CHEST WITH CONTRAST  TECHNIQUE: Multidetector CT imaging of the chest was performed using the standard protocol during bolus administration of intravenous contrast. Multiplanar CT image reconstructions and MIPs were obtained to evaluate the vascular anatomy.  CONTRAST:  137mL OMNIPAQUE IOHEXOL 350 MG/ML SOLN  COMPARISON:  December 06, 2007  FINDINGS: There is pulmonary embolus in the segmental pulmonary arteries of the left lower lobe, right middle lobe, right lower lobe and subsegmental pulmonary arteries of the right lower lobe. The right ventricular to left ventricular ratio is 1.05 suggesting right heart strain. There is no pulmonary infarct. Minimal atelectasis of the posterior lung bases are identified. There is no pleural effusion. There is no mediastinal or hilar lymphadenopathy. The heart size is normal. The visualized upper abdominal structures are normal. Degenerative joint changes of the spine are identified.  Review of the MIP images confirms the above findings.  IMPRESSION: Acute pulmonary embolus in bilateral lower lobes, right middle lobe. There is evidence of right heart strain. There is no evidence of pulmonary infarct.  These results will be called to the ordering clinician or representative by the Radiologist Assistant, and communication documented in the PACS or zVision Dashboard.   Electronically Signed   By: Abelardo Diesel M.D.   On: 05/08/2015 15:09    EKG: Independently reviewed. Sinus rhythm   Assessment/Plan Principal Problem:   Pulmonary emboli Active Problems:   HYPERCOAGULABLE STATE, PRIMARY   TOBACCO ABUSE   HYPERTENSION, BENIGN ESSENTIAL   DVT of lower extremity, bilateral   Anticoagulant long-term use   Hyperlipidemia with target LDL less than 130   Acute pulmonary embolus   Acute pulmonary emboli in the setting of  known hypercoagulable state, lower extremity DVT - In the setting of noncompliance with Coumadin, her INR on presentation is 1.39 - Start  anticoagulation with heparin  - Given right heart strain, obtain a 2-D echo  - She has a history of noncompliance with Coumadin as well as follow-up in Coumadin clinic as per oncology notes. I touched base with Dr. Lindi Adie with oncology, in her case he would be okay to use Xarelto as to hopefully this may increase compliance. I've discussed this with the patient, she is in agreement, if she is stable on a heparin drip and after the 2-D echo results, we'll consult case management to assist with Xarelto.  - We'll closely monitor in step down, currently her vital signs are stable and she is on room air  Hypertension - For now hold her home antihypertensives  Tobacco abuse - Strongly encouraged for cessation, she has tried Chantix in the past however had side effects and self discontinued it  Chronic left lower extremity ulcers - Outpatient follow-up in wound clinic  - We'll consult wound care to evaluate his    Diet: regular Fluids: none DVT Prophylaxis: heparin gtt  Code Status: Full  Family Communication: no family bedside  Disposition Plan: admit to [place]. Cruzita Lederer, MD [facility] Pager [phone]  If 7PM-7AM, please contact night-coverage [email or web address] Password [facility] 05/08/2015, 5:08 PM

## 2015-05-08 NOTE — ED Notes (Signed)
Pt c/o redness, warmth, and swelling to R foot, R knee, and R groin x 1 day.  Pain score 8/10.  Hx of DVT.  Pt reports that she has been out of her Warfarin x 1 week, due to "[facility] screwing up."

## 2015-05-08 NOTE — ED Provider Notes (Signed)
CSN: [id number]     Arrival date & time 05/08/15  1048 History   First MD Initiated Contact with Patient 05/08/15 1053     Chief Complaint  Patient presents with  . Leg Swelling     (Consider location/radiation/quality/duration/timing/severity/associated sxs/prior Treatment) The history is provided by the patient and medical records. No language interpreter was used.     Brooke Garcia is a 59 y.o. female  with a hx of protein S and C deficiency, DVT (recurrent on warfarin) presents to the Emergency Department complaining of gradual, persistent, progressively worsening swelling and pain to the right lower leg onset yesterday morning. Associated symptoms include erythema, swelling and pain.  Nothing makes it better and nothing makes it worse.  Pt denies fever, headache, neck pain, abd pain, N/V/D, weakness, dizziness.   Pt reports she did have some chest pain last night, but is now CP free. She reports she is a smoker and is always short of breath.  Denies palpitations or hemoptysis.     Past Medical History  Diagnosis Date  . Tobacco use disorder   . Unspecified urinary incontinence   . Complete rupture of rotator cuff   . Acute venous embolism and thrombosis of unspecified deep vessels of lower extremity   . Calculus of kidney   . Degeneration of intervertebral disc, site unspecified   . Painful respiration   . Other and unspecified noninfectious gastroenteritis and colitis(558.9)   . HTN (hypertension)   . Family history of ischemic heart disease   . Anxiety state, unspecified   . HLD (hyperlipidemia)   . Primary hypercoagulable state   . Arthritis   . COPD (chronic obstructive pulmonary disease)   . Depression   . History of gallstones   . Protein S deficiency   . Colitis   . Renal failure   . Blood dyscrasia     protein def.  . Venous stasis ulcers     left leg  . DVT (deep venous thrombosis)    Past Surgical History  Procedure Laterality Date  . Thrombectomy /  embolectomy femoral artery      DVT  . Nasal sinus surgery    . Tubal ligation    . Rotator cuff repair      right  . Tubal ligation reversal    . Colonoscopy  04/27/2012    Procedure: COLONOSCOPY;  Surgeon: Jerene Bears, MD;  Location: [facility];  Service: Gastroenterology;  Laterality: N/A;  . Endovenous ablation saphenous vein w/ laser Left 07-04-2014    EVLA left greater saphenous vein by Curt Jews MD  . Endovenous ablation saphenous vein w/ laser Right 07-17-2014    EVLA roght greater saphenous vein by Curt Jews MD   Family History  Problem Relation Age of Onset  . Breast cancer Mother     mets to liver  . Stroke Mother   . Heart attack Father   . Lung cancer Father   . Other Father     Protein S Deficiency  . Clotting disorder Father   . Diabetes Father   . Heart disease Father   . Cancer Father     Lung  . Hypertension Maternal Grandmother   . Colon cancer Maternal Grandmother   . Heart disease Maternal Grandmother   . Heart attack Paternal Grandmother   . Diabetes Sister   . Stomach cancer Neg Hx   . Alzheimer's disease Maternal Grandfather   . Tuberculosis Paternal Grandmother   . Hypertension Brother   .  Ulcers Brother    Social History  Substance Use Topics  . Smoking status: Current Every Day Smoker -- 0.50 packs/day for 30 years    Types: Cigarettes  . Smokeless tobacco: Never Used     Comment: 0.5 packs per day  . Alcohol Use: No   OB History    No data available     Review of Systems  Constitutional: Negative for fever, diaphoresis, appetite change, fatigue and unexpected weight change.  HENT: Negative for mouth sores.   Eyes: Negative for visual disturbance.  Respiratory: Positive for cough and shortness of breath (chronic). Negative for chest tightness and wheezing.   Cardiovascular: Positive for chest pain and leg swelling (right).  Gastrointestinal: Negative for nausea, vomiting, abdominal pain, diarrhea and constipation.  Endocrine:  Negative for polydipsia, polyphagia and polyuria.  Genitourinary: Negative for dysuria, urgency, frequency and hematuria.  Musculoskeletal: Negative for back pain and neck stiffness.  Skin: Negative for rash.  Allergic/Immunologic: Negative for immunocompromised state.  Neurological: Negative for syncope, light-headedness and headaches.  Hematological: Does not bruise/bleed easily.  Psychiatric/Behavioral: Negative for sleep disturbance. The patient is not nervous/anxious.       Allergies  Butalbital-apap-caffeine; Naproxen; and Tylenol  Home Medications   Prior to Admission medications   Medication Sig Start Date End Date Taking? Authorizing Provider  ALPRAZolam Duanne Moron) 1 MG tablet Take 1 tablet (1 mg total) by mouth 3 (three) times daily as needed for anxiety. 01/09/15  Yes Janith Lima, MD  amLODipine (NORVASC) 10 MG tablet TAKE 1 TABLET BY MOUTH ONCE DAILY Patient taking differently: TAKE 10 MG MOUTH ONCE DAILY 01/14/15  Yes Janith Lima, MD  celecoxib (CELEBREX) 200 MG capsule TAKE ONE CAPSULE BY MOUTH TWICE DAILY Patient taking differently: TAKE 200 MG BY MOUTH ONCE DAILY 01/04/15  Yes Janith Lima, MD  fluticasone-salmeterol (ADVAIR HFA) 230-21 MCG/ACT inhaler Inhale 1 puff into the lungs at bedtime. Patient taking differently: Inhale 1 puff into the lungs daily as needed (shortness of breath, wheezing).  03/20/14  Yes Janith Lima, MD  mirtazapine (REMERON) 30 MG tablet TAKE 1 TABLET BY MOUTH EVERY NIGHT AT BEDTIME Patient taking differently: TAKE 30  MG BY MOUTH EVERY NIGHT AT BEDTIME 01/04/15  Yes Janith Lima, MD  pantoprazole (PROTONIX) 40 MG tablet TAKE 1 TABLET BY MOUTH DAILY Patient taking differently: TAKE 40 MG BY MOUTH DAILY 01/04/15  Yes Janith Lima, MD  warfarin (COUMADIN) 5 MG tablet TAKE 1 TO 1& 1/2 TABLETS BY MOUTH DAILY- TITRATE BASED ON INR& MD RECOMMENDATION Patient taking differently: TAKE 7.5 MG BY MOUTH ON MWSAT, AND 5 MG ON ALL OTHER DAYS. 02/27/15   Yes Janith Lima, MD  diphenoxylate-atropine (LOMOTIL) 2.5-0.025 MG per tablet Take 1 tablet by mouth 4 (four) times daily as needed for diarrhea or loose stools. Patient not taking: Reported on 05/08/2015 05/31/14   Jerene Bears, MD  gabapentin (NEURONTIN) 600 MG tablet TAKE 1 TABLET BY MOUTH THREE TIMES DAILY Patient not taking: Reported on 05/08/2015 01/04/15   Janith Lima, MD  terbinafine (LAMISIL) 250 MG tablet Take 1 tablet (250 mg total) by mouth daily. Patient not taking: Reported on 05/08/2015 01/09/15   Janith Lima, MD  tiotropium (SPIRIVA HANDIHALER) 18 MCG inhalation capsule Place 1 capsule (18 mcg total) into inhaler and inhale daily. Patient not taking: Reported on 05/08/2015 03/20/14   Janith Lima, MD   BP 138/76 mmHg  Pulse 84  Temp(Src) 98.6 F (37 C) (Oral)  Resp 16  Wt 180 lb (81.647 kg)  SpO2 100% Physical Exam  Constitutional: She appears well-developed and well-nourished. No distress.  Awake, alert, nontoxic appearance  HENT:  Head: Normocephalic and atraumatic.  Mouth/Throat: Oropharynx is clear and moist. No oropharyngeal exudate.  Eyes: Conjunctivae are normal. No scleral icterus.  Neck: Normal range of motion. Neck supple.  Cardiovascular: Regular rhythm, normal heart sounds and intact distal pulses.  Tachycardia present.   Pulses:      Radial pulses are 2+ on the right side, and 2+ on the left side.       Dorsalis pedis pulses are 2+ on the right side.  Capillary refill < 3 sec BLE warm to touch  Pulmonary/Chest: Effort normal and breath sounds normal. No respiratory distress. She has no wheezes.  Equal chest expansion Coarse breath sounds  Abdominal: Soft. Bowel sounds are normal. She exhibits no mass. There is no tenderness. There is no rebound and no guarding.  Musculoskeletal: Normal range of motion. She exhibits edema.  Edema of the right lower leg with TTP of the right calf, popliteal space and medial thigh.   Erythema and increased warmth of  the right groin, medial thigh and calf without induration of fluctuance.    Neurological: She is alert.  Speech is clear and goal oriented Moves extremities without ataxia  Skin: Skin is warm and dry. She is not diaphoretic.  Psychiatric: She has a normal mood and affect.  Nursing note and vitals reviewed.   ED Course  Procedures (including critical care time) Labs Review Labs Reviewed  PROTIME-INR - Abnormal; Notable for the following:    Prothrombin Time 17.2 (*)    All other components within normal limits  BASIC METABOLIC PANEL - Abnormal; Notable for the following:    Sodium 134 (*)    Potassium 3.2 (*)    Glucose, Bld 121 (*)    BUN 5 (*)    Calcium 8.4 (*)    All other components within normal limits  CBC  I-STAT TROPOININ, ED    Imaging Review Ct Angio Chest Pe W/cm &/or Wo Cm  05/08/2015   CLINICAL DATA:  Right lower extremity redness and swelling. Patient has a history of DVT MP.  EXAM: CT ANGIOGRAPHY CHEST WITH CONTRAST  TECHNIQUE: Multidetector CT imaging of the chest was performed using the standard protocol during bolus administration of intravenous contrast. Multiplanar CT image reconstructions and MIPs were obtained to evaluate the vascular anatomy.  CONTRAST:  143mL OMNIPAQUE IOHEXOL 350 MG/ML SOLN  COMPARISON:  December 06, 2007  FINDINGS: There is pulmonary embolus in the segmental pulmonary arteries of the left lower lobe, right middle lobe, right lower lobe and subsegmental pulmonary arteries of the right lower lobe. The right ventricular to left ventricular ratio is 1.05 suggesting right heart strain. There is no pulmonary infarct. Minimal atelectasis of the posterior lung bases are identified. There is no pleural effusion. There is no mediastinal or hilar lymphadenopathy. The heart size is normal. The visualized upper abdominal structures are normal. Degenerative joint changes of the spine are identified.  Review of the MIP images confirms the above findings.   IMPRESSION: Acute pulmonary embolus in bilateral lower lobes, right middle lobe. There is evidence of right heart strain. There is no evidence of pulmonary infarct.  These results will be called to the ordering clinician or representative by the Radiologist Assistant, and communication documented in the PACS or zVision Dashboard.   Electronically Signed   By: Mallie Darting.D.  On: 05/08/2015 15:09   I have personally reviewed and evaluated these images and lab results as part of my medical decision-making.   EKG Interpretation   Date/Time:  Thursday May 08 2015 11:42:09 EDT Ventricular Rate:  84 PR Interval:  150 QRS Duration: 87 QT Interval:  371 QTC Calculation: 438 R Axis:   53 Text Interpretation:  Sinus rhythm Multiple ventricular premature  complexes No significant change was found Confirmed by CAMPOS  MD, Lennette Bihari  ([id number]) on 05/08/2015 12:05:56 PM      CRITICAL CARE Performed by: Abigail Butts Total critical care time: 22min Critical care time was exclusive of separately billable procedures and treating other patients. Critical care was necessary to treat or prevent imminent or life-threatening deterioration. Critical care was time spent personally by me on the following activities: development of treatment plan with patient and/or surrogate as well as nursing, discussions with consultants, evaluation of patient's response to treatment, examination of patient, obtaining history from patient or surrogate, ordering and performing treatments and interventions, ordering and review of laboratory studies, ordering and review of radiographic studies, pulse oximetry and re-evaluation of patient's condition.   MDM   Final diagnoses:  DVT of leg (deep venous thrombosis), right  Pulmonary emboli  Venous stasis ulcer, left  TOBACCO ABUSE  HYPERCOAGULABLE STATE, PRIMARY   Breane L Mincer presents with right leg swelling and pain. Patient has been off her Coumadin 7 days. She  has a clotting disorder and history of multiple DVTs. She reports erythema of the right lower and upper leg consistent with previous DVT formations.  Patient with chest pain last night, resolved this morning. We'll begin workup including CT angiogram chest to r/o PE.    4:05 PM CT angiogram with multiple pulmonary emboli. Patient has been given Lovenox here. Will need admission as there is concern for right heart strain.  Will give increased pain control for her leg and begin heparin.  4:32 PM Discussed with Dr. Cruzita Lederer.  Will admit to Step-down.  Vitals are stable.      The patient was discussed with and seen by Dr. Venora Maples who agrees with the treatment plan.  BP 138/76 mmHg  Pulse 84  Temp(Src) 98.6 F (37 C) (Oral)  Resp 16  Wt 180 lb (81.647 kg)  SpO2 100%   Abigail Butts, PA-C 05/08/15 [place], MD 05/08/15 1655

## 2015-05-08 NOTE — ED Notes (Signed)
US at bedside

## 2015-05-08 NOTE — ED Notes (Signed)
CT aware pt ready.

## 2015-05-08 NOTE — ED Notes (Signed)
IV angio to R FA infiltrated while in CT with dye.  Large swelling noted.  IV d/c'd.  Pt also asking for pain medication for LE pain.  EDPA aware.

## 2015-05-08 NOTE — Progress Notes (Signed)
Pt tested positive for MRSA; Pt placed on contact precautions and standing MRSA orders were put in; Lamar Blinks, NP was notified;

## 2015-05-08 NOTE — ED Notes (Signed)
U/S complete, pt ambulatory with slow steady gait to void in BR.  Pt denies further needs/complaints at this time.  NAD.

## 2015-05-08 NOTE — Progress Notes (Signed)
VASCULAR LAB PRELIMINARY  PRELIMINARY  PRELIMINARY  PRELIMINARY  Right lower extremity venous duplex completed.    Preliminary report:  Positive for acute DVT noted in the posterior tibial vein and proximal popliteal vein. Chronic DVT noted in the popliteal vein. Chronic superficial thrombus noted in the greater saphenous and the lesser saphenous vein No evidence of propagation to the left.  No evidence of a Baker's cyst.  Frederica Chrestman, RVS 05/08/2015, 12:53 PM

## 2015-05-09 ENCOUNTER — Inpatient Hospital Stay (HOSPITAL_COMMUNITY): Payer: Medicare Other

## 2015-05-09 DIAGNOSIS — I2699 Other pulmonary embolism without acute cor pulmonale: Secondary | ICD-10-CM

## 2015-05-09 DIAGNOSIS — I10 Essential (primary) hypertension: Secondary | ICD-10-CM

## 2015-05-09 LAB — CBC
HCT: 36 % (ref 36.0–46.0)
Hemoglobin: 12.4 g/dL (ref 12.0–15.0)
MCH: 34.5 pg — ABNORMAL HIGH (ref 26.0–34.0)
MCHC: 34.4 g/dL (ref 30.0–36.0)
MCV: 100.3 fL — AB (ref 78.0–100.0)
PLATELETS: 194 10*3/uL (ref 150–400)
RBC: 3.59 MIL/uL — ABNORMAL LOW (ref 3.87–5.11)
RDW: 14.4 % (ref 11.5–15.5)
WBC: 9.5 10*3/uL (ref 4.0–10.5)

## 2015-05-09 LAB — HEPARIN LEVEL (UNFRACTIONATED)
HEPARIN UNFRACTIONATED: 0.3 [IU]/mL (ref 0.30–0.70)
HEPARIN UNFRACTIONATED: 0.17 [IU]/mL — AB (ref 0.30–0.70)

## 2015-05-09 MED ORDER — RIVAROXABAN 15 MG PO TABS
15.0000 mg | ORAL_TABLET | Freq: Two times a day (BID) | ORAL | Status: DC
  Administered 2015-05-09 – 2015-05-12 (×6): 15 mg via ORAL
  Filled 2015-05-09 (×6): qty 1

## 2015-05-09 MED ORDER — HEPARIN (PORCINE) IN NACL 100-0.45 UNIT/ML-% IJ SOLN
1500.0000 [IU]/h | INTRAMUSCULAR | Status: DC
  Administered 2015-05-09: 1500 [IU]/h via INTRAVENOUS
  Filled 2015-05-09 (×2): qty 250

## 2015-05-09 MED ORDER — OXYCODONE HCL 5 MG PO TABS
5.0000 mg | ORAL_TABLET | ORAL | Status: DC | PRN
  Administered 2015-05-09 – 2015-05-10 (×8): 10 mg via ORAL
  Filled 2015-05-09 (×8): qty 2

## 2015-05-09 MED ORDER — NICOTINE 21 MG/24HR TD PT24
21.0000 mg | MEDICATED_PATCH | TRANSDERMAL | Status: DC
  Administered 2015-05-09 – 2015-05-11 (×3): 21 mg via TRANSDERMAL
  Filled 2015-05-09 (×3): qty 1

## 2015-05-09 MED ORDER — HEPARIN BOLUS VIA INFUSION
2000.0000 [IU] | Freq: Once | INTRAVENOUS | Status: AC
  Administered 2015-05-09: 2000 [IU] via INTRAVENOUS
  Filled 2015-05-09: qty 2000

## 2015-05-09 MED ORDER — CALCIUM CARBONATE ANTACID 500 MG PO CHEW: 1.0000 | CHEWABLE_TABLET | Freq: Four times a day (QID) | ORAL | Status: DC | PRN

## 2015-05-09 NOTE — Progress Notes (Signed)
Echocardiogram 2D Echocardiogram has been performed.  Tresa Res 05/09/2015, 3:55 PM

## 2015-05-09 NOTE — Discharge Instructions (Addendum)
Information on my medicine - XARELTO (rivaroxaban)  This medication education was reviewed with me or my healthcare representative as part of my discharge preparation.  The pharmacist that spoke with me during my hospital stay was:  Biagio Borg, [place]? Xarelto was prescribed to treat blood clots that may have been found in the veins of your legs (deep vein thrombosis) or in your lungs (pulmonary embolism) and to reduce the risk of them occurring again.  What do you need to know about Xarelto? The starting dose is one 15 mg tablet taken TWICE daily with food for the FIRST 21 DAYS then on 05/30/15  the dose is changed to one 20 mg tablet taken ONCE A DAY with your evening meal.  (Note, since you started Xarelto with an evening dose, you will have one 15 mg tablet left over when you transition to the 20 mg daily tablet on 05/30/15).  DO NOT stop taking Xarelto without talking to the health care provider who prescribed the medication.  Refill your prescription for 20 mg tablets before you run out.  After discharge, you should have regular check-up appointments with your healthcare provider that is prescribing your Xarelto.  In the future your dose may need to be changed if your kidney function changes by a significant amount.  What do you do if you miss a dose? If you are taking Xarelto TWICE DAILY and you miss a dose, take it as soon as you remember. You may take two 15 mg tablets (total 30 mg) at the same time then resume your regularly scheduled 15 mg twice daily the next day.  If you are taking Xarelto ONCE DAILY and you miss a dose, take it as soon as you remember on the same day then continue your regularly scheduled once daily regimen the next day. Do not take two doses of Xarelto at the same time.   Important Safety Information Xarelto is a blood thinner medicine that can cause bleeding. You should call your healthcare provider right  away if you experience any of the following: ? Bleeding from an injury or your nose that does not stop. ? Unusual colored urine (red or dark brown) or unusual colored stools (red or black). ? Unusual bruising for unknown reasons. ? A serious fall or if you hit your head (even if there is no bleeding).  Some medicines may interact with Xarelto and might increase your risk of bleeding while on Xarelto. To help avoid this, consult your healthcare provider or pharmacist prior to using any new prescription or non-prescription medications, including herbals, vitamins, non-steroidal anti-inflammatory drugs (NSAIDs) and supplements.  This website has more information on Xarelto: [email or web address]   Follow with Scarlette Calico, MD in 5-7 days  Please get a complete blood count and chemistry panel checked by your Primary MD at your next visit, and again as instructed by your Primary MD. Please get your medications reviewed and adjusted by your Primary MD.  Please request your Primary MD to go over all Hospital Tests and Procedure/Radiological results at the follow up, please get all Hospital records sent to your Prim MD by signing hospital release before you go home.  If you had Pneumonia of Lung problems at the Hospital: Please get a 2 view Chest X ray done in 6-8 weeks after hospital discharge or sooner if instructed by your Primary MD.  If you have Congestive Heart Failure: Please call your Cardiologist or Primary MD anytime you  have any of the following symptoms:  1) 3 pound weight gain in 24 hours or 5 pounds in 1 week  2) shortness of breath, with or without a dry hacking cough  3) swelling in the hands, feet or stomach  4) if you have to sleep on extra pillows at night in order to breathe  Follow cardiac low salt diet and 1.5 lit/day fluid restriction.  If you have diabetes Accuchecks 4 times/day, Once in AM empty stomach and then before each meal. Log in all results and show them to your  primary doctor at your next visit. If any glucose reading is under 80 or above 300 call your primary MD immediately.  If you have Seizure/Convulsions/Epilepsy: Please do not drive, operate heavy machinery, participate in activities at heights or participate in high speed sports until you have seen by Primary MD or a Neurologist and advised to do so again.  If you had Gastrointestinal Bleeding: Please ask your Primary MD to check a complete blood count within one week of discharge or at your next visit. Your endoscopic/colonoscopic biopsies that are pending at the time of discharge, will also need to followed by your Primary MD.  Get Medicines reviewed and adjusted. Please take all your medications with you for your next visit with your Primary MD  Please request your Primary MD to go over all hospital tests and procedure/radiological results at the follow up, please ask your Primary MD to get all Hospital records sent to his/her office.  If you experience worsening of your admission symptoms, develop shortness of breath, life threatening emergency, suicidal or homicidal thoughts you must seek medical attention immediately by calling 911 or calling your MD immediately  if symptoms less severe.  You must read complete instructions/literature along with all the possible adverse reactions/side effects for all the Medicines you take and that have been prescribed to you. Take any new Medicines after you have completely understood and accpet all the possible adverse reactions/side effects.   Do not drive or operate heavy machinery when taking Pain medications.   Do not take more than prescribed Pain, Sleep and Anxiety Medications  Special Instructions: If you have smoked or chewed Tobacco  in the last 2 yrs please stop smoking, stop any regular Alcohol  and or any Recreational drug use.  Wear Seat belts while driving.  Please note You were cared for by a hospitalist during your hospital stay. If  you have any questions about your discharge medications or the care you received while you were in the hospital after you are discharged, you can call the unit and asked to speak with the hospitalist on call if the hospitalist that took care of you is not available. Once you are discharged, your primary care physician will handle any further medical issues. Please note that NO REFILLS for any discharge medications will be authorized once you are discharged, as it is imperative that you return to your primary care physician (or establish a relationship with a primary care physician if you do not have one) for your aftercare needs so that they can reassess your need for medications and monitor your lab values.  You can reach the hospitalist office at phone [phone] or fax [phone]   If you do not have a primary care physician, you can call [phone] for a physician referral.  Activity: As tolerated with Full fall precautions use walker/cane & assistance as needed  Diet: regular  Disposition Home

## 2015-05-09 NOTE — Progress Notes (Signed)
Report called to April RN.  Patient transferring via wheelchair to room 1422.

## 2015-05-09 NOTE — Progress Notes (Signed)
Received from ICU.Voided upon entry.BLE w/ edema Lt=+1 RT=+2 Skin WDI X dressing to LLE ankle area

## 2015-05-09 NOTE — Care Management Note (Signed)
Case Management Note  Patient Details  Name: Brooke Garcia MRN: [id number] Date of Birth: July 20, 1956  Subjective/Objective:   Patient states she has medicare, & medicaid-her co pay is $6. What her medicare doesn't cover is picked up by medicaid. Pharmacy-[facility].She will be able to get xarelto @ pharmacy. She is not eligible for any discount savings card since she has medicare, & medicaid with script coverage.                 Action/Plan:d/c plan home.   Expected Discharge Date:   (unknown)               Expected Discharge Plan:  Home/Self Care  In-House Referral:  NA  Discharge planning Services  CM Consult  Post Acute Care Choice:  NA Choice offered to:  NA  DME Arranged:    DME Agency:     HH Arranged:    HH Agency:     Status of Service:  Completed, signed off  Medicare Important Message Given:    Date Medicare IM Given:    Medicare IM give by:    Date Additional Medicare IM Given:    Additional Medicare Important Message give by:     If discussed at [place] of Stay Meetings, dates discussed:    Additional Comments:  Dessa Phi, RN 05/09/2015, 5:21 PM

## 2015-05-09 NOTE — Progress Notes (Signed)
[place] for heparin --> Xarelto Indication: new pulmonary embolus and DVT  Allergies  Allergen Reactions  . Butalbital-Apap-Caffeine Hives  . Naproxen Hives  . Tylenol [Acetaminophen] Other (See Comments)    Upset stomach    Patient Measurements: Height: 5\' 3"  (160 cm) Weight: 179 lb 1.6 oz (81.239 kg) IBW/kg (Calculated) : 52.4, height 63 inches Heparin Dosing Weight: 70 kg  Vital Signs: Temp: 98.2 F (36.8 C) (08/26 1418) Temp Source: Oral (08/26 1418) BP: 134/63 mmHg (08/26 1418) Pulse Rate: 80 (08/26 1418)  Labs:  Recent Labs  05/08/15 1136 05/09/15 0405 05/09/15 1216  HGB 13.1 12.4  --   HCT 38.9 36.0  --   PLT 189 194  --   LABPROT 17.2*  --   --   INR 1.39  --   --   HEPARINUNFRC  --  0.30 0.17*  CREATININE 0.71  --   --     Estimated Creatinine Clearance: 77.3 mL/min (by C-G formula based on Cr of 0.71).   Medical History: Past Medical History  Diagnosis Date  . Tobacco use disorder   . Unspecified urinary incontinence   . Complete rupture of rotator cuff   . Acute venous embolism and thrombosis of unspecified deep vessels of lower extremity   . Calculus of kidney   . Degeneration of intervertebral disc, site unspecified   . Painful respiration   . Other and unspecified noninfectious gastroenteritis and colitis(558.9)   . HTN (hypertension)   . Family history of ischemic heart disease   . Anxiety state, unspecified   . HLD (hyperlipidemia)   . Primary hypercoagulable state   . Arthritis   . COPD (chronic obstructive pulmonary disease)   . Depression   . History of gallstones   . Protein S deficiency   . Colitis   . Renal failure   . Blood dyscrasia     protein def.  . Venous stasis ulcers     left leg  . DVT (deep venous thrombosis)     Medications:  See med rec - on warf PTA.  home dose: 5mg  daily except 7.5mg  on MWSat  Assessment: Patient's a 59 y.o with hx DVT on warfarin PTA .  She  reported of being out of warfarin for about 1 week.  INR subtherapeutic at 1.39 on admission.  She presented to the ED on 8/25 with c/o RLE swelling and redness.  LE doppler now back with acute DVT in the posterior tibial vein and proximal popliteal vein.  Chest CT angio is also positive for acute bilateral lower lobes an right middle lobe PE with evidence of right heart strain.  Started heparin for VTE treatment and now transitioning to Xarelto.  Hgb and platelets WNL SCr 0.71, CrCl~77 ml/min  Goal of Therapy:  VTE Treatment   Plan: - Stop heparin infusion. - Start Xarelto 15 mg BID x 21 days then transition to 20 mg daily.  May give first dose of Xarelto as soon as heparin infusion stopped. - Provided Xarelto education to patient- emphasized shorter duration of drug (compared to warfarin) and therefore importance of compliance with medication to prevent future DVT/PE  Brooke Garcia 05/09/2015,4:38 PM

## 2015-05-09 NOTE — Consult Note (Addendum)
WOC wound consult note Reason for Consult: Consult requested for left leg wounds.  Pt states these have been present for several years and she is followed as an outpatient by the wound care center.  Wounds have greatly improved,acording to the patient. Wound type: Full thickness stasis ulcer to left posterior calf; 3.5X3.8X.2cm, red and moist, no odor, mod amt yellow drainage.  There are 3 smaller full thickness stasis ulcers located nearby; affected area is 2X1cm and each wound is approx .2X.2X.2cm, red and moist, no odor.  Dressing procedure/placement/frequency: Continue present plan of care as ordered by the outpatient wound center; lidocaine mixed with Santyl to decrease discomfort with dressing changes with a foam dressing, light compression applied over the LLE with kerlex and ace wrap.  Bedside nurse can change every other day, and pt can resume follow-up after discharge with the outpatient wound care center.  She is well-informed regarding topical treatment and denies further questions. Please re-consult if further assistance is needed.  Thank-you,  Julien Girt MSN, [place], [place], [place], [place]

## 2015-05-09 NOTE — Progress Notes (Signed)
ANTICOAGULATION CONSULT NOTE   Pharmacy Consult for heparin Indication: new pulmonary embolus and DVT  Allergies  Allergen Reactions  . Butalbital-Apap-Caffeine Hives  . Naproxen Hives  . Tylenol [Acetaminophen] Other (See Comments)    Upset stomach    Patient Measurements: Height: 5\' 3"  (160 cm) Weight: 179 lb 1.6 oz (81.239 kg) IBW/kg (Calculated) : 52.4, height 63 inches Heparin Dosing Weight: 70 kg  Vital Signs: Temp: 98 F (36.7 C) (08/26 1106) Temp Source: Oral (08/26 1106) BP: 135/69 mmHg (08/26 1106) Pulse Rate: 79 (08/26 1106)  Labs:  Recent Labs  05/08/15 1136 05/09/15 0405 05/09/15 1216  HGB 13.1 12.4  --   HCT 38.9 36.0  --   PLT 189 194  --   LABPROT 17.2*  --   --   INR 1.39  --   --   HEPARINUNFRC  --  0.30 0.17*  CREATININE 0.71  --   --     Estimated Creatinine Clearance: 77.3 mL/min (by C-G formula based on Cr of 0.71).   Medical History: Past Medical History  Diagnosis Date  . Tobacco use disorder   . Unspecified urinary incontinence   . Complete rupture of rotator cuff   . Acute venous embolism and thrombosis of unspecified deep vessels of lower extremity   . Calculus of kidney   . Degeneration of intervertebral disc, site unspecified   . Painful respiration   . Other and unspecified noninfectious gastroenteritis and colitis(558.9)   . HTN (hypertension)   . Family history of ischemic heart disease   . Anxiety state, unspecified   . HLD (hyperlipidemia)   . Primary hypercoagulable state   . Arthritis   . COPD (chronic obstructive pulmonary disease)   . Depression   . History of gallstones   . Protein S deficiency   . Colitis   . Renal failure   . Blood dyscrasia     protein def.  . Venous stasis ulcers     left leg  . DVT (deep venous thrombosis)     Medications:  See med rec - on warf PTA.  home dose: 5mg  daily except 7.5mg  on MWSat  Assessment: Patient's a 59 y.o with hx DVT on warfarin PTA .  She reported of being  out of warfarin for about 1 week.  INR subtherapeutic at 1.39 on admission.  She presented to the ED on 8/25 with c/o RLE swelling and redness.  LE doppler now back with acute DVT in the posterior tibial vein and proximal popliteal vein.  Chest CT angio is also positive for acute bilateral lower lobes an right middle lobe PE with evidence of right heart strain.  To start heparin for VTE treatment. Received Lovenox 80mg  sq x1 in ED at 1pm.  05/09/2015:  Repeat heparin level now sub-therapeutic No bleeding reported; CBC stable Noted plans to transition to Xarelto for discharge  Goal of Therapy:  Heparin level 0.3-0.7 units/ml Monitor platelets by anticoagulation protocol: Yes   Plan: - Rebolus 2000 units heparin then increase heparin drip to 1500 units/hr - Recheck 6 hour heparin level - Daily heparin level & CBC while on heparin - Monitor for s/sx of bleeding - Provided Xarelto education to patient- emphasized shorter duration of drug (compared to warfarin) and therefore    importance of compliance with medication to prevent future DVT/PE  Brooke Garcia, [place] Drafts 05/09/2015,1:43 PM

## 2015-05-09 NOTE — Care Management Note (Signed)
Case Management Note  Patient Details  Name: SHAMIEKA GULLO MRN: [id number] Date of Birth: 04-Jul-1956  Subjective/Objective:             Confirmed p.e. Bilateral lower lobes       Action/Plan: Date:  May 09, 2015 U.R. performed for needs and level of care. Will continue to follow for Case Management needs.  Velva Harman, RN, BSN, [place]   [phone]  Expected Discharge Date:   (unknown)               Expected Discharge Plan:  Home/Self Care  In-House Referral:  NA  Discharge planning Services  CM Consult  Post Acute Care Choice:  NA Choice offered to:  NA  DME Arranged:    DME Agency:     HH Arranged:    [place] Agency:     Status of Service:  Completed, signed off  Medicare Important Message Given:    Date Medicare IM Given:    Medicare IM give by:    Date Additional Medicare IM Given:    Additional Medicare Important Message give by:     If discussed at [place] of Stay Meetings, dates discussed:    Additional Comments:  Leeroy Cha, RN 05/09/2015, 10:40 AM

## 2015-05-09 NOTE — Progress Notes (Signed)
PROGRESS NOTE  Brooke Garcia [id number] DOB: 02-27-1956 DOA: 05/08/2015 PCP: Scarlette Calico, MD   HPI: 59 yo F with protein C and S deficiency admitted on 8/25 with LLE swelling, found to have DVT and PE in the setting of Coumadin non compliance being off of it for a week.  Subjective / 24 H Interval events - left leg hurts more today  - denies chest pain, shortness of breath, palpitations  Assessment/Plan: Principal Problem:   Pulmonary emboli Active Problems:   HYPERCOAGULABLE STATE, PRIMARY   TOBACCO ABUSE   HYPERTENSION, BENIGN ESSENTIAL   DVT of lower extremity, bilateral   Anticoagulant long-term use   Hyperlipidemia with target LDL less than 130   Acute pulmonary embolus   Acute pulmonary emboli in the setting of known hypercoagulable state, lower extremity DVT - In the setting of noncompliance with Coumadin, her INR on presentation was 1.39 - I discussed with Dr. Lindi Adie on admission, OK to switch to Xarelto  - 2D echo pending today - CM consulted for Xarelto, will review echo results prior to starting Xarelto - transfer to telemetry today, has been stable overnight  HTN  - continue to hold home antihypertensives  Tobacco abuse - discussed about this again today, she is determined to quit  LLE ulcers - evaluated this morning, no apparent infection - wound care to see   Diet: Diet regular Room service appropriate?: Yes; Fluid consistency:: Thin Fluids: none  DVT Prophylaxis: Heparin gtt  Code Status: Full Code Family Communication: no family bedside  Disposition Plan: transfer to telemetry   Consultants:  None   Procedures:  None    Antibiotics  Anti-infectives    None       Studies  Ct Angio Chest Pe W/cm &/or Wo Cm  05/08/2015   CLINICAL DATA:  Right lower extremity redness and swelling. Patient has a history of DVT MP.  EXAM: CT ANGIOGRAPHY CHEST WITH CONTRAST  TECHNIQUE: Multidetector CT imaging of the chest was performed using the  standard protocol during bolus administration of intravenous contrast. Multiplanar CT image reconstructions and MIPs were obtained to evaluate the vascular anatomy.  CONTRAST:  126mL OMNIPAQUE IOHEXOL 350 MG/ML SOLN  COMPARISON:  December 06, 2007  FINDINGS: There is pulmonary embolus in the segmental pulmonary arteries of the left lower lobe, right middle lobe, right lower lobe and subsegmental pulmonary arteries of the right lower lobe. The right ventricular to left ventricular ratio is 1.05 suggesting right heart strain. There is no pulmonary infarct. Minimal atelectasis of the posterior lung bases are identified. There is no pleural effusion. There is no mediastinal or hilar lymphadenopathy. The heart size is normal. The visualized upper abdominal structures are normal. Degenerative joint changes of the spine are identified.  Review of the MIP images confirms the above findings.  IMPRESSION: Acute pulmonary embolus in bilateral lower lobes, right middle lobe. There is evidence of right heart strain. There is no evidence of pulmonary infarct.  These results will be called to the ordering clinician or representative by the Radiologist Assistant, and communication documented in the PACS or zVision Dashboard.   Electronically Signed   By: Abelardo Diesel M.D.   On: 05/08/2015 15:09    Objective  Filed Vitals:   05/09/15 0400 05/09/15 0600 05/09/15 0800 05/09/15 0839  BP: 127/64 114/50 136/87 136/87  Pulse: 83 84 85 81  Temp:   98.3 F (36.8 C)   TempSrc:   Oral   Resp: 32 24 17 14   Height:  Weight:      SpO2: 93% 89% 95% 93%    Intake/Output Summary (Last 24 hours) at 05/09/15 1039 Last data filed at 05/09/15 1000  Gross per 24 hour  Intake  359.5 ml  Output    400 ml  Net  -40.5 ml   Filed Weights   05/08/15 1207 05/08/15 1728  Weight: 81.647 kg (180 lb) 82.1 kg (181 lb)    Exam:  GENERAL: NAD  HEENT: head NCAT, no scleral icterus.  NECK: Supple.  LUNGS: Clear to auscultation.  No wheezing or crackles  HEART: Regular rate and rhythm without murmur. 2+ pulses,  ABDOMEN: Soft, nontender, and nondistended. Marland Kitchen  EXTREMITIES: RLE swelling, tender to palpation. LLE with 3-4 cm superficial wound, good granulation tissue, without purulence,   NEUROLOGIC: non focal   Data Reviewed: Basic Metabolic Panel:  Recent Labs Lab 05/08/15 1136  NA 134*  K 3.2*  CL 101  CO2 25  GLUCOSE 121*  BUN 5*  CREATININE 0.71  CALCIUM 8.4*   CBC:  Recent Labs Lab 05/08/15 1136 05/09/15 0405  WBC 10.2 9.5  HGB 13.1 12.4  HCT 38.9 36.0  MCV 100.0 100.3*  PLT 189 194    Recent Results (from the past 240 hour(s))  MRSA PCR Screening     Status: Abnormal   Collection Time: 05/08/15  6:04 PM  Result Value Ref Range Status   MRSA by PCR POSITIVE (A) NEGATIVE Final    Comment:        The GeneXpert MRSA Assay (FDA approved for NASAL specimens only), is one component of a comprehensive MRSA colonization surveillance program. It is not intended to diagnose MRSA infection nor to guide or monitor treatment for MRSA infections. RESULT CALLED TO, READ BACK BY AND VERIFIED WITH: Koren Shiver [id number] @ 4431 BY J SCOTTON      Scheduled Meds: . Chlorhexidine Gluconate Cloth  6 each Topical q morning - 10a  . collagenase   Topical Daily  . mirtazapine  30 mg Oral QHS  . mometasone-formoterol  2 puff Inhalation BID  . mupirocin ointment  1 application Nasal BID  . nicotine  21 mg Transdermal Once  . pantoprazole  40 mg Oral Daily  . sodium chloride  3 mL Intravenous Q12H  . tiotropium  18 mcg Inhalation Daily   Continuous Infusions: . heparin 1,300 Units (05/09/15 1000)    Marzetta Board, MD [facility] Pager [phone]. If 7 PM - 7 AM, please contact night-coverage at [email or web address], password [facility] 05/09/2015, 10:40 AM  LOS: 1 day

## 2015-05-09 NOTE — Progress Notes (Signed)
ANTICOAGULATION CONSULT NOTE - F/u Consult  Pharmacy Consult for heparin Indication: new pulmonary embolus and DVT  Allergies  Allergen Reactions  . Butalbital-Apap-Caffeine Hives  . Naproxen Hives  . Tylenol [Acetaminophen] Other (See Comments)    Upset stomach    Patient Measurements: Height: 5\' 3"  (160 cm) Weight: 181 lb (82.1 kg) IBW/kg (Calculated) : 52.4, height 63 inches Heparin Dosing Weight: 70 kg  Vital Signs: Temp: 99.2 F (37.3 C) (08/26 0312) Temp Source: Oral (08/26 0312) BP: 139/75 mmHg (08/26 0200) Pulse Rate: 80 (08/26 0200)  Labs:  Recent Labs  05/08/15 1136 05/09/15 0405  HGB 13.1 12.4  HCT 38.9 36.0  PLT 189 194  LABPROT 17.2*  --   INR 1.39  --   HEPARINUNFRC  --  0.30  CREATININE 0.71  --     Estimated Creatinine Clearance: 77.8 mL/min (by C-G formula based on Cr of 0.71).   Medical History: Past Medical History  Diagnosis Date  . Tobacco use disorder   . Unspecified urinary incontinence   . Complete rupture of rotator cuff   . Acute venous embolism and thrombosis of unspecified deep vessels of lower extremity   . Calculus of kidney   . Degeneration of intervertebral disc, site unspecified   . Painful respiration   . Other and unspecified noninfectious gastroenteritis and colitis(558.9)   . HTN (hypertension)   . Family history of ischemic heart disease   . Anxiety state, unspecified   . HLD (hyperlipidemia)   . Primary hypercoagulable state   . Arthritis   . COPD (chronic obstructive pulmonary disease)   . Depression   . History of gallstones   . Protein S deficiency   . Colitis   . Renal failure   . Blood dyscrasia     protein def.  . Venous stasis ulcers     left leg  . DVT (deep venous thrombosis)     Medications:  See med rec - on warf PTA.  home dose: 5mg  daily except 7.5mg  on MWSat  Assessment: Patient's a 59 y.o with hx DVT on warfarin PTA .  She reported of being out of warfarin for about 1 week.  INR  subtherapeutic at 1.39 on admission.  She presented to the ED on 8/25 with c/o RLE swelling and redness.  LE doppler now back with acute DVT in the posterior tibial vein and proximal popliteal vein.  Chest CT angio is also positive for acute bilateral lower lobes an right middle lobe PE with evidence of right heart strain.  To start heparin for VTE treatment. Of note, patient received lovenox 80mg  SQ in the ED at 1PM 8/25/ Today, 05/09/15  0405 HL=0.30, no problems per RN   Goal of Therapy:  Heparin level 0.3-0.7 units/ml Monitor platelets by anticoagulation protocol: Yes   Plan: - Increase heparin drip to 1300 units/hr -Recheck heparin level in 6 hours - monitor for s/s bleeding  Lawana Pai R 05/09/2015,5:27 AM

## 2015-05-10 ENCOUNTER — Inpatient Hospital Stay (HOSPITAL_COMMUNITY): Payer: Medicare Other

## 2015-05-10 LAB — BASIC METABOLIC PANEL
Anion gap: 7 (ref 5–15)
BUN: 5 mg/dL — AB (ref 6–20)
CO2: 29 mmol/L (ref 22–32)
CREATININE: 0.86 mg/dL (ref 0.44–1.00)
Calcium: 8.6 mg/dL — ABNORMAL LOW (ref 8.9–10.3)
Chloride: 98 mmol/L — ABNORMAL LOW (ref 101–111)
GFR calc Af Amer: >60 mL/min (ref >60)
GLUCOSE: 115 mg/dL — AB (ref 65–99)
Potassium: 4.1 mmol/L (ref 3.5–5.1)
SODIUM: 134 mmol/L — AB (ref 135–145)

## 2015-05-10 MED ORDER — LEVOFLOXACIN 500 MG PO TABS
500.0000 mg | ORAL_TABLET | Freq: Every day | ORAL | Status: DC
  Administered 2015-05-11: 500 mg via ORAL
  Filled 2015-05-10: qty 1

## 2015-05-10 MED ORDER — HYDROMORPHONE HCL 2 MG/ML IJ SOLN
1.5000 mg | INTRAMUSCULAR | Status: DC | PRN
  Administered 2015-05-10 – 2015-05-11 (×6): 1.5 mg via INTRAVENOUS
  Filled 2015-05-10 (×6): qty 1

## 2015-05-10 MED ORDER — OXYCODONE HCL 5 MG PO TABS
15.0000 mg | ORAL_TABLET | ORAL | Status: DC | PRN
  Administered 2015-05-10 – 2015-05-12 (×8): 15 mg via ORAL
  Filled 2015-05-10 (×8): qty 3

## 2015-05-10 NOTE — Progress Notes (Signed)
PROGRESS NOTE  Brooke Garcia ZHY:[id number] DOB: [birth year] DOA: 05/08/2015 PCP: Scarlette Calico, MD   HPI: 59 yo F with protein C and S deficiency admitted on 8/25 with LLE swelling, found to have DVT and PE in the setting of Coumadin non compliance being off of it for a week.  Subjective / 24 H Interval events - right leg continues to hurt - denies shortness of breath, palpitations - has more right sided chest pain today, pleuritic  Assessment/Plan: Principal Problem:   Pulmonary emboli Active Problems:   HYPERCOAGULABLE STATE, PRIMARY   TOBACCO ABUSE   HYPERTENSION, BENIGN ESSENTIAL   DVT of lower extremity, bilateral   Anticoagulant long-term use   Hyperlipidemia with target LDL less than 130   Acute pulmonary embolus   Acute pulmonary emboli in the setting of known hypercoagulable state, lower extremity DVT - In the setting of noncompliance with Coumadin, her INR on presentation was 1.39 - I discussed with Dr. Lindi Adie on admission, OK to switch to Xarelto, started on 8/26 - developed pleuritic chest pain overnight, will obtain CXR  HTN  - continue to hold home antihypertensives  Tobacco abuse - wants to quit  LLE ulcers - continue wound care   Diet: Diet regular Room service appropriate?: Yes; Fluid consistency:: Thin Fluids: none  DVT Prophylaxis: Xarelto  Code Status: Full Code Family Communication: no family bedside  Disposition Plan: home when ready   Consultants:  None   Procedures:  None    Antibiotics  Anti-infectives    None       Studies  Ct Angio Chest Pe W/cm &/or Wo Cm  05/08/2015   CLINICAL DATA:  Right lower extremity redness and swelling. Patient has a history of DVT MP.  EXAM: CT ANGIOGRAPHY CHEST WITH CONTRAST  TECHNIQUE: Multidetector CT imaging of the chest was performed using the standard protocol during bolus administration of intravenous contrast. Multiplanar CT image reconstructions and MIPs were obtained to evaluate the  vascular anatomy.  CONTRAST:  133mL OMNIPAQUE IOHEXOL 350 MG/ML SOLN  COMPARISON:  December 06, 2007  FINDINGS: There is pulmonary embolus in the segmental pulmonary arteries of the left lower lobe, right middle lobe, right lower lobe and subsegmental pulmonary arteries of the right lower lobe. The right ventricular to left ventricular ratio is 1.05 suggesting right heart strain. There is no pulmonary infarct. Minimal atelectasis of the posterior lung bases are identified. There is no pleural effusion. There is no mediastinal or hilar lymphadenopathy. The heart size is normal. The visualized upper abdominal structures are normal. Degenerative joint changes of the spine are identified.  Review of the MIP images confirms the above findings.  IMPRESSION: Acute pulmonary embolus in bilateral lower lobes, right middle lobe. There is evidence of right heart strain. There is no evidence of pulmonary infarct.  These results will be called to the ordering clinician or representative by the Radiologist Assistant, and communication documented in the PACS or zVision Dashboard.   Electronically Signed   By: Abelardo Diesel M.D.   On: 05/08/2015 15:09    Objective  Filed Vitals:   05/09/15 2105 05/10/15 0432 05/10/15 0741 05/10/15 0900  BP: 158/73 133/57  138/63  Pulse: 82 79  81  Temp: 98.4 F (36.9 C) 98.5 F (36.9 C)  98.5 F (36.9 C)  TempSrc: Oral Oral  Oral  Resp: 20 18  19   Height:      Weight:  83.008 kg (183 lb)    SpO2: 100% 100% 98% 99%  Intake/Output Summary (Last 24 hours) at 05/10/15 1047 Last data filed at 05/10/15 1000  Gross per 24 hour  Intake    720 ml  Output   1025 ml  Net   -305 ml   Filed Weights   05/08/15 1728 05/09/15 1106 05/10/15 0432  Weight: 82.1 kg (181 lb) 81.239 kg (179 lb 1.6 oz) 83.008 kg (183 lb)    Exam:  GENERAL: NAD  HEENT: head NCAT, no scleral icterus.  NECK: Supple.  LUNGS: Clear to auscultation. No wheezing or crackles  HEART: Regular rate and  rhythm without murmur. 2+ pulses,  ABDOMEN: Soft, nontender, and nondistended. Marland Kitchen  EXTREMITIES: RLE swelling, tender to palpation, appears improved. LLEwrapped  NEUROLOGIC: non focal   Data Reviewed: Basic Metabolic Panel:  Recent Labs Lab 05/08/15 1136 05/10/15 0535  NA 134* 134*  K 3.2* 4.1  CL 101 98*  CO2 25 29  GLUCOSE 121* 115*  BUN 5* 5*  CREATININE 0.71 0.86  CALCIUM 8.4* 8.6*   CBC:  Recent Labs Lab 05/08/15 1136 05/09/15 0405  WBC 10.2 9.5  HGB 13.1 12.4  HCT 38.9 36.0  MCV 100.0 100.3*  PLT 189 194    Recent Results (from the past 240 hour(s))  MRSA PCR Screening     Status: Abnormal   Collection Time: 05/08/15  6:04 PM  Result Value Ref Range Status   MRSA by PCR POSITIVE (A) NEGATIVE Final    Comment:        The GeneXpert MRSA Assay (FDA approved for NASAL specimens only), is one component of a comprehensive MRSA colonization surveillance program. It is not intended to diagnose MRSA infection nor to guide or monitor treatment for MRSA infections. RESULT CALLED TO, READ BACK BY AND VERIFIED WITH: Koren Shiver [id number] @ 6811 BY J SCOTTON      Scheduled Meds: . Chlorhexidine Gluconate Cloth  6 each Topical q morning - 10a  . collagenase   Topical Daily  . mirtazapine  30 mg Oral QHS  . mometasone-formoterol  2 puff Inhalation BID  . mupirocin ointment  1 application Nasal BID  . nicotine  21 mg Transdermal Q24H  . pantoprazole  40 mg Oral Daily  . Rivaroxaban  15 mg Oral BID WC  . sodium chloride  3 mL Intravenous Q12H  . tiotropium  18 mcg Inhalation Daily   Continuous Infusions:    Marzetta Board, MD [facility] Pager [phone]. If 7 PM - 7 AM, please contact night-coverage at [email or web address], password [facility] 05/10/2015, 10:47 AM  LOS: 2 days

## 2015-05-11 MED ORDER — COLLAGENASE 250 UNIT/GM EX OINT
TOPICAL_OINTMENT | CUTANEOUS | Status: DC
  Administered 2015-05-11: 10:00:00 via TOPICAL
  Filled 2015-05-11: qty 30

## 2015-05-11 MED ORDER — HYDROMORPHONE HCL 4 MG PO TABS
4.0000 mg | ORAL_TABLET | ORAL | Status: DC | PRN
  Administered 2015-05-11 – 2015-05-12 (×5): 4 mg via ORAL
  Filled 2015-05-11 (×5): qty 1

## 2015-05-11 NOTE — Progress Notes (Signed)
PROGRESS NOTE  Brooke Garcia UXN:[id number] DOB: 1956-07-30 DOA: 05/08/2015 PCP: Scarlette Calico, MD   HPI: 59 yo F with protein C and S deficiency admitted on 8/25 with LLE swelling, found to have DVT and PE in the setting of Coumadin non compliance being off of it for a week.  Subjective / 24 H Interval events - pain still needs to get better, both her leg and her pleuritic pain - denies palpitations, dyspnea  Assessment/Plan: Principal Problem:   Pulmonary emboli Active Problems:   HYPERCOAGULABLE STATE, PRIMARY   TOBACCO ABUSE   HYPERTENSION, BENIGN ESSENTIAL   DVT of lower extremity, bilateral   Anticoagulant long-term use   Hyperlipidemia with target LDL less than 130   Acute pulmonary embolus   Acute pulmonary emboli in the setting of known hypercoagulable state, lower extremity DVT - In the setting of noncompliance with Coumadin, her INR on presentation was 1.39 - I discussed with Dr. Lindi Adie on admission, OK to switch to Xarelto, started on 8/26 - her RLE swelling is improving - states that has a hard time walking because of her right leg, will obtain formal PT evaluation  Possible CAP - with mild worsening of her cough for past 2-3 days, has pleuritic chest pain on right and CXR with possible PNA - Levaquin x 5 days, today day 2/5  HTN  - continue to hold home antihypertensives  Tobacco abuse - wants to quit  LLE ulcers - continue wound care   Diet: Diet regular Room service appropriate?: Yes; Fluid consistency:: Thin Fluids: none  DVT Prophylaxis: Xarelto  Code Status: Full Code Family Communication: no family bedside  Disposition Plan: home tomorrow  Consultants:  None   Procedures:  None    Antibiotics  Anti-infectives    Start     Dose/Rate Route Frequency Ordered Stop   05/10/15 1830  levofloxacin (LEVAQUIN) tablet 500 mg     500 mg Oral Daily 05/10/15 1746         Studies  Dg Chest 2 View  05/10/2015   CLINICAL DATA:  Sharp  right-sided chest pain with leaving, beginning last night. History of PE.  EXAM: CHEST  2 VIEW  COMPARISON:  05/23/2014  FINDINGS: There is opacity at posterior lung base on the lateral view, likely at the right lung base. This may be due to pneumonia or atelectasis.  Lungs otherwise clear.  Cardiac silhouette is normal in size. The aorta is mildly uncoiled. No mediastinal or hilar masses or evidence of adenopathy.  No convincing pleural effusion.  No pneumothorax.  Skeletal structures are unremarkable.  IMPRESSION: Opacity in the posterior right lung base which may reflect atelectasis, but could be due to pneumonia. No other evidence of an acute abnormality. No pulmonary edema.   Electronically Signed   By: Lajean Manes M.D.   On: 05/10/2015 11:44    Objective  Filed Vitals:   05/10/15 2230 05/11/15 0513 05/11/15 0820 05/11/15 0849  BP:  124/66  130/74  Pulse:  78  74  Temp:  98.2 F (36.8 C)    TempSrc:  Oral    Resp:  28  18  Height:      Weight:      SpO2: 95% 95% 98% 100%    Intake/Output Summary (Last 24 hours) at 05/11/15 1022 Last data filed at 05/11/15 0645  Gross per 24 hour  Intake    805 ml  Output    750 ml  Net     55  ml   Filed Weights   05/08/15 1728 05/09/15 1106 05/10/15 0432  Weight: 82.1 kg (181 lb) 81.239 kg (179 lb 1.6 oz) 83.008 kg (183 lb)    Exam:  GENERAL: NAD  HEENT: head NCAT, no scleral icterus.  NECK: Supple.  LUNGS: Clear to auscultation. No wheezing or crackles  HEART: Regular rate and rhythm without murmur. 2+ pulses,  ABDOMEN: Soft, nontender, and nondistended. Marland Kitchen  EXTREMITIES: RLE swelling better, no erythema, improving. LLEwrapped  NEUROLOGIC: non focal   Data Reviewed: Basic Metabolic Panel:  Recent Labs Lab 05/08/15 1136 05/10/15 0535  NA 134* 134*  K 3.2* 4.1  CL 101 98*  CO2 25 29  GLUCOSE 121* 115*  BUN 5* 5*  CREATININE 0.71 0.86  CALCIUM 8.4* 8.6*   CBC:  Recent Labs Lab 05/08/15 1136 05/09/15 0405  WBC  10.2 9.5  HGB 13.1 12.4  HCT 38.9 36.0  MCV 100.0 100.3*  PLT 189 194    Recent Results (from the past 240 hour(s))  MRSA PCR Screening     Status: Abnormal   Collection Time: 05/08/15  6:04 PM  Result Value Ref Range Status   MRSA by PCR POSITIVE (A) NEGATIVE Final    Comment:        The GeneXpert MRSA Assay (FDA approved for NASAL specimens only), is one component of a comprehensive MRSA colonization surveillance program. It is not intended to diagnose MRSA infection nor to guide or monitor treatment for MRSA infections. RESULT CALLED TO, READ BACK BY AND VERIFIED WITH: Koren Shiver [id number] @ 2633 BY J SCOTTON      Scheduled Meds: . Chlorhexidine Gluconate Cloth  6 each Topical q morning - 10a  . collagenase   Topical Once per day on Mon Wed Fri  . levofloxacin  500 mg Oral Daily  . mirtazapine  30 mg Oral QHS  . mometasone-formoterol  2 puff Inhalation BID  . mupirocin ointment  1 application Nasal BID  . nicotine  21 mg Transdermal Q24H  . pantoprazole  40 mg Oral Daily  . Rivaroxaban  15 mg Oral BID WC  . sodium chloride  3 mL Intravenous Q12H  . tiotropium  18 mcg Inhalation Daily   Continuous Infusions:    Marzetta Board, MD [facility] Pager [phone]. If 7 PM - 7 AM, please contact night-coverage at [email or web address], password [facility] 05/11/2015, 10:22 AM  LOS: 3 days

## 2015-05-11 NOTE — Care Management Important Message (Signed)
Important Message  Patient Details  Name: Brooke Garcia MRN: [id number] Date of Birth: March 06, 1956   Medicare Important Message Given:  Yes-second notification given    Apolonio Schneiders, RN 05/11/2015, 12:03 PM

## 2015-05-12 MED ORDER — HYDROMORPHONE HCL 4 MG PO TABS: 4.0000 mg | ORAL_TABLET | Freq: Four times a day (QID) | ORAL | Status: DC | PRN

## 2015-05-12 MED ORDER — LEVOFLOXACIN 500 MG PO TABS
500.0000 mg | ORAL_TABLET | Freq: Once | ORAL | Status: AC
  Administered 2015-05-12: 500 mg via ORAL
  Filled 2015-05-12: qty 1

## 2015-05-12 MED ORDER — RIVAROXABAN (XARELTO) VTE STARTER PACK (15 & 20 MG): ORAL_TABLET | ORAL | Status: DC

## 2015-05-12 MED ORDER — NICOTINE 21 MG/24HR TD PT24: 21.0000 mg | MEDICATED_PATCH | TRANSDERMAL | Status: DC

## 2015-05-12 MED ORDER — LEVOFLOXACIN 500 MG PO TABS: 500.0000 mg | ORAL_TABLET | Freq: Every day | ORAL | Status: DC

## 2015-05-12 NOTE — Progress Notes (Signed)
D/C via w/c voices no c/o.

## 2015-05-12 NOTE — Discharge Summary (Signed)
Physician Discharge Summary  Brooke Garcia LOV:[id number] DOB: Jan 05, 1956 DOA: 05/08/2015  PCP: Brooke Calico, MD  Admit date: 05/08/2015 Discharge date: 05/12/2015  Time spent: > 35 minutes  Recommendations for Outpatient Follow-up:  1. Follow up with Brooke Garcia in 1 week   Discharge Diagnoses:  Principal Problem:   Pulmonary emboli Active Problems:   HYPERCOAGULABLE STATE, PRIMARY   TOBACCO ABUSE   HYPERTENSION, BENIGN ESSENTIAL   DVT of lower extremity, bilateral   Anticoagulant long-term use   Hyperlipidemia with target LDL less than 130   Acute pulmonary embolus  Discharge Condition: stable  Diet recommendation: regular  Filed Weights   05/08/15 1728 05/09/15 1106 05/10/15 0432  Weight: 82.1 kg (181 lb) 81.239 kg (179 lb 1.6 oz) 83.008 kg (183 lb)   History of present illness:  Brooke Garcia is a 59 y.o. female has a past medical history significant for protein C and S deficiency, history of non compliance with multiple thrombosis events, HTN, HLD, presents to the ED with right leg swelling over the last couple of days as well as increased pain in her right leg . Patient is on chronic Coumadin, she has been followed by oncology clinic (originally saw Brooke Garcia, than later saw Brooke Garcia), and she is reportedly noncompliant in the past with Coumadin clinic visits. She also has a history of left lower extremity venous ulcers currently followed in wound clinic. She tells me today that she had ran out of her Coumadin about 7 days ago and hasn't taken anything since. Per patient, she tried calling the PCPs office, she is not clear at what happen however she just had a text today from her pharmacy stating that her Coumadin is ready to pick up. She also endorses 1 episode of chest pain last night. She denies chest pain chest pain currently, she denies any shortness of breath, she denies any abdominal pain, nausea, vomiting or diarrhea. She denies any fever or chills. She continues to  smoke. She was planning to take a plane trip to [place] tomorrow morning.   In the emergency room, patient has stable vital signs, she is not tachycardic, she is on room air and her blood pressure is within normal limits. Patient underwent venous Doppler which was positive for a DVT in the right leg, she also underwent a CT angiogram of the chest which showed acute pulmonary emboli in bilateral lower lobes and right middle lobe with evidence of right heart strain. She was started on heparin drip and TRH was asked for admission.   Hospital Course:  Acute pulmonary emboli in the setting of known hypercoagulable state, lower extremity DVT - In the setting of noncompliance with Coumadin, her INR on presentation was 1.39. I have discussed with Brooke Garcia from Hematology on admission, OK to switch to Xarelto, and this was started on 8/26. Benefits/risks/side effects were discussed with the patient. Her RLE swelling is improving and her respiratory status has remained stable, patient is on room air on discharge.  Possible CAP - with mild worsening of her cough for past 2-3 days, has pleuritic chest pain on right and CXR with possible PNA, she was started on Levaquin x 5 days with 2 days left on discharge HTN - resume home medications.  Tobacco abuse - wants to quit, I prescribed nicotine patches on d/c  LLE ulcers - continue wound care as an outpatient, no evidence of active infection.   Procedures:  None    Consultations:  None  Discharge Exam: Filed Vitals:   05/11/15 1353 05/11/15 2049 05/12/15 0627 05/12/15 0855  BP: 144/64 144/77 137/62   Pulse: 74 89 78   Temp: 98.2 F (36.8 C) 98.6 F (37 C) 98.2 F (36.8 C)   TempSrc: Oral Oral Oral   Resp: 16 20 18    Height:      Weight:      SpO2: 94% 95% 97% 100%   General: NAD Cardiovascular: RRR Respiratory: CTA biL  Discharge Instructions     Medication List    STOP taking these medications        warfarin 5 MG tablet  Commonly  known as:  COUMADIN      TAKE these medications        ALPRAZolam 1 MG tablet  Commonly known as:  XANAX  Take 1 tablet (1 mg total) by mouth 3 (three) times daily as needed for anxiety.     amLODipine 10 MG tablet  Commonly known as:  NORVASC  TAKE 1 TABLET BY MOUTH ONCE DAILY     celecoxib 200 MG capsule  Commonly known as:  CELEBREX  TAKE ONE CAPSULE BY MOUTH TWICE DAILY     diphenoxylate-atropine 2.5-0.025 MG per tablet  Commonly known as:  LOMOTIL  Take 1 tablet by mouth 4 (four) times daily as needed for diarrhea or loose stools.     fluticasone-salmeterol 230-21 MCG/ACT inhaler  Commonly known as:  ADVAIR HFA  Inhale 1 puff into the lungs at bedtime.     gabapentin 600 MG tablet  Commonly known as:  NEURONTIN  TAKE 1 TABLET BY MOUTH THREE TIMES DAILY     HYDROmorphone 4 MG tablet  Commonly known as:  DILAUDID  Take 1 tablet (4 mg total) by mouth every 6 (six) hours as needed for severe pain.     levofloxacin 500 MG tablet  Commonly known as:  LEVAQUIN  Take 1 tablet (500 mg total) by mouth daily.     mirtazapine 30 MG tablet  Commonly known as:  REMERON  TAKE 1 TABLET BY MOUTH EVERY NIGHT AT BEDTIME     nicotine 21 mg/24hr patch  Commonly known as:  NICODERM CQ - dosed in mg/24 hours  Place 1 patch (21 mg total) onto the skin daily.     pantoprazole 40 MG tablet  Commonly known as:  PROTONIX  TAKE 1 TABLET BY MOUTH DAILY     Rivaroxaban 15 & 20 MG Tbpk  Commonly known as:  XARELTO STARTER PACK  Take as directed on package: Start with one 15mg  tablet by mouth twice a day with food. On Day 22, switch to one 20mg  tablet once a day with food.     terbinafine 250 MG tablet  Commonly known as:  LAMISIL  Take 1 tablet (250 mg total) by mouth daily.     tiotropium 18 MCG inhalation capsule  Commonly known as:  SPIRIVA HANDIHALER  Place 1 capsule (18 mcg total) into inhaler and inhale daily.           Follow-up Information    Follow up with Brooke Calico, MD. Schedule an appointment as soon as possible for a visit in 1 week.   Specialty:  Internal Medicine   Contact information:   [address] [address] [phone]       The results of significant diagnostics from this hospitalization (including imaging, microbiology, ancillary and laboratory) are listed below for reference.    Significant Diagnostic Studies:  Dg Chest 2 View  05/10/2015   CLINICAL DATA:  Hervey Ard right-sided chest pain with leaving, beginning last night. History of PE.  EXAM: CHEST  2 VIEW  COMPARISON:  05/23/2014  FINDINGS: There is opacity at posterior lung base on the lateral view, likely at the right lung base. This may be due to pneumonia or atelectasis.  Lungs otherwise clear.  Cardiac silhouette is normal in size. The aorta is mildly uncoiled. No mediastinal or hilar masses or evidence of adenopathy.  No convincing pleural effusion.  No pneumothorax.  Skeletal structures are unremarkable.  IMPRESSION: Opacity in the posterior right lung base which may reflect atelectasis, but could be due to pneumonia. No other evidence of an acute abnormality. No pulmonary edema.   Electronically Signed   By: Lajean Manes M.D.   On: 05/10/2015 11:44   Ct Angio Chest Pe W/cm &/or Wo Cm  05/08/2015   CLINICAL DATA:  Right lower extremity redness and swelling. Patient has a history of DVT MP.  EXAM: CT ANGIOGRAPHY CHEST WITH CONTRAST  TECHNIQUE: Multidetector CT imaging of the chest was performed using the standard protocol during bolus administration of intravenous contrast. Multiplanar CT image reconstructions and MIPs were obtained to evaluate the vascular anatomy.  CONTRAST:  159mL OMNIPAQUE IOHEXOL 350 MG/ML SOLN  COMPARISON:  December 06, 2007  FINDINGS: There is pulmonary embolus in the segmental pulmonary arteries of the left lower lobe, right middle lobe, right lower lobe and subsegmental pulmonary arteries of the right lower lobe. The right ventricular to  left ventricular ratio is 1.05 suggesting right heart strain. There is no pulmonary infarct. Minimal atelectasis of the posterior lung bases are identified. There is no pleural effusion. There is no mediastinal or hilar lymphadenopathy. The heart size is normal. The visualized upper abdominal structures are normal. Degenerative joint changes of the spine are identified.  Review of the MIP images confirms the above findings.  IMPRESSION: Acute pulmonary embolus in bilateral lower lobes, right middle lobe. There is evidence of right heart strain. There is no evidence of pulmonary infarct.  These results will be called to the ordering clinician or representative by the Radiologist Assistant, and communication documented in the PACS or zVision Dashboard.   Electronically Signed   By: Abelardo Diesel M.D.   On: 05/08/2015 15:09   Microbiology: Recent Results (from the past 240 hour(s))  MRSA PCR Screening     Status: Abnormal   Collection Time: 05/08/15  6:04 PM  Result Value Ref Range Status   MRSA by PCR POSITIVE (A) NEGATIVE Final    Comment:        The GeneXpert MRSA Assay (FDA approved for NASAL specimens only), is one component of a comprehensive MRSA colonization surveillance program. It is not intended to diagnose MRSA infection nor to guide or monitor treatment for MRSA infections. RESULT CALLED TO, READ BACK BY AND VERIFIED WITH: Koren Shiver [id number] @ 6967 BY J SCOTTON    Labs: Basic Metabolic Panel:  Recent Labs Lab 05/08/15 1136 05/10/15 0535  NA 134* 134*  K 3.2* 4.1  CL 101 98*  CO2 25 29  GLUCOSE 121* 115*  BUN 5* 5*  CREATININE 0.71 0.86  CALCIUM 8.4* 8.6*   CBC:  Recent Labs Lab 05/08/15 1136 05/09/15 0405  WBC 10.2 9.5  HGB 13.1 12.4  HCT 38.9 36.0  MCV 100.0 100.3*  PLT 189 194    Signed:  GHERGHE, COSTIN  [facility] 05/12/2015, 1:13 PM

## 2015-05-12 NOTE — Progress Notes (Signed)
Pt wants to make her own F/U appt

## 2015-05-12 NOTE — Evaluation (Signed)
Physical Therapy Evaluation-1x Patient Details Name: Brooke Garcia MRN: [id number] DOB: 01-Jun-1956 Today's Date: 05/12/2015   History of Present Illness  59 yo female admitted with PE, DVT. Hx of DVt, COPD, HTN, venous stasis ulcers, non compliance.   Clinical Impression  On eval, pt was supervision level assist for mobility-walked ~20 feet without assistive device, and 100 feet with walker. No LOB during session. VCs safety. Verbally educated pt on safe technique for stair negotiation to get into home. Encouraged use of walker if and when needed. Pt states she will d/c to son's home. No further acute PT needs.     Follow Up Recommendations No PT follow up; Intermittent supervision    Equipment Recommendations  None recommended by PT (pt states she already has a walker)    Recommendations for Other Services       Precautions / Restrictions Precautions Precautions: Fall Restrictions Weight Bearing Restrictions: No      Mobility  Bed Mobility               General bed mobility comments: pt OOB sitting on BSC  Transfers Overall transfer level: Modified independent                  Ambulation/Gait Ambulation/Gait assistance: Supervision Ambulation Distance (Feet): 100 Feet (100'x1, 20'x1) Assistive device: Rolling walker (2 wheeled);None Gait Pattern/deviations: Antalgic;Decreased stance time - left;Step-to pattern     General Gait Details: observed pt walking in room without an assistive device at supervision level. In hallway, used RW due to pt reaching out for handrail/objects in hallway/door frame to hold onto. overall, pt remained supervision level assist  Stairs Stairs:  (educated pt on "up with good, down with bad" sequence/technique. Encouraged use of handrail  as well.)          Wheelchair Mobility    Modified Rankin (Stroke Patients Only)       Balance Overall balance assessment: Needs assistance         Standing balance support:  During functional activity Standing balance-Leahy Scale: Good                               Pertinent Vitals/Pain Pain Assessment: 0-10 Pain Score: 9  Pain Location: L LE Pain Descriptors / Indicators: Aching;Sore Pain Intervention(s): Limited activity within patient's tolerance    Home Living Family/patient expects to be discharged to:: Private residence Living Arrangements: Alone             Home Equipment: Walker - 2 wheels Additional Comments: pt states she plans to d/c to son's home: house, 1 level, 2 steps to enter, rails.     Prior Function Level of Independence: Independent               Hand Dominance        Extremity/Trunk Assessment   Upper Extremity Assessment: Overall WFL for tasks assessed           Lower Extremity Assessment: Generalized weakness      Cervical / Trunk Assessment: Normal  Communication   Communication: No difficulties  Cognition Arousal/Alertness: Awake/alert Behavior During Therapy: WFL for tasks assessed/performed Overall Cognitive Status: Within Functional Limits for tasks assessed                      General Comments      Exercises        Assessment/Plan    PT Assessment Patent  does not need any further PT services  PT Diagnosis Difficulty walking;Acute pain   PT Problem List    PT Treatment Interventions     PT Goals (Current goals can be found in the Care Plan section) Acute Rehab PT Goals Patient Stated Goal: home  PT Goal Formulation: All assessment and education complete, DC therapy    Frequency     Barriers to discharge        Co-evaluation               End of Session Equipment Utilized During Treatment: Gait belt Activity Tolerance: Patient limited by pain Patient left: with call bell/phone within reach (mobiliizing in room/gathering belongings)           Time: 3833-3832 PT Time Calculation (min) (ACUTE ONLY): 14 min   Charges:   PT  Evaluation $Initial PT Evaluation Tier I: 1 Procedure     PT G Codes:        Weston Anna, MPT Pager: [phone]

## 2015-05-12 NOTE — Progress Notes (Signed)
Ambulated in hall w/ PT (see notes)pt "ready for D/C" no IV  Tele removed

## 2015-05-16 ENCOUNTER — Emergency Department (HOSPITAL_COMMUNITY): Payer: Medicare Other

## 2015-05-16 ENCOUNTER — Telehealth: Payer: Self-pay

## 2015-05-16 ENCOUNTER — Encounter (HOSPITAL_COMMUNITY): Payer: Self-pay | Admitting: Emergency Medicine

## 2015-05-16 ENCOUNTER — Ambulatory Visit (INDEPENDENT_AMBULATORY_CARE_PROVIDER_SITE_OTHER): Payer: Medicare Other | Admitting: Internal Medicine

## 2015-05-16 ENCOUNTER — Other Ambulatory Visit: Payer: Self-pay | Admitting: Internal Medicine

## 2015-05-16 ENCOUNTER — Emergency Department (HOSPITAL_COMMUNITY)
Admission: EM | Admit: 2015-05-16 | Discharge: 2015-05-16 | Disposition: A | Discharge status: Home / Self Care | Payer: Medicare Other | Attending: Emergency Medicine | Admitting: Emergency Medicine

## 2015-05-16 ENCOUNTER — Encounter: Payer: Self-pay | Admitting: Internal Medicine

## 2015-05-16 VITALS — BP 150/90 | HR 83 | Temp 98.6°F | Resp 16 | Ht 63.0 in | Wt 180.0 lb

## 2015-05-16 DIAGNOSIS — Z720 Tobacco use: Secondary | ICD-10-CM | POA: Diagnosis not present

## 2015-05-16 DIAGNOSIS — I2699 Other pulmonary embolism without acute cor pulmonale: Secondary | ICD-10-CM | POA: Diagnosis not present

## 2015-05-16 DIAGNOSIS — I10 Essential (primary) hypertension: Secondary | ICD-10-CM | POA: Insufficient documentation

## 2015-05-16 DIAGNOSIS — Z87448 Personal history of other diseases of urinary system: Secondary | ICD-10-CM | POA: Diagnosis not present

## 2015-05-16 DIAGNOSIS — M1990 Unspecified osteoarthritis, unspecified site: Secondary | ICD-10-CM | POA: Diagnosis not present

## 2015-05-16 DIAGNOSIS — Z79899 Other long term (current) drug therapy: Secondary | ICD-10-CM | POA: Diagnosis not present

## 2015-05-16 DIAGNOSIS — R079 Chest pain, unspecified: Secondary | ICD-10-CM | POA: Diagnosis present

## 2015-05-16 DIAGNOSIS — F329 Major depressive disorder, single episode, unspecified: Secondary | ICD-10-CM | POA: Diagnosis not present

## 2015-05-16 DIAGNOSIS — Z86718 Personal history of other venous thrombosis and embolism: Secondary | ICD-10-CM | POA: Insufficient documentation

## 2015-05-16 DIAGNOSIS — Z7901 Long term (current) use of anticoagulants: Secondary | ICD-10-CM | POA: Insufficient documentation

## 2015-05-16 DIAGNOSIS — Z87442 Personal history of urinary calculi: Secondary | ICD-10-CM | POA: Diagnosis not present

## 2015-05-16 DIAGNOSIS — Z8639 Personal history of other endocrine, nutritional and metabolic disease: Secondary | ICD-10-CM | POA: Diagnosis not present

## 2015-05-16 DIAGNOSIS — J449 Chronic obstructive pulmonary disease, unspecified: Secondary | ICD-10-CM | POA: Diagnosis not present

## 2015-05-16 DIAGNOSIS — R0602 Shortness of breath: Secondary | ICD-10-CM | POA: Diagnosis not present

## 2015-05-16 DIAGNOSIS — F419 Anxiety disorder, unspecified: Secondary | ICD-10-CM | POA: Insufficient documentation

## 2015-05-16 DIAGNOSIS — Z8719 Personal history of other diseases of the digestive system: Secondary | ICD-10-CM | POA: Diagnosis not present

## 2015-05-16 DIAGNOSIS — R918 Other nonspecific abnormal finding of lung field: Secondary | ICD-10-CM | POA: Diagnosis not present

## 2015-05-16 LAB — BASIC METABOLIC PANEL
Anion gap: 11 (ref 5–15)
BUN: 7 mg/dL (ref 6–20)
CHLORIDE: 99 mmol/L — AB (ref 101–111)
CO2: 27 mmol/L (ref 22–32)
CREATININE: 0.74 mg/dL (ref 0.44–1.00)
Calcium: 8.8 mg/dL — ABNORMAL LOW (ref 8.9–10.3)
GFR calc Af Amer: >60 mL/min (ref >60)
GFR calc non Af Amer: >60 mL/min (ref >60)
GLUCOSE: 120 mg/dL — AB (ref 65–99)
Potassium: 3.8 mmol/L (ref 3.5–5.1)
Sodium: 137 mmol/L (ref 135–145)

## 2015-05-16 LAB — PROTIME-INR
INR: 1.7 — ABNORMAL HIGH (ref 0.00–1.49)
Prothrombin Time: 20 seconds — ABNORMAL HIGH (ref 11.6–15.2)

## 2015-05-16 LAB — CBC
HCT: 39.1 % (ref 36.0–46.0)
Hemoglobin: 13.4 g/dL (ref 12.0–15.0)
MCH: 34.5 pg — AB (ref 26.0–34.0)
MCHC: 34.3 g/dL (ref 30.0–36.0)
MCV: 100.8 fL — AB (ref 78.0–100.0)
PLATELETS: 412 10*3/uL — AB (ref 150–400)
RBC: 3.88 MIL/uL (ref 3.87–5.11)
RDW: 13.8 % (ref 11.5–15.5)
WBC: 9.5 10*3/uL (ref 4.0–10.5)

## 2015-05-16 LAB — I-STAT TROPONIN, ED: Troponin i, poc: 0 ng/mL (ref 0.00–0.08)

## 2015-05-16 MED ORDER — FENTANYL CITRATE (PF) 100 MCG/2ML IJ SOLN
50.0000 ug | Freq: Once | INTRAMUSCULAR | Status: AC
  Administered 2015-05-16: 50 ug via INTRAVENOUS
  Filled 2015-05-16: qty 2

## 2015-05-16 MED ORDER — ENOXAPARIN SODIUM 100 MG/ML ~~LOC~~ SOLN
90.0000 mg | Freq: Once | SUBCUTANEOUS | Status: AC
  Administered 2015-05-16: 90 mg via SUBCUTANEOUS
  Filled 2015-05-16: qty 1

## 2015-05-16 MED ORDER — IOHEXOL 350 MG/ML SOLN
100.0000 mL | Freq: Once | INTRAVENOUS | Status: AC | PRN
  Administered 2015-05-16: 100 mL via INTRAVENOUS

## 2015-05-16 NOTE — Discharge Instructions (Signed)
Pulmonary Embolism A pulmonary (lung) embolism (PE) is a blood clot that has traveled to the lung and results in a blockage of blood flow in the affected lung. Most clots come from deep veins in the legs or pelvis. PE is a dangerous and potentially life-threatening condition that can be treated if identified. CAUSES Blood clots form in a vein for different reasons. Usually several things cause blood clots. They include:  The flow of blood slows down.  The inside of the vein is damaged in some way.  The person has a condition that makes the blood clot more easily. RISK FACTORS Some people are more likely than others to develop PE. Risk factors include:   Smoking.  Being overweight (obese).  Sitting or lying still for a long time. This includes long-distance travel, paralysis, or recovery from an illness or surgery. Other factors that increase risk are:   Older age, especially over 72 years of age.  Having a family history of blood clots or if you have already had a blood clot.  Having major or lengthy surgery. This is especially true for surgery on the hip, knee, or belly (abdomen). Hip surgery is particularly high risk.  Having a long, thin tube (catheter) placed inside a vein during a medical procedure.  Breaking a hip or leg.  Having cancer or cancer treatment.  Medicines containing the female hormone estrogen. This includes birth control pills and hormone replacement therapy.  Other circulation or heart problems.  Pregnancy and childbirth.  Hormone changes make the blood clot more easily during pregnancy.  The fetus puts pressure on the veins of the pelvis.  There is a risk of injury to veins during delivery or a caesarean delivery. The risk is highest just after childbirth.  PREVENTION   Exercise the legs regularly. Take a brisk 30 minute walk every day.  Maintain a weight that is appropriate for your height.  Avoid sitting or lying in bed for long periods of  time without moving your legs.  Women, particularly those over the age of 28 years, should consider the risks and benefits of taking estrogen medicines, including birth control pills.  Do not smoke, especially if you take estrogen medicines.  Long-distance travel can increase your risk. You should exercise your legs by walking or pumping the muscles every hour.  Many of the risk factors above relate to situations that exist with hospitalization, either for illness, injury, or elective surgery. Prevention may include medical and nonmedical measures.   Your health care provider will assess you for the need for venous thromboembolism prevention when you are admitted to the hospital. If you are having surgery, your surgeon will assess you the day of or day after surgery.  SYMPTOMS  The symptoms of a PE usually start suddenly and include:  Shortness of breath.  Coughing.  Coughing up blood or blood-tinged mucus.  Chest pain. Pain is often worse with deep breaths.  Rapid heartbeat. DIAGNOSIS  If a PE is suspected, your health care provider will take a medical history and perform a physical exam. Other tests that may be required include:  Blood tests, such as studies of the clotting properties of your blood.  Imaging tests, such as ultrasound, CT, MRI, and other tests to see if you have clots in your legs or lungs.  An electrocardiogram. This can look for heart strain from blood clots in the lungs. TREATMENT   The most common treatment for a PE is blood thinning (anticoagulant) medicine, which reduces  the blood's tendency to clot. Anticoagulants can stop new blood clots from forming and old clots from growing. They cannot dissolve existing clots. Your body does this by itself over time. Anticoagulants can be given by mouth, through an intravenous (IV) tube, or by injection. Your health care provider will determine the best program for you.  Less commonly, clot-dissolving medicines  (thrombolytics) are used to dissolve a PE. They carry a high risk of bleeding, so they are used mainly in severe cases.  Very rarely, a blood clot in the leg needs to be removed surgically.  If you are unable to take anticoagulants, your health care provider may arrange for you to have a filter placed in a main vein in your abdomen. This filter prevents clots from traveling to your lungs. HOME CARE INSTRUCTIONS   Take all medicines as directed by your health care provider.  Learn as much as you can about DVT.  Wear a medical alert bracelet or carry a medical alert card.  Ask your health care provider how soon you can go back to normal activities. It is important to stay active to prevent blood clots. If you are on anticoagulant medicine, avoid contact sports.  It is very important to exercise. This is especially important while traveling, sitting, or standing for long periods of time. Exercise your legs by walking or by tightening and relaxing your leg muscles regularly. Take frequent walks.  You may need to wear compression stockings. These are tight elastic stockings that apply pressure to the lower legs. This pressure can help keep the blood in the legs from clotting. Taking Warfarin Warfarin is a daily medicine that is taken by mouth. Your health care provider will advise you on the length of treatment (usually 3-6 months, sometimes lifelong). If you take warfarin:  Understand how to take warfarin and foods that can affect how warfarin works in Veterinary surgeon.  Too much and too little warfarin are both dangerous. Too much warfarin increases the risk of bleeding. Too little warfarin continues to allow the risk for blood clots. Warfarin and Regular Blood Testing While taking warfarin, you will need to have regular blood tests to measure your blood clotting time. These blood tests usually include both the prothrombin time (PT) and international normalized ratio (INR) tests. The PT and INR  results allow your health care provider to adjust your dose of warfarin. It is very important that you have your PT and INR tested as often as directed by your health care provider.  Warfarin and Your Diet Avoid major changes in your diet, or notify your health care provider before changing your diet. Arrange a visit with a registered dietitian to answer your questions. Many foods, especially foods high in vitamin K, can interfere with warfarin and affect the PT and INR results. You should eat a consistent amount of foods high in vitamin K. Foods high in vitamin K include:   Spinach, kale, broccoli, cabbage, collard and turnip greens, Brussels sprouts, peas, cauliflower, seaweed, and parsley.  Beef and pork liver.  Green tea.  Soybean oil. Warfarin with Other Medicines Many medicines can interfere with warfarin and affect the PT and INR results. You must:  Tell your health care provider about any and all medicines, vitamins, and supplements you take, including aspirin and other over-the-counter anti-inflammatory medicines. Be especially cautious with aspirin and anti-inflammatory medicines. Ask your health care provider before taking these.  Do not take or discontinue any prescribed or over-the-counter medicine except on the advice  of your health care provider or pharmacist. Warfarin Side Effects Warfarin can have side effects, such as easy bruising and difficulty stopping bleeding. Ask your health care provider or pharmacist about other side effects of warfarin. You will need to:  Hold pressure over cuts for longer than usual.  Notify your dentist and other health care providers that you are taking warfarin before you undergo any procedures where bleeding may occur. Warfarin with Alcohol and Tobacco   Drinking alcohol frequently can increase the effect of warfarin, leading to excess bleeding. It is best to avoid alcoholic drinks or consume only very small amounts while taking warfarin.  Notify your health care provider if you change your alcohol intake.  Do not use any tobacco products including cigarettes, chewing tobacco, or electronic cigarettes. If you smoke, quit. Ask your health care provider for help with quitting smoking. Alternative Medicines to Warfarin: Factor Xa Inhibitor Medicines  These blood thinning medicines are taken by mouth, usually for several weeks or longer. It is important to take the medicine every single day, at the same time each day.  There are no regular blood tests required when using these medicines.  There are fewer food and drug interactions than with warfarin.  The side effects of this class of medicine is similar to that of warfarin, including excessive bruising or bleeding. Ask your health care provider or pharmacist about other potential side effects. SEEK MEDICAL CARE IF:   You notice a rapid heartbeat.  You feel weaker or more tired than usual.  You feel faint.  You notice increased bruising.  Your symptoms are not getting better in the time expected.  You are having side effects of medicine. SEEK IMMEDIATE MEDICAL CARE IF:   You have chest pain.  You have trouble breathing.  You have new or increased swelling or pain in one leg.  You cough up blood.  You notice blood in vomit, in a bowel movement, or in urine.  You have a fever. Symptoms of PE may represent a serious problem that is an emergency. Do not wait to see if the symptoms will go away. Get medical help right away. Call your local emergency services (911 in the [place]). Do not drive yourself to the hospital. Document Released: 08/27/2000 Document Revised: 01/14/2014 Document Reviewed: 09/10/2013 [facility] Patient Information 2015 [place], [place]. This information is not intended to replace advice given to you by your health care provider. Make sure you discuss any questions you have with your health care provider.  TAKE YOUR XARELTO AS DIRECTED

## 2015-05-16 NOTE — Progress Notes (Signed)
Subjective:  Patient ID: Brooke Garcia, female    DOB: 16-Aug-1956  Age: 59 y.o. MRN: [id number]  CC: Chest Pain   HPI Brooke Garcia presents for follow-up after recent admission for pulmonary emboli and bilateral DVT. At the time of discharge she was asked to start Xarelto. She was afraid of Xarelto so she did not start taking it. She has instead gone back to her Coumadin 5 mg a day. She returns today and stating for the last day and a half she has had stabbing right-sided chest pain with cough and shortness of breath. She also complains of pain and swelling in both legs.  Outpatient Prescriptions Prior to Visit  Medication Sig Dispense Refill  . ALPRAZolam (XANAX) 1 MG tablet Take 1 tablet (1 mg total) by mouth 3 (three) times daily as needed for anxiety. 90 tablet 3  . celecoxib (CELEBREX) 200 MG capsule TAKE ONE CAPSULE BY MOUTH TWICE DAILY (Patient taking differently: TAKE 200 MG BY MOUTH ONCE DAILY) 180 capsule 0  . fluticasone-salmeterol (ADVAIR HFA) 230-21 MCG/ACT inhaler Inhale 1 puff into the lungs at bedtime. (Patient taking differently: Inhale 1 puff into the lungs daily as needed (shortness of breath, wheezing). ) 1 Inhaler 12  . HYDROmorphone (DILAUDID) 4 MG tablet Take 1 tablet (4 mg total) by mouth every 6 (six) hours as needed for severe pain. 30 tablet 0  . levofloxacin (LEVAQUIN) 500 MG tablet Take 1 tablet (500 mg total) by mouth daily. 2 tablet 0  . mirtazapine (REMERON) 30 MG tablet TAKE 1 TABLET BY MOUTH EVERY NIGHT AT BEDTIME (Patient taking differently: TAKE 30  MG BY MOUTH EVERY NIGHT AT BEDTIME) 90 tablet 0  . nicotine (NICODERM CQ - DOSED IN MG/24 HOURS) 21 mg/24hr patch Place 1 patch (21 mg total) onto the skin daily. 28 patch 0  . pantoprazole (PROTONIX) 40 MG tablet TAKE 1 TABLET BY MOUTH DAILY (Patient taking differently: TAKE 40 MG BY MOUTH DAILY) 90 tablet 0  . Rivaroxaban (XARELTO STARTER PACK) 15 & 20 MG TBPK Take as directed on package: Start with one 15mg   tablet by mouth twice a day with food. On Day 22, switch to one 20mg  tablet once a day with food. 51 each 0  . terbinafine (LAMISIL) 250 MG tablet Take 1 tablet (250 mg total) by mouth daily. 30 tablet 0  . amLODipine (NORVASC) 10 MG tablet TAKE 1 TABLET BY MOUTH ONCE DAILY (Patient not taking: Reported on 05/16/2015) 90 tablet 2  . diphenoxylate-atropine (LOMOTIL) 2.5-0.025 MG per tablet Take 1 tablet by mouth 4 (four) times daily as needed for diarrhea or loose stools. (Patient not taking: Reported on 05/16/2015) 120 tablet 0  . tiotropium (SPIRIVA HANDIHALER) 18 MCG inhalation capsule Place 1 capsule (18 mcg total) into inhaler and inhale daily. (Patient not taking: Reported on 05/16/2015) 30 capsule 12  . gabapentin (NEURONTIN) 600 MG tablet TAKE 1 TABLET BY MOUTH THREE TIMES DAILY (Patient not taking: Reported on 05/16/2015) 270 tablet 0   No facility-administered medications prior to visit.    ROS Review of Systems  Constitutional: Positive for fatigue. Negative for fever, chills and diaphoresis.  HENT: Negative.   Eyes: Negative.   Respiratory: Positive for cough and shortness of breath. Negative for apnea, choking, chest tightness, wheezing and stridor.   Cardiovascular: Positive for chest pain. Negative for palpitations and leg swelling.  Gastrointestinal: Negative.  Negative for nausea, vomiting, abdominal pain, diarrhea, constipation and blood in stool.  Endocrine: Negative.  Genitourinary: Negative.   Musculoskeletal: Positive for arthralgias. Negative for myalgias, back pain and neck stiffness.  Skin: Positive for color change. Negative for rash.       Toes on the left foot are turning blue according to her report.  Allergic/Immunologic: Negative.   Neurological: Negative.   Hematological: Negative.  Negative for adenopathy. Does not bruise/bleed easily.  Psychiatric/Behavioral: Positive for sleep disturbance. The patient is nervous/anxious.     Objective:  BP 150/90 mmHg  Pulse  83  Temp(Src) 98.6 F (37 C) (Oral)  Ht 5\' 3"  (1.6 m)  Wt 180 lb (81.647 kg)  BMI 31.89 kg/m2  SpO2 97%  BP Readings from Last 3 Encounters:  05/16/15 150/90  05/12/15 137/62  01/09/15 148/92    Wt Readings from Last 3 Encounters:  05/16/15 180 lb (81.647 kg)  05/10/15 183 lb (83.008 kg)  01/09/15 184 lb (83.462 kg)    Physical Exam  Constitutional: No distress.  HENT:  Mouth/Throat: Oropharynx is clear and moist. No oropharyngeal exudate.  Eyes: Conjunctivae are normal. Right eye exhibits no discharge. Left eye exhibits no discharge. No scleral icterus.  Neck: Normal range of motion. Neck supple. No JVD present. No tracheal deviation present. No thyromegaly present.  Cardiovascular: Normal rate, regular rhythm, normal heart sounds and intact distal pulses.  Exam reveals no gallop and no friction rub.   No murmur heard. Pulses:      Carotid pulses are 1+ on the right side.      Radial pulses are 1+ on the right side, and 1+ on the left side.       Femoral pulses are 1+ on the right side, and 1+ on the left side.      Popliteal pulses are 1+ on the right side, and 1+ on the left side.       Dorsalis pedis pulses are 1+ on the right side. Left dorsalis pedis pulse not accessible.       Posterior tibial pulses are 1+ on the right side. Left posterior tibial pulse not accessible.  Pulmonary/Chest: Effort normal. No accessory muscle usage or stridor. No tachypnea. No respiratory distress. She has decreased breath sounds in the right lower field and the left lower field. She has no wheezes. She has no rales. She exhibits no tenderness.  Abdominal: Soft. Bowel sounds are normal. She exhibits no distension and no mass. There is no tenderness. There is no rebound and no guarding.  Musculoskeletal: Normal range of motion. She exhibits edema. She exhibits no tenderness.  Toes of the right foot are cool but there is good capillary refill. Toes of the left foot are slightly dusky but there  is also good capillary refill. I can't check the pulses in her left foot due to a dressing. Pulses in the right foot are palpable.  Lymphadenopathy:    She has no cervical adenopathy.  Skin: Skin is warm and dry. No rash noted. She is not diaphoretic. No erythema. No pallor.  Vitals reviewed.   Lab Results  Component Value Date   WBC 9.5 05/09/2015   HGB 12.4 05/09/2015   HCT 36.0 05/09/2015   PLT 194 05/09/2015   GLUCOSE 115* 05/10/2015   CHOL 170 01/09/2015   TRIG 130.0 01/09/2015   HDL 43.20 01/09/2015   LDLCALC 101* 01/09/2015   ALT 9 01/09/2015   AST 17 01/09/2015   NA 134* 05/10/2015   K 4.1 05/10/2015   CL 98* 05/10/2015   CREATININE 0.86 05/10/2015   BUN 5*  05/10/2015   CO2 29 05/10/2015   TSH 1.02 01/09/2015   INR 1.39 05/08/2015    Ct Angio Chest Pe W/cm &/or Wo Cm  05/08/2015   CLINICAL DATA:  Right lower extremity redness and swelling. Patient has a history of DVT MP.  EXAM: CT ANGIOGRAPHY CHEST WITH CONTRAST  TECHNIQUE: Multidetector CT imaging of the chest was performed using the standard protocol during bolus administration of intravenous contrast. Multiplanar CT image reconstructions and MIPs were obtained to evaluate the vascular anatomy.  CONTRAST:  159mL OMNIPAQUE IOHEXOL 350 MG/ML SOLN  COMPARISON:  December 06, 2007  FINDINGS: There is pulmonary embolus in the segmental pulmonary arteries of the left lower lobe, right middle lobe, right lower lobe and subsegmental pulmonary arteries of the right lower lobe. The right ventricular to left ventricular ratio is 1.05 suggesting right heart strain. There is no pulmonary infarct. Minimal atelectasis of the posterior lung bases are identified. There is no pleural effusion. There is no mediastinal or hilar lymphadenopathy. The heart size is normal. The visualized upper abdominal structures are normal. Degenerative joint changes of the spine are identified.  Review of the MIP images confirms the above findings.  IMPRESSION:  Acute pulmonary embolus in bilateral lower lobes, right middle lobe. There is evidence of right heart strain. There is no evidence of pulmonary infarct.  These results will be called to the ordering clinician or representative by the Radiologist Assistant, and communication documented in the PACS or zVision Dashboard.   Electronically Signed   By: Abelardo Diesel M.D.   On: 05/08/2015 15:09    Assessment & Plan:   Brooke Garcia was seen today for chest pain.  Diagnoses and all orders for this visit:  HYPERTENSION, BENIGN ESSENTIAL  I have discontinued Brooke Garcia's gabapentin. I am also having her maintain her tiotropium, fluticasone-salmeterol, diphenoxylate-atropine, mirtazapine, pantoprazole, celecoxib, ALPRAZolam, terbinafine, amLODipine, HYDROmorphone, levofloxacin, nicotine, Rivaroxaban, warfarin, SANTYL, and warfarin.  Meds ordered this encounter  Medications  . warfarin (COUMADIN) 5 MG tablet    Sig: Sun, tues, thurs, Sat  . SANTYL ointment    Sig:   . warfarin (COUMADIN) 7.5 MG tablet    Sig: Take 7.5 mg by mouth daily. Mon, Wed, Fri     Follow-up: No Follow-up on file.  Scarlette Calico, MD

## 2015-05-16 NOTE — ED Provider Notes (Signed)
CSN: [id number]     Arrival date & time 05/16/15  1605 History   First MD Initiated Contact with Patient 05/16/15 1659     Chief Complaint  Patient presents with  . Chest Pain     (Consider location/radiation/quality/duration/timing/severity/associated sxs/prior Treatment) Patient is a 59 y.o. female presenting with chest pain. The history is provided by the patient.  Chest Pain Pain location:  R lateral chest Pain quality: burning and sharp   Pain radiates to:  Does not radiate Pain severity:  Moderate Onset quality:  Gradual Duration:  3 days Timing:  Constant Progression:  Unchanged Chronicity:  Recurrent Context: breathing   Relieved by:  Nothing Worsened by:  Nothing tried Ineffective treatments:  None tried Associated symptoms: lower extremity edema (right side, known DVT)   Associated symptoms: no fever, no shortness of breath and no syncope   Risk factors: prior DVT/PE     Past Medical History  Diagnosis Date  . Tobacco use disorder   . Unspecified urinary incontinence   . Complete rupture of rotator cuff   . Acute venous embolism and thrombosis of unspecified deep vessels of lower extremity   . Calculus of kidney   . Degeneration of intervertebral disc, site unspecified   . Painful respiration   . Other and unspecified noninfectious gastroenteritis and colitis(558.9)   . HTN (hypertension)   . Family history of ischemic heart disease   . Anxiety state, unspecified   . HLD (hyperlipidemia)   . Primary hypercoagulable state   . Arthritis   . COPD (chronic obstructive pulmonary disease)   . Depression   . History of gallstones   . Protein S deficiency   . Colitis   . Renal failure   . Blood dyscrasia     protein def.  . Venous stasis ulcers     left leg  . DVT (deep venous thrombosis)    Past Surgical History  Procedure Laterality Date  . Thrombectomy / embolectomy femoral artery      DVT  . Nasal sinus surgery    . Tubal ligation    . Rotator cuff  repair      right  . Tubal ligation reversal    . Colonoscopy  04/27/2012    Procedure: COLONOSCOPY;  Surgeon: Jerene Bears, MD;  Location: [facility];  Service: Gastroenterology;  Laterality: N/A;  . Endovenous ablation saphenous vein w/ laser Left 07-04-2014    EVLA left greater saphenous vein by Curt Jews MD  . Endovenous ablation saphenous vein w/ laser Right 07-17-2014    EVLA roght greater saphenous vein by Curt Jews MD   Family History  Problem Relation Age of Onset  . Breast cancer Mother     mets to liver  . Stroke Mother   . Heart attack Father   . Lung cancer Father   . Other Father     Protein S Deficiency  . Clotting disorder Father   . Diabetes Father   . Heart disease Father   . Cancer Father     Lung  . Hypertension Maternal Grandmother   . Colon cancer Maternal Grandmother   . Heart disease Maternal Grandmother   . Heart attack Paternal Grandmother   . Diabetes Sister   . Stomach cancer Neg Hx   . Alzheimer's disease Maternal Grandfather   . Tuberculosis Paternal Grandmother   . Hypertension Brother   . Ulcers Brother    Social History  Substance Use Topics  . Smoking status: Current Every  Day Smoker -- 0.50 packs/day for 30 years    Types: Cigarettes  . Smokeless tobacco: Never Used     Comment: 0.5 packs per day  . Alcohol Use: No   OB History    No data available     Review of Systems  Constitutional: Negative for fever.  Respiratory: Negative for shortness of breath.   Cardiovascular: Positive for chest pain. Negative for syncope.  All other systems reviewed and are negative.     Allergies  Butalbital-apap-caffeine; Naproxen; and Tylenol  Home Medications   Prior to Admission medications   Medication Sig Start Date End Date Taking? Authorizing Provider  ALPRAZolam Duanne Moron) 1 MG tablet Take 1 tablet (1 mg total) by mouth 3 (three) times daily as needed for anxiety. 01/09/15  Yes Janith Lima, MD  celecoxib (CELEBREX) 200 MG  capsule TAKE ONE CAPSULE BY MOUTH TWICE DAILY Patient taking differently: TAKE 200 MG BY MOUTH ONCE DAILY 01/04/15  Yes Janith Lima, MD  fluticasone-salmeterol (ADVAIR HFA) 230-21 MCG/ACT inhaler Inhale 1 puff into the lungs at bedtime. Patient taking differently: Inhale 1 puff into the lungs daily as needed (shortness of breath, wheezing).  03/20/14  Yes Janith Lima, MD  HYDROmorphone (DILAUDID) 4 MG tablet Take 1 tablet (4 mg total) by mouth every 6 (six) hours as needed for severe pain. 05/12/15  Yes Costin Karlyne Greenspan, MD  mirtazapine (REMERON) 30 MG tablet TAKE 1 TABLET BY MOUTH EVERY NIGHT AT BEDTIME Patient taking differently: TAKE 30  MG BY MOUTH EVERY NIGHT AT BEDTIME 01/04/15  Yes Janith Lima, MD  pantoprazole (PROTONIX) 40 MG tablet TAKE 1 TABLET BY MOUTH DAILY Patient taking differently: TAKE 40 MG BY MOUTH DAILY 01/04/15  Yes Janith Lima, MD  SANTYL ointment Apply 1 application topically daily.  04/28/15  Yes Historical Provider, MD  warfarin (COUMADIN) 5 MG tablet Take 5-7.5 mg by mouth daily. Take 5 mg (1 tablet) on Sun, Tues, Thurs, Sat and Take 7.5 mg (1.5 tablet) on Mon, Wed, Fri 05/13/15  Yes Historical Provider, MD  amLODipine (NORVASC) 10 MG tablet TAKE 1 TABLET BY MOUTH ONCE DAILY Patient not taking: Reported on 05/16/2015 01/14/15   Janith Lima, MD  diphenoxylate-atropine (LOMOTIL) 2.5-0.025 MG per tablet Take 1 tablet by mouth 4 (four) times daily as needed for diarrhea or loose stools. Patient not taking: Reported on 05/16/2015 05/31/14   Jerene Bears, MD  levofloxacin (LEVAQUIN) 500 MG tablet Take 1 tablet (500 mg total) by mouth daily. Patient not taking: Reported on 05/16/2015 05/12/15   Caren Griffins, MD  nicotine (NICODERM CQ - DOSED IN MG/24 HOURS) 21 mg/24hr patch Place 1 patch (21 mg total) onto the skin daily. 05/12/15   Costin Karlyne Greenspan, MD  Rivaroxaban (XARELTO STARTER PACK) 15 & 20 MG TBPK Take as directed on package: Start with one 15mg  tablet by mouth twice a day  with food. On Day 22, switch to one 20mg  tablet once a day with food. 05/12/15   Costin Karlyne Greenspan, MD  terbinafine (LAMISIL) 250 MG tablet Take 1 tablet (250 mg total) by mouth daily. Patient not taking: Reported on 05/16/2015 01/09/15   Janith Lima, MD  tiotropium (SPIRIVA HANDIHALER) 18 MCG inhalation capsule Place 1 capsule (18 mcg total) into inhaler and inhale daily. Patient not taking: Reported on 05/16/2015 03/20/14   Janith Lima, MD   BP 135/71 mmHg  Pulse 85  Temp(Src) 98.6 F (37 C) (Oral)  Resp 21  SpO2 94% Physical Exam  Constitutional: She is oriented to person, place, and time. She appears well-developed and well-nourished. No distress.  HENT:  Head: Normocephalic.  Eyes: Conjunctivae are normal.  Neck: Neck supple. No tracheal deviation present.  Cardiovascular: Normal rate, regular rhythm and normal heart sounds.   Pulses:      Dorsalis pedis pulses are 2+ on the right side, and 2+ on the left side.  Pulmonary/Chest: Effort normal and breath sounds normal. No respiratory distress.  Abdominal: Soft. She exhibits no distension.  Musculoskeletal:       Right lower leg: She exhibits tenderness (minimal proximal calf tenderness) and edema.  No cyanosis, pallor, no pain with passive range of motion  Neurological: She is alert and oriented to person, place, and time.  Skin: Skin is warm and dry.  Psychiatric: She has a normal mood and affect.    ED Course  Procedures (including critical care time) Labs Review Labs Reviewed  CBC - Abnormal; Notable for the following:    MCV 100.8 (*)    MCH 34.5 (*)    Platelets 412 (*)    All other components within normal limits  BASIC METABOLIC PANEL  PROTIME-INR  Randolm Idol, ED    Imaging Review Dg Chest 2 View  05/16/2015   CLINICAL DATA:  Patient with shortness of breath and right-sided chest pain.  EXAM: CHEST  2 VIEW  COMPARISON:  Chest radiograph 05/10/2015  FINDINGS: Monitoring leads overlie the patient. Stable  cardiac and mediastinal contours. Tortuosity of the thoracic aorta. Improving linear opacity right lung base. Remainder of the lungs are clear. Small right pleural effusion. Regional skeleton is unremarkable.  IMPRESSION: Improving focal opacity right lung base may represent atelectasis, infection and/or infarct.   Electronically Signed   By: Lovey Newcomer M.D.   On: 05/16/2015 17:15   Ct Angio Chest Pe W/cm &/or Wo Cm  05/16/2015   CLINICAL DATA:  Patient was just discharged with PEs and DVTs. Shortness of breath for 2 weeks. Right chest pain started yesterday.  EXAM: CT ANGIOGRAPHY CHEST WITH CONTRAST  TECHNIQUE: Multidetector CT imaging of the chest was performed using the standard protocol during bolus administration of intravenous contrast. Multiplanar CT image reconstructions and MIPs were obtained to evaluate the vascular anatomy.  CONTRAST:  143mL OMNIPAQUE IOHEXOL 350 MG/ML SOLN  COMPARISON:  05/16/2015 chest x-ray and CT of the chest 05/08/2015  FINDINGS: Heart:  Right heart strain, indicated by RV LV ratio of 1.1.  Vascular structures: The pulmonary arteries are well opacified by contrast bolus. Persistent but less discrete thrombus identified within the lower lobe branches of the pulmonary arteries bilaterally. No new emboli are identified. Ascending aorta is 4.0 cm.  Mediastinum/thyroid: The visualized portion of the thyroid gland has a normal appearance. No mediastinal, hilar, or axillary adenopathy.  Lungs/Airways: There has been development of numerous nodular areas of infiltrate involving the left upper lobe, likely infectious. The appearance is not typical for infarct. Bilateral atelectasis is also new since prior study.  Upper abdomen: Unremarkable.  Chest wall/osseous structures: Degenerative changes are seen in the spine.  Review of the MIP images confirms the above findings.  IMPRESSION: 1. Persistent but less discrete thrombus within the lower lobe pulmonary arteries bilaterally. 2. No new  emboli identified. 3. Persistent right heart strain. 4. Interval development of nodular infiltrate within the left upper lobe, likely infectious and atypical for infarct. 5. Ascending thoracic aorta is 4.0 cm. Recommend annual imaging followup by CTA or MRA. This  recommendation follows 2010 ACCF/AHA/AATS/ACR/ASA/SCA/SCAI/SIR/STS/SVM Guidelines for the Diagnosis and Management of Patients with Thoracic Aortic Disease. Circulation. 2010; 121: Z169-C789   Electronically Signed   By: Nolon Nations M.D.   On: 05/16/2015 18:24   I have personally reviewed and evaluated these images and lab results as part of my medical decision-making.   EKG Interpretation None      MDM   Final diagnoses:  Pulmonary emboli    59 year old female with history of pulmonary embolism and DVT during recent admission to the hospital presents with worsening right-sided chest pain and ongoing right leg pain. She states that she has been noncompliant with Xarelto therapy due to fear of recent litigation that they have received. She has been taking her Coumadin at home unaware that this is not immediately effective. She saw her doctor today who are concerned for phlegmasia, although she has no significant pain with passive range of motion, no color change to her leg, and intact distal pedal pulse and I have low suspicion currently for proximal occlusive clot. CT scan was ordered and showed no worsening clot burden in her lungs, she was given a dose of Lovenox here for any quite relation and agreed to take her Xarelto when she gets home as previously instructed after reassurance. Her vital signs are stable throughout her emergency department course including no evidence of tachycardia, hypoxemia, or other reason for inpatient management currently. Pain would be highly atypical for ACS, more likely related to ongoing inflammation related to recent acute embolus. No infectious symptoms or indication for antibiotic therapy. Plan to  follow up with PCP as needed and return precautions discussed for worsening or new concerning symptoms.     Leo Grosser, MD 05/16/15 [phone]

## 2015-05-16 NOTE — ED Notes (Signed)
Per pt, states she was just discharged with PEs and DVTs, PNA-SOB for 2 weeks-right chest pain which started yesterday-sent here by PCP

## 2015-05-16 NOTE — Progress Notes (Signed)
Pre visit review using our clinic review tool, if applicable. No additional management support is needed unless otherwise documented below in the visit note. 

## 2015-05-16 NOTE — ED Notes (Signed)
Patient transported to CT 

## 2015-05-16 NOTE — Telephone Encounter (Signed)
Spoke with pt during her OV regarding mammogram. Pt agreed. An order will be put in, pt is aware someone will be contacting her to schedule an apt.

## 2015-05-16 NOTE — Assessment & Plan Note (Addendum)
She appears to have a recurrence of pulmonary emboli and DVT. When she was discharged from the hospital she was asked to start Xarelto but she was afraid of it so she did not start it. She instead started Coumadin. Essentially for the last 4 days she has not been anticoagulated and I'm assuming that she is developing more clotting . I am also concerned that she may be developing phlegmasia cerulea dolens. I've asked her to return to the hospital for an emergent evaluation for recurrent clotting and urgent anticoagulation.

## 2015-05-20 ENCOUNTER — Other Ambulatory Visit: Payer: Self-pay | Admitting: Internal Medicine

## 2015-05-20 DIAGNOSIS — F418 Other specified anxiety disorders: Secondary | ICD-10-CM

## 2015-05-20 MED ORDER — ALPRAZOLAM 1 MG PO TABS: 1.0000 mg | ORAL_TABLET | Freq: Two times a day (BID) | ORAL | Status: DC | PRN

## 2015-05-20 NOTE — Telephone Encounter (Signed)
Pt stated during last OV she was taking the inhalers listed below. She was informed she was not using them correctly per another provider. Pt is now requesting an emergency inhaler. Pended med Rf as well

## 2015-05-20 NOTE — Telephone Encounter (Signed)
Patient is requesting refill for ALPRAZolam Duanne Moron) 1 MG tablet [[id number] She does have an appointment for Monday Pharmacy is [facility] on [address] and [address]

## 2015-05-20 NOTE — Telephone Encounter (Signed)
Patient called back in to advise that she also needs an emergency inhaler, as advised by the hospital. She states that she is no longer taking spireva or advair.

## 2015-05-21 ENCOUNTER — Other Ambulatory Visit: Payer: Self-pay | Admitting: Internal Medicine

## 2015-05-21 NOTE — Telephone Encounter (Signed)
Script faxed for xanax

## 2015-05-23 ENCOUNTER — Encounter (HOSPITAL_BASED_OUTPATIENT_CLINIC_OR_DEPARTMENT_OTHER): Payer: Medicare Other | Attending: Internal Medicine

## 2015-05-23 DIAGNOSIS — I739 Peripheral vascular disease, unspecified: Secondary | ICD-10-CM | POA: Insufficient documentation

## 2015-05-23 DIAGNOSIS — Z7901 Long term (current) use of anticoagulants: Secondary | ICD-10-CM | POA: Insufficient documentation

## 2015-05-23 DIAGNOSIS — I83222 Varicose veins of left lower extremity with both ulcer of calf and inflammation: Secondary | ICD-10-CM | POA: Insufficient documentation

## 2015-05-23 DIAGNOSIS — I10 Essential (primary) hypertension: Secondary | ICD-10-CM | POA: Insufficient documentation

## 2015-05-23 DIAGNOSIS — L97221 Non-pressure chronic ulcer of left calf limited to breakdown of skin: Secondary | ICD-10-CM | POA: Insufficient documentation

## 2015-05-23 DIAGNOSIS — Z86718 Personal history of other venous thrombosis and embolism: Secondary | ICD-10-CM | POA: Insufficient documentation

## 2015-05-26 ENCOUNTER — Encounter: Payer: Self-pay | Admitting: Internal Medicine

## 2015-05-26 ENCOUNTER — Ambulatory Visit (INDEPENDENT_AMBULATORY_CARE_PROVIDER_SITE_OTHER): Payer: Medicare Other | Admitting: Internal Medicine

## 2015-05-26 VITALS — BP 160/88 | HR 98 | Temp 98.3°F | Resp 18 | Wt 181.0 lb

## 2015-05-26 DIAGNOSIS — M4692 Unspecified inflammatory spondylopathy, cervical region: Secondary | ICD-10-CM

## 2015-05-26 DIAGNOSIS — D6859 Other primary thrombophilia: Secondary | ICD-10-CM

## 2015-05-26 DIAGNOSIS — I2699 Other pulmonary embolism without acute cor pulmonale: Secondary | ICD-10-CM

## 2015-05-26 DIAGNOSIS — J410 Simple chronic bronchitis: Secondary | ICD-10-CM

## 2015-05-26 DIAGNOSIS — L97929 Non-pressure chronic ulcer of unspecified part of left lower leg with unspecified severity: Secondary | ICD-10-CM

## 2015-05-26 DIAGNOSIS — M5412 Radiculopathy, cervical region: Secondary | ICD-10-CM

## 2015-05-26 DIAGNOSIS — M4722 Other spondylosis with radiculopathy, cervical region: Secondary | ICD-10-CM

## 2015-05-26 DIAGNOSIS — I82403 Acute embolism and thrombosis of unspecified deep veins of lower extremity, bilateral: Secondary | ICD-10-CM

## 2015-05-26 DIAGNOSIS — M4682 Other specified inflammatory spondylopathies, cervical region: Secondary | ICD-10-CM

## 2015-05-26 DIAGNOSIS — I10 Essential (primary) hypertension: Secondary | ICD-10-CM

## 2015-05-26 DIAGNOSIS — D6852 Prothrombin gene mutation: Secondary | ICD-10-CM

## 2015-05-26 MED ORDER — RIVAROXABAN 20 MG PO TABS: 20.0000 mg | ORAL_TABLET | Freq: Every day | ORAL | Status: DC

## 2015-05-26 MED ORDER — OXYCODONE HCL 15 MG PO TABS: 15.0000 mg | ORAL_TABLET | ORAL | Status: DC | PRN

## 2015-05-26 MED ORDER — UMECLIDINIUM BROMIDE 62.5 MCG/INH IN AEPB: 1.0000 | INHALATION_SPRAY | Freq: Every day | RESPIRATORY_TRACT | Status: DC

## 2015-05-26 MED ORDER — IRBESARTAN 300 MG PO TABS: 300.0000 mg | ORAL_TABLET | Freq: Every day | ORAL | Status: DC

## 2015-05-26 NOTE — Progress Notes (Signed)
Pre visit review using our clinic review tool, if applicable. No additional management support is needed unless otherwise documented below in the visit note. 

## 2015-05-26 NOTE — Patient Instructions (Signed)

## 2015-05-26 NOTE — Progress Notes (Signed)
Subjective:  Patient ID: Brooke Garcia, female    DOB: [birth year]  Age: 59 y.o. MRN: [id number]  CC: Hypertension and COPD   HPI [name] presents for follow-up. When I last saw her she was not adequately anticoagulated and was experiencing some right-sided chest pain. She was referred to the emergency room where she was not found to have any additional pulmonary emboli. Since being discharged from the emergency room she has been taking Xarelto as was recommended. She is currently taking 15 mg twice a day and soon will advance to 20 mg once a day. She complains of pain today. When she was in the hospital she was prescribed hydromorphone. She feels like oxycodone is a better analgesic for her and she wants to switch back to that. She is also here for blood pressure check. Her blood pressure has not been well controlled at home. She also has ongoing symptoms of COPD. She has a mild nonproductive cough and shortness of breath that affects her mostly at night when she is sleeping. She felt like her previous inhalers which were Spiriva and Advair Diskus cause more symptoms and didn't help her very much. Overall today she feels like she is getting better than she has been over the last month.  Outpatient Prescriptions Prior to Visit  Medication Sig Dispense Refill  . ALPRAZolam (XANAX) 1 MG tablet Take 1 tablet (1 mg total) by mouth 2 (two) times daily as needed for anxiety. 60 tablet 3  . amLODipine (NORVASC) 10 MG tablet TAKE 1 TABLET BY MOUTH ONCE DAILY 90 tablet 2  . mirtazapine (REMERON) 30 MG tablet TAKE 1 TABLET BY MOUTH EVERY NIGHT AT BEDTIME 90 tablet 3  . nicotine (NICODERM CQ - DOSED IN MG/24 HOURS) 21 mg/24hr patch Place 1 patch (21 mg total) onto the skin daily. 28 patch 0  . pantoprazole (PROTONIX) 40 MG tablet TAKE 1 TABLET BY MOUTH DAILY (Patient taking differently: TAKE 40 MG BY MOUTH DAILY) 90 tablet 0  . SANTYL ointment Apply 1 application topically daily.     . celecoxib  (CELEBREX) 200 MG capsule TAKE ONE CAPSULE BY MOUTH TWICE DAILY (Patient taking differently: TAKE 200 MG BY MOUTH ONCE DAILY) 180 capsule 0  . diphenoxylate-atropine (LOMOTIL) 2.5-0.025 MG per tablet Take 1 tablet by mouth 4 (four) times daily as needed for diarrhea or loose stools. (Patient not taking: Reported on 05/16/2015) 120 tablet 0  . fluticasone-salmeterol (ADVAIR HFA) 230-21 MCG/ACT inhaler Inhale 1 puff into the lungs at bedtime. (Patient taking differently: Inhale 1 puff into the lungs daily as needed (shortness of breath, wheezing). ) 1 Inhaler 12  . HYDROmorphone (DILAUDID) 4 MG tablet Take 1 tablet (4 mg total) by mouth every 6 (six) hours as needed for severe pain. 30 tablet 0  . levofloxacin (LEVAQUIN) 500 MG tablet Take 1 tablet (500 mg total) by mouth daily. (Patient not taking: Reported on 05/16/2015) 2 tablet 0  . Rivaroxaban (XARELTO STARTER PACK) 15 & 20 MG TBPK Take as directed on package: Start with one 15mg  tablet by mouth twice a day with food. On Day 22, switch to one 20mg  tablet once a day with food. 51 each 0  . terbinafine (LAMISIL) 250 MG tablet Take 1 tablet (250 mg total) by mouth daily. (Patient not taking: Reported on 05/16/2015) 30 tablet 0  . tiotropium (SPIRIVA HANDIHALER) 18 MCG inhalation capsule Place 1 capsule (18 mcg total) into inhaler and inhale daily. (Patient not taking: Reported on 05/16/2015) 30  capsule 12  . warfarin (COUMADIN) 5 MG tablet Take 5-7.5 mg by mouth daily. Take 5 mg (1 tablet) on Sun, Tues, Thurs, Sat and Take 7.5 mg (1.5 tablet) on Mon, Wed, Fri     No facility-administered medications prior to visit.    ROS Review of Systems  Constitutional: Positive for fatigue. Negative for fever, chills, diaphoresis, appetite change and unexpected weight change.  HENT: Negative.   Eyes: Negative.   Respiratory: Positive for cough and shortness of breath. Negative for apnea, choking, chest tightness, wheezing and stridor.   Cardiovascular: Negative.   Negative for chest pain, palpitations and leg swelling.  Gastrointestinal: Negative.  Negative for nausea, vomiting, abdominal pain, diarrhea and constipation.  Endocrine: Negative.   Genitourinary: Negative.  Negative for hematuria.  Musculoskeletal: Positive for arthralgias and neck pain. Negative for myalgias, back pain, joint swelling and neck stiffness.       She has diffuse pain over her left lower extremity at the location of her venous stasis ulcer.  Skin: Negative.  Negative for color change, pallor and rash.  Allergic/Immunologic: Negative.   Neurological: Negative.   Hematological: Negative.  Negative for adenopathy. Does not bruise/bleed easily.  Psychiatric/Behavioral: Positive for sleep disturbance. Negative for suicidal ideas, confusion, self-injury, dysphoric mood, decreased concentration and agitation. The patient is nervous/anxious. The patient is not hyperactive.     Objective:  BP 160/88 mmHg  Pulse 98  Temp(Src) 98.3 F (36.8 C) (Oral)  Resp 18  Wt 181 lb (82.101 kg)  SpO2 97%  BP Readings from Last 3 Encounters:  05/26/15 160/88  05/16/15 139/61  05/16/15 150/90    Wt Readings from Last 3 Encounters:  05/26/15 181 lb (82.101 kg)  05/16/15 180 lb (81.647 kg)  05/10/15 183 lb (83.008 kg)    Physical Exam  Constitutional: She is oriented to person, place, and time.  Non-toxic appearance. She has a sickly appearance. She does not appear ill. No distress.  HENT:  Head: Normocephalic and atraumatic.  Mouth/Throat: Oropharynx is clear and moist. No oropharyngeal exudate.  Eyes: Conjunctivae are normal. Right eye exhibits no discharge. Left eye exhibits no discharge. No scleral icterus.  Neck: Normal range of motion. Neck supple. No JVD present. No tracheal deviation present. No thyromegaly present.  Cardiovascular: Normal rate, regular rhythm, normal heart sounds and intact distal pulses.  Exam reveals no gallop and no friction rub.   No murmur  heard. Pulmonary/Chest: Effort normal and breath sounds normal. No accessory muscle usage or stridor. No tachypnea. No respiratory distress. She has no decreased breath sounds. She has no wheezes. She has no rhonchi. She has no rales. She exhibits no tenderness.  Abdominal: Soft. Bowel sounds are normal. She exhibits no distension and no mass. There is no tenderness. There is no rebound and no guarding.  Musculoskeletal: Normal range of motion. She exhibits edema (1+ pitting edema in BLE). She exhibits no tenderness.  There is a dressing covering her left lower extremity  Lymphadenopathy:    She has no cervical adenopathy.  Neurological: She is oriented to person, place, and time.  Skin: Skin is warm and dry. No rash noted. She is not diaphoretic. No erythema. No pallor.  Vitals reviewed.   Lab Results  Component Value Date   WBC 9.5 05/16/2015   HGB 13.4 05/16/2015   HCT 39.1 05/16/2015   PLT 412* 05/16/2015   GLUCOSE 120* 05/16/2015   CHOL 170 01/09/2015   TRIG 130.0 01/09/2015   HDL 43.20 01/09/2015   LDLCALC  101* 01/09/2015   ALT 9 01/09/2015   AST 17 01/09/2015   NA 137 05/16/2015   K 3.8 05/16/2015   CL 99* 05/16/2015   CREATININE 0.74 05/16/2015   BUN 7 05/16/2015   CO2 27 05/16/2015   TSH 1.02 01/09/2015   INR 1.70* 05/16/2015    Dg Chest 2 View  05/16/2015   CLINICAL DATA:  Patient with shortness of breath and right-sided chest pain.  EXAM: CHEST  2 VIEW  COMPARISON:  Chest radiograph 05/10/2015  FINDINGS: Monitoring leads overlie the patient. Stable cardiac and mediastinal contours. Tortuosity of the thoracic aorta. Improving linear opacity right lung base. Remainder of the lungs are clear. Small right pleural effusion. Regional skeleton is unremarkable.  IMPRESSION: Improving focal opacity right lung base may represent atelectasis, infection and/or infarct.   Electronically Signed   By: Lovey Newcomer M.D.   On: 05/16/2015 17:15   Ct Angio Chest Pe W/cm &/or Wo  Cm  05/16/2015   CLINICAL DATA:  Patient was just discharged with PEs and DVTs. Shortness of breath for 2 weeks. Right chest pain started yesterday.  EXAM: CT ANGIOGRAPHY CHEST WITH CONTRAST  TECHNIQUE: Multidetector CT imaging of the chest was performed using the standard protocol during bolus administration of intravenous contrast. Multiplanar CT image reconstructions and MIPs were obtained to evaluate the vascular anatomy.  CONTRAST:  12mL OMNIPAQUE IOHEXOL 350 MG/ML SOLN  COMPARISON:  05/16/2015 chest x-ray and CT of the chest 05/08/2015  FINDINGS: Heart:  Right heart strain, indicated by RV LV ratio of 1.1.  Vascular structures: The pulmonary arteries are well opacified by contrast bolus. Persistent but less discrete thrombus identified within the lower lobe branches of the pulmonary arteries bilaterally. No new emboli are identified. Ascending aorta is 4.0 cm.  Mediastinum/thyroid: The visualized portion of the thyroid gland has a normal appearance. No mediastinal, hilar, or axillary adenopathy.  Lungs/Airways: There has been development of numerous nodular areas of infiltrate involving the left upper lobe, likely infectious. The appearance is not typical for infarct. Bilateral atelectasis is also new since prior study.  Upper abdomen: Unremarkable.  Chest wall/osseous structures: Degenerative changes are seen in the spine.  Review of the MIP images confirms the above findings.  IMPRESSION: 1. Persistent but less discrete thrombus within the lower lobe pulmonary arteries bilaterally. 2. No new emboli identified. 3. Persistent right heart strain. 4. Interval development of nodular infiltrate within the left upper lobe, likely infectious and atypical for infarct. 5. Ascending thoracic aorta is 4.0 cm. Recommend annual imaging followup by CTA or MRA. This recommendation follows 2010 ACCF/AHA/AATS/ACR/ASA/SCA/SCAI/SIR/STS/SVM Guidelines for the Diagnosis and Management of Patients with Thoracic Aortic Disease.  Circulation. 2010; 121: [id number]   Electronically Signed   By: Nolon Nations M.D.   On: 05/16/2015 18:24    Assessment & Plan:   Sheyann was seen today for hypertension and copd.  Diagnoses and all orders for this visit:  HYPERTENSION, BENIGN ESSENTIAL- her blood pressure is not adequately well controlled, I will add an ARB to the calcium channel blocker. -     irbesartan (AVAPRO) 300 MG tablet; Take 1 tablet (300 mg total) by mouth daily.  Simple chronic bronchitis- she will discontinue Spiriva and Advair Diskus and she will start using incruse -     Umeclidinium Bromide (INCRUSE ELLIPTA) 62.5 MCG/INH AEPB; Inhale 1 puff into the lungs daily.  Cervical spondylitis with radiculitis -     oxyCODONE (ROXICODONE) 15 MG immediate release tablet; Take 1 tablet (15 mg  total) by mouth every 4 (four) hours as needed for pain.  Leg ulcer, left, with unspecified severity- she will continue wound care with the wound care center -     oxyCODONE (ROXICODONE) 15 MG immediate release tablet; Take 1 tablet (15 mg total) by mouth every 4 (four) hours as needed for pain.  DVT of lower extremity, bilateral -     rivaroxaban (XARELTO) 20 MG TABS tablet; Take 1 tablet (20 mg total) by mouth daily with supper.  Pulmonary emboli- she will continue Xarelto 15 mg twice a day to complete a 3 week course and then she will advance to 20 mg once a day -     rivaroxaban (XARELTO) 20 MG TABS tablet; Take 1 tablet (20 mg total) by mouth daily with supper.  HYPERCOAGULABLE STATE, PRIMARY -     rivaroxaban (XARELTO) 20 MG TABS tablet; Take 1 tablet (20 mg total) by mouth daily with supper.   I have discontinued Ms. Temkin's tiotropium, fluticasone-salmeterol, diphenoxylate-atropine, celecoxib, terbinafine, HYDROmorphone, levofloxacin, Rivaroxaban, and warfarin. I am also having her start on rivaroxaban, oxyCODONE, irbesartan, and Umeclidinium Bromide. Additionally, I am having her maintain her pantoprazole,  amLODipine, nicotine, SANTYL, ALPRAZolam, and mirtazapine.  Meds ordered this encounter  Medications  . rivaroxaban (XARELTO) 20 MG TABS tablet    Sig: Take 1 tablet (20 mg total) by mouth daily with supper.    Dispense:  30 tablet    Refill:  5  . oxyCODONE (ROXICODONE) 15 MG immediate release tablet    Sig: Take 1 tablet (15 mg total) by mouth every 4 (four) hours as needed for pain.    Dispense:  100 tablet    Refill:  0  . irbesartan (AVAPRO) 300 MG tablet    Sig: Take 1 tablet (300 mg total) by mouth daily.    Dispense:  90 tablet    Refill:  1  . Umeclidinium Bromide (INCRUSE ELLIPTA) 62.5 MCG/INH AEPB    Sig: Inhale 1 puff into the lungs daily.    Dispense:  30 each    Refill:  11     Follow-up: Return in about 6 weeks (around 07/07/2015).  Scarlette Calico, MD

## 2015-05-29 DIAGNOSIS — L97221 Non-pressure chronic ulcer of left calf limited to breakdown of skin: Secondary | ICD-10-CM | POA: Diagnosis not present

## 2015-05-29 DIAGNOSIS — Z7901 Long term (current) use of anticoagulants: Secondary | ICD-10-CM | POA: Diagnosis not present

## 2015-05-29 DIAGNOSIS — Z86718 Personal history of other venous thrombosis and embolism: Secondary | ICD-10-CM | POA: Diagnosis not present

## 2015-05-29 DIAGNOSIS — I739 Peripheral vascular disease, unspecified: Secondary | ICD-10-CM | POA: Diagnosis not present

## 2015-05-29 DIAGNOSIS — I10 Essential (primary) hypertension: Secondary | ICD-10-CM | POA: Diagnosis not present

## 2015-05-29 DIAGNOSIS — I83222 Varicose veins of left lower extremity with both ulcer of calf and inflammation: Secondary | ICD-10-CM | POA: Diagnosis not present

## 2015-06-04 ENCOUNTER — Telehealth: Payer: Self-pay

## 2015-06-04 NOTE — Telephone Encounter (Signed)
Call to educate on AWV; Medicare on 06/11/14 so AWV has to be in October if she agrees for fup apt.  Left message to call back.

## 2015-06-12 DIAGNOSIS — I83222 Varicose veins of left lower extremity with both ulcer of calf and inflammation: Secondary | ICD-10-CM | POA: Diagnosis not present

## 2015-06-12 DIAGNOSIS — L97221 Non-pressure chronic ulcer of left calf limited to breakdown of skin: Secondary | ICD-10-CM | POA: Diagnosis not present

## 2015-06-12 DIAGNOSIS — I10 Essential (primary) hypertension: Secondary | ICD-10-CM | POA: Diagnosis not present

## 2015-06-12 DIAGNOSIS — I739 Peripheral vascular disease, unspecified: Secondary | ICD-10-CM | POA: Diagnosis not present

## 2015-06-12 DIAGNOSIS — Z86718 Personal history of other venous thrombosis and embolism: Secondary | ICD-10-CM | POA: Diagnosis not present

## 2015-06-12 DIAGNOSIS — Z7901 Long term (current) use of anticoagulants: Secondary | ICD-10-CM | POA: Diagnosis not present

## 2015-06-18 ENCOUNTER — Telehealth: Payer: Self-pay | Admitting: *Deleted

## 2015-06-18 NOTE — Telephone Encounter (Signed)
Left msg on triage requesting refill on pain med oxycodone. MD is not in the office pls advise....[name]

## 2015-06-19 ENCOUNTER — Encounter (HOSPITAL_BASED_OUTPATIENT_CLINIC_OR_DEPARTMENT_OTHER): Payer: Medicare Other | Attending: Internal Medicine

## 2015-06-19 ENCOUNTER — Other Ambulatory Visit: Payer: Self-pay

## 2015-06-19 DIAGNOSIS — I10 Essential (primary) hypertension: Secondary | ICD-10-CM | POA: Diagnosis not present

## 2015-06-19 DIAGNOSIS — Z86718 Personal history of other venous thrombosis and embolism: Secondary | ICD-10-CM | POA: Diagnosis not present

## 2015-06-19 DIAGNOSIS — L97821 Non-pressure chronic ulcer of other part of left lower leg limited to breakdown of skin: Secondary | ICD-10-CM | POA: Diagnosis not present

## 2015-06-19 DIAGNOSIS — R609 Edema, unspecified: Secondary | ICD-10-CM | POA: Insufficient documentation

## 2015-06-19 DIAGNOSIS — L97929 Non-pressure chronic ulcer of unspecified part of left lower leg with unspecified severity: Secondary | ICD-10-CM

## 2015-06-19 DIAGNOSIS — M5412 Radiculopathy, cervical region: Principal | ICD-10-CM

## 2015-06-19 DIAGNOSIS — M4692 Unspecified inflammatory spondylopathy, cervical region: Secondary | ICD-10-CM

## 2015-06-19 DIAGNOSIS — I872 Venous insufficiency (chronic) (peripheral): Secondary | ICD-10-CM | POA: Diagnosis not present

## 2015-06-19 DIAGNOSIS — I739 Peripheral vascular disease, unspecified: Secondary | ICD-10-CM | POA: Diagnosis not present

## 2015-06-19 MED ORDER — OXYCODONE HCL 15 MG PO TABS: 15.0000 mg | ORAL_TABLET | ORAL | Status: DC | PRN

## 2015-06-19 NOTE — Telephone Encounter (Signed)
MD has printed rx but did not sign will have md to sign tomorrow...Brooke Garcia

## 2015-06-19 NOTE — Telephone Encounter (Signed)
Pls advise on msg below.../lmb 

## 2015-06-19 NOTE — Telephone Encounter (Signed)
Patient is checking on script.  She is currently across the street and would like to pick up.

## 2015-06-19 NOTE — Telephone Encounter (Signed)
OK to fill this prescription with additional refills x0 OV w/Dr TJ in 1 mo Thank you!

## 2015-06-20 NOTE — Telephone Encounter (Signed)
Called pt no answer LMOm rx ready for pick-up. Place in cabinet...Brooke Garcia

## 2015-07-03 ENCOUNTER — Ambulatory Visit: Payer: Medicare Other | Admitting: Internal Medicine

## 2015-07-03 DIAGNOSIS — I739 Peripheral vascular disease, unspecified: Secondary | ICD-10-CM | POA: Diagnosis not present

## 2015-07-03 DIAGNOSIS — R609 Edema, unspecified: Secondary | ICD-10-CM | POA: Diagnosis not present

## 2015-07-03 DIAGNOSIS — Z86718 Personal history of other venous thrombosis and embolism: Secondary | ICD-10-CM | POA: Diagnosis not present

## 2015-07-03 DIAGNOSIS — I10 Essential (primary) hypertension: Secondary | ICD-10-CM | POA: Diagnosis not present

## 2015-07-03 DIAGNOSIS — I872 Venous insufficiency (chronic) (peripheral): Secondary | ICD-10-CM | POA: Diagnosis not present

## 2015-07-03 DIAGNOSIS — L97821 Non-pressure chronic ulcer of other part of left lower leg limited to breakdown of skin: Secondary | ICD-10-CM | POA: Diagnosis not present

## 2015-07-07 ENCOUNTER — Ambulatory Visit (INDEPENDENT_AMBULATORY_CARE_PROVIDER_SITE_OTHER): Payer: Medicare Other | Admitting: Internal Medicine

## 2015-07-07 ENCOUNTER — Encounter: Payer: Self-pay | Admitting: Internal Medicine

## 2015-07-07 VITALS — BP 142/90 | HR 75 | Temp 97.9°F | Resp 16 | Ht 63.0 in | Wt 189.2 lb

## 2015-07-07 DIAGNOSIS — L600 Ingrowing nail: Secondary | ICD-10-CM | POA: Insufficient documentation

## 2015-07-07 DIAGNOSIS — I10 Essential (primary) hypertension: Secondary | ICD-10-CM | POA: Diagnosis not present

## 2015-07-07 DIAGNOSIS — I83029 Varicose veins of left lower extremity with ulcer of unspecified site: Secondary | ICD-10-CM

## 2015-07-07 DIAGNOSIS — M4722 Other spondylosis with radiculopathy, cervical region: Secondary | ICD-10-CM

## 2015-07-07 DIAGNOSIS — M4682 Other specified inflammatory spondylopathies, cervical region: Secondary | ICD-10-CM

## 2015-07-07 DIAGNOSIS — M4692 Unspecified inflammatory spondylopathy, cervical region: Secondary | ICD-10-CM

## 2015-07-07 DIAGNOSIS — L97922 Non-pressure chronic ulcer of unspecified part of left lower leg with fat layer exposed: Secondary | ICD-10-CM

## 2015-07-07 DIAGNOSIS — M5412 Radiculopathy, cervical region: Secondary | ICD-10-CM

## 2015-07-07 DIAGNOSIS — IMO0001 Reserved for inherently not codable concepts without codable children: Secondary | ICD-10-CM

## 2015-07-07 DIAGNOSIS — L97929 Non-pressure chronic ulcer of unspecified part of left lower leg with unspecified severity: Secondary | ICD-10-CM

## 2015-07-07 MED ORDER — OXYCODONE HCL 15 MG PO TABS: 15.0000 mg | ORAL_TABLET | ORAL | Status: DC | PRN

## 2015-07-07 NOTE — Progress Notes (Signed)
Pre visit review using our clinic review tool, if applicable. No additional management support is needed unless otherwise documented below in the visit note. 

## 2015-07-07 NOTE — Patient Instructions (Signed)
Hypertension Hypertension, commonly called high blood pressure, is when the force of blood pumping through your arteries is too strong. Your arteries are the blood vessels that carry blood from your heart throughout your body. A blood pressure reading consists of a higher number over a lower number, such as 110/72. The higher number (systolic) is the pressure inside your arteries when your heart pumps. The lower number (diastolic) is the pressure inside your arteries when your heart relaxes. Ideally you want your blood pressure below 120/80. Hypertension forces your heart to work harder to pump blood. Your arteries may become narrow or stiff. Having untreated or uncontrolled hypertension can cause heart attack, stroke, kidney disease, and other problems. RISK FACTORS Some risk factors for high blood pressure are controllable. Others are not.  Risk factors you cannot control include:   Race. You may be at higher risk if you are African American.  Age. Risk increases with age.  Gender. Men are at higher risk than women before age 45 years. After age 65, women are at higher risk than men. Risk factors you can control include:  Not getting enough exercise or physical activity.  Being overweight.  Getting too much fat, sugar, calories, or salt in your diet.  Drinking too much alcohol. SIGNS AND SYMPTOMS Hypertension does not usually cause signs or symptoms. Extremely high blood pressure (hypertensive crisis) may cause headache, anxiety, shortness of breath, and nosebleed. DIAGNOSIS To check if you have hypertension, your health care provider will measure your blood pressure while you are seated, with your arm held at the level of your heart. It should be measured at least twice using the same arm. Certain conditions can cause a difference in blood pressure between your right and left arms. A blood pressure reading that is higher than normal on one occasion does not mean that you need treatment. If  it is not clear whether you have high blood pressure, you may be asked to return on a different day to have your blood pressure checked again. Or, you may be asked to monitor your blood pressure at home for 1 or more weeks. TREATMENT Treating high blood pressure includes making lifestyle changes and possibly taking medicine. Living a healthy lifestyle can help lower high blood pressure. You may need to change some of your habits. Lifestyle changes may include:  Following the DASH diet. This diet is high in fruits, vegetables, and whole grains. It is low in salt, red meat, and added sugars.  Keep your sodium intake below 2,300 mg per day.  Getting at least 30-45 minutes of aerobic exercise at least 4 times per week.  Losing weight if necessary.  Not smoking.  Limiting alcoholic beverages.  Learning ways to reduce stress. Your health care provider may prescribe medicine if lifestyle changes are not enough to get your blood pressure under control, and if one of the following is true:  You are 18-59 years of age and your systolic blood pressure is above 140.  You are 60 years of age or older, and your systolic blood pressure is above 150.  Your diastolic blood pressure is above 90.  You have diabetes, and your systolic blood pressure is over 140 or your diastolic blood pressure is over 90.  You have kidney disease and your blood pressure is above 140/90.  You have heart disease and your blood pressure is above 140/90. Your personal target blood pressure may vary depending on your medical conditions, your age, and other factors. HOME CARE INSTRUCTIONS    Have your blood pressure rechecked as directed by your health care provider.   Take medicines only as directed by your health care provider. Follow the directions carefully. Blood pressure medicines must be taken as prescribed. The medicine does not work as well when you skip doses. Skipping doses also puts you at risk for  problems.  Do not smoke.   Monitor your blood pressure at home as directed by your health care provider. SEEK MEDICAL CARE IF:   You think you are having a reaction to medicines taken.  You have recurrent headaches or feel dizzy.  You have swelling in your ankles.  You have trouble with your vision. SEEK IMMEDIATE MEDICAL CARE IF:  You develop a severe headache or confusion.  You have unusual weakness, numbness, or feel faint.  You have severe chest or abdominal pain.  You vomit repeatedly.  You have trouble breathing. MAKE SURE YOU:   Understand these instructions.  Will watch your condition.  Will get help right away if you are not doing well or get worse.   This information is not intended to replace advice given to you by your health care provider. Make sure you discuss any questions you have with your health care provider.   Document Released: 08/30/2005 Document Revised: 01/14/2015 Document Reviewed: 06/22/2013 Elsevier Interactive Patient Education 2016 [employer]  

## 2015-07-07 NOTE — Progress Notes (Signed)
Subjective:  Patient ID: Brooke Garcia, female    DOB: [birth year]  Age: 59 y.o. MRN: [id number]  CC: Hypertension and Neck Pain   HPI [name] presents for worsening right neck pain that radiates into her right upper extremity. She has had neck pain for several years and a previous x-ray showed degenerative degenerative disc disease with neural encroachment. For the last few months the neck pain has worsened and now she has numbness and tingling in her right upper extremity. She also complains that her great toenails being ingrown and wants a referral to a podiatrist.  Outpatient Prescriptions Prior to Visit  Medication Sig Dispense Refill  . ALPRAZolam (XANAX) 1 MG tablet Take 1 tablet (1 mg total) by mouth 2 (two) times daily as needed for anxiety. 60 tablet 3  . irbesartan (AVAPRO) 300 MG tablet Take 1 tablet (300 mg total) by mouth daily. 90 tablet 1  . mirtazapine (REMERON) 30 MG tablet TAKE 1 TABLET BY MOUTH EVERY NIGHT AT BEDTIME 90 tablet 3  . nicotine (NICODERM CQ - DOSED IN MG/24 HOURS) 21 mg/24hr patch Place 1 patch (21 mg total) onto the skin daily. 28 patch 0  . pantoprazole (PROTONIX) 40 MG tablet TAKE 1 TABLET BY MOUTH DAILY (Patient taking differently: TAKE 40 MG BY MOUTH DAILY) 90 tablet 0  . rivaroxaban (XARELTO) 20 MG TABS tablet Take 1 tablet (20 mg total) by mouth daily with supper. 30 tablet 5  . SANTYL ointment Apply 1 application topically daily.     Marland Kitchen Umeclidinium Bromide (INCRUSE ELLIPTA) 62.5 MCG/INH AEPB Inhale 1 puff into the lungs daily. 30 each 11  . amLODipine (NORVASC) 10 MG tablet TAKE 1 TABLET BY MOUTH ONCE DAILY 90 tablet 2  . oxyCODONE (ROXICODONE) 15 MG immediate release tablet Take 1 tablet (15 mg total) by mouth every 4 (four) hours as needed for pain. 100 tablet 0   No facility-administered medications prior to visit.    ROS Review of Systems  Constitutional: Negative for fever, chills, diaphoresis, appetite change and fatigue.  HENT:  Negative.   Eyes: Negative.   Respiratory: Negative.  Negative for cough, choking, chest tightness, shortness of breath and stridor.   Cardiovascular: Negative.  Negative for chest pain, palpitations and leg swelling.  Gastrointestinal: Negative.  Negative for nausea, vomiting, abdominal pain, diarrhea, constipation and blood in stool.  Endocrine: Negative.   Genitourinary: Negative.   Musculoskeletal: Positive for myalgias, neck pain and neck stiffness. Negative for back pain, joint swelling, arthralgias and gait problem.       Left lower leg pain at the sight of the ulcer  Skin: Negative.  Negative for rash.  Allergic/Immunologic: Negative.   Neurological: Positive for weakness and numbness. Negative for dizziness, tremors, speech difficulty and light-headedness.  Hematological: Negative.  Negative for adenopathy. Does not bruise/bleed easily.  Psychiatric/Behavioral: Negative.     Objective:  BP 142/90 mmHg  Pulse 75  Temp(Src) 97.9 F (36.6 C) (Oral)  Resp 16  Ht 5\' 3"  (1.6 m)  Wt 189 lb 4 oz (85.843 kg)  BMI 33.53 kg/m2  SpO2 96%  BP Readings from Last 3 Encounters:  07/07/15 142/90  05/26/15 160/88  05/16/15 139/61    Wt Readings from Last 3 Encounters:  07/07/15 189 lb 4 oz (85.843 kg)  05/26/15 181 lb (82.101 kg)  05/16/15 180 lb (81.647 kg)    Physical Exam  Constitutional: She appears well-developed and well-nourished. No distress.  HENT:  Head: Normocephalic and atraumatic.  Mouth/Throat: Oropharynx is clear and moist. No oropharyngeal exudate.  Eyes: Conjunctivae are normal. Right eye exhibits no discharge. Left eye exhibits no discharge. No scleral icterus.  Neck: Normal range of motion. Neck supple. No JVD present. No tracheal deviation present. No thyromegaly present.  Cardiovascular: Normal rate, regular rhythm, normal heart sounds and intact distal pulses.  Exam reveals no gallop and no friction rub.   No murmur heard. Pulmonary/Chest: Effort normal  and breath sounds normal. No stridor. No respiratory distress. She has no wheezes. She has no rales. She exhibits no tenderness.  Abdominal: Soft. Bowel sounds are normal. She exhibits no distension and no mass. There is no tenderness. There is no rebound and no guarding.  Musculoskeletal: Normal range of motion. She exhibits no edema or tenderness.       Cervical back: Normal. She exhibits normal range of motion, no tenderness, no bony tenderness, no swelling, no edema, no deformity, no laceration, no pain, no spasm and normal pulse.       Legs: Both great toenails are ingrown but there is no erythema, exudate, tenderness  Lymphadenopathy:    She has no cervical adenopathy.  Neurological: She is alert. She displays no atrophy, no tremor and normal reflexes. No cranial nerve deficit or sensory deficit. She exhibits abnormal muscle tone. She displays a negative Romberg sign. She displays no seizure activity. Coordination normal.  Reflex Scores:      Tricep reflexes are 1+ on the right side and 1+ on the left side.      Bicep reflexes are 1+ on the right side and 1+ on the left side.      Brachioradialis reflexes are 1+ on the right side and 1+ on the left side.      Patellar reflexes are 1+ on the right side and 1+ on the left side.      Achilles reflexes are 1+ on the right side and 1+ on the left side. She has mild weakness in her RUE  Skin: She is not diaphoretic.  Vitals reviewed.   Lab Results  Component Value Date   WBC 9.5 05/16/2015   HGB 13.4 05/16/2015   HCT 39.1 05/16/2015   PLT 412* 05/16/2015   GLUCOSE 120* 05/16/2015   CHOL 170 01/09/2015   TRIG 130.0 01/09/2015   HDL 43.20 01/09/2015   LDLCALC 101* 01/09/2015   ALT 9 01/09/2015   AST 17 01/09/2015   NA 137 05/16/2015   K 3.8 05/16/2015   CL 99* 05/16/2015   CREATININE 0.74 05/16/2015   BUN 7 05/16/2015   CO2 27 05/16/2015   TSH 1.02 01/09/2015   INR 1.70* 05/16/2015    Dg Chest 2 View  05/16/2015  CLINICAL  DATA:  Patient with shortness of breath and right-sided chest pain. EXAM: CHEST  2 VIEW COMPARISON:  Chest radiograph 05/10/2015 FINDINGS: Monitoring leads overlie the patient. Stable cardiac and mediastinal contours. Tortuosity of the thoracic aorta. Improving linear opacity right lung base. Remainder of the lungs are clear. Small right pleural effusion. Regional skeleton is unremarkable. IMPRESSION: Improving focal opacity right lung base may represent atelectasis, infection and/or infarct. Electronically Signed   By: Lovey Newcomer M.D.   On: 05/16/2015 17:15   Ct Angio Chest Pe W/cm &/or Wo Cm  05/16/2015  CLINICAL DATA:  Patient was just discharged with PEs and DVTs. Shortness of breath for 2 weeks. Right chest pain started yesterday. EXAM: CT ANGIOGRAPHY CHEST WITH CONTRAST TECHNIQUE: Multidetector CT imaging of the chest was performed  using the standard protocol during bolus administration of intravenous contrast. Multiplanar CT image reconstructions and MIPs were obtained to evaluate the vascular anatomy. CONTRAST:  133mL OMNIPAQUE IOHEXOL 350 MG/ML SOLN COMPARISON:  05/16/2015 chest x-ray and CT of the chest 05/08/2015 FINDINGS: Heart:  Right heart strain, indicated by RV LV ratio of 1.1. Vascular structures: The pulmonary arteries are well opacified by contrast bolus. Persistent but less discrete thrombus identified within the lower lobe branches of the pulmonary arteries bilaterally. No new emboli are identified. Ascending aorta is 4.0 cm. Mediastinum/thyroid: The visualized portion of the thyroid gland has a normal appearance. No mediastinal, hilar, or axillary adenopathy. Lungs/Airways: There has been development of numerous nodular areas of infiltrate involving the left upper lobe, likely infectious. The appearance is not typical for infarct. Bilateral atelectasis is also new since prior study. Upper abdomen: Unremarkable. Chest wall/osseous structures: Degenerative changes are seen in the spine. Review  of the MIP images confirms the above findings. IMPRESSION: 1. Persistent but less discrete thrombus within the lower lobe pulmonary arteries bilaterally. 2. No new emboli identified. 3. Persistent right heart strain. 4. Interval development of nodular infiltrate within the left upper lobe, likely infectious and atypical for infarct. 5. Ascending thoracic aorta is 4.0 cm. Recommend annual imaging followup by CTA or MRA. This recommendation follows 2010 ACCF/AHA/AATS/ACR/ASA/SCA/SCAI/SIR/STS/SVM Guidelines for the Diagnosis and Management of Patients with Thoracic Aortic Disease. Circulation. 2010; 121: K481-E563 Electronically Signed   By: Nolon Nations M.D.   On: 05/16/2015 18:24    Assessment & Plan:   Tzippy was seen today for hypertension and neck pain.  Diagnoses and all orders for this visit:  HYPERTENSION, BENIGN ESSENTIAL - her BP is well controlled  Leg ulcer, left, with fat layer exposed ([place])- cont with [facility] -     Discontinue: oxyCODONE (ROXICODONE) 15 MG immediate release tablet; Take 1 tablet (15 mg total) by mouth every 4 (four) hours as needed for pain. -     Discontinue: oxyCODONE (ROXICODONE) 15 MG immediate release tablet; Take 1 tablet (15 mg total) by mouth every 4 (four) hours as needed for pain. -     oxyCODONE (ROXICODONE) 15 MG immediate release tablet; Take 1 tablet (15 mg total) by mouth every 4 (four) hours as needed for pain.  Venous stasis ulcer, left ([facility]) -     Discontinue: oxyCODONE (ROXICODONE) 15 MG immediate release tablet; Take 1 tablet (15 mg total) by mouth every 4 (four) hours as needed for pain. -     Discontinue: oxyCODONE (ROXICODONE) 15 MG immediate release tablet; Take 1 tablet (15 mg total) by mouth every 4 (four) hours as needed for pain. -     oxyCODONE (ROXICODONE) 15 MG immediate release tablet; Take 1 tablet (15 mg total) by mouth every 4 (four) hours as needed for pain.  Cervical spondylitis with radiculitis ([place])- her prior films show DDD with  neural encroachment, will get an MRI to see how severe this is, after I see the results then will refer to neurosurgery or pain management, will cont oxycodone -     MR Cervical Spine Wo Contrast; Future -     Discontinue: oxyCODONE (ROXICODONE) 15 MG immediate release tablet; Take 1 tablet (15 mg total) by mouth every 4 (four) hours as needed for pain. -     Discontinue: oxyCODONE (ROXICODONE) 15 MG immediate release tablet; Take 1 tablet (15 mg total) by mouth every 4 (four) hours as needed for pain. -     oxyCODONE (ROXICODONE) 15  MG immediate release tablet; Take 1 tablet (15 mg total) by mouth every 4 (four) hours as needed for pain.  Leg ulcer, left, with unspecified severity ([place]) -     Discontinue: oxyCODONE (ROXICODONE) 15 MG immediate release tablet; Take 1 tablet (15 mg total) by mouth every 4 (four) hours as needed for pain. -     Discontinue: oxyCODONE (ROXICODONE) 15 MG immediate release tablet; Take 1 tablet (15 mg total) by mouth every 4 (four) hours as needed for pain. -     oxyCODONE (ROXICODONE) 15 MG immediate release tablet; Take 1 tablet (15 mg total) by mouth every 4 (four) hours as needed for pain.  Ingrown toenail without infection -     Ambulatory referral to Podiatry  I have discontinued Ms. Archambeau's amLODipine. I am also having her maintain her pantoprazole, nicotine, SANTYL, ALPRAZolam, mirtazapine, rivaroxaban, irbesartan, Umeclidinium Bromide, and oxyCODONE.  Meds ordered this encounter  Medications  . DISCONTD: oxyCODONE (ROXICODONE) 15 MG immediate release tablet    Sig: Take 1 tablet (15 mg total) by mouth every 4 (four) hours as needed for pain.    Dispense:  100 tablet    Refill:  0    Fill on or after 07/07/15  . DISCONTD: oxyCODONE (ROXICODONE) 15 MG immediate release tablet    Sig: Take 1 tablet (15 mg total) by mouth every 4 (four) hours as needed for pain.    Dispense:  100 tablet    Refill:  0    Fill on or after 08/07/15  . oxyCODONE (ROXICODONE)  15 MG immediate release tablet    Sig: Take 1 tablet (15 mg total) by mouth every 4 (four) hours as needed for pain.    Dispense:  100 tablet    Refill:  0    Fill on or after 09/06/15     Follow-up: Return in about 3 months (around 10/07/2015).  Scarlette Calico, MD

## 2015-07-10 DIAGNOSIS — L97821 Non-pressure chronic ulcer of other part of left lower leg limited to breakdown of skin: Secondary | ICD-10-CM | POA: Diagnosis not present

## 2015-07-10 DIAGNOSIS — I739 Peripheral vascular disease, unspecified: Secondary | ICD-10-CM | POA: Diagnosis not present

## 2015-07-10 DIAGNOSIS — Z86718 Personal history of other venous thrombosis and embolism: Secondary | ICD-10-CM | POA: Diagnosis not present

## 2015-07-10 DIAGNOSIS — I10 Essential (primary) hypertension: Secondary | ICD-10-CM | POA: Diagnosis not present

## 2015-07-10 DIAGNOSIS — I872 Venous insufficiency (chronic) (peripheral): Secondary | ICD-10-CM | POA: Diagnosis not present

## 2015-07-10 DIAGNOSIS — R609 Edema, unspecified: Secondary | ICD-10-CM | POA: Diagnosis not present

## 2015-07-17 ENCOUNTER — Encounter (HOSPITAL_BASED_OUTPATIENT_CLINIC_OR_DEPARTMENT_OTHER): Payer: Medicare Other | Attending: Internal Medicine

## 2015-07-17 DIAGNOSIS — F17200 Nicotine dependence, unspecified, uncomplicated: Secondary | ICD-10-CM | POA: Insufficient documentation

## 2015-07-17 DIAGNOSIS — I10 Essential (primary) hypertension: Secondary | ICD-10-CM | POA: Insufficient documentation

## 2015-07-17 DIAGNOSIS — I83222 Varicose veins of left lower extremity with both ulcer of calf and inflammation: Secondary | ICD-10-CM | POA: Diagnosis not present

## 2015-07-17 DIAGNOSIS — Z86718 Personal history of other venous thrombosis and embolism: Secondary | ICD-10-CM | POA: Insufficient documentation

## 2015-07-17 DIAGNOSIS — L97221 Non-pressure chronic ulcer of left calf limited to breakdown of skin: Secondary | ICD-10-CM | POA: Insufficient documentation

## 2015-07-17 DIAGNOSIS — I739 Peripheral vascular disease, unspecified: Secondary | ICD-10-CM | POA: Diagnosis not present

## 2015-07-18 ENCOUNTER — Other Ambulatory Visit: Payer: Self-pay | Admitting: Internal Medicine

## 2015-07-24 DIAGNOSIS — I739 Peripheral vascular disease, unspecified: Secondary | ICD-10-CM | POA: Diagnosis not present

## 2015-07-24 DIAGNOSIS — F17200 Nicotine dependence, unspecified, uncomplicated: Secondary | ICD-10-CM | POA: Diagnosis not present

## 2015-07-24 DIAGNOSIS — Z86718 Personal history of other venous thrombosis and embolism: Secondary | ICD-10-CM | POA: Diagnosis not present

## 2015-07-24 DIAGNOSIS — I10 Essential (primary) hypertension: Secondary | ICD-10-CM | POA: Diagnosis not present

## 2015-07-24 DIAGNOSIS — L97221 Non-pressure chronic ulcer of left calf limited to breakdown of skin: Secondary | ICD-10-CM | POA: Diagnosis not present

## 2015-07-24 DIAGNOSIS — I83222 Varicose veins of left lower extremity with both ulcer of calf and inflammation: Secondary | ICD-10-CM | POA: Diagnosis not present

## 2015-08-04 ENCOUNTER — Other Ambulatory Visit: Payer: Medicare Other

## 2015-08-14 ENCOUNTER — Encounter (HOSPITAL_BASED_OUTPATIENT_CLINIC_OR_DEPARTMENT_OTHER): Payer: Medicare Other | Attending: Internal Medicine

## 2015-08-14 DIAGNOSIS — L97221 Non-pressure chronic ulcer of left calf limited to breakdown of skin: Secondary | ICD-10-CM | POA: Diagnosis not present

## 2015-08-14 DIAGNOSIS — I10 Essential (primary) hypertension: Secondary | ICD-10-CM | POA: Diagnosis not present

## 2015-08-14 DIAGNOSIS — I739 Peripheral vascular disease, unspecified: Secondary | ICD-10-CM | POA: Diagnosis not present

## 2015-08-14 DIAGNOSIS — I83222 Varicose veins of left lower extremity with both ulcer of calf and inflammation: Secondary | ICD-10-CM | POA: Diagnosis not present

## 2015-08-14 DIAGNOSIS — Z86718 Personal history of other venous thrombosis and embolism: Secondary | ICD-10-CM | POA: Insufficient documentation

## 2015-08-18 ENCOUNTER — Ambulatory Visit
Admission: RE | Admit: 2015-08-18 | Discharge: 2015-08-18 | Disposition: A | Discharge status: Home / Self Care | Payer: Medicare Other | Source: Ambulatory Visit | Attending: Internal Medicine | Admitting: Internal Medicine

## 2015-08-18 DIAGNOSIS — M47812 Spondylosis without myelopathy or radiculopathy, cervical region: Secondary | ICD-10-CM | POA: Diagnosis not present

## 2015-08-18 DIAGNOSIS — M4692 Unspecified inflammatory spondylopathy, cervical region: Secondary | ICD-10-CM

## 2015-08-18 DIAGNOSIS — M5412 Radiculopathy, cervical region: Principal | ICD-10-CM

## 2015-08-19 ENCOUNTER — Encounter: Payer: Self-pay | Admitting: Internal Medicine

## 2015-08-22 ENCOUNTER — Encounter: Payer: Self-pay | Admitting: Podiatry

## 2015-08-22 ENCOUNTER — Ambulatory Visit (INDEPENDENT_AMBULATORY_CARE_PROVIDER_SITE_OTHER): Payer: Medicare Other | Admitting: Podiatry

## 2015-08-22 ENCOUNTER — Telehealth: Payer: Self-pay | Admitting: *Deleted

## 2015-08-22 VITALS — BP 176/98 | HR 76 | Resp 12

## 2015-08-22 DIAGNOSIS — Z86718 Personal history of other venous thrombosis and embolism: Secondary | ICD-10-CM | POA: Diagnosis not present

## 2015-08-22 DIAGNOSIS — B351 Tinea unguium: Secondary | ICD-10-CM

## 2015-08-22 DIAGNOSIS — I10 Essential (primary) hypertension: Secondary | ICD-10-CM | POA: Diagnosis not present

## 2015-08-22 DIAGNOSIS — L97221 Non-pressure chronic ulcer of left calf limited to breakdown of skin: Secondary | ICD-10-CM | POA: Diagnosis not present

## 2015-08-22 DIAGNOSIS — I739 Peripheral vascular disease, unspecified: Secondary | ICD-10-CM | POA: Diagnosis not present

## 2015-08-22 DIAGNOSIS — I83222 Varicose veins of left lower extremity with both ulcer of calf and inflammation: Secondary | ICD-10-CM | POA: Diagnosis not present

## 2015-08-22 MED ORDER — CICLOPIROX 8 % EX SOLN: Freq: Every day | CUTANEOUS | Status: DC

## 2015-08-22 NOTE — Progress Notes (Signed)
   Subjective:    Patient ID: Brooke Garcia, female    DOB: Mar 09, 1956, 59 y.o.   MRN: [id number]  HPI  patient presents the office with concerns of thick, painful, discolored toenails. She states that she has tried pills for toenail fungus  And should pursue other treatment. She denies any sign redness or drainage or any swelling. She does see her toes nails the ingrown at times. No other complaints at this time.   Review of Systems  Skin: Positive for color change.       Objective:   Physical Exam General: AAO x3, NAD  Dermatological: Skin is warm, dry and supple bilateral.  Nails are hypertrophic, dystrophic, discolored, brittle 10. There is evidence of incurvation on the nails. There is no swelling erythema or drainage or edema. There is tenderness overlying nails 1-5. There are no open sores, no preulcerative lesions, no rash or signs of infection present.  Vascular: Dorsalis Pedis artery and Posterior Tibial artery pedal pulses are 2/4 bilateral with immedate capillary fill time. Pedal hair growth present.  There is no pain with calf compression, swelling, warmth, erythema.   Neruologic: Grossly intact via light touch bilateral. Vibratory intact via tuning fork bilateral. Protective threshold with Semmes Wienstein monofilament intact to all pedal sites bilateral. Patellar and Achilles deep tendon reflexes 2+ bilateral. No Babinski or clonus noted bilateral.   Musculoskeletal: No gross boney pedal deformities bilateral. No pain, crepitus, or limitation noted with foot and ankle range of motion bilateral. Muscular strength 5/5 in all groups tested bilateral.  Gait: Unassisted, Nonantalgic.       Assessment & Plan:   patient presents for onychodystrophy, likely onychomycosis -Treatment options discussed including all alternatives, risks, and complications -Etiology of symptoms were discussed -After discussion with the patient she would like to proceed with topical treatment.  Prescribed Penlac. Application instructions and side effects discussed. -Nails debrided x 10 without complications/bleeding.  -Follow-up as scheduled or sooner if any problems arise. In the meantime, encouraged to call the office with any questions, concerns, change in symptoms.   Celesta Gentile, DPM

## 2015-08-22 NOTE — Telephone Encounter (Signed)
Pt states the topical for her toenail fungus is not at the pharmacy.  I explained to pt that Penlac had to be called in due to the instructions so long, and I would call in.  Orders to [facility] on [address].

## 2015-08-22 NOTE — Patient Instructions (Signed)

## 2015-08-28 ENCOUNTER — Telehealth: Payer: Self-pay | Admitting: Internal Medicine

## 2015-08-28 ENCOUNTER — Ambulatory Visit (INDEPENDENT_AMBULATORY_CARE_PROVIDER_SITE_OTHER): Payer: Medicare Other | Admitting: Internal Medicine

## 2015-08-28 ENCOUNTER — Encounter: Payer: Self-pay | Admitting: Internal Medicine

## 2015-08-28 VITALS — BP 164/106 | HR 81 | Temp 97.8°F | Resp 16 | Ht 63.0 in | Wt 198.0 lb

## 2015-08-28 DIAGNOSIS — L97922 Non-pressure chronic ulcer of unspecified part of left lower leg with fat layer exposed: Secondary | ICD-10-CM

## 2015-08-28 DIAGNOSIS — L97221 Non-pressure chronic ulcer of left calf limited to breakdown of skin: Secondary | ICD-10-CM | POA: Diagnosis not present

## 2015-08-28 DIAGNOSIS — L97929 Non-pressure chronic ulcer of unspecified part of left lower leg with unspecified severity: Secondary | ICD-10-CM

## 2015-08-28 DIAGNOSIS — I83222 Varicose veins of left lower extremity with both ulcer of calf and inflammation: Secondary | ICD-10-CM | POA: Diagnosis not present

## 2015-08-28 DIAGNOSIS — M4722 Other spondylosis with radiculopathy, cervical region: Secondary | ICD-10-CM

## 2015-08-28 DIAGNOSIS — M4692 Unspecified inflammatory spondylopathy, cervical region: Secondary | ICD-10-CM

## 2015-08-28 DIAGNOSIS — I83029 Varicose veins of left lower extremity with ulcer of unspecified site: Secondary | ICD-10-CM

## 2015-08-28 DIAGNOSIS — I739 Peripheral vascular disease, unspecified: Secondary | ICD-10-CM | POA: Diagnosis not present

## 2015-08-28 DIAGNOSIS — M5412 Radiculopathy, cervical region: Secondary | ICD-10-CM

## 2015-08-28 DIAGNOSIS — I10 Essential (primary) hypertension: Secondary | ICD-10-CM | POA: Diagnosis not present

## 2015-08-28 DIAGNOSIS — F418 Other specified anxiety disorders: Secondary | ICD-10-CM | POA: Diagnosis not present

## 2015-08-28 DIAGNOSIS — IMO0001 Reserved for inherently not codable concepts without codable children: Secondary | ICD-10-CM

## 2015-08-28 DIAGNOSIS — Z86718 Personal history of other venous thrombosis and embolism: Secondary | ICD-10-CM | POA: Diagnosis not present

## 2015-08-28 DIAGNOSIS — M4682 Other specified inflammatory spondylopathies, cervical region: Secondary | ICD-10-CM

## 2015-08-28 MED ORDER — OXYCODONE HCL 15 MG PO TABS: 15.0000 mg | ORAL_TABLET | ORAL | Status: DC | PRN

## 2015-08-28 MED ORDER — ALPRAZOLAM 1 MG PO TABS: 1.0000 mg | ORAL_TABLET | Freq: Two times a day (BID) | ORAL | Status: DC | PRN

## 2015-08-28 MED ORDER — CHLORTHALIDONE 25 MG PO TABS: 25.0000 mg | ORAL_TABLET | Freq: Every day | ORAL | Status: DC

## 2015-08-28 NOTE — Telephone Encounter (Signed)
Pt was just in today and she is wondering if you can send a copy of her MRI results to her.  She also mentioned something a referral to a neurologist.

## 2015-08-28 NOTE — Progress Notes (Signed)
Pre visit review using our clinic review tool, if applicable. No additional management support is needed unless otherwise documented below in the visit note. 

## 2015-08-28 NOTE — Progress Notes (Signed)
Subjective:  Patient ID: Brooke Garcia, female    DOB: 07-22-1956  Age: 59 y.o. MRN: [id number]  CC: Hypertension   HPI Brooke Garcia presents for blood pressure check and follow-up. She complains of mild intermittent dull headaches. She also complains of right-sided neck pain that radiates into her right upper extremity with numbness in her right arm. She recently had an MRI performed of her cervical spine that showed spinal stenosis with some other findings as well.  Outpatient Prescriptions Prior to Visit  Medication Sig Dispense Refill  . ciclopirox (PENLAC) 8 % solution Apply topically at bedtime. Apply over nail and surrounding skin. Apply daily over previous coat. After seven (7) days, may remove with alcohol and continue cycle. 6.6 mL 4  . irbesartan (AVAPRO) 300 MG tablet Take 1 tablet (300 mg total) by mouth daily. 90 tablet 1  . mirtazapine (REMERON) 30 MG tablet TAKE 1 TABLET BY MOUTH EVERY NIGHT AT BEDTIME 90 tablet 3  . pantoprazole (PROTONIX) 40 MG tablet TAKE 1 TABLET BY MOUTH DAILY 90 tablet 3  . rivaroxaban (XARELTO) 20 MG TABS tablet Take 1 tablet (20 mg total) by mouth daily with supper. 30 tablet 5  . SANTYL ointment Apply 1 application topically daily.     Marland Kitchen Umeclidinium Bromide (INCRUSE ELLIPTA) 62.5 MCG/INH AEPB Inhale 1 puff into the lungs daily. 30 each 11  . ALPRAZolam (XANAX) 1 MG tablet Take 1 tablet (1 mg total) by mouth 2 (two) times daily as needed for anxiety. 60 tablet 3  . nicotine (NICODERM CQ - DOSED IN MG/24 HOURS) 21 mg/24hr patch Place 1 patch (21 mg total) onto the skin daily. 28 patch 0  . oxyCODONE (ROXICODONE) 15 MG immediate release tablet Take 1 tablet (15 mg total) by mouth every 4 (four) hours as needed for pain. 100 tablet 0   No facility-administered medications prior to visit.    ROS Review of Systems  Constitutional: Negative.  Negative for fever, chills, diaphoresis, appetite change and fatigue.  HENT: Negative for congestion, sinus  pressure and trouble swallowing.   Eyes: Negative.  Negative for photophobia and visual disturbance.  Respiratory: Negative.  Negative for cough, choking, chest tightness, shortness of breath, wheezing and stridor.   Cardiovascular: Negative.  Negative for chest pain, palpitations and leg swelling.  Gastrointestinal: Negative.  Negative for nausea, vomiting, abdominal pain, diarrhea and constipation.  Genitourinary: Negative.   Musculoskeletal: Positive for arthralgias and neck pain. Negative for back pain.       She has chronic left lower extremity pain at the site of an ulcer  Skin: Negative.  Negative for color change, pallor and rash.  Allergic/Immunologic: Negative.   Neurological: Positive for numbness (rt arm) and headaches. Negative for dizziness, tremors, seizures, syncope, facial asymmetry, weakness and light-headedness.  Hematological: Negative.  Negative for adenopathy. Does not bruise/bleed easily.  Psychiatric/Behavioral: Positive for sleep disturbance. Negative for suicidal ideas, hallucinations, behavioral problems, confusion, self-injury, dysphoric mood, decreased concentration and agitation. The patient is nervous/anxious. The patient is not hyperactive.     Objective:  BP 164/106 mmHg  Pulse 81  Temp(Src) 97.8 F (36.6 C) (Oral)  Resp 16  Ht 5\' 3"  (1.6 m)  Wt 198 lb (89.812 kg)  BMI 35.08 kg/m2  SpO2 96%  BP Readings from Last 3 Encounters:  08/28/15 164/106  08/22/15 176/98  07/07/15 142/90    Wt Readings from Last 3 Encounters:  08/28/15 198 lb (89.812 kg)  07/07/15 189 lb 4 oz (85.843 kg)  05/26/15 181 lb (82.101 kg)    Physical Exam  Constitutional: She is oriented to person, place, and time. No distress.  HENT:  Mouth/Throat: Oropharynx is clear and moist. No oropharyngeal exudate.  Eyes: Conjunctivae are normal. Right eye exhibits no discharge. Left eye exhibits no discharge. No scleral icterus.  Neck: Normal range of motion. Neck supple. No JVD  present. No tracheal deviation present. No thyromegaly present.  Cardiovascular: Normal rate, regular rhythm, normal heart sounds and intact distal pulses.  Exam reveals no gallop and no friction rub.   No murmur heard. Pulmonary/Chest: Effort normal and breath sounds normal. No stridor. No respiratory distress. She has no wheezes. She has no rales. She exhibits no tenderness.  Abdominal: Soft. Bowel sounds are normal. She exhibits no distension and no mass. There is no tenderness. There is no rebound and no guarding.  Musculoskeletal: Normal range of motion. She exhibits no edema or tenderness.  Lymphadenopathy:    She has no cervical adenopathy.  Neurological: She is alert and oriented to person, place, and time. She has normal reflexes. She displays normal reflexes. No cranial nerve deficit. She exhibits normal muscle tone. Coordination normal.  Skin: Skin is warm and dry. No rash noted. She is not diaphoretic. No erythema. No pallor.  Psychiatric: She has a normal mood and affect. Her behavior is normal. Judgment and thought content normal.  Vitals reviewed.   Lab Results  Component Value Date   WBC 9.5 05/16/2015   HGB 13.4 05/16/2015   HCT 39.1 05/16/2015   PLT 412* 05/16/2015   GLUCOSE 120* 05/16/2015   CHOL 170 01/09/2015   TRIG 130.0 01/09/2015   HDL 43.20 01/09/2015   LDLCALC 101* 01/09/2015   ALT 9 01/09/2015   AST 17 01/09/2015   NA 137 05/16/2015   K 3.8 05/16/2015   CL 99* 05/16/2015   CREATININE 0.74 05/16/2015   BUN 7 05/16/2015   CO2 27 05/16/2015   TSH 1.02 01/09/2015   INR 1.70* 05/16/2015    Mr Cervical Spine Wo Contrast  08/18/2015  CLINICAL DATA:  59 year old female with neck and right shoulder pain for the past year. Right arm numbness. No injury. Neck pain worse after recent shoulder surgery. Initial encounter. EXAM: MRI CERVICAL SPINE WITHOUT CONTRAST TECHNIQUE: Multiplanar, multisequence MR imaging of the cervical spine was performed. No intravenous  contrast was administered. COMPARISON:  10/25/2013 plain film exam. No comparison cervical spine MR. FINDINGS: Exam is motion degraded. Mild atrophy posterior fossa structures with prominent mineralization dentate nucleus incidentally noted. Cervical medullary junction unremarkable. Mild motion artifact without focal cord signal abnormality noted. Question mild prominence glottic region which is incompletely assessed. Degenerative changes C1-2 level with slight prominence transverse ligament. C2-3: Mild facet degenerative changes. Minimal right foraminal narrowing. C3-4: Moderate left facet degenerative changes. Moderate left foraminal narrowing. Minimal bulge. C4-5: Prominent facet joint degenerative changes greater on the right. Moderate right-sided and mild left-sided foraminal narrowing. Minimal anterior slip C4. Bulge. Mild narrowing ventral aspect of the thecal sac. C5-6: Mild facet degenerative changes. Mild foraminal narrowing. Bulge Mild narrowing ventral aspect of the thecal sac with minimal cord flattening. C6-7: Broad-based disc osteophyte complex more notable right paracentral position with slight caudal extension. Narrowing ventral aspect of thecal sac greater on right. Minimal right-sided cord flattening. Marked right-sided and mild-to-moderate left-sided foraminal narrowing. C7-T1: Mild facet joint degenerative changes. No significant foraminal narrowing or spinal stenosis. IMPRESSION: Exam is motion degraded. Cervical spondylotic changes most notable C6-7 level. Summary of pertinent findings includes  C6-7 broad-based disc osteophyte complex more notable right paracentral position with slight caudal extension. Narrowing ventral aspect of thecal sac greater on right. Minimal right-sided cord flattening. Marked right-sided and mild-to-moderate left-sided foraminal narrowing. C5-6 bulge with mild narrowing ventral aspect of the thecal sac with minimal cord flattening. Mild foraminal narrowing. C4-5  prominent facet joint degenerative changes greater on the right. Moderate right-sided and mild left-sided foraminal narrowing. Minimal anterior slip C4. Bulge. Mild narrowing ventral aspect of the thecal sac. C3-4 moderate left facet degenerative changes. Moderate left foraminal narrowing. Minimal bulge. Question mild prominence glottic region which is incompletely assessed. Electronically Signed   By: Genia Del M.D.   On: 08/18/2015 21:06    Assessment & Plan:   Brooke Garcia was seen today for hypertension.  Diagnoses and all orders for this visit:  Depression with anxiety -     ALPRAZolam (XANAX) 1 MG tablet; Take 1 tablet (1 mg total) by mouth 2 (two) times daily as needed for anxiety.  Cervical spondylitis with radiculitis ([place])- she will continue oxycodone for relief of her pain, I have asked her to see neurosurgery to consider surgical intervention to treat this. -     Discontinue: oxyCODONE (ROXICODONE) 15 MG immediate release tablet; Take 1 tablet (15 mg total) by mouth every 4 (four) hours as needed for pain. -     Discontinue: oxyCODONE (ROXICODONE) 15 MG immediate release tablet; Take 1 tablet (15 mg total) by mouth every 4 (four) hours as needed for pain. -     oxyCODONE (ROXICODONE) 15 MG immediate release tablet; Take 1 tablet (15 mg total) by mouth every 4 (four) hours as needed for pain.  HYPERTENSION, BENIGN ESSENTIAL- her blood pressure is not well controlled, she will continue taking the ARB and I have added a thiazide diuretic as well. -     chlorthalidone (HYGROTON) 25 MG tablet; Take 1 tablet (25 mg total) by mouth daily.  Leg ulcer, left, with unspecified severity ([place]) -     Discontinue: oxyCODONE (ROXICODONE) 15 MG immediate release tablet; Take 1 tablet (15 mg total) by mouth every 4 (four) hours as needed for pain. -     Discontinue: oxyCODONE (ROXICODONE) 15 MG immediate release tablet; Take 1 tablet (15 mg total) by mouth every 4 (four) hours as needed for pain. -      oxyCODONE (ROXICODONE) 15 MG immediate release tablet; Take 1 tablet (15 mg total) by mouth every 4 (four) hours as needed for pain.  Leg ulcer, left, with fat layer exposed ([place]) -     Discontinue: oxyCODONE (ROXICODONE) 15 MG immediate release tablet; Take 1 tablet (15 mg total) by mouth every 4 (four) hours as needed for pain. -     Discontinue: oxyCODONE (ROXICODONE) 15 MG immediate release tablet; Take 1 tablet (15 mg total) by mouth every 4 (four) hours as needed for pain. -     oxyCODONE (ROXICODONE) 15 MG immediate release tablet; Take 1 tablet (15 mg total) by mouth every 4 (four) hours as needed for pain.  Venous stasis ulcer, left (HCC) -     Discontinue: oxyCODONE (ROXICODONE) 15 MG immediate release tablet; Take 1 tablet (15 mg total) by mouth every 4 (four) hours as needed for pain. -     Discontinue: oxyCODONE (ROXICODONE) 15 MG immediate release tablet; Take 1 tablet (15 mg total) by mouth every 4 (four) hours as needed for pain. -     oxyCODONE (ROXICODONE) 15 MG immediate release tablet; Take 1 tablet (15  mg total) by mouth every 4 (four) hours as needed for pain.   I have discontinued Brooke Garcia's nicotine. I am also having her start on chlorthalidone. Additionally, I am having her maintain her SANTYL, mirtazapine, rivaroxaban, irbesartan, Umeclidinium Bromide, pantoprazole, ciclopirox, oxyCODONE, and ALPRAZolam.  Meds ordered this encounter  Medications  . chlorthalidone (HYGROTON) 25 MG tablet    Sig: Take 1 tablet (25 mg total) by mouth daily.    Dispense:  90 tablet    Refill:  1  . DISCONTD: oxyCODONE (ROXICODONE) 15 MG immediate release tablet    Sig: Take 1 tablet (15 mg total) by mouth every 4 (four) hours as needed for pain.    Dispense:  100 tablet    Refill:  0    Fill on or after 08/28/15  . DISCONTD: oxyCODONE (ROXICODONE) 15 MG immediate release tablet    Sig: Take 1 tablet (15 mg total) by mouth every 4 (four) hours as needed for pain.    Dispense:  100  tablet    Refill:  0    Fill on or after 09/28/15  . oxyCODONE (ROXICODONE) 15 MG immediate release tablet    Sig: Take 1 tablet (15 mg total) by mouth every 4 (four) hours as needed for pain.    Dispense:  100 tablet    Refill:  0    Fill on or after 10/29/15  . ALPRAZolam (XANAX) 1 MG tablet    Sig: Take 1 tablet (1 mg total) by mouth 2 (two) times daily as needed for anxiety.    Dispense:  60 tablet    Refill:  3     Follow-up: Return in about 2 months (around 10/29/2015).  Scarlette Calico, MD

## 2015-08-28 NOTE — Patient Instructions (Signed)
Hypertension Hypertension, commonly called high blood pressure, is when the force of blood pumping through your arteries is too strong. Your arteries are the blood vessels that carry blood from your heart throughout your body. A blood pressure reading consists of a higher number over a lower number, such as 110/72. The higher number (systolic) is the pressure inside your arteries when your heart pumps. The lower number (diastolic) is the pressure inside your arteries when your heart relaxes. Ideally you want your blood pressure below 120/80. Hypertension forces your heart to work harder to pump blood. Your arteries may become narrow or stiff. Having untreated or uncontrolled hypertension can cause heart attack, stroke, kidney disease, and other problems. RISK FACTORS Some risk factors for high blood pressure are controllable. Others are not.  Risk factors you cannot control include:   Race. You may be at higher risk if you are African American.  Age. Risk increases with age.  Gender. Men are at higher risk than women before age 45 years. After age 65, women are at higher risk than men. Risk factors you can control include:  Not getting enough exercise or physical activity.  Being overweight.  Getting too much fat, sugar, calories, or salt in your diet.  Drinking too much alcohol. SIGNS AND SYMPTOMS Hypertension does not usually cause signs or symptoms. Extremely high blood pressure (hypertensive crisis) may cause headache, anxiety, shortness of breath, and nosebleed. DIAGNOSIS To check if you have hypertension, your health care provider will measure your blood pressure while you are seated, with your arm held at the level of your heart. It should be measured at least twice using the same arm. Certain conditions can cause a difference in blood pressure between your right and left arms. A blood pressure reading that is higher than normal on one occasion does not mean that you need treatment. If  it is not clear whether you have high blood pressure, you may be asked to return on a different day to have your blood pressure checked again. Or, you may be asked to monitor your blood pressure at home for 1 or more weeks. TREATMENT Treating high blood pressure includes making lifestyle changes and possibly taking medicine. Living a healthy lifestyle can help lower high blood pressure. You may need to change some of your habits. Lifestyle changes may include:  Following the DASH diet. This diet is high in fruits, vegetables, and whole grains. It is low in salt, red meat, and added sugars.  Keep your sodium intake below 2,300 mg per day.  Getting at least 30-45 minutes of aerobic exercise at least 4 times per week.  Losing weight if necessary.  Not smoking.  Limiting alcoholic beverages.  Learning ways to reduce stress. Your health care provider may prescribe medicine if lifestyle changes are not enough to get your blood pressure under control, and if one of the following is true:  You are 18-59 years of age and your systolic blood pressure is above 140.  You are 60 years of age or older, and your systolic blood pressure is above 150.  Your diastolic blood pressure is above 90.  You have diabetes, and your systolic blood pressure is over 140 or your diastolic blood pressure is over 90.  You have kidney disease and your blood pressure is above 140/90.  You have heart disease and your blood pressure is above 140/90. Your personal target blood pressure may vary depending on your medical conditions, your age, and other factors. HOME CARE INSTRUCTIONS    Have your blood pressure rechecked as directed by your health care provider.   Take medicines only as directed by your health care provider. Follow the directions carefully. Blood pressure medicines must be taken as prescribed. The medicine does not work as well when you skip doses. Skipping doses also puts you at risk for  problems.  Do not smoke.   Monitor your blood pressure at home as directed by your health care provider. SEEK MEDICAL CARE IF:   You think you are having a reaction to medicines taken.  You have recurrent headaches or feel dizzy.  You have swelling in your ankles.  You have trouble with your vision. SEEK IMMEDIATE MEDICAL CARE IF:  You develop a severe headache or confusion.  You have unusual weakness, numbness, or feel faint.  You have severe chest or abdominal pain.  You vomit repeatedly.  You have trouble breathing. MAKE SURE YOU:   Understand these instructions.  Will watch your condition.  Will get help right away if you are not doing well or get worse.   This information is not intended to replace advice given to you by your health care provider. Make sure you discuss any questions you have with your health care provider.   Document Released: 08/30/2005 Document Revised: 01/14/2015 Document Reviewed: 06/22/2013 Elsevier Interactive Patient Education 2016 [employer]  

## 2015-08-29 NOTE — Telephone Encounter (Signed)
LMOVM

## 2015-09-03 ENCOUNTER — Telehealth: Payer: Self-pay | Admitting: *Deleted

## 2015-09-03 NOTE — Telephone Encounter (Signed)
Sent to address on file

## 2015-09-03 NOTE — Telephone Encounter (Signed)
Dr. Jacqualyn Posey states fungal culture 08/22/2015 is + and orders pt to make an appt to discuss treatment.  Informed pt and transferred to schedulers.

## 2015-09-08 ENCOUNTER — Emergency Department (HOSPITAL_COMMUNITY)
Admission: EM | Admit: 2015-09-08 | Discharge: 2015-09-09 | Disposition: A | Discharge status: Home / Self Care | Payer: Medicare Other | Attending: Emergency Medicine | Admitting: Emergency Medicine

## 2015-09-08 ENCOUNTER — Encounter (HOSPITAL_COMMUNITY): Payer: Self-pay | Admitting: Emergency Medicine

## 2015-09-08 DIAGNOSIS — F17210 Nicotine dependence, cigarettes, uncomplicated: Secondary | ICD-10-CM | POA: Insufficient documentation

## 2015-09-08 DIAGNOSIS — Z8639 Personal history of other endocrine, nutritional and metabolic disease: Secondary | ICD-10-CM | POA: Diagnosis not present

## 2015-09-08 DIAGNOSIS — L97829 Non-pressure chronic ulcer of other part of left lower leg with unspecified severity: Secondary | ICD-10-CM | POA: Insufficient documentation

## 2015-09-08 DIAGNOSIS — F329 Major depressive disorder, single episode, unspecified: Secondary | ICD-10-CM | POA: Insufficient documentation

## 2015-09-08 DIAGNOSIS — I10 Essential (primary) hypertension: Secondary | ICD-10-CM | POA: Diagnosis not present

## 2015-09-08 DIAGNOSIS — Z87448 Personal history of other diseases of urinary system: Secondary | ICD-10-CM | POA: Diagnosis not present

## 2015-09-08 DIAGNOSIS — Z8719 Personal history of other diseases of the digestive system: Secondary | ICD-10-CM | POA: Insufficient documentation

## 2015-09-08 DIAGNOSIS — Z86718 Personal history of other venous thrombosis and embolism: Secondary | ICD-10-CM | POA: Diagnosis not present

## 2015-09-08 DIAGNOSIS — Z7901 Long term (current) use of anticoagulants: Secondary | ICD-10-CM | POA: Diagnosis not present

## 2015-09-08 DIAGNOSIS — Z79899 Other long term (current) drug therapy: Secondary | ICD-10-CM | POA: Insufficient documentation

## 2015-09-08 DIAGNOSIS — L03116 Cellulitis of left lower limb: Secondary | ICD-10-CM

## 2015-09-08 DIAGNOSIS — L97229 Non-pressure chronic ulcer of left calf with unspecified severity: Secondary | ICD-10-CM | POA: Diagnosis not present

## 2015-09-08 DIAGNOSIS — Z87442 Personal history of urinary calculi: Secondary | ICD-10-CM | POA: Diagnosis not present

## 2015-09-08 DIAGNOSIS — Z862 Personal history of diseases of the blood and blood-forming organs and certain disorders involving the immune mechanism: Secondary | ICD-10-CM | POA: Diagnosis not present

## 2015-09-08 DIAGNOSIS — B9689 Other specified bacterial agents as the cause of diseases classified elsewhere: Secondary | ICD-10-CM | POA: Diagnosis not present

## 2015-09-08 DIAGNOSIS — M79606 Pain in leg, unspecified: Secondary | ICD-10-CM | POA: Diagnosis present

## 2015-09-08 DIAGNOSIS — J449 Chronic obstructive pulmonary disease, unspecified: Secondary | ICD-10-CM | POA: Insufficient documentation

## 2015-09-08 DIAGNOSIS — I83008 Varicose veins of unspecified lower extremity with ulcer other part of lower leg: Secondary | ICD-10-CM | POA: Diagnosis not present

## 2015-09-08 DIAGNOSIS — F419 Anxiety disorder, unspecified: Secondary | ICD-10-CM | POA: Diagnosis not present

## 2015-09-08 DIAGNOSIS — IMO0001 Reserved for inherently not codable concepts without codable children: Secondary | ICD-10-CM

## 2015-09-08 LAB — CBC
HEMATOCRIT: 39.9 % (ref 36.0–46.0)
Hemoglobin: 13.8 g/dL (ref 12.0–15.0)
MCH: 33.1 pg (ref 26.0–34.0)
MCHC: 34.6 g/dL (ref 30.0–36.0)
MCV: 95.7 fL (ref 78.0–100.0)
Platelets: 260 10*3/uL (ref 150–400)
RBC: 4.17 MIL/uL (ref 3.87–5.11)
RDW: 12.4 % (ref 11.5–15.5)
WBC: 9.5 10*3/uL (ref 4.0–10.5)

## 2015-09-08 LAB — COMPREHENSIVE METABOLIC PANEL
ALT: 23 U/L (ref 14–54)
AST: 43 U/L — ABNORMAL HIGH (ref 15–41)
Albumin: 3.8 g/dL (ref 3.5–5.0)
Alkaline Phosphatase: 121 U/L (ref 38–126)
Anion gap: 12 (ref 5–15)
BUN: <5 mg/dL — ABNORMAL LOW (ref 6–20)
CHLORIDE: 100 mmol/L — AB (ref 101–111)
CO2: 25 mmol/L (ref 22–32)
CREATININE: 0.75 mg/dL (ref 0.44–1.00)
Calcium: 8.8 mg/dL — ABNORMAL LOW (ref 8.9–10.3)
GFR calc non Af Amer: >60 mL/min (ref >60)
Glucose, Bld: 109 mg/dL — ABNORMAL HIGH (ref 65–99)
POTASSIUM: 3.2 mmol/L — AB (ref 3.5–5.1)
SODIUM: 137 mmol/L (ref 135–145)
Total Bilirubin: 1.2 mg/dL (ref 0.3–1.2)
Total Protein: 7.7 g/dL (ref 6.5–8.1)

## 2015-09-08 LAB — I-STAT CG4 LACTIC ACID, ED: Lactic Acid, Venous: 1.16 mmol/L (ref 0.5–2.0)

## 2015-09-08 LAB — D-DIMER, QUANTITATIVE: D-Dimer, Quant: 0.93 ug/mL-FEU — ABNORMAL HIGH (ref 0.00–0.50)

## 2015-09-08 LAB — PROTIME-INR
INR: 1.72 — ABNORMAL HIGH (ref 0.00–1.49)
Prothrombin Time: 20.1 seconds — ABNORMAL HIGH (ref 11.6–15.2)

## 2015-09-08 MED ORDER — PIPERACILLIN-TAZOBACTAM 3.375 G IVPB
3.3750 g | Freq: Once | INTRAVENOUS | Status: AC
  Administered 2015-09-08: 3.375 g via INTRAVENOUS
  Filled 2015-09-08: qty 50

## 2015-09-08 MED ORDER — OXYCODONE HCL 5 MG PO TABS
15.0000 mg | ORAL_TABLET | Freq: Once | ORAL | Status: AC
  Administered 2015-09-08: 15 mg via ORAL
  Filled 2015-09-08: qty 3

## 2015-09-08 MED ORDER — LIDOCAINE HCL 2 % EX GEL
1.0000 "application " | Freq: Once | CUTANEOUS | Status: AC
  Administered 2015-09-08: 1 via TOPICAL
  Filled 2015-09-08: qty 11

## 2015-09-08 MED ORDER — ONDANSETRON HCL 4 MG/2ML IJ SOLN
4.0000 mg | Freq: Once | INTRAMUSCULAR | Status: AC
  Administered 2015-09-08: 4 mg via INTRAVENOUS
  Filled 2015-09-08: qty 2

## 2015-09-08 MED ORDER — FENTANYL CITRATE (PF) 100 MCG/2ML IJ SOLN
50.0000 ug | Freq: Once | INTRAMUSCULAR | Status: AC
  Administered 2015-09-08: 50 ug via INTRAVENOUS
  Filled 2015-09-08: qty 2

## 2015-09-08 MED ORDER — VANCOMYCIN HCL IN DEXTROSE 1-5 GM/200ML-% IV SOLN
1000.0000 mg | Freq: Once | INTRAVENOUS | Status: AC
  Administered 2015-09-08: 1000 mg via INTRAVENOUS
  Filled 2015-09-08: qty 200

## 2015-09-08 NOTE — ED Provider Notes (Signed)
CSN: [id number]     Arrival date & time 09/08/15  1721 History   First MD Initiated Contact with Patient 09/08/15 2101     Chief Complaint  Patient presents with  . Venous Stasis Ulcer  . Leg Pain     (Consider location/radiation/quality/duration/timing/severity/associated sxs/prior Treatment) HPI Patient has a chronic venous stasis ulcer. She reports starting 2 days ago it got much redder and more painful. She reports usually she doesn't have trouble walking on it but now is painful to walk on. She denies fevers or chills. No other associated symptoms. Past Medical History  Diagnosis Date  . Tobacco use disorder   . Unspecified urinary incontinence   . Complete rupture of rotator cuff   . Acute venous embolism and thrombosis of unspecified deep vessels of lower extremity   . Calculus of kidney   . Degeneration of intervertebral disc, site unspecified   . Painful respiration   . Other and unspecified noninfectious gastroenteritis and colitis(558.9)   . HTN (hypertension)   . Family history of ischemic heart disease   . Anxiety state, unspecified   . HLD (hyperlipidemia)   . Primary hypercoagulable state ([place])   . Arthritis   . COPD (chronic obstructive pulmonary disease) ([place])   . Depression   . History of gallstones   . Protein S deficiency ([place])   . Colitis   . Renal failure   . Blood dyscrasia     protein def.  . Venous stasis ulcers (H[facility])     left leg  . DVT (deep venous thrombosis) [facility])    Past Surgical History  Procedure Laterality Date  . Thrombectomy / embolectomy femoral artery      DVT  . Nasal sinus surgery    . Tubal ligation    . Rotator cuff repair      right  . Tubal ligation reversal    . Colonoscopy  04/27/2012    Procedure: COLONOSCOPY;  Surgeon: Jerene Bears, MD;  Location: [facility];  Service: Gastroenterology;  Laterality: N/A;  . Endovenous ablation saphenous vein w/ laser Left 07-04-2014    EVLA left greater saphenous vein by Curt Jews  MD  . Endovenous ablation saphenous vein w/ laser Right 07-17-2014    EVLA roght greater saphenous vein by Curt Jews MD   Family History  Problem Relation Age of Onset  . Breast cancer Mother     mets to liver  . Stroke Mother   . Heart attack Father   . Lung cancer Father   . Other Father     Protein S Deficiency  . Clotting disorder Father   . Diabetes Father   . Heart disease Father   . Cancer Father     Lung  . Hypertension Maternal Grandmother   . Colon cancer Maternal Grandmother   . Heart disease Maternal Grandmother   . Heart attack Paternal Grandmother   . Diabetes Sister   . Stomach cancer Neg Hx   . Alzheimer's disease Maternal Grandfather   . Tuberculosis Paternal Grandmother   . Hypertension Brother   . Ulcers Brother    Social History  Substance Use Topics  . Smoking status: Current Every Day Smoker -- 0.50 packs/day for 30 years    Types: Cigarettes  . Smokeless tobacco: Never Used     Comment: 0.5 packs per day  . Alcohol Use: No   OB History    No data available     Review of Systems 10 Systems reviewed and  are negative for acute change except as noted in the HPI.    Allergies  Butalbital-apap-caffeine; Naproxen; and Tylenol  Home Medications   Prior to Admission medications   Medication Sig Start Date End Date Taking? Authorizing Provider  ALPRAZolam Duanne Moron) 1 MG tablet Take 1 tablet (1 mg total) by mouth 2 (two) times daily as needed for anxiety. 08/28/15  Yes Janith Lima, MD  chlorthalidone (HYGROTON) 25 MG tablet Take 1 tablet (25 mg total) by mouth daily. 08/28/15  Yes Janith Lima, MD  mirtazapine (REMERON) 30 MG tablet TAKE 1 TABLET BY MOUTH EVERY NIGHT AT BEDTIME 05/22/15  Yes Janith Lima, MD  oxyCODONE (ROXICODONE) 15 MG immediate release tablet Take 1 tablet (15 mg total) by mouth every 4 (four) hours as needed for pain. 08/28/15  Yes Janith Lima, MD  pantoprazole (PROTONIX) 40 MG tablet TAKE 1 TABLET BY MOUTH DAILY 07/20/15   Yes Janith Lima, MD  rivaroxaban (XARELTO) 20 MG TABS tablet Take 1 tablet (20 mg total) by mouth daily with supper. 05/26/15  Yes Janith Lima, MD  SANTYL ointment Apply 1 application topically daily.  04/28/15  Yes Historical Provider, MD  Umeclidinium Bromide (INCRUSE ELLIPTA) 62.5 MCG/INH AEPB Inhale 1 puff into the lungs daily. 05/26/15  Yes Janith Lima, MD  amoxicillin-clavulanate (AUGMENTIN) 875-125 MG tablet Take 1 tablet by mouth 2 (two) times daily. One po bid x 7 days 09/09/15   Charlesetta Shanks, MD  ciclopirox [facility]) 8 % solution Apply topically at bedtime. Apply over nail and surrounding skin. Apply daily over previous coat. After seven (7) days, may remove with alcohol and continue cycle. Patient not taking: Reported on 09/08/2015 08/22/15   Trula Slade, DPM  irbesartan (AVAPRO) 300 MG tablet Take 1 tablet (300 mg total) by mouth daily. Patient not taking: Reported on 09/08/2015 05/26/15   Janith Lima, MD  sulfamethoxazole-trimethoprim (BACTRIM DS,SEPTRA DS) 800-160 MG tablet Take 1 tablet by mouth 2 (two) times daily. 09/09/15   Charlesetta Shanks, MD   BP 164/115 mmHg  Pulse 85  Temp(Src) 99.5 F (37.5 C) (Oral)  Resp 20  SpO2 97% Physical Exam  Constitutional: She is oriented to person, place, and time. She appears well-developed and well-nourished.  HENT:  Head: Normocephalic and atraumatic.  Eyes: EOM are normal. Pupils are equal, round, and reactive to light.  Neck: Neck supple.  Cardiovascular: Normal rate, regular rhythm, normal heart sounds and intact distal pulses.   Pulmonary/Chest: Effort normal and breath sounds normal.  Abdominal: Soft. Bowel sounds are normal. She exhibits no distension. There is no tenderness.  Musculoskeletal: Normal range of motion. She exhibits tenderness. She exhibits no edema.  Patient has an eroded stasis ulcer on the posterior aspect of her left lower leg. This has 2 lobes to it. Total size appears to be approximately 11 cm x 5  cm. There is granular tissue in the base. There is erythema surrounding the wound. There is also some circumferential erythema around the leg. Foot has 1+ edema.  Neurological: She is alert and oriented to person, place, and time. She has normal strength. Coordination normal. GCS eye subscore is 4. GCS verbal subscore is 5. GCS motor subscore is 6.  Skin: Skin is warm, dry and intact.  Psychiatric: She has a normal mood and affect.    ED Course  Procedures (including critical care time) Labs Review Labs Reviewed  COMPREHENSIVE METABOLIC PANEL - Abnormal; Notable for the following:    Potassium  3.2 (*)    Chloride 100 (*)    Glucose, Bld 109 (*)    BUN <5 (*)    Calcium 8.8 (*)    AST 43 (*)    All other components within normal limits  D-DIMER, QUANTITATIVE (NOT AT [facility]) - Abnormal; Notable for the following:    D-Dimer, Quant 0.93 (*)    All other components within normal limits  PROTIME-INR - Abnormal; Notable for the following:    Prothrombin Time 20.1 (*)    INR 1.72 (*)    All other components within normal limits  CBC  I-STAT CG4 LACTIC ACID, ED    Imaging Review No results found. I have personally reviewed and evaluated these images and lab results as part of my medical decision-making.   EKG Interpretation None      MDM   Final diagnoses:  Venous stasis ulcer, left (HCC)  Cellulitis of left lower extremity   Patient had an image on her phone of the ulcer previously. In size and appearance ulcer does not appear significantly worse. She does however have surrounding erythema and reports increased pain. The concern was for possible cellulitis in association with her chronic ulcer. The patient was given a dose of vancomycin and Rocephin in the emergency department. She felt that she would be able to follow up with her wound care clinic or her family physician within the next day. She is aware of the need to return if there is any worsening or changing.    Charlesetta Shanks, MD 09/09/15 [phone]

## 2015-09-08 NOTE — ED Notes (Signed)
Pt reports hx of venous ulcers to left leg; complaint of redness/warmth/pain worsening since Friday.

## 2015-09-09 DIAGNOSIS — I83008 Varicose veins of unspecified lower extremity with ulcer other part of lower leg: Secondary | ICD-10-CM | POA: Diagnosis not present

## 2015-09-09 MED ORDER — SULFAMETHOXAZOLE-TRIMETHOPRIM 800-160 MG PO TABS: 1.0000 | ORAL_TABLET | Freq: Two times a day (BID) | ORAL | Status: DC

## 2015-09-09 MED ORDER — AMOXICILLIN-POT CLAVULANATE 875-125 MG PO TABS: 1.0000 | ORAL_TABLET | Freq: Two times a day (BID) | ORAL | Status: DC

## 2015-09-09 NOTE — Discharge Instructions (Signed)
Cellulitis °Cellulitis is an infection of the skin and the tissue under the skin. The infected area is usually red and tender. This happens most often in the arms and lower legs. °HOME CARE  °· Take your antibiotic medicine as told. Finish the medicine even if you start to feel better. °· Keep the infected arm or leg raised (elevated). °· Put a warm cloth on the area up to 4 times per day. °· Only take medicines as told by your doctor. °· Keep all doctor visits as told. °GET HELP IF: °· You see red streaks on the skin coming from the infected area. °· Your red area gets bigger or turns a dark color. °· Your bone or joint under the infected area is painful after the skin heals. °· Your infection comes back in the same area or different area. °· You have a puffy (swollen) bump in the infected area. °· You have new symptoms. °· You have a fever. °GET HELP RIGHT AWAY IF:  °· You feel very sleepy. °· You throw up (vomit) or have watery poop (diarrhea). °· You feel sick and have muscle aches and pains. °  °This information is not intended to replace advice given to you by your health care provider. Make sure you discuss any questions you have with your health care provider. °  °Document Released: 02/16/2008 Document Revised: 05/21/2015 Document Reviewed: 11/15/2011 °Elsevier Interactive Patient Education ©2016 [employer] ° °

## 2015-09-19 ENCOUNTER — Encounter: Payer: Self-pay | Admitting: Podiatry

## 2015-09-22 ENCOUNTER — Ambulatory Visit: Payer: Medicare Other | Admitting: Podiatry

## 2015-09-25 ENCOUNTER — Encounter (HOSPITAL_BASED_OUTPATIENT_CLINIC_OR_DEPARTMENT_OTHER): Payer: Medicare Other | Attending: Internal Medicine

## 2015-09-25 DIAGNOSIS — I739 Peripheral vascular disease, unspecified: Secondary | ICD-10-CM | POA: Insufficient documentation

## 2015-09-25 DIAGNOSIS — I10 Essential (primary) hypertension: Secondary | ICD-10-CM | POA: Insufficient documentation

## 2015-09-25 DIAGNOSIS — Z86718 Personal history of other venous thrombosis and embolism: Secondary | ICD-10-CM | POA: Insufficient documentation

## 2015-09-25 DIAGNOSIS — I83222 Varicose veins of left lower extremity with both ulcer of calf and inflammation: Secondary | ICD-10-CM | POA: Insufficient documentation

## 2015-09-25 DIAGNOSIS — L97221 Non-pressure chronic ulcer of left calf limited to breakdown of skin: Secondary | ICD-10-CM | POA: Insufficient documentation

## 2015-09-30 ENCOUNTER — Ambulatory Visit: Payer: Medicare Other | Admitting: Podiatry

## 2015-10-09 DIAGNOSIS — Z86718 Personal history of other venous thrombosis and embolism: Secondary | ICD-10-CM | POA: Diagnosis not present

## 2015-10-09 DIAGNOSIS — I83222 Varicose veins of left lower extremity with both ulcer of calf and inflammation: Secondary | ICD-10-CM | POA: Diagnosis not present

## 2015-10-09 DIAGNOSIS — L97821 Non-pressure chronic ulcer of other part of left lower leg limited to breakdown of skin: Secondary | ICD-10-CM | POA: Diagnosis not present

## 2015-10-09 DIAGNOSIS — I739 Peripheral vascular disease, unspecified: Secondary | ICD-10-CM | POA: Diagnosis not present

## 2015-10-09 DIAGNOSIS — I10 Essential (primary) hypertension: Secondary | ICD-10-CM | POA: Diagnosis not present

## 2015-10-09 DIAGNOSIS — L97221 Non-pressure chronic ulcer of left calf limited to breakdown of skin: Secondary | ICD-10-CM | POA: Diagnosis not present

## 2015-10-17 ENCOUNTER — Ambulatory Visit: Payer: Medicare Other | Admitting: Podiatry

## 2015-10-17 ENCOUNTER — Encounter (HOSPITAL_BASED_OUTPATIENT_CLINIC_OR_DEPARTMENT_OTHER): Payer: Medicare Other | Attending: Internal Medicine

## 2015-10-17 DIAGNOSIS — I739 Peripheral vascular disease, unspecified: Secondary | ICD-10-CM | POA: Diagnosis not present

## 2015-10-17 DIAGNOSIS — I10 Essential (primary) hypertension: Secondary | ICD-10-CM | POA: Diagnosis not present

## 2015-10-17 DIAGNOSIS — Z86718 Personal history of other venous thrombosis and embolism: Secondary | ICD-10-CM | POA: Insufficient documentation

## 2015-10-17 DIAGNOSIS — L97221 Non-pressure chronic ulcer of left calf limited to breakdown of skin: Secondary | ICD-10-CM | POA: Insufficient documentation

## 2015-10-17 DIAGNOSIS — L97821 Non-pressure chronic ulcer of other part of left lower leg limited to breakdown of skin: Secondary | ICD-10-CM | POA: Diagnosis not present

## 2015-10-17 DIAGNOSIS — I83222 Varicose veins of left lower extremity with both ulcer of calf and inflammation: Secondary | ICD-10-CM | POA: Diagnosis not present

## 2015-10-28 ENCOUNTER — Ambulatory Visit: Payer: Medicare Other | Admitting: Podiatry

## 2015-10-31 DIAGNOSIS — I83222 Varicose veins of left lower extremity with both ulcer of calf and inflammation: Secondary | ICD-10-CM | POA: Diagnosis not present

## 2015-10-31 DIAGNOSIS — L97221 Non-pressure chronic ulcer of left calf limited to breakdown of skin: Secondary | ICD-10-CM | POA: Diagnosis not present

## 2015-10-31 DIAGNOSIS — L97821 Non-pressure chronic ulcer of other part of left lower leg limited to breakdown of skin: Secondary | ICD-10-CM | POA: Diagnosis not present

## 2015-10-31 DIAGNOSIS — I739 Peripheral vascular disease, unspecified: Secondary | ICD-10-CM | POA: Diagnosis not present

## 2015-10-31 DIAGNOSIS — I10 Essential (primary) hypertension: Secondary | ICD-10-CM | POA: Diagnosis not present

## 2015-10-31 DIAGNOSIS — Z86718 Personal history of other venous thrombosis and embolism: Secondary | ICD-10-CM | POA: Diagnosis not present

## 2015-11-07 DIAGNOSIS — I739 Peripheral vascular disease, unspecified: Secondary | ICD-10-CM | POA: Diagnosis not present

## 2015-11-07 DIAGNOSIS — L97221 Non-pressure chronic ulcer of left calf limited to breakdown of skin: Secondary | ICD-10-CM | POA: Diagnosis not present

## 2015-11-07 DIAGNOSIS — Z86718 Personal history of other venous thrombosis and embolism: Secondary | ICD-10-CM | POA: Diagnosis not present

## 2015-11-07 DIAGNOSIS — L97821 Non-pressure chronic ulcer of other part of left lower leg limited to breakdown of skin: Secondary | ICD-10-CM | POA: Diagnosis not present

## 2015-11-07 DIAGNOSIS — I10 Essential (primary) hypertension: Secondary | ICD-10-CM | POA: Diagnosis not present

## 2015-11-07 DIAGNOSIS — I83222 Varicose veins of left lower extremity with both ulcer of calf and inflammation: Secondary | ICD-10-CM | POA: Diagnosis not present

## 2015-11-13 ENCOUNTER — Ambulatory Visit: Payer: Medicare Other | Admitting: Internal Medicine

## 2015-11-13 ENCOUNTER — Encounter (HOSPITAL_BASED_OUTPATIENT_CLINIC_OR_DEPARTMENT_OTHER): Payer: Medicare Other | Attending: Internal Medicine

## 2015-11-13 DIAGNOSIS — Z86718 Personal history of other venous thrombosis and embolism: Secondary | ICD-10-CM | POA: Insufficient documentation

## 2015-11-13 DIAGNOSIS — I83222 Varicose veins of left lower extremity with both ulcer of calf and inflammation: Secondary | ICD-10-CM | POA: Diagnosis not present

## 2015-11-13 DIAGNOSIS — L97221 Non-pressure chronic ulcer of left calf limited to breakdown of skin: Secondary | ICD-10-CM | POA: Insufficient documentation

## 2015-11-13 DIAGNOSIS — L97821 Non-pressure chronic ulcer of other part of left lower leg limited to breakdown of skin: Secondary | ICD-10-CM | POA: Diagnosis not present

## 2015-11-13 DIAGNOSIS — I10 Essential (primary) hypertension: Secondary | ICD-10-CM | POA: Diagnosis not present

## 2015-11-20 DIAGNOSIS — I83222 Varicose veins of left lower extremity with both ulcer of calf and inflammation: Secondary | ICD-10-CM | POA: Diagnosis not present

## 2015-11-20 DIAGNOSIS — L97221 Non-pressure chronic ulcer of left calf limited to breakdown of skin: Secondary | ICD-10-CM | POA: Diagnosis not present

## 2015-11-20 DIAGNOSIS — I10 Essential (primary) hypertension: Secondary | ICD-10-CM | POA: Diagnosis not present

## 2015-11-20 DIAGNOSIS — Z86718 Personal history of other venous thrombosis and embolism: Secondary | ICD-10-CM | POA: Diagnosis not present

## 2015-11-20 DIAGNOSIS — L97821 Non-pressure chronic ulcer of other part of left lower leg limited to breakdown of skin: Secondary | ICD-10-CM | POA: Diagnosis not present

## 2015-11-21 ENCOUNTER — Ambulatory Visit: Payer: Medicare Other | Admitting: Podiatry

## 2015-11-25 ENCOUNTER — Other Ambulatory Visit: Payer: Self-pay | Admitting: Internal Medicine

## 2015-11-25 ENCOUNTER — Encounter: Payer: Self-pay | Admitting: Internal Medicine

## 2015-11-25 ENCOUNTER — Telehealth: Payer: Self-pay

## 2015-11-25 ENCOUNTER — Ambulatory Visit (INDEPENDENT_AMBULATORY_CARE_PROVIDER_SITE_OTHER): Payer: Medicare Other | Admitting: Internal Medicine

## 2015-11-25 VITALS — BP 140/90 | HR 84 | Temp 98.4°F | Resp 16 | Ht 63.0 in | Wt 199.0 lb

## 2015-11-25 DIAGNOSIS — I2699 Other pulmonary embolism without acute cor pulmonale: Secondary | ICD-10-CM

## 2015-11-25 DIAGNOSIS — L97922 Non-pressure chronic ulcer of unspecified part of left lower leg with fat layer exposed: Secondary | ICD-10-CM

## 2015-11-25 DIAGNOSIS — D6859 Other primary thrombophilia: Secondary | ICD-10-CM

## 2015-11-25 DIAGNOSIS — I82403 Acute embolism and thrombosis of unspecified deep veins of lower extremity, bilateral: Secondary | ICD-10-CM

## 2015-11-25 DIAGNOSIS — E785 Hyperlipidemia, unspecified: Secondary | ICD-10-CM

## 2015-11-25 DIAGNOSIS — L97929 Non-pressure chronic ulcer of unspecified part of left lower leg with unspecified severity: Secondary | ICD-10-CM

## 2015-11-25 DIAGNOSIS — I10 Essential (primary) hypertension: Secondary | ICD-10-CM | POA: Diagnosis not present

## 2015-11-25 DIAGNOSIS — I83029 Varicose veins of left lower extremity with ulcer of unspecified site: Secondary | ICD-10-CM

## 2015-11-25 DIAGNOSIS — M4692 Unspecified inflammatory spondylopathy, cervical region: Secondary | ICD-10-CM

## 2015-11-25 DIAGNOSIS — F418 Other specified anxiety disorders: Secondary | ICD-10-CM

## 2015-11-25 DIAGNOSIS — IMO0001 Reserved for inherently not codable concepts without codable children: Secondary | ICD-10-CM

## 2015-11-25 DIAGNOSIS — M5412 Radiculopathy, cervical region: Secondary | ICD-10-CM

## 2015-11-25 DIAGNOSIS — Z1231 Encounter for screening mammogram for malignant neoplasm of breast: Secondary | ICD-10-CM

## 2015-11-25 DIAGNOSIS — M4682 Other specified inflammatory spondylopathies, cervical region: Secondary | ICD-10-CM

## 2015-11-25 DIAGNOSIS — M4722 Other spondylosis with radiculopathy, cervical region: Secondary | ICD-10-CM

## 2015-11-25 DIAGNOSIS — Z124 Encounter for screening for malignant neoplasm of cervix: Secondary | ICD-10-CM

## 2015-11-25 MED ORDER — OXYCODONE HCL 15 MG PO TABS: 15.0000 mg | ORAL_TABLET | ORAL | Status: DC | PRN

## 2015-11-25 MED ORDER — ALPRAZOLAM 1 MG PO TABS: 1.0000 mg | ORAL_TABLET | Freq: Two times a day (BID) | ORAL | Status: DC | PRN

## 2015-11-25 NOTE — Progress Notes (Signed)
Subjective:  Patient ID: Brooke Garcia, female    DOB: Jan 13, 1956  Age: 60 y.o. MRN: [id number]  CC: Hypertension   HPI DALIAH AIKIN presents for a BP check. She tells me that her blood pressure has been well controlled with irbesartan and chlorthalidone. She denies any episodes of chest pain, shortness of breath, dyspnea on exertion, edema, fatigue, or diaphoresis.  She complains of worsening pain over her left lower extremity at the site of an ulcer. She tells me that she is being seen twice a week at a wound care center in [place] and says that they are debriding the wound and that the debridements are very painful. She requests that I increase the number on her oxycodone prescription for me 101-120 month. She said sometimes she takes 2 prior to the debridement just to get through it.  She did not have any symptoms related to the COPD and decided not to use her inhaler. She denies coughing, wheezing, shortness of breath, or night sweats.  Outpatient Prescriptions Prior to Visit  Medication Sig Dispense Refill  . chlorthalidone (HYGROTON) 25 MG tablet Take 1 tablet (25 mg total) by mouth daily. (Patient taking differently: Take 12.5 mg by mouth daily. Pt taking every other day due to side effects. Cramping) 90 tablet 1  . irbesartan (AVAPRO) 300 MG tablet TAKE 1 TABLET(300 MG) BY MOUTH DAILY 90 tablet 3  . pantoprazole (PROTONIX) 40 MG tablet TAKE 1 TABLET BY MOUTH DAILY 90 tablet 3  . rivaroxaban (XARELTO) 20 MG TABS tablet Take 1 tablet (20 mg total) by mouth daily with supper. 30 tablet 5  . ALPRAZolam (XANAX) 1 MG tablet Take 1 tablet (1 mg total) by mouth 2 (two) times daily as needed for anxiety. 60 tablet 3  . oxyCODONE (ROXICODONE) 15 MG immediate release tablet Take 1 tablet (15 mg total) by mouth every 4 (four) hours as needed for pain. 100 tablet 0  . SANTYL ointment Apply 1 application topically daily. Reported on 11/25/2015    . amoxicillin-clavulanate (AUGMENTIN) 875-125  MG tablet Take 1 tablet by mouth 2 (two) times daily. One po bid x 7 days 14 tablet 0  . ciclopirox (PENLAC) 8 % solution Apply topically at bedtime. Apply over nail and surrounding skin. Apply daily over previous coat. After seven (7) days, may remove with alcohol and continue cycle. (Patient not taking: Reported on 09/08/2015) 6.6 mL 4  . mirtazapine (REMERON) 30 MG tablet TAKE 1 TABLET BY MOUTH EVERY NIGHT AT BEDTIME (Patient not taking: Reported on 11/25/2015) 90 tablet 3  . sulfamethoxazole-trimethoprim (BACTRIM DS,SEPTRA DS) 800-160 MG tablet Take 1 tablet by mouth 2 (two) times daily. 14 tablet 0  . Umeclidinium Bromide (INCRUSE ELLIPTA) 62.5 MCG/INH AEPB Inhale 1 puff into the lungs daily. (Patient not taking: Reported on 11/25/2015) 30 each 11   No facility-administered medications prior to visit.    ROS Review of Systems  Constitutional: Negative.  Negative for fever, chills, diaphoresis, appetite change and fatigue.  HENT: Negative.   Eyes: Negative.   Respiratory: Negative.  Negative for cough, choking, chest tightness, shortness of breath and stridor.   Cardiovascular: Negative.  Negative for chest pain, palpitations and leg swelling.  Gastrointestinal: Negative.  Negative for nausea, vomiting, abdominal pain, diarrhea, constipation and blood in stool.  Endocrine: Negative.   Genitourinary: Negative.   Musculoskeletal: Positive for myalgias and neck pain. Negative for back pain, joint swelling, arthralgias and gait problem.       Severe pain and  left lower extremity  Skin: Positive for wound. Negative for color change.  Allergic/Immunologic: Negative.   Neurological: Negative.   Hematological: Negative.  Negative for adenopathy.  Psychiatric/Behavioral: Positive for sleep disturbance and dysphoric mood. Negative for suicidal ideas, hallucinations, behavioral problems, confusion, self-injury, decreased concentration and agitation. The patient is nervous/anxious. The patient is not  hyperactive.     Objective:  BP 140/90 mmHg  Pulse 84  Temp(Src) 98.4 F (36.9 C) (Oral)  Resp 16  Ht 5\' 3"  (1.6 m)  Wt 199 lb (90.266 kg)  BMI 35.26 kg/m2  SpO2 97%  BP Readings from Last 3 Encounters:  11/25/15 140/90  09/09/15 148/77  08/28/15 164/106    Wt Readings from Last 3 Encounters:  11/25/15 199 lb (90.266 kg)  08/28/15 198 lb (89.812 kg)  07/07/15 189 lb 4 oz (85.843 kg)    Physical Exam  Constitutional: She is oriented to person, place, and time. She appears well-developed and well-nourished.  Non-toxic appearance. She does not have a sickly appearance. She does not appear ill. No distress.  HENT:  Mouth/Throat: Oropharynx is clear and moist. No oropharyngeal exudate.  Eyes: Conjunctivae are normal. Right eye exhibits no discharge. Left eye exhibits no discharge. No scleral icterus.  Neck: Normal range of motion. Neck supple. No JVD present. No tracheal deviation present. No thyromegaly present.  Cardiovascular: Normal rate, regular rhythm, normal heart sounds and intact distal pulses.  Exam reveals no gallop and no friction rub.   No murmur heard. Pulmonary/Chest: Effort normal and breath sounds normal. No stridor. No respiratory distress. She has no wheezes. She has no rales. She exhibits no tenderness.  Abdominal: Soft. Bowel sounds are normal. She exhibits no distension and no mass. There is no tenderness. There is no rebound and no guarding.  Musculoskeletal: Normal range of motion. She exhibits no edema or tenderness.  Left lower extremity is covered with a multilayer dressing. Right lower extremity is negative for edema, wounds, or ulcers.  Lymphadenopathy:    She has no cervical adenopathy.  Neurological: She is oriented to person, place, and time.  Skin: Skin is warm and dry. No rash noted. She is not diaphoretic. No erythema. No pallor.  Psychiatric: She has a normal mood and affect. Her behavior is normal. Judgment and thought content normal.    Vitals reviewed.   Lab Results  Component Value Date   WBC 9.5 09/08/2015   HGB 13.8 09/08/2015   HCT 39.9 09/08/2015   PLT 260 09/08/2015   GLUCOSE 109* 09/08/2015   CHOL 170 01/09/2015   TRIG 130.0 01/09/2015   HDL 43.20 01/09/2015   LDLCALC 101* 01/09/2015   ALT 23 09/08/2015   AST 43* 09/08/2015   NA 137 09/08/2015   K 3.2* 09/08/2015   CL 100* 09/08/2015   CREATININE 0.75 09/08/2015   BUN <5* 09/08/2015   CO2 25 09/08/2015   TSH 1.02 01/09/2015   INR 1.72* 09/08/2015    No results found.  Assessment & Plan:   Janeane was seen today for hypertension.  Diagnoses and all orders for this visit:  HYPERTENSION, BENIGN ESSENTIAL- her blood pressures well controlled, I will monitor her electrolytes and renal function today. -     Basic metabolic panel; Future -     Magnesium; Future -     TSH; Future  HYPERCOAGULABLE STATE, PRIMARY -     CBC with Differential/Platelet; Future  Deep vein thrombosis (DVT) of both lower extremities, unspecified chronicity, unspecified vein (HCC)  Hyperlipidemia with target  LDL less than 130- she is due for fasting lipid panel -     Lipid panel; Future -     TSH; Future  Pulmonary embolism, other ([place])  Leg ulcer, left, with fat layer exposed ([place])- oxycodone prescription refilled and at a higher dose. I attempted to collect urine from her for urine drug screen, she drank water and was here for 2 hours and never produced any urine. If she does not produce urine for urine drug screen soon I will begin the process of tapering down her oxycodone dose -     Discontinue: oxyCODONE (ROXICODONE) 15 MG immediate release tablet; Take 1 tablet (15 mg total) by mouth every 4 (four) hours as needed for pain. -     Discontinue: oxyCODONE (ROXICODONE) 15 MG immediate release tablet; Take 1 tablet (15 mg total) by mouth every 4 (four) hours as needed for pain. -     Discontinue: oxyCODONE (ROXICODONE) 15 MG immediate release tablet; Take 1 tablet (15  mg total) by mouth every 4 (four) hours as needed for pain. -     oxyCODONE (ROXICODONE) 15 MG immediate release tablet; Take 1 tablet (15 mg total) by mouth every 4 (four) hours as needed for pain.  Depression with anxiety- prescription was refilled today, unfortunately she will not give [place] urine for urine drug screen, if she is not willing to offer urine then I'll begin the process of tapering down her Xanax dose -     ALPRAZolam (XANAX) 1 MG tablet; Take 1 tablet (1 mg total) by mouth 2 (two) times daily as needed for anxiety.  Cervical spondylitis with radiculitis (HCC) -     Discontinue: oxyCODONE (ROXICODONE) 15 MG immediate release tablet; Take 1 tablet (15 mg total) by mouth every 4 (four) hours as needed for pain. -     Discontinue: oxyCODONE (ROXICODONE) 15 MG immediate release tablet; Take 1 tablet (15 mg total) by mouth every 4 (four) hours as needed for pain. -     Discontinue: oxyCODONE (ROXICODONE) 15 MG immediate release tablet; Take 1 tablet (15 mg total) by mouth every 4 (four) hours as needed for pain. -     oxyCODONE (ROXICODONE) 15 MG immediate release tablet; Take 1 tablet (15 mg total) by mouth every 4 (four) hours as needed for pain.  Leg ulcer, left, with unspecified severity ([place]) -     Discontinue: oxyCODONE (ROXICODONE) 15 MG immediate release tablet; Take 1 tablet (15 mg total) by mouth every 4 (four) hours as needed for pain. -     Discontinue: oxyCODONE (ROXICODONE) 15 MG immediate release tablet; Take 1 tablet (15 mg total) by mouth every 4 (four) hours as needed for pain. -     Discontinue: oxyCODONE (ROXICODONE) 15 MG immediate release tablet; Take 1 tablet (15 mg total) by mouth every 4 (four) hours as needed for pain. -     oxyCODONE (ROXICODONE) 15 MG immediate release tablet; Take 1 tablet (15 mg total) by mouth every 4 (four) hours as needed for pain.  Venous stasis ulcer, left (HCC) -     Discontinue: oxyCODONE (ROXICODONE) 15 MG immediate release tablet;  Take 1 tablet (15 mg total) by mouth every 4 (four) hours as needed for pain. -     Discontinue: oxyCODONE (ROXICODONE) 15 MG immediate release tablet; Take 1 tablet (15 mg total) by mouth every 4 (four) hours as needed for pain. -     Discontinue: oxyCODONE (ROXICODONE) 15 MG immediate release tablet; Take 1 tablet (  15 mg total) by mouth every 4 (four) hours as needed for pain. -     oxyCODONE (ROXICODONE) 15 MG immediate release tablet; Take 1 tablet (15 mg total) by mouth every 4 (four) hours as needed for pain.  Visit for screening mammogram -     MM DIGITAL SCREENING BILATERAL; Future  Screening for cervical cancer -     Ambulatory referral to Gynecology  I have discontinued Ms. Siharath's mirtazapine, umeclidinium bromide, ciclopirox, amoxicillin-clavulanate, and sulfamethoxazole-trimethoprim. I am also having her maintain her SANTYL, rivaroxaban, pantoprazole, chlorthalidone, irbesartan, oxyCODONE, and ALPRAZolam.  Meds ordered this encounter  Medications  . DISCONTD: oxyCODONE (ROXICODONE) 15 MG immediate release tablet    Sig: Take 1 tablet (15 mg total) by mouth every 4 (four) hours as needed for pain.    Dispense:  120 tablet    Refill:  0    Fill on or after 11/25/15  . DISCONTD: oxyCODONE (ROXICODONE) 15 MG immediate release tablet    Sig: Take 1 tablet (15 mg total) by mouth every 4 (four) hours as needed for pain.    Dispense:  120 tablet    Refill:  0    Fill on or after 12/26/15  . DISCONTD: oxyCODONE (ROXICODONE) 15 MG immediate release tablet    Sig: Take 1 tablet (15 mg total) by mouth every 4 (four) hours as needed for pain.    Dispense:  120 tablet    Refill:  0    Fill on or after 01/25/16  . oxyCODONE (ROXICODONE) 15 MG immediate release tablet    Sig: Take 1 tablet (15 mg total) by mouth every 4 (four) hours as needed for pain.    Dispense:  120 tablet    Refill:  0    Fill on or after 02/25/16  . ALPRAZolam (XANAX) 1 MG tablet    Sig: Take 1 tablet (1 mg  total) by mouth 2 (two) times daily as needed for anxiety.    Dispense:  60 tablet    Refill:  3     Follow-up: Return in about 4 months (around 03/26/2016).  Scarlette Calico, MD

## 2015-11-25 NOTE — Telephone Encounter (Signed)
yes

## 2015-11-25 NOTE — Patient Instructions (Signed)
Hypertension Hypertension, commonly called high blood pressure, is when the force of blood pumping through your arteries is too strong. Your arteries are the blood vessels that carry blood from your heart throughout your body. A blood pressure reading consists of a higher number over a lower number, such as 110/72. The higher number (systolic) is the pressure inside your arteries when your heart pumps. The lower number (diastolic) is the pressure inside your arteries when your heart relaxes. Ideally you want your blood pressure below 120/80. Hypertension forces your heart to work harder to pump blood. Your arteries may become narrow or stiff. Having untreated or uncontrolled hypertension can cause heart attack, stroke, kidney disease, and other problems. RISK FACTORS Some risk factors for high blood pressure are controllable. Others are not.  Risk factors you cannot control include:   Race. You may be at higher risk if you are African American.  Age. Risk increases with age.  Gender. Men are at higher risk than women before age 45 years. After age 65, women are at higher risk than men. Risk factors you can control include:  Not getting enough exercise or physical activity.  Being overweight.  Getting too much fat, sugar, calories, or salt in your diet.  Drinking too much alcohol. SIGNS AND SYMPTOMS Hypertension does not usually cause signs or symptoms. Extremely high blood pressure (hypertensive crisis) may cause headache, anxiety, shortness of breath, and nosebleed. DIAGNOSIS To check if you have hypertension, your health care provider will measure your blood pressure while you are seated, with your arm held at the level of your heart. It should be measured at least twice using the same arm. Certain conditions can cause a difference in blood pressure between your right and left arms. A blood pressure reading that is higher than normal on one occasion does not mean that you need treatment. If  it is not clear whether you have high blood pressure, you may be asked to return on a different day to have your blood pressure checked again. Or, you may be asked to monitor your blood pressure at home for 1 or more weeks. TREATMENT Treating high blood pressure includes making lifestyle changes and possibly taking medicine. Living a healthy lifestyle can help lower high blood pressure. You may need to change some of your habits. Lifestyle changes may include:  Following the DASH diet. This diet is high in fruits, vegetables, and whole grains. It is low in salt, red meat, and added sugars.  Keep your sodium intake below 2,300 mg per day.  Getting at least 30-45 minutes of aerobic exercise at least 4 times per week.  Losing weight if necessary.  Not smoking.  Limiting alcoholic beverages.  Learning ways to reduce stress. Your health care provider may prescribe medicine if lifestyle changes are not enough to get your blood pressure under control, and if one of the following is true:  You are 18-59 years of age and your systolic blood pressure is above 140.  You are 60 years of age or older, and your systolic blood pressure is above 150.  Your diastolic blood pressure is above 90.  You have diabetes, and your systolic blood pressure is over 140 or your diastolic blood pressure is over 90.  You have kidney disease and your blood pressure is above 140/90.  You have heart disease and your blood pressure is above 140/90. Your personal target blood pressure may vary depending on your medical conditions, your age, and other factors. HOME CARE INSTRUCTIONS    Have your blood pressure rechecked as directed by your health care provider.   Take medicines only as directed by your health care provider. Follow the directions carefully. Blood pressure medicines must be taken as prescribed. The medicine does not work as well when you skip doses. Skipping doses also puts you at risk for  problems.  Do not smoke.   Monitor your blood pressure at home as directed by your health care provider. SEEK MEDICAL CARE IF:   You think you are having a reaction to medicines taken.  You have recurrent headaches or feel dizzy.  You have swelling in your ankles.  You have trouble with your vision. SEEK IMMEDIATE MEDICAL CARE IF:  You develop a severe headache or confusion.  You have unusual weakness, numbness, or feel faint.  You have severe chest or abdominal pain.  You vomit repeatedly.  You have trouble breathing. MAKE SURE YOU:   Understand these instructions.  Will watch your condition.  Will get help right away if you are not doing well or get worse.   This information is not intended to replace advice given to you by your health care provider. Make sure you discuss any questions you have with your health care provider.   Document Released: 08/30/2005 Document Revised: 01/14/2015 Document Reviewed: 06/22/2013 Elsevier Interactive Patient Education 2016 [employer]  

## 2015-11-25 NOTE — Progress Notes (Signed)
Pre visit review using our clinic review tool, if applicable. No additional management support is needed unless otherwise documented below in the visit note. 

## 2015-11-25 NOTE — Telephone Encounter (Signed)
FYI: Had asked PCP for advise around 4 pm - instructed to let patient if she could not produce.  Pt was in the restroom for 2 hours and not able to produce urine to send to toxicology. Patient left facility at 5:03pm  Do you want patient to come back in tomorrow or another day to leave a sample?

## 2015-11-26 NOTE — Telephone Encounter (Signed)
LVM for pt to call back as soon as possible.   RE: need urine sample for any additional refills.

## 2015-11-26 NOTE — Telephone Encounter (Signed)
Did she do her blood work yesterday?

## 2015-11-26 NOTE — Telephone Encounter (Signed)
Pt called back and she can't come in today. She is on her way to [facility] She has an appt with the wound center tomorrow and she said she come in then. If that doesn't work please give her a call

## 2015-11-26 NOTE — Telephone Encounter (Signed)
She did not. 

## 2015-11-26 NOTE — Telephone Encounter (Signed)
See note below and advise if this is acceptable, pls

## 2015-11-27 ENCOUNTER — Other Ambulatory Visit (INDEPENDENT_AMBULATORY_CARE_PROVIDER_SITE_OTHER): Payer: Medicare Other

## 2015-11-27 ENCOUNTER — Encounter: Payer: Self-pay | Admitting: Internal Medicine

## 2015-11-27 DIAGNOSIS — D6859 Other primary thrombophilia: Secondary | ICD-10-CM

## 2015-11-27 DIAGNOSIS — I83222 Varicose veins of left lower extremity with both ulcer of calf and inflammation: Secondary | ICD-10-CM | POA: Diagnosis not present

## 2015-11-27 DIAGNOSIS — Z79891 Long term (current) use of opiate analgesic: Secondary | ICD-10-CM | POA: Diagnosis not present

## 2015-11-27 DIAGNOSIS — L97221 Non-pressure chronic ulcer of left calf limited to breakdown of skin: Secondary | ICD-10-CM | POA: Diagnosis not present

## 2015-11-27 DIAGNOSIS — I10 Essential (primary) hypertension: Secondary | ICD-10-CM | POA: Diagnosis not present

## 2015-11-27 DIAGNOSIS — Z124 Encounter for screening for malignant neoplasm of cervix: Secondary | ICD-10-CM | POA: Insufficient documentation

## 2015-11-27 DIAGNOSIS — Z79899 Other long term (current) drug therapy: Secondary | ICD-10-CM | POA: Diagnosis not present

## 2015-11-27 DIAGNOSIS — E785 Hyperlipidemia, unspecified: Secondary | ICD-10-CM | POA: Diagnosis not present

## 2015-11-27 DIAGNOSIS — Z86718 Personal history of other venous thrombosis and embolism: Secondary | ICD-10-CM | POA: Diagnosis not present

## 2015-11-27 DIAGNOSIS — L97821 Non-pressure chronic ulcer of other part of left lower leg limited to breakdown of skin: Secondary | ICD-10-CM | POA: Diagnosis not present

## 2015-11-27 LAB — TSH: TSH: 1.4 u[IU]/mL (ref 0.35–4.50)

## 2015-11-27 LAB — LIPID PANEL
CHOL/HDL RATIO: 4
CHOLESTEROL: 145 mg/dL (ref 0–200)
HDL: 40.9 mg/dL (ref >39.00)
LDL Cholesterol: 77 mg/dL (ref 0–99)
NonHDL: 103.97
TRIGLYCERIDES: 134 mg/dL (ref 0.0–149.0)
VLDL: 26.8 mg/dL (ref 0.0–40.0)

## 2015-11-27 LAB — BASIC METABOLIC PANEL
BUN: 5 mg/dL — AB (ref 6–23)
CHLORIDE: 102 meq/L (ref 96–112)
CO2: 30 mEq/L (ref 19–32)
Calcium: 9.4 mg/dL (ref 8.4–10.5)
Creatinine, Ser: 0.98 mg/dL (ref 0.40–1.20)
GFR: 61.67 mL/min (ref >60.00)
Glucose, Bld: 114 mg/dL — ABNORMAL HIGH (ref 70–99)
POTASSIUM: 3.8 meq/L (ref 3.5–5.1)
SODIUM: 138 meq/L (ref 135–145)

## 2015-11-27 LAB — CBC WITH DIFFERENTIAL/PLATELET
Basophils Absolute: 0 10*3/uL (ref 0.0–0.1)
Basophils Relative: 0.4 % (ref 0.0–3.0)
EOS ABS: 0.1 10*3/uL (ref 0.0–0.7)
EOS PCT: 1.5 % (ref 0.0–5.0)
HCT: 40.4 % (ref 36.0–46.0)
Hemoglobin: 14 g/dL (ref 12.0–15.0)
LYMPHS ABS: 2.7 10*3/uL (ref 0.7–4.0)
Lymphocytes Relative: 29 % (ref 12.0–46.0)
MCHC: 34.6 g/dL (ref 30.0–36.0)
MCV: 91.6 fl (ref 78.0–100.0)
MONO ABS: 0.4 10*3/uL (ref 0.1–1.0)
MONOS PCT: 4.3 % (ref 3.0–12.0)
NEUTROS ABS: 6 10*3/uL (ref 1.4–7.7)
NEUTROS PCT: 64.8 % (ref 43.0–77.0)
PLATELETS: 228 10*3/uL (ref 150.0–400.0)
RBC: 4.41 Mil/uL (ref 3.87–5.11)
RDW: 14.1 % (ref 11.5–15.5)
WBC: 9.3 10*3/uL (ref 4.0–10.5)

## 2015-11-27 LAB — MAGNESIUM: Magnesium: 1.6 mg/dL (ref 1.5–2.5)

## 2015-11-28 NOTE — Telephone Encounter (Signed)
LVM for pt to call back as soon as possible.   RE: PCP needs a urine sample

## 2015-12-04 DIAGNOSIS — Z86718 Personal history of other venous thrombosis and embolism: Secondary | ICD-10-CM | POA: Diagnosis not present

## 2015-12-04 DIAGNOSIS — L97221 Non-pressure chronic ulcer of left calf limited to breakdown of skin: Secondary | ICD-10-CM | POA: Diagnosis not present

## 2015-12-04 DIAGNOSIS — I83222 Varicose veins of left lower extremity with both ulcer of calf and inflammation: Secondary | ICD-10-CM | POA: Diagnosis not present

## 2015-12-04 DIAGNOSIS — I10 Essential (primary) hypertension: Secondary | ICD-10-CM | POA: Diagnosis not present

## 2015-12-04 DIAGNOSIS — I87312 Chronic venous hypertension (idiopathic) with ulcer of left lower extremity: Secondary | ICD-10-CM | POA: Diagnosis not present

## 2015-12-04 NOTE — Telephone Encounter (Signed)
Pt still has not called back. Please advise if there is anything I can do further.

## 2015-12-04 NOTE — Telephone Encounter (Signed)
This is all been taken care of

## 2015-12-08 ENCOUNTER — Ambulatory Visit: Payer: Medicare Other | Admitting: Podiatry

## 2015-12-11 ENCOUNTER — Ambulatory Visit: Payer: Medicare Other | Admitting: Podiatry

## 2015-12-11 DIAGNOSIS — L97221 Non-pressure chronic ulcer of left calf limited to breakdown of skin: Secondary | ICD-10-CM | POA: Diagnosis not present

## 2015-12-11 DIAGNOSIS — I83222 Varicose veins of left lower extremity with both ulcer of calf and inflammation: Secondary | ICD-10-CM | POA: Diagnosis not present

## 2015-12-11 DIAGNOSIS — I10 Essential (primary) hypertension: Secondary | ICD-10-CM | POA: Diagnosis not present

## 2015-12-11 DIAGNOSIS — Z86718 Personal history of other venous thrombosis and embolism: Secondary | ICD-10-CM | POA: Diagnosis not present

## 2015-12-18 ENCOUNTER — Ambulatory Visit: Payer: Medicare Other | Admitting: Podiatry

## 2015-12-18 ENCOUNTER — Ambulatory Visit: Payer: Medicare Other

## 2015-12-19 ENCOUNTER — Telehealth: Payer: Self-pay | Admitting: Obstetrics and Gynecology

## 2015-12-19 NOTE — Telephone Encounter (Signed)
Called and left a message for patient to call back to schedule a new patient doctor referral. °

## 2015-12-22 ENCOUNTER — Other Ambulatory Visit: Payer: Self-pay | Admitting: Internal Medicine

## 2016-01-01 ENCOUNTER — Encounter (HOSPITAL_BASED_OUTPATIENT_CLINIC_OR_DEPARTMENT_OTHER): Payer: Medicare Other | Attending: Internal Medicine

## 2016-01-01 DIAGNOSIS — I10 Essential (primary) hypertension: Secondary | ICD-10-CM | POA: Insufficient documentation

## 2016-01-01 DIAGNOSIS — I83222 Varicose veins of left lower extremity with both ulcer of calf and inflammation: Secondary | ICD-10-CM | POA: Insufficient documentation

## 2016-01-01 DIAGNOSIS — Z86718 Personal history of other venous thrombosis and embolism: Secondary | ICD-10-CM | POA: Insufficient documentation

## 2016-01-01 DIAGNOSIS — L97821 Non-pressure chronic ulcer of other part of left lower leg limited to breakdown of skin: Secondary | ICD-10-CM | POA: Diagnosis not present

## 2016-01-01 DIAGNOSIS — L97221 Non-pressure chronic ulcer of left calf limited to breakdown of skin: Secondary | ICD-10-CM | POA: Diagnosis not present

## 2016-01-02 ENCOUNTER — Encounter (HOSPITAL_BASED_OUTPATIENT_CLINIC_OR_DEPARTMENT_OTHER): Payer: Medicare Other

## 2016-01-08 ENCOUNTER — Ambulatory Visit: Payer: Medicare Other

## 2016-01-08 ENCOUNTER — Ambulatory Visit: Payer: Medicare Other | Admitting: Obstetrics and Gynecology

## 2016-01-08 ENCOUNTER — Ambulatory Visit: Payer: Medicare Other | Admitting: Podiatry

## 2016-01-09 DIAGNOSIS — I83222 Varicose veins of left lower extremity with both ulcer of calf and inflammation: Secondary | ICD-10-CM | POA: Diagnosis not present

## 2016-01-09 DIAGNOSIS — I87312 Chronic venous hypertension (idiopathic) with ulcer of left lower extremity: Secondary | ICD-10-CM | POA: Diagnosis not present

## 2016-01-09 DIAGNOSIS — I10 Essential (primary) hypertension: Secondary | ICD-10-CM | POA: Diagnosis not present

## 2016-01-09 DIAGNOSIS — L97221 Non-pressure chronic ulcer of left calf limited to breakdown of skin: Secondary | ICD-10-CM | POA: Diagnosis not present

## 2016-01-09 DIAGNOSIS — Z86718 Personal history of other venous thrombosis and embolism: Secondary | ICD-10-CM | POA: Diagnosis not present

## 2016-01-13 ENCOUNTER — Other Ambulatory Visit: Payer: Self-pay | Admitting: *Deleted

## 2016-01-13 DIAGNOSIS — T8189XA Other complications of procedures, not elsewhere classified, initial encounter: Secondary | ICD-10-CM

## 2016-01-15 ENCOUNTER — Encounter: Payer: Self-pay | Admitting: Vascular Surgery

## 2016-01-15 ENCOUNTER — Encounter (HOSPITAL_BASED_OUTPATIENT_CLINIC_OR_DEPARTMENT_OTHER): Payer: Medicare Other | Attending: Internal Medicine

## 2016-01-15 DIAGNOSIS — I83222 Varicose veins of left lower extremity with both ulcer of calf and inflammation: Secondary | ICD-10-CM | POA: Diagnosis not present

## 2016-01-15 DIAGNOSIS — L97821 Non-pressure chronic ulcer of other part of left lower leg limited to breakdown of skin: Secondary | ICD-10-CM | POA: Diagnosis not present

## 2016-01-15 DIAGNOSIS — I87312 Chronic venous hypertension (idiopathic) with ulcer of left lower extremity: Secondary | ICD-10-CM | POA: Diagnosis not present

## 2016-01-15 DIAGNOSIS — I739 Peripheral vascular disease, unspecified: Secondary | ICD-10-CM | POA: Insufficient documentation

## 2016-01-15 DIAGNOSIS — I10 Essential (primary) hypertension: Secondary | ICD-10-CM | POA: Insufficient documentation

## 2016-01-20 ENCOUNTER — Ambulatory Visit (HOSPITAL_COMMUNITY): Payer: Medicare Other

## 2016-01-20 ENCOUNTER — Ambulatory Visit: Payer: Medicare Other | Admitting: Vascular Surgery

## 2016-01-22 ENCOUNTER — Ambulatory Visit: Payer: Medicare Other | Admitting: Obstetrics and Gynecology

## 2016-01-23 ENCOUNTER — Telehealth: Payer: Self-pay | Admitting: Obstetrics and Gynecology

## 2016-01-23 DIAGNOSIS — I739 Peripheral vascular disease, unspecified: Secondary | ICD-10-CM | POA: Diagnosis not present

## 2016-01-23 DIAGNOSIS — I87312 Chronic venous hypertension (idiopathic) with ulcer of left lower extremity: Secondary | ICD-10-CM | POA: Diagnosis not present

## 2016-01-23 DIAGNOSIS — I83222 Varicose veins of left lower extremity with both ulcer of calf and inflammation: Secondary | ICD-10-CM | POA: Diagnosis not present

## 2016-01-23 DIAGNOSIS — L97821 Non-pressure chronic ulcer of other part of left lower leg limited to breakdown of skin: Secondary | ICD-10-CM | POA: Diagnosis not present

## 2016-01-23 DIAGNOSIS — I10 Essential (primary) hypertension: Secondary | ICD-10-CM | POA: Diagnosis not present

## 2016-01-23 NOTE — Telephone Encounter (Signed)
Called and left a message for patient to call back to schedule a new patient doctor referral. °

## 2016-01-27 ENCOUNTER — Encounter: Payer: Self-pay | Admitting: Vascular Surgery

## 2016-01-27 NOTE — Telephone Encounter (Signed)
Called and left a message for patient to call back to schedule a new patient doctor referral. °

## 2016-01-28 ENCOUNTER — Ambulatory Visit (HOSPITAL_COMMUNITY): Payer: Medicare Other | Attending: Vascular Surgery

## 2016-02-03 ENCOUNTER — Ambulatory Visit: Payer: Medicare Other | Admitting: Vascular Surgery

## 2016-02-05 NOTE — Telephone Encounter (Signed)
Called and left a message for patient to call back to schedule a new patient doctor referral. °

## 2016-02-23 NOTE — Telephone Encounter (Signed)
Referral routed back to referring office due to patient not returning calls to schedule.

## 2016-03-04 ENCOUNTER — Encounter: Payer: Self-pay | Admitting: Vascular Surgery

## 2016-03-11 ENCOUNTER — Ambulatory Visit (HOSPITAL_COMMUNITY): Payer: Medicare Other

## 2016-03-11 ENCOUNTER — Ambulatory Visit: Payer: Medicare Other | Admitting: Vascular Surgery

## 2016-03-18 ENCOUNTER — Encounter: Payer: Self-pay | Admitting: Internal Medicine

## 2016-03-18 ENCOUNTER — Ambulatory Visit (INDEPENDENT_AMBULATORY_CARE_PROVIDER_SITE_OTHER): Payer: Medicare Other | Admitting: Internal Medicine

## 2016-03-18 VITALS — BP 150/98 | HR 90 | Temp 99.1°F | Resp 22 | Wt 200.8 lb

## 2016-03-18 DIAGNOSIS — L97929 Non-pressure chronic ulcer of unspecified part of left lower leg with unspecified severity: Secondary | ICD-10-CM

## 2016-03-18 DIAGNOSIS — F418 Other specified anxiety disorders: Secondary | ICD-10-CM

## 2016-03-18 DIAGNOSIS — Z7901 Long term (current) use of anticoagulants: Secondary | ICD-10-CM | POA: Diagnosis not present

## 2016-03-18 DIAGNOSIS — M4692 Unspecified inflammatory spondylopathy, cervical region: Secondary | ICD-10-CM

## 2016-03-18 DIAGNOSIS — D6859 Other primary thrombophilia: Secondary | ICD-10-CM

## 2016-03-18 DIAGNOSIS — F17200 Nicotine dependence, unspecified, uncomplicated: Secondary | ICD-10-CM

## 2016-03-18 DIAGNOSIS — I10 Essential (primary) hypertension: Secondary | ICD-10-CM | POA: Diagnosis not present

## 2016-03-18 DIAGNOSIS — M4682 Other specified inflammatory spondylopathies, cervical region: Secondary | ICD-10-CM

## 2016-03-18 DIAGNOSIS — M5412 Radiculopathy, cervical region: Secondary | ICD-10-CM

## 2016-03-18 DIAGNOSIS — J410 Simple chronic bronchitis: Secondary | ICD-10-CM | POA: Diagnosis not present

## 2016-03-18 DIAGNOSIS — I83893 Varicose veins of bilateral lower extremities with other complications: Secondary | ICD-10-CM

## 2016-03-18 DIAGNOSIS — M4722 Other spondylosis with radiculopathy, cervical region: Secondary | ICD-10-CM

## 2016-03-18 DIAGNOSIS — K219 Gastro-esophageal reflux disease without esophagitis: Secondary | ICD-10-CM | POA: Insufficient documentation

## 2016-03-18 DIAGNOSIS — I83029 Varicose veins of left lower extremity with ulcer of unspecified site: Secondary | ICD-10-CM

## 2016-03-18 DIAGNOSIS — L97909 Non-pressure chronic ulcer of unspecified part of unspecified lower leg with unspecified severity: Secondary | ICD-10-CM

## 2016-03-18 DIAGNOSIS — I83009 Varicose veins of unspecified lower extremity with ulcer of unspecified site: Secondary | ICD-10-CM

## 2016-03-18 DIAGNOSIS — L97922 Non-pressure chronic ulcer of unspecified part of left lower leg with fat layer exposed: Secondary | ICD-10-CM

## 2016-03-18 DIAGNOSIS — IMO0001 Reserved for inherently not codable concepts without codable children: Secondary | ICD-10-CM

## 2016-03-18 MED ORDER — OXYCODONE HCL 15 MG PO TABS: 15.0000 mg | ORAL_TABLET | ORAL | Status: DC | PRN

## 2016-03-18 MED ORDER — CHLORTHALIDONE 25 MG PO TABS: 25.0000 mg | ORAL_TABLET | Freq: Every day | ORAL | Status: DC

## 2016-03-18 MED ORDER — IPRATROPIUM-ALBUTEROL 20-100 MCG/ACT IN AERS: 1.0000 | INHALATION_SPRAY | Freq: Four times a day (QID) | RESPIRATORY_TRACT | Status: DC

## 2016-03-18 MED ORDER — ALPRAZOLAM 1 MG PO TABS: 1.0000 mg | ORAL_TABLET | Freq: Two times a day (BID) | ORAL | Status: DC | PRN

## 2016-03-18 MED ORDER — PANTOPRAZOLE SODIUM 40 MG PO TBEC: 40.0000 mg | DELAYED_RELEASE_TABLET | Freq: Every day | ORAL | Status: DC

## 2016-03-18 MED ORDER — RIVAROXABAN 20 MG PO TABS: 20.0000 mg | ORAL_TABLET | Freq: Every day | ORAL | Status: DC

## 2016-03-18 NOTE — Progress Notes (Signed)
Subjective:  Patient ID: Brooke Garcia, female    DOB: [birth year]  Age: 60 y.o. MRN: [id number]  CC: Hypertension   HPI Brooke Garcia presents for a blood pressure check. She has been out of her antihypertensives for 2 days and therefore her blood pressure is not been well controlled. She denies headache, blurred vision, chest pain, or edema.  She complains of chronic cough and shortness of breath related to tobacco abuse. She was previously tried on LAMA/LABA/ICS inhalers but she states that they made her feel worse at night when she was using them. She has mild intermittent wheezing. She does not cough up any blood or colored phlegm. She continues to smoke.  Outpatient Prescriptions Prior to Visit  Medication Sig Dispense Refill  . irbesartan (AVAPRO) 300 MG tablet TAKE 1 TABLET(300 MG) BY MOUTH DAILY 90 tablet 3  . SANTYL ointment Apply 1 application topically daily. Reported on 11/25/2015    . ALPRAZolam (XANAX) 1 MG tablet Take 1 tablet (1 mg total) by mouth 2 (two) times daily as needed for anxiety. 60 tablet 3  . chlorthalidone (HYGROTON) 25 MG tablet Take 1 tablet (25 mg total) by mouth daily. (Patient taking differently: Take 12.5 mg by mouth daily. Pt taking every other day due to side effects. Cramping) 90 tablet 1  . oxyCODONE (ROXICODONE) 15 MG immediate release tablet Take 1 tablet (15 mg total) by mouth every 4 (four) hours as needed for pain. 120 tablet 0  . pantoprazole (PROTONIX) 40 MG tablet TAKE 1 TABLET BY MOUTH DAILY 90 tablet 3  . rivaroxaban (XARELTO) 20 MG TABS tablet Take 1 tablet (20 mg total) by mouth daily with supper. 90 tablet 1   No facility-administered medications prior to visit.    ROS Review of Systems  Constitutional: Negative.  Negative for fever, chills, diaphoresis, activity change, appetite change and fatigue.  HENT: Negative.  Negative for facial swelling, sinus pressure, sore throat and trouble swallowing.   Eyes: Negative.   Respiratory:  Positive for cough, shortness of breath and wheezing. Negative for choking, chest tightness and stridor.   Cardiovascular: Negative.  Negative for chest pain, palpitations and leg swelling.  Gastrointestinal: Negative.  Negative for nausea, abdominal pain, diarrhea and constipation.  Endocrine: Negative.   Genitourinary: Negative.  Negative for dysuria, urgency, hematuria, decreased urine volume and difficulty urinating.  Musculoskeletal: Positive for myalgias, arthralgias and neck pain.       She has chronic, unchanged pain in both lower extremities. She is being treated for venous ulcers in both lower extremities and currently has an Haematologist over both lower extremities.  Skin: Positive for wound. Negative for color change.  Neurological: Negative.  Negative for dizziness.  Hematological: Negative.  Negative for adenopathy. Does not bruise/bleed easily.  Psychiatric/Behavioral: Positive for sleep disturbance. Negative for suicidal ideas, self-injury, dysphoric mood, decreased concentration and agitation. The patient is nervous/anxious.     Objective:  BP 150/98 mmHg  Pulse 90  Temp(Src) 99.1 F (37.3 C) (Oral)  Resp 22  Wt 200 lb 12.8 oz (91.082 kg)  SpO2 92%  BP Readings from Last 3 Encounters:  03/18/16 150/98  11/25/15 140/90  09/09/15 148/77    Wt Readings from Last 3 Encounters:  03/18/16 200 lb 12.8 oz (91.082 kg)  11/25/15 199 lb (90.266 kg)  08/28/15 198 lb (89.812 kg)    Physical Exam  Constitutional: She is oriented to person, place, and time. No distress.  HENT:  Mouth/Throat: Oropharynx is clear and  moist. No oropharyngeal exudate.  Eyes: Conjunctivae are normal. Right eye exhibits no discharge. Left eye exhibits no discharge. No scleral icterus.  Neck: Normal range of motion. Neck supple. No JVD present. No tracheal deviation present. No thyromegaly present.  Cardiovascular: Normal rate, regular rhythm, normal heart sounds and intact distal pulses.  Exam  reveals no gallop and no friction rub.   No murmur heard. Pulmonary/Chest: Effort normal and breath sounds normal. No stridor. No respiratory distress. She has no wheezes. She has no rales. She exhibits no tenderness.  Abdominal: Soft. Bowel sounds are normal. She exhibits no distension and no mass. There is no tenderness. There is no rebound and no guarding.  Musculoskeletal: Normal range of motion. She exhibits no edema or tenderness.  Lymphadenopathy:    She has no cervical adenopathy.  Neurological: She is oriented to person, place, and time.  Skin: Skin is warm and dry. No rash noted. She is not diaphoretic. No erythema. No pallor.  Psychiatric: She has a normal mood and affect. Her behavior is normal. Judgment and thought content normal.  Vitals reviewed.   Lab Results  Component Value Date   WBC 9.3 11/27/2015   HGB 14.0 11/27/2015   HCT 40.4 11/27/2015   PLT 228.0 11/27/2015   GLUCOSE 114* 11/27/2015   CHOL 145 11/27/2015   TRIG 134.0 11/27/2015   HDL 40.90 11/27/2015   LDLCALC 77 11/27/2015   ALT 23 09/08/2015   AST 43* 09/08/2015   NA 138 11/27/2015   K 3.8 11/27/2015   CL 102 11/27/2015   CREATININE 0.98 11/27/2015   BUN 5* 11/27/2015   CO2 30 11/27/2015   TSH 1.40 11/27/2015   INR 1.72* 09/08/2015    No results found.  Assessment & Plan:   Brooke Garcia was seen today for hypertension.  Diagnoses and all orders for this visit:  Simple chronic bronchitis ([place])- asked her to quit smoking, will see if she would benefit from short acting agents rather than the long-acting inhalers. -     Ipratropium-Albuterol (COMBIVENT RESPIMAT) 20-100 MCG/ACT AERS respimat; Inhale 1 puff into the lungs every 6 (six) hours. -     Ambulatory Referral for Lung Cancer Scre  HYPERTENSION, BENIGN ESSENTIAL- her blood pressure is not adequately well controlled, she will restart her usual agents. -     chlorthalidone (HYGROTON) 25 MG tablet; Take 1 tablet (25 mg total) by mouth  daily.  Varicose veins of lower extremities with complications, bilateral- this is being treated by wound care and vascular  Anticoagulant long-term use  Venous stasis ulcer, unspecified laterality ([place])- followed by wound care and vascular surgery  HYPERCOAGULABLE STATE, PRIMARY- will continue anticoagulation with Xarelto -     rivaroxaban (XARELTO) 20 MG TABS tablet; Take 1 tablet (20 mg total) by mouth daily with supper.  Depression with anxiety -     ALPRAZolam (XANAX) 1 MG tablet; Take 1 tablet (1 mg total) by mouth 2 (two) times daily as needed for anxiety.  Cervical spondylitis with radiculitis (HCC) -     Discontinue: oxyCODONE (ROXICODONE) 15 MG immediate release tablet; Take 1 tablet (15 mg total) by mouth every 4 (four) hours as needed for pain. -     Discontinue: oxyCODONE (ROXICODONE) 15 MG immediate release tablet; Take 1 tablet (15 mg total) by mouth every 4 (four) hours as needed for pain. -     Discontinue: oxyCODONE (ROXICODONE) 15 MG immediate release tablet; Take 1 tablet (15 mg total) by mouth every 4 (four) hours as  needed for pain. -     oxyCODONE (ROXICODONE) 15 MG immediate release tablet; Take 1 tablet (15 mg total) by mouth every 4 (four) hours as needed for pain.  Leg ulcer, left, with unspecified severity ([place]) -     Discontinue: oxyCODONE (ROXICODONE) 15 MG immediate release tablet; Take 1 tablet (15 mg total) by mouth every 4 (four) hours as needed for pain. -     Discontinue: oxyCODONE (ROXICODONE) 15 MG immediate release tablet; Take 1 tablet (15 mg total) by mouth every 4 (four) hours as needed for pain. -     Discontinue: oxyCODONE (ROXICODONE) 15 MG immediate release tablet; Take 1 tablet (15 mg total) by mouth every 4 (four) hours as needed for pain. -     oxyCODONE (ROXICODONE) 15 MG immediate release tablet; Take 1 tablet (15 mg total) by mouth every 4 (four) hours as needed for pain.  Leg ulcer, left, with fat layer exposed ([place]) -     Discontinue:  oxyCODONE (ROXICODONE) 15 MG immediate release tablet; Take 1 tablet (15 mg total) by mouth every 4 (four) hours as needed for pain. -     Discontinue: oxyCODONE (ROXICODONE) 15 MG immediate release tablet; Take 1 tablet (15 mg total) by mouth every 4 (four) hours as needed for pain. -     Discontinue: oxyCODONE (ROXICODONE) 15 MG immediate release tablet; Take 1 tablet (15 mg total) by mouth every 4 (four) hours as needed for pain. -     oxyCODONE (ROXICODONE) 15 MG immediate release tablet; Take 1 tablet (15 mg total) by mouth every 4 (four) hours as needed for pain.  Venous stasis ulcer, left (HCC) -     Discontinue: oxyCODONE (ROXICODONE) 15 MG immediate release tablet; Take 1 tablet (15 mg total) by mouth every 4 (four) hours as needed for pain. -     Discontinue: oxyCODONE (ROXICODONE) 15 MG immediate release tablet; Take 1 tablet (15 mg total) by mouth every 4 (four) hours as needed for pain. -     Discontinue: oxyCODONE (ROXICODONE) 15 MG immediate release tablet; Take 1 tablet (15 mg total) by mouth every 4 (four) hours as needed for pain. -     oxyCODONE (ROXICODONE) 15 MG immediate release tablet; Take 1 tablet (15 mg total) by mouth every 4 (four) hours as needed for pain.  Gastroesophageal reflux disease without esophagitis -     pantoprazole (PROTONIX) 40 MG tablet; Take 1 tablet (40 mg total) by mouth daily.  TOBACCO ABUSE -     Ambulatory Referral for Lung Cancer Scre  I have changed Brooke Garcia's pantoprazole. I am also having her start on Ipratropium-Albuterol. Additionally, I am having her maintain her SANTYL, irbesartan, rivaroxaban, chlorthalidone, ALPRAZolam, and oxyCODONE.  Meds ordered this encounter  Medications  . rivaroxaban (XARELTO) 20 MG TABS tablet    Sig: Take 1 tablet (20 mg total) by mouth daily with supper.    Dispense:  90 tablet    Refill:  1  . chlorthalidone (HYGROTON) 25 MG tablet    Sig: Take 1 tablet (25 mg total) by mouth daily.    Dispense:  90  tablet    Refill:  1  . DISCONTD: oxyCODONE (ROXICODONE) 15 MG immediate release tablet    Sig: Take 1 tablet (15 mg total) by mouth every 4 (four) hours as needed for pain.    Dispense:  120 tablet    Refill:  0    Fill on or after 03/18/16  . ALPRAZolam Duanne Moron)  1 MG tablet    Sig: Take 1 tablet (1 mg total) by mouth 2 (two) times daily as needed for anxiety.    Dispense:  60 tablet    Refill:  3  . DISCONTD: oxyCODONE (ROXICODONE) 15 MG immediate release tablet    Sig: Take 1 tablet (15 mg total) by mouth every 4 (four) hours as needed for pain.    Dispense:  120 tablet    Refill:  0    Fill on or after 04/18/16  . DISCONTD: oxyCODONE (ROXICODONE) 15 MG immediate release tablet    Sig: Take 1 tablet (15 mg total) by mouth every 4 (four) hours as needed for pain.    Dispense:  120 tablet    Refill:  0    Fill on or after 05/19/16  . oxyCODONE (ROXICODONE) 15 MG immediate release tablet    Sig: Take 1 tablet (15 mg total) by mouth every 4 (four) hours as needed for pain.    Dispense:  120 tablet    Refill:  0    Fill on or after 06/18/16  . Ipratropium-Albuterol (COMBIVENT RESPIMAT) 20-100 MCG/ACT AERS respimat    Sig: Inhale 1 puff into the lungs every 6 (six) hours.    Dispense:  4 g    Refill:  11  . pantoprazole (PROTONIX) 40 MG tablet    Sig: Take 1 tablet (40 mg total) by mouth daily.    Dispense:  90 tablet    Refill:  3     Follow-up: Return in about 4 months (around 07/19/2016).  Scarlette Calico, MD

## 2016-03-18 NOTE — Patient Instructions (Signed)
Hypertension Hypertension, commonly called high blood pressure, is when the force of blood pumping through your arteries is too strong. Your arteries are the blood vessels that carry blood from your heart throughout your body. A blood pressure reading consists of a higher number over a lower number, such as 110/72. The higher number (systolic) is the pressure inside your arteries when your heart pumps. The lower number (diastolic) is the pressure inside your arteries when your heart relaxes. Ideally you want your blood pressure below 120/80. Hypertension forces your heart to work harder to pump blood. Your arteries may become narrow or stiff. Having untreated or uncontrolled hypertension can cause heart attack, stroke, kidney disease, and other problems. RISK FACTORS Some risk factors for high blood pressure are controllable. Others are not.  Risk factors you cannot control include:   Race. You may be at higher risk if you are African American.  Age. Risk increases with age.  Gender. Men are at higher risk than women before age 45 years. After age 65, women are at higher risk than men. Risk factors you can control include:  Not getting enough exercise or physical activity.  Being overweight.  Getting too much fat, sugar, calories, or salt in your diet.  Drinking too much alcohol. SIGNS AND SYMPTOMS Hypertension does not usually cause signs or symptoms. Extremely high blood pressure (hypertensive crisis) may cause headache, anxiety, shortness of breath, and nosebleed. DIAGNOSIS To check if you have hypertension, your health care provider will measure your blood pressure while you are seated, with your arm held at the level of your heart. It should be measured at least twice using the same arm. Certain conditions can cause a difference in blood pressure between your right and left arms. A blood pressure reading that is higher than normal on one occasion does not mean that you need treatment. If  it is not clear whether you have high blood pressure, you may be asked to return on a different day to have your blood pressure checked again. Or, you may be asked to monitor your blood pressure at home for 1 or more weeks. TREATMENT Treating high blood pressure includes making lifestyle changes and possibly taking medicine. Living a healthy lifestyle can help lower high blood pressure. You may need to change some of your habits. Lifestyle changes may include:  Following the DASH diet. This diet is high in fruits, vegetables, and whole grains. It is low in salt, red meat, and added sugars.  Keep your sodium intake below 2,300 mg per day.  Getting at least 30-45 minutes of aerobic exercise at least 4 times per week.  Losing weight if necessary.  Not smoking.  Limiting alcoholic beverages.  Learning ways to reduce stress. Your health care provider may prescribe medicine if lifestyle changes are not enough to get your blood pressure under control, and if one of the following is true:  You are 18-59 years of age and your systolic blood pressure is above 140.  You are 60 years of age or older, and your systolic blood pressure is above 150.  Your diastolic blood pressure is above 90.  You have diabetes, and your systolic blood pressure is over 140 or your diastolic blood pressure is over 90.  You have kidney disease and your blood pressure is above 140/90.  You have heart disease and your blood pressure is above 140/90. Your personal target blood pressure may vary depending on your medical conditions, your age, and other factors. HOME CARE INSTRUCTIONS    Have your blood pressure rechecked as directed by your health care provider.   Take medicines only as directed by your health care provider. Follow the directions carefully. Blood pressure medicines must be taken as prescribed. The medicine does not work as well when you skip doses. Skipping doses also puts you at risk for  problems.  Do not smoke.   Monitor your blood pressure at home as directed by your health care provider. SEEK MEDICAL CARE IF:   You think you are having a reaction to medicines taken.  You have recurrent headaches or feel dizzy.  You have swelling in your ankles.  You have trouble with your vision. SEEK IMMEDIATE MEDICAL CARE IF:  You develop a severe headache or confusion.  You have unusual weakness, numbness, or feel faint.  You have severe chest or abdominal pain.  You vomit repeatedly.  You have trouble breathing. MAKE SURE YOU:   Understand these instructions.  Will watch your condition.  Will get help right away if you are not doing well or get worse.   This information is not intended to replace advice given to you by your health care provider. Make sure you discuss any questions you have with your health care provider.   Document Released: 08/30/2005 Document Revised: 01/14/2015 Document Reviewed: 06/22/2013 Elsevier Interactive Patient Education 2016 [employer]  

## 2016-03-19 ENCOUNTER — Encounter: Payer: Self-pay | Admitting: Internal Medicine

## 2016-03-24 ENCOUNTER — Ambulatory Visit: Payer: Medicare Other

## 2016-03-29 ENCOUNTER — Ambulatory Visit: Payer: Medicare Other | Admitting: Obstetrics and Gynecology

## 2016-05-03 ENCOUNTER — Encounter: Payer: Self-pay | Admitting: Vascular Surgery

## 2016-05-04 ENCOUNTER — Ambulatory Visit: Payer: Medicare Other | Admitting: Vascular Surgery

## 2016-05-04 ENCOUNTER — Encounter (HOSPITAL_COMMUNITY): Payer: Medicare Other

## 2016-05-06 ENCOUNTER — Telehealth: Payer: Self-pay | Admitting: Acute Care

## 2016-05-06 NOTE — Telephone Encounter (Signed)
Referral was canceled on 04/02/16 for lung cancer screening program due to lack of communication from pt. I sent notification to Dr. Stevenson Clinch office via fax on 04/02/16. Form was sent to medical records to be scanned. Received form back on 04/30/16 stating that form could not be scanned into chart. Nothing further needed.

## 2016-06-17 ENCOUNTER — Other Ambulatory Visit: Payer: Self-pay | Admitting: Internal Medicine

## 2016-07-15 ENCOUNTER — Other Ambulatory Visit (INDEPENDENT_AMBULATORY_CARE_PROVIDER_SITE_OTHER): Payer: Medicare Other

## 2016-07-15 ENCOUNTER — Encounter: Payer: Self-pay | Admitting: Internal Medicine

## 2016-07-15 ENCOUNTER — Ambulatory Visit (INDEPENDENT_AMBULATORY_CARE_PROVIDER_SITE_OTHER): Payer: Medicare Other | Admitting: Internal Medicine

## 2016-07-15 VITALS — BP 140/90 | HR 92 | Temp 98.2°F | Ht 63.0 in | Wt 187.0 lb

## 2016-07-15 DIAGNOSIS — R739 Hyperglycemia, unspecified: Secondary | ICD-10-CM

## 2016-07-15 DIAGNOSIS — M5412 Radiculopathy, cervical region: Secondary | ICD-10-CM

## 2016-07-15 DIAGNOSIS — B182 Chronic viral hepatitis C: Secondary | ICD-10-CM

## 2016-07-15 DIAGNOSIS — I10 Essential (primary) hypertension: Secondary | ICD-10-CM

## 2016-07-15 DIAGNOSIS — R7989 Other specified abnormal findings of blood chemistry: Secondary | ICD-10-CM

## 2016-07-15 DIAGNOSIS — L97922 Non-pressure chronic ulcer of unspecified part of left lower leg with fat layer exposed: Secondary | ICD-10-CM

## 2016-07-15 DIAGNOSIS — J410 Simple chronic bronchitis: Secondary | ICD-10-CM

## 2016-07-15 DIAGNOSIS — R945 Abnormal results of liver function studies: Secondary | ICD-10-CM

## 2016-07-15 DIAGNOSIS — M4722 Other spondylosis with radiculopathy, cervical region: Secondary | ICD-10-CM

## 2016-07-15 DIAGNOSIS — L97929 Non-pressure chronic ulcer of unspecified part of left lower leg with unspecified severity: Secondary | ICD-10-CM

## 2016-07-15 DIAGNOSIS — M4682 Other specified inflammatory spondylopathies, cervical region: Secondary | ICD-10-CM

## 2016-07-15 DIAGNOSIS — F418 Other specified anxiety disorders: Secondary | ICD-10-CM

## 2016-07-15 DIAGNOSIS — M4692 Unspecified inflammatory spondylopathy, cervical region: Secondary | ICD-10-CM

## 2016-07-15 DIAGNOSIS — Z23 Encounter for immunization: Secondary | ICD-10-CM

## 2016-07-15 LAB — CBC WITH DIFFERENTIAL/PLATELET
BASOS ABS: 0 10*3/uL (ref 0.0–0.1)
BASOS PCT: 0.4 % (ref 0.0–3.0)
EOS ABS: 0.3 10*3/uL (ref 0.0–0.7)
Eosinophils Relative: 3.2 % (ref 0.0–5.0)
HCT: 39.1 % (ref 36.0–46.0)
Hemoglobin: 13.4 g/dL (ref 12.0–15.0)
LYMPHS PCT: 31.7 % (ref 12.0–46.0)
Lymphs Abs: 2.8 10*3/uL (ref 0.7–4.0)
MCHC: 34.4 g/dL (ref 30.0–36.0)
MCV: 95.2 fl (ref 78.0–100.0)
MONO ABS: 0.1 10*3/uL (ref 0.1–1.0)
Monocytes Relative: 1 % — ABNORMAL LOW (ref 3.0–12.0)
NEUTROS PCT: 63.7 % (ref 43.0–77.0)
Neutro Abs: 5.6 10*3/uL (ref 1.4–7.7)
PLATELETS: 253 10*3/uL (ref 150.0–400.0)
RBC: 4.11 Mil/uL (ref 3.87–5.11)
RDW: 13.3 % (ref 11.5–15.5)
WBC: 8.8 10*3/uL (ref 4.0–10.5)

## 2016-07-15 LAB — HEPATIC FUNCTION PANEL
ALBUMIN: 4.6 g/dL (ref 3.5–5.2)
ALT: 24 U/L (ref 0–35)
AST: 26 U/L (ref 0–37)
Alkaline Phosphatase: 98 U/L (ref 39–117)
BILIRUBIN TOTAL: 0.6 mg/dL (ref 0.2–1.2)
Bilirubin, Direct: 0.2 mg/dL (ref 0.0–0.3)
Total Protein: 8.2 g/dL (ref 6.0–8.3)

## 2016-07-15 LAB — HEMOGLOBIN A1C: Hgb A1c MFr Bld: 4.4 % — ABNORMAL LOW (ref 4.6–6.5)

## 2016-07-15 LAB — BASIC METABOLIC PANEL
BUN: 17 mg/dL (ref 6–23)
CALCIUM: 9.8 mg/dL (ref 8.4–10.5)
CO2: 29 meq/L (ref 19–32)
CREATININE: 1.3 mg/dL — AB (ref 0.40–1.20)
Chloride: 100 mEq/L (ref 96–112)
GFR: 44.42 mL/min — ABNORMAL LOW (ref >60.00)
Glucose, Bld: 109 mg/dL — ABNORMAL HIGH (ref 70–99)
Potassium: 3.8 mEq/L (ref 3.5–5.1)
Sodium: 138 mEq/L (ref 135–145)

## 2016-07-15 MED ORDER — GLYCOPYRROLATE-FORMOTEROL 9-4.8 MCG/ACT IN AERO: 2.0000 | INHALATION_SPRAY | Freq: Two times a day (BID) | RESPIRATORY_TRACT | 11 refills | Status: DC

## 2016-07-15 MED ORDER — OXYCODONE HCL 15 MG PO TABS: 15.0000 mg | ORAL_TABLET | ORAL | 0 refills | Status: DC | PRN

## 2016-07-15 MED ORDER — ALPRAZOLAM 1 MG PO TABS: 1.0000 mg | ORAL_TABLET | Freq: Two times a day (BID) | ORAL | 3 refills | Status: DC | PRN

## 2016-07-15 MED ORDER — ARFORMOTEROL TARTRATE 15 MCG/2ML IN NEBU: 15.0000 ug | INHALATION_SOLUTION | Freq: Two times a day (BID) | RESPIRATORY_TRACT | 11 refills | Status: DC | PRN

## 2016-07-15 NOTE — Patient Instructions (Signed)

## 2016-07-15 NOTE — Progress Notes (Signed)
Subjective:  Patient ID: Brooke Garcia, female    DOB: Dec 18, 1955  Age: 60 y.o. MRN: [id number]  CC: Hypertension   HPI Brooke Garcia presents for follow-up on hypertension and treatment of chronic neck pain. She was previously diagnosed with cervical spondylosis with radiculopathy. She was referred to neurosurgery but never made an appointment. She controls the pain with oxycodone. She complains of an intermittent sharp and stabbing pain on the right side of her neck that radiates into the right upper extremity with numbness in her right upper extremity. She also has mild, nonradiating low back pain. She has no paresthesias in her lower extremities.  She continues to smoke and complains of intermittent cough productive of clear phlegm with wheezing. She denies shortness of breath, night sweats, hemoptysis, fever, chills. She has been using Combivent inhaler but has not had much improvement in her symptoms.  Outpatient Medications Prior to Visit  Medication Sig Dispense Refill  . chlorthalidone (HYGROTON) 25 MG tablet Take 1 tablet (25 mg total) by mouth daily. 90 tablet 1  . irbesartan (AVAPRO) 300 MG tablet TAKE 1 TABLET(300 MG) BY MOUTH DAILY 90 tablet 3  . pantoprazole (PROTONIX) 40 MG tablet Take 1 tablet (40 mg total) by mouth daily. 90 tablet 3  . rivaroxaban (XARELTO) 20 MG TABS tablet Take 1 tablet (20 mg total) by mouth daily with supper. 90 tablet 1  . ALPRAZolam (XANAX) 1 MG tablet Take 1 tablet (1 mg total) by mouth 2 (two) times daily as needed for anxiety. 60 tablet 3  . Ipratropium-Albuterol (COMBIVENT RESPIMAT) 20-100 MCG/ACT AERS respimat Inhale 1 puff into the lungs every 6 (six) hours. 4 g 11  . mirtazapine (REMERON) 30 MG tablet TAKE 1 TABLET BY MOUTH EVERY NIGHT AT BEDTIME 90 tablet 3  . oxyCODONE (ROXICODONE) 15 MG immediate release tablet Take 1 tablet (15 mg total) by mouth every 4 (four) hours as needed for pain. 120 tablet 0  . SANTYL ointment Apply 1 application  topically daily. Reported on 11/25/2015     No facility-administered medications prior to visit.     ROS Review of Systems  Constitutional: Negative for activity change, appetite change, chills, diaphoresis, fatigue, fever and unexpected weight change.  HENT: Negative.  Negative for sore throat and trouble swallowing.   Eyes: Negative for photophobia and visual disturbance.  Respiratory: Positive for cough and wheezing. Negative for choking, chest tightness, shortness of breath and stridor.   Cardiovascular: Negative.  Negative for chest pain, palpitations and leg swelling.  Gastrointestinal: Negative for abdominal pain, constipation, diarrhea, nausea and vomiting.  Endocrine: Negative.   Genitourinary: Negative.  Negative for difficulty urinating, dysuria, frequency, hematuria and urgency.  Musculoskeletal: Negative.  Negative for arthralgias, back pain, myalgias and neck pain.  Skin: Negative.  Negative for color change and rash.  Allergic/Immunologic: Negative.   Neurological: Negative.  Negative for dizziness.  Hematological: Negative for adenopathy. Does not bruise/bleed easily.  Psychiatric/Behavioral: Negative for behavioral problems, decreased concentration, dysphoric mood, hallucinations, sleep disturbance and suicidal ideas. The patient is nervous/anxious.     Objective:  BP 140/90 (BP Location: Left Arm, Patient Position: Sitting, Cuff Size: Normal)   Pulse 92   Temp 98.2 F (36.8 C) (Oral)   Ht 5\' 3"  (1.6 m)   Wt 187 lb (84.8 kg)   SpO2 94%   BMI 33.13 kg/m   BP Readings from Last 3 Encounters:  07/15/16 140/90  03/18/16 (!) 150/98  11/25/15 140/90    Wt Readings from  Last 3 Encounters:  07/15/16 187 lb (84.8 kg)  03/18/16 200 lb 12.8 oz (91.1 kg)  11/25/15 199 lb (90.3 kg)    Physical Exam  Constitutional: She is oriented to person, place, and time. No distress.  HENT:  Mouth/Throat: Oropharynx is clear and moist. No oropharyngeal exudate.  Eyes:  Conjunctivae are normal. Right eye exhibits no discharge. Left eye exhibits no discharge. No scleral icterus.  Neck: Normal range of motion. Neck supple. No JVD present. No tracheal deviation present. No thyromegaly present.  Cardiovascular: Normal rate, regular rhythm, normal heart sounds and intact distal pulses.  Exam reveals no gallop and no friction rub.   No murmur heard. Pulmonary/Chest: Effort normal and breath sounds normal. No stridor. No respiratory distress. She has no wheezes. She has no rales. She exhibits no tenderness.  Abdominal: Soft. Bowel sounds are normal. She exhibits no distension and no mass. There is no tenderness. There is no rebound and no guarding.  Musculoskeletal: Normal range of motion. She exhibits no edema, tenderness or deformity.  Lymphadenopathy:    She has no cervical adenopathy.  Neurological: She is oriented to person, place, and time.  Skin: Skin is warm and dry. No rash noted. She is not diaphoretic. No erythema. No pallor.  Psychiatric: She has a normal mood and affect. Her behavior is normal. Judgment and thought content normal.  Vitals reviewed.   Lab Results  Component Value Date   WBC 8.8 07/15/2016   HGB 13.4 07/15/2016   HCT 39.1 07/15/2016   PLT 253.0 07/15/2016   GLUCOSE 109 (H) 07/15/2016   CHOL 145 11/27/2015   TRIG 134.0 11/27/2015   HDL 40.90 11/27/2015   LDLCALC 77 11/27/2015   ALT 24 07/15/2016   AST 26 07/15/2016   NA 138 07/15/2016   K 3.8 07/15/2016   CL 100 07/15/2016   CREATININE 1.30 (H) 07/15/2016   BUN 17 07/15/2016   CO2 29 07/15/2016   TSH 1.40 11/27/2015   INR 1.72 (H) 09/08/2015   HGBA1C 4.4 (L) 07/15/2016    No results found.  Assessment & Plan:   Brooke Garcia was seen today for hypertension.  Diagnoses and all orders for this visit:  HYPERTENSION, BENIGN ESSENTIAL- her blood pressures adequately well-controlled, she has had a slight decrease in her renal function so Avastin to avoid nephrotoxic agents and  return soon for rechecking of her renal function. -     CBC with Differential/Platelet; Future -     Basic metabolic panel; Future  Elevated LFTs- she is infected with hepatitis C which explains her elevated LFTs, she has an old/prior infection with hepatitis B and has no immunity to hepatitis A. -     Hemoglobin A1c; Future -     Hepatic function panel; Future -     Hepatitis A antibody, total; Future -     Hepatitis B core antibody, total; Future -     Hepatitis B surface antibody; Future -     Hepatitis C antibody; Future  Hyperglycemia- improvement noted -     Basic metabolic panel; Future  Cervical spondylitis with radiculitis (HCC) -     Ambulatory referral to Neurosurgery -     Discontinue: oxyCODONE (ROXICODONE) 15 MG immediate release tablet; Take 1 tablet (15 mg total) by mouth every 4 (four) hours as needed for pain. -     Discontinue: oxyCODONE (ROXICODONE) 15 MG immediate release tablet; Take 1 tablet (15 mg total) by mouth every 4 (four) hours as needed for pain. -  oxyCODONE (ROXICODONE) 15 MG immediate release tablet; Take 1 tablet (15 mg total) by mouth every 4 (four) hours as needed for pain.  Depression with anxiety -     ALPRAZolam (XANAX) 1 MG tablet; Take 1 tablet (1 mg total) by mouth 2 (two) times daily as needed for anxiety.  Simple chronic bronchitis ([place])- will try a LAMA/LABA combination for better sx relief, I asked her to quit smoking, she will use brovana as needed for wheezing -     Glycopyrrolate-Formoterol (BEVESPI AEROSPHERE) 9-4.8 MCG/ACT AERO; Inhale 2 puffs into the lungs 2 (two) times daily. -     arformoterol (BROVANA) 15 MCG/2ML NEBU; Take 2 mLs (15 mcg total) by nebulization 2 (two) times daily as needed.  Leg ulcer, left, with unspecified severity ([place]) -     Discontinue: oxyCODONE (ROXICODONE) 15 MG immediate release tablet; Take 1 tablet (15 mg total) by mouth every 4 (four) hours as needed for pain. -     Discontinue: oxyCODONE  (ROXICODONE) 15 MG immediate release tablet; Take 1 tablet (15 mg total) by mouth every 4 (four) hours as needed for pain. -     oxyCODONE (ROXICODONE) 15 MG immediate release tablet; Take 1 tablet (15 mg total) by mouth every 4 (four) hours as needed for pain.  Leg ulcer, left, with fat layer exposed ([place]) -     Discontinue: oxyCODONE (ROXICODONE) 15 MG immediate release tablet; Take 1 tablet (15 mg total) by mouth every 4 (four) hours as needed for pain. -     Discontinue: oxyCODONE (ROXICODONE) 15 MG immediate release tablet; Take 1 tablet (15 mg total) by mouth every 4 (four) hours as needed for pain. -     oxyCODONE (ROXICODONE) 15 MG immediate release tablet; Take 1 tablet (15 mg total) by mouth every 4 (four) hours as needed for pain.  Need for prophylactic vaccination and inoculation against influenza -     Flu Vaccine QUAD 36+ mos IM  Chronic hepatitis C without hepatic coma (HCC) -     AMB referral to hepatitis C clinic   I have discontinued Ms. Remus's SANTYL, Ipratropium-Albuterol, and mirtazapine. I am also having her start on Glycopyrrolate-Formoterol and arformoterol. Additionally, I am having her maintain her irbesartan, rivaroxaban, chlorthalidone, pantoprazole, ALPRAZolam, and oxyCODONE.  Meds ordered this encounter  Medications  . ALPRAZolam (XANAX) 1 MG tablet    Sig: Take 1 tablet (1 mg total) by mouth 2 (two) times daily as needed for anxiety.    Dispense:  60 tablet    Refill:  3  . Glycopyrrolate-Formoterol (BEVESPI AEROSPHERE) 9-4.8 MCG/ACT AERO    Sig: Inhale 2 puffs into the lungs 2 (two) times daily.    Dispense:  10.7 g    Refill:  11  . arformoterol (BROVANA) 15 MCG/2ML NEBU    Sig: Take 2 mLs (15 mcg total) by nebulization 2 (two) times daily as needed.    Dispense:  120 mL    Refill:  11  . DISCONTD: oxyCODONE (ROXICODONE) 15 MG immediate release tablet    Sig: Take 1 tablet (15 mg total) by mouth every 4 (four) hours as needed for pain.    Dispense:   120 tablet    Refill:  0    Fill on or after 07/15/16  . DISCONTD: oxyCODONE (ROXICODONE) 15 MG immediate release tablet    Sig: Take 1 tablet (15 mg total) by mouth every 4 (four) hours as needed for pain.    Dispense:  120 tablet  Refill:  0    Fill on or after 08/14/16  . oxyCODONE (ROXICODONE) 15 MG immediate release tablet    Sig: Take 1 tablet (15 mg total) by mouth every 4 (four) hours as needed for pain.    Dispense:  120 tablet    Refill:  0    Fill on or after 09/14/16     Follow-up: Return in about 3 months (around 10/15/2016).  Scarlette Calico, MD

## 2016-07-15 NOTE — Progress Notes (Signed)
Pre visit review using our clinic review tool, if applicable. No additional management support is needed unless otherwise documented below in the visit note. 

## 2016-07-16 DIAGNOSIS — Z23 Encounter for immunization: Secondary | ICD-10-CM

## 2016-07-16 LAB — HEPATITIS B SURFACE ANTIBODY,QUALITATIVE: HEP B S AB: POSITIVE — AB

## 2016-07-16 LAB — HEPATITIS B CORE ANTIBODY, TOTAL: HEP B C TOTAL AB: REACTIVE — AB

## 2016-07-16 LAB — HEPATITIS A ANTIBODY, TOTAL: HEP A TOTAL AB: NONREACTIVE

## 2016-07-16 LAB — HEPATITIS C ANTIBODY: HCV Ab: REACTIVE — AB

## 2016-07-20 LAB — HEPATITIS C RNA QUANTITATIVE
HCV QUANT LOG: 6.49 {Log} — AB (ref <1.18)
HCV Quantitative: 3119280 IU/mL — ABNORMAL HIGH (ref <15)

## 2016-07-21 ENCOUNTER — Encounter: Payer: Self-pay | Admitting: Internal Medicine

## 2016-07-21 DIAGNOSIS — B182 Chronic viral hepatitis C: Secondary | ICD-10-CM | POA: Insufficient documentation

## 2016-08-02 ENCOUNTER — Telehealth: Payer: Self-pay | Admitting: Internal Medicine

## 2016-08-02 NOTE — Telephone Encounter (Signed)
Both referrals were placed: [facility] and Neurosurgery

## 2016-08-02 NOTE — Telephone Encounter (Signed)
Patient called back in. You were not at your desk.   Advised of pending referral to [facility] liver [facility]. She understood.    She also states that she wants to know if we are going to hold off on the neuro surgery referral until she sees the liver doctor.

## 2016-08-02 NOTE — Telephone Encounter (Signed)
[facility] [facility] [place] [facility]  Patient Name: Brooke Garcia [facility]  DOB: 1956-08-08    Initial Comment Caller states tested pos for Hep C, she is asking for more info on tx options   Nurse Assessment  Nurse: Wayne Sever, RN, Tillie Rung Date/Time (Eastern Time): 08/02/2016 11:43:07 AM  Confirm and document reason for call. If symptomatic, describe symptoms. You must click the next button to save text entered. ---Caller states she tested positive for Hepatitis C and she has been referred out to a specialist. She states she is upset about the test results. She wants to know to do and how bad it is. She states she does not know much about the disease and wants to know more about the disease and who she got referred to. Caller has a lot of questions about her diagnosis and who her new doctor will be. She is also concerned about having Medicaid and not getting the right doctor. She denied wanting appointment with Dr Ronnald Ramp at this time. See comments about wanting some questions answered about referrals.  Has the patient traveled out of the country within the last 30 days? ---Not Applicable  Does the patient have any new or worsening symptoms? ---No     Guidelines    Guideline Title Affirmed Question Affirmed Notes       Final Disposition User        Comments  Can you call patient and let her know what MD she was referred to? She wants to know if it's a GI doctor because she already has one? Can you answer her questions and then she will decide about follow up?

## 2016-08-02 NOTE — Telephone Encounter (Signed)
LVM for pt to call back as soon as possible.   

## 2016-08-02 NOTE — Telephone Encounter (Signed)
Pt informed of both referrals. Explained the tests and what they meant. Pt had questions about treatment. Informed that [facility] would be able to answer that question better than I.  Pt will schedule a nurse visit to get her first Hep A.

## 2016-09-08 DIAGNOSIS — H2513 Age-related nuclear cataract, bilateral: Secondary | ICD-10-CM | POA: Diagnosis not present

## 2016-09-13 DIAGNOSIS — Z79899 Other long term (current) drug therapy: Secondary | ICD-10-CM | POA: Diagnosis not present

## 2016-09-13 DIAGNOSIS — R569 Unspecified convulsions: Secondary | ICD-10-CM | POA: Diagnosis not present

## 2016-09-13 DIAGNOSIS — I6783 Posterior reversible encephalopathy syndrome: Secondary | ICD-10-CM | POA: Diagnosis not present

## 2016-09-13 DIAGNOSIS — N179 Acute kidney failure, unspecified: Secondary | ICD-10-CM | POA: Diagnosis not present

## 2016-09-13 DIAGNOSIS — R9089 Other abnormal findings on diagnostic imaging of central nervous system: Secondary | ICD-10-CM | POA: Diagnosis not present

## 2016-09-13 DIAGNOSIS — Z789 Other specified health status: Secondary | ICD-10-CM | POA: Diagnosis not present

## 2016-09-13 DIAGNOSIS — G936 Cerebral edema: Secondary | ICD-10-CM | POA: Diagnosis not present

## 2016-09-14 DIAGNOSIS — J9811 Atelectasis: Secondary | ICD-10-CM | POA: Diagnosis not present

## 2016-09-14 DIAGNOSIS — I6783 Posterior reversible encephalopathy syndrome: Secondary | ICD-10-CM | POA: Diagnosis not present

## 2016-09-14 DIAGNOSIS — R0602 Shortness of breath: Secondary | ICD-10-CM | POA: Diagnosis not present

## 2016-09-15 DIAGNOSIS — I6783 Posterior reversible encephalopathy syndrome: Secondary | ICD-10-CM | POA: Diagnosis not present

## 2016-09-16 DIAGNOSIS — K8020 Calculus of gallbladder without cholecystitis without obstruction: Secondary | ICD-10-CM | POA: Diagnosis not present

## 2016-09-16 DIAGNOSIS — I712 Thoracic aortic aneurysm, without rupture: Secondary | ICD-10-CM | POA: Diagnosis not present

## 2016-10-05 ENCOUNTER — Encounter: Payer: Self-pay | Admitting: Internal Medicine

## 2016-10-05 ENCOUNTER — Telehealth: Payer: Self-pay | Admitting: Internal Medicine

## 2016-10-14 ENCOUNTER — Ambulatory Visit: Payer: Medicare Other | Admitting: Internal Medicine

## 2016-10-14 ENCOUNTER — Telehealth: Payer: Self-pay | Admitting: Internal Medicine

## 2016-10-14 NOTE — Telephone Encounter (Signed)
Pt is in a lot of pain and is wondering if Dr. Ronnald Ramp can call her in something before her appt 2/8, please call pt, MS

## 2016-10-15 ENCOUNTER — Other Ambulatory Visit: Payer: Self-pay | Admitting: Internal Medicine

## 2016-10-15 DIAGNOSIS — M4692 Unspecified inflammatory spondylopathy, cervical region: Secondary | ICD-10-CM

## 2016-10-15 DIAGNOSIS — M5412 Radiculopathy, cervical region: Principal | ICD-10-CM

## 2016-10-15 DIAGNOSIS — L97922 Non-pressure chronic ulcer of unspecified part of left lower leg with fat layer exposed: Secondary | ICD-10-CM

## 2016-10-15 DIAGNOSIS — L97929 Non-pressure chronic ulcer of unspecified part of left lower leg with unspecified severity: Secondary | ICD-10-CM

## 2016-10-15 MED ORDER — OXYCODONE HCL 15 MG PO TABS: 15.0000 mg | ORAL_TABLET | ORAL | 0 refills | Status: DC | PRN

## 2016-10-15 NOTE — Telephone Encounter (Signed)
Pt informed rx is ready for pick up

## 2016-10-19 ENCOUNTER — Ambulatory Visit: Payer: Medicare Other | Admitting: Internal Medicine

## 2016-10-21 ENCOUNTER — Ambulatory Visit: Payer: Medicare Other | Admitting: Internal Medicine

## 2016-10-25 ENCOUNTER — Encounter: Payer: Self-pay | Admitting: Internal Medicine

## 2016-10-25 ENCOUNTER — Ambulatory Visit (INDEPENDENT_AMBULATORY_CARE_PROVIDER_SITE_OTHER): Payer: Medicare Other | Admitting: Internal Medicine

## 2016-10-25 ENCOUNTER — Other Ambulatory Visit (INDEPENDENT_AMBULATORY_CARE_PROVIDER_SITE_OTHER): Payer: Medicare Other

## 2016-10-25 VITALS — BP 140/80 | HR 87 | Temp 98.8°F | Resp 16 | Wt 181.2 lb

## 2016-10-25 DIAGNOSIS — L97929 Non-pressure chronic ulcer of unspecified part of left lower leg with unspecified severity: Secondary | ICD-10-CM | POA: Diagnosis not present

## 2016-10-25 DIAGNOSIS — N8110 Cystocele, unspecified: Secondary | ICD-10-CM | POA: Diagnosis not present

## 2016-10-25 DIAGNOSIS — I10 Essential (primary) hypertension: Secondary | ICD-10-CM

## 2016-10-25 DIAGNOSIS — I2699 Other pulmonary embolism without acute cor pulmonale: Secondary | ICD-10-CM

## 2016-10-25 DIAGNOSIS — M5412 Radiculopathy, cervical region: Secondary | ICD-10-CM

## 2016-10-25 DIAGNOSIS — M4722 Other spondylosis with radiculopathy, cervical region: Secondary | ICD-10-CM | POA: Diagnosis not present

## 2016-10-25 DIAGNOSIS — D6859 Other primary thrombophilia: Secondary | ICD-10-CM

## 2016-10-25 DIAGNOSIS — L97922 Non-pressure chronic ulcer of unspecified part of left lower leg with fat layer exposed: Secondary | ICD-10-CM | POA: Diagnosis not present

## 2016-10-25 DIAGNOSIS — M4692 Unspecified inflammatory spondylopathy, cervical region: Secondary | ICD-10-CM

## 2016-10-25 DIAGNOSIS — F418 Other specified anxiety disorders: Secondary | ICD-10-CM

## 2016-10-25 DIAGNOSIS — M4682 Other specified inflammatory spondylopathies, cervical region: Secondary | ICD-10-CM

## 2016-10-25 DIAGNOSIS — I825Y3 Chronic embolism and thrombosis of unspecified deep veins of proximal lower extremity, bilateral: Secondary | ICD-10-CM

## 2016-10-25 LAB — BASIC METABOLIC PANEL
BUN: 14 mg/dL (ref 6–23)
CO2: 27 mEq/L (ref 19–32)
Calcium: 9.5 mg/dL (ref 8.4–10.5)
Chloride: 103 mEq/L (ref 96–112)
Creatinine, Ser: 1.04 mg/dL (ref 0.40–1.20)
GFR: 57.41 mL/min — AB (ref >60.00)
Glucose, Bld: 108 mg/dL — ABNORMAL HIGH (ref 70–99)
POTASSIUM: 4.5 meq/L (ref 3.5–5.1)
Sodium: 136 mEq/L (ref 135–145)

## 2016-10-25 MED ORDER — OXYCODONE HCL 15 MG PO TABS: 15.0000 mg | ORAL_TABLET | ORAL | 0 refills | Status: DC | PRN

## 2016-10-25 MED ORDER — RIVAROXABAN 15 MG PO TABS: 15.0000 mg | ORAL_TABLET | Freq: Every day | ORAL | 1 refills | Status: DC

## 2016-10-25 MED ORDER — ALPRAZOLAM 1 MG PO TABS: 1.0000 mg | ORAL_TABLET | Freq: Two times a day (BID) | ORAL | 3 refills | Status: DC | PRN

## 2016-10-25 NOTE — Progress Notes (Signed)
Pre visit review using our clinic review tool, if applicable. No additional management support is needed unless otherwise documented below in the visit note. 

## 2016-10-25 NOTE — Patient Instructions (Signed)
Hypertension Hypertension, commonly called high blood pressure, is when the force of blood pumping through your arteries is too strong. Your arteries are the blood vessels that carry blood from your heart throughout your body. A blood pressure reading consists of a higher number over a lower number, such as 110/72. The higher number (systolic) is the pressure inside your arteries when your heart pumps. The lower number (diastolic) is the pressure inside your arteries when your heart relaxes. Ideally you want your blood pressure below 120/80. Hypertension forces your heart to work harder to pump blood. Your arteries may become narrow or stiff. Having untreated or uncontrolled hypertension can cause heart attack, stroke, kidney disease, and other problems. What increases the risk? Some risk factors for high blood pressure are controllable. Others are not. Risk factors you cannot control include:  Race. You may be at higher risk if you are African American.  Age. Risk increases with age.  Gender. Men are at higher risk than women before age 45 years. After age 65, women are at higher risk than men. Risk factors you can control include:  Not getting enough exercise or physical activity.  Being overweight.  Getting too much fat, sugar, calories, or salt in your diet.  Drinking too much alcohol. What are the signs or symptoms? Hypertension does not usually cause signs or symptoms. Extremely high blood pressure (hypertensive crisis) may cause headache, anxiety, shortness of breath, and nosebleed. How is this diagnosed? To check if you have hypertension, your health care provider will measure your blood pressure while you are seated, with your arm held at the level of your heart. It should be measured at least twice using the same arm. Certain conditions can cause a difference in blood pressure between your right and left arms. A blood pressure reading that is higher than normal on one occasion does  not mean that you need treatment. If it is not clear whether you have high blood pressure, you may be asked to return on a different day to have your blood pressure checked again. Or, you may be asked to monitor your blood pressure at home for 1 or more weeks. How is this treated? Treating high blood pressure includes making lifestyle changes and possibly taking medicine. Living a healthy lifestyle can help lower high blood pressure. You may need to change some of your habits. Lifestyle changes may include:  Following the DASH diet. This diet is high in fruits, vegetables, and whole grains. It is low in salt, red meat, and added sugars.  Keep your sodium intake below 2,300 mg per day.  Getting at least 30-45 minutes of aerobic exercise at least 4 times per week.  Losing weight if necessary.  Not smoking.  Limiting alcoholic beverages.  Learning ways to reduce stress. Your health care provider may prescribe medicine if lifestyle changes are not enough to get your blood pressure under control, and if one of the following is true:  You are 18-59 years of age and your systolic blood pressure is above 140.  You are 60 years of age or older, and your systolic blood pressure is above 150.  Your diastolic blood pressure is above 90.  You have diabetes, and your systolic blood pressure is over 140 or your diastolic blood pressure is over 90.  You have kidney disease and your blood pressure is above 140/90.  You have heart disease and your blood pressure is above 140/90. Your personal target blood pressure may vary depending on your medical   conditions, your age, and other factors. Follow these instructions at home:  Have your blood pressure rechecked as directed by your health care provider.  Take medicines only as directed by your health care provider. Follow the directions carefully. Blood pressure medicines must be taken as prescribed. The medicine does not work as well when you skip  doses. Skipping doses also puts you at risk for problems.  Do not smoke.  Monitor your blood pressure at home as directed by your health care provider. Contact a health care provider if:  You think you are having a reaction to medicines taken.  You have recurrent headaches or feel dizzy.  You have swelling in your ankles.  You have trouble with your vision. Get help right away if:  You develop a severe headache or confusion.  You have unusual weakness, numbness, or feel faint.  You have severe chest or abdominal pain.  You vomit repeatedly.  You have trouble breathing. This information is not intended to replace advice given to you by your health care provider. Make sure you discuss any questions you have with your health care provider. Document Released: 08/30/2005 Document Revised: 02/05/2016 Document Reviewed: 06/22/2013 Elsevier Interactive Patient Education  2017 [employer] Inc[employer]  

## 2016-10-25 NOTE — Progress Notes (Signed)
Subjective:  Patient ID: Brooke Garcia, female    DOB: Jan 16, 1956  Age: 61 y.o. MRN: [id number]  CC: Hypertension   HPI Brooke Garcia presents for a blood pressure check and refill on pain medications. She complains for 3 weeks that there is something protruding from her vagina and she is afraid that it might be her bladder. She is having trouble controlling her urine flow - sometimes the urine flow is profuse and sometimes the urine flow is difficult to produce. She denies abdominal pain, nausea, vomiting, dysuria, or hematuria. When I last saw her she was referred to neurosurgery for management of cervical spinal stenosis. She hasn't made an appointment yet and needs another referral. She was also diagnosed with chronic hepatitis C infection and was referred to the hepatitis C clinic. She has elected not to see them yet.   Outpatient Medications Prior to Visit  Medication Sig Dispense Refill  . arformoterol (BROVANA) 15 MCG/2ML NEBU Take 2 mLs (15 mcg total) by nebulization 2 (two) times daily as needed. 120 mL 11  . chlorthalidone (HYGROTON) 25 MG tablet Take 1 tablet (25 mg total) by mouth daily. 90 tablet 1  . Glycopyrrolate-Formoterol (BEVESPI AEROSPHERE) 9-4.8 MCG/ACT AERO Inhale 2 puffs into the lungs 2 (two) times daily. 10.7 g 11  . irbesartan (AVAPRO) 300 MG tablet TAKE 1 TABLET(300 MG) BY MOUTH DAILY 90 tablet 3  . pantoprazole (PROTONIX) 40 MG tablet Take 1 tablet (40 mg total) by mouth daily. 90 tablet 3  . ALPRAZolam (XANAX) 1 MG tablet Take 1 tablet (1 mg total) by mouth 2 (two) times daily as needed for anxiety. 60 tablet 3  . oxyCODONE (ROXICODONE) 15 MG immediate release tablet Take 1 tablet (15 mg total) by mouth every 4 (four) hours as needed for pain. 120 tablet 0  . rivaroxaban (XARELTO) 20 MG TABS tablet Take 1 tablet (20 mg total) by mouth daily with supper. 90 tablet 1   No facility-administered medications prior to visit.     ROS Review of Systems    Constitutional: Negative for chills, diaphoresis, fatigue and unexpected weight change.  HENT: Negative.   Eyes: Negative for visual disturbance.  Respiratory: Positive for cough and wheezing. Negative for choking, chest tightness, shortness of breath and stridor.   Cardiovascular: Negative for chest pain, palpitations and leg swelling.  Gastrointestinal: Negative for abdominal pain, constipation, diarrhea and nausea.  Endocrine: Negative.   Genitourinary: Positive for difficulty urinating. Negative for decreased urine volume, dyspareunia, dysuria, flank pain, frequency, menstrual problem, pelvic pain, urgency, vaginal bleeding and vaginal discharge.  Musculoskeletal: Positive for arthralgias and neck pain. Negative for back pain, gait problem and neck stiffness.  Skin: Negative.   Allergic/Immunologic: Negative.   Neurological: Negative.  Negative for dizziness, weakness, light-headedness, numbness and headaches.  Hematological: Negative.  Negative for adenopathy. Does not bruise/bleed easily.  Psychiatric/Behavioral: Negative for behavioral problems, confusion, decreased concentration, dysphoric mood, sleep disturbance and suicidal ideas. The patient is nervous/anxious.     Objective:  BP 140/80   Pulse 87   Temp 98.8 F (37.1 C) (Oral)   Resp 16   Wt 181 lb 4 oz (82.2 kg)   SpO2 94%   BMI 32.11 kg/m   BP Readings from Last 3 Encounters:  10/25/16 140/80  07/15/16 140/90  03/18/16 (!) 150/98    Wt Readings from Last 3 Encounters:  10/25/16 181 lb 4 oz (82.2 kg)  07/15/16 187 lb (84.8 kg)  03/18/16 200 lb 12.8 oz (91.1  kg)    Physical Exam  Constitutional: She is oriented to person, place, and time. No distress.  HENT:  Mouth/Throat: Oropharynx is clear and moist. No oropharyngeal exudate.  Eyes: Conjunctivae are normal. Right eye exhibits no discharge. Left eye exhibits no discharge. No scleral icterus.  Neck: Normal range of motion. No JVD present. No tracheal  deviation present. No thyromegaly present.  Cardiovascular: Normal rate, regular rhythm, normal heart sounds and intact distal pulses.  Exam reveals no gallop and no friction rub.   No murmur heard. Pulmonary/Chest: Effort normal and breath sounds normal. No stridor. No respiratory distress. She has no wheezes. She has no rales. She exhibits no tenderness.  Abdominal: Soft. Bowel sounds are normal. She exhibits no distension and no mass. There is no tenderness. There is no rebound and no guarding. Hernia confirmed negative in the right inguinal area and confirmed negative in the left inguinal area.  Genitourinary: Vagina normal. Rectal exam shows no external hemorrhoid, no internal hemorrhoid, no fissure, no mass, no tenderness, anal tone normal and guaiac negative stool. No breast swelling, tenderness, discharge or bleeding. Pelvic exam was performed with patient supine. No labial fusion. There is no rash, tenderness, lesion or injury on the right labia. There is no rash, tenderness, lesion or injury on the left labia. Uterus is deviated. Uterus is not enlarged, not fixed and not tender. Cervix exhibits no motion tenderness, no discharge and no friability. Right adnexum displays no mass, no tenderness and no fullness. Left adnexum displays no mass, no tenderness and no fullness. No erythema, tenderness or bleeding in the vagina. No foreign body in the vagina. No signs of injury around the vagina. No vaginal discharge found.  Genitourinary Comments: Small amount of bladder and urethra has prolapsed into the anterior vaginal vault  Musculoskeletal: Normal range of motion. She exhibits no edema, tenderness or deformity.  Lymphadenopathy:    She has no cervical adenopathy.       Right: No inguinal adenopathy present.       Left: No inguinal adenopathy present.  Neurological: She is oriented to person, place, and time.  Skin: Skin is warm and dry. No rash noted. She is not diaphoretic. No erythema. No  pallor.  Vitals reviewed.   Lab Results  Component Value Date   WBC 8.8 07/15/2016   HGB 13.4 07/15/2016   HCT 39.1 07/15/2016   PLT 253.0 07/15/2016   GLUCOSE 109 (H) 07/15/2016   CHOL 145 11/27/2015   TRIG 134.0 11/27/2015   HDL 40.90 11/27/2015   LDLCALC 77 11/27/2015   ALT 24 07/15/2016   AST 26 07/15/2016   NA 138 07/15/2016   K 3.8 07/15/2016   CL 100 07/15/2016   CREATININE 1.30 (H) 07/15/2016   BUN 17 07/15/2016   CO2 29 07/15/2016   TSH 1.40 11/27/2015   INR 1.72 (H) 09/08/2015   HGBA1C 4.4 (L) 07/15/2016    No results found.  Assessment & Plan:   Ashantii was seen today for hypertension.  Diagnoses and all orders for this visit:  Bladder prolapse, female, acquired -     Ambulatory referral to Urology  Cervical spondylitis with radiculitis ([place]) -     Ambulatory referral to Neurosurgery -     Discontinue: oxyCODONE (ROXICODONE) 15 MG immediate release tablet; Take 1 tablet (15 mg total) by mouth every 4 (four) hours as needed for pain. -     Discontinue: oxyCODONE (ROXICODONE) 15 MG immediate release tablet; Take 1 tablet (15 mg total) by  mouth every 4 (four) hours as needed for pain. -     Discontinue: oxyCODONE (ROXICODONE) 15 MG immediate release tablet; Take 1 tablet (15 mg total) by mouth every 4 (four) hours as needed for pain. -     oxyCODONE (ROXICODONE) 15 MG immediate release tablet; Take 1 tablet (15 mg total) by mouth every 4 (four) hours as needed for pain.  Leg ulcer, left, with unspecified severity ([place]) -     Discontinue: oxyCODONE (ROXICODONE) 15 MG immediate release tablet; Take 1 tablet (15 mg total) by mouth every 4 (four) hours as needed for pain. -     Discontinue: oxyCODONE (ROXICODONE) 15 MG immediate release tablet; Take 1 tablet (15 mg total) by mouth every 4 (four) hours as needed for pain. -     Discontinue: oxyCODONE (ROXICODONE) 15 MG immediate release tablet; Take 1 tablet (15 mg total) by mouth every 4 (four) hours as needed for  pain. -     oxyCODONE (ROXICODONE) 15 MG immediate release tablet; Take 1 tablet (15 mg total) by mouth every 4 (four) hours as needed for pain.  Leg ulcer, left, with fat layer exposed ([place]) -     Discontinue: oxyCODONE (ROXICODONE) 15 MG immediate release tablet; Take 1 tablet (15 mg total) by mouth every 4 (four) hours as needed for pain. -     Discontinue: oxyCODONE (ROXICODONE) 15 MG immediate release tablet; Take 1 tablet (15 mg total) by mouth every 4 (four) hours as needed for pain. -     Discontinue: oxyCODONE (ROXICODONE) 15 MG immediate release tablet; Take 1 tablet (15 mg total) by mouth every 4 (four) hours as needed for pain. -     oxyCODONE (ROXICODONE) 15 MG immediate release tablet; Take 1 tablet (15 mg total) by mouth every 4 (four) hours as needed for pain.  HYPERTENSION, BENIGN ESSENTIAL- her blood pressure is well-controlled, electrolytes and renal function are stable. -     Basic metabolic panel; Future -     Urinalysis, Routine w reflex microscopic; Future  Chronic deep vein thrombosis (DVT) of proximal vein of both lower extremities ([facility]) -     Rivaroxaban (XARELTO) 15 MG TABS tablet; Take 1 tablet (15 mg total) by mouth daily with supper.  Other acute pulmonary embolism without acute cor pulmonale ([facility]) -     Rivaroxaban (XARELTO) 15 MG TABS tablet; Take 1 tablet (15 mg total) by mouth daily with supper.  HYPERCOAGULABLE STATE, PRIMARY- her recent GFR was down to 44.42 so I have recommended that she decrease her Xarelto dose from 20 mg a day to 15 mg a day. Will monitor her renal function again today. -     Rivaroxaban (XARELTO) 15 MG TABS tablet; Take 1 tablet (15 mg total) by mouth daily with supper.  Depression with anxiety -     ALPRAZolam (XANAX) 1 MG tablet; Take 1 tablet (1 mg total) by mouth 2 (two) times daily as needed for anxiety.   I have discontinued Ms. Gola's rivaroxaban. I am also having her start on Rivaroxaban. Additionally, I am having her  maintain her irbesartan, chlorthalidone, pantoprazole, Glycopyrrolate-Formoterol, arformoterol, oxyCODONE, and ALPRAZolam.  Meds ordered this encounter  Medications  . DISCONTD: oxyCODONE (ROXICODONE) 15 MG immediate release tablet    Sig: Take 1 tablet (15 mg total) by mouth every 4 (four) hours as needed for pain.    Dispense:  120 tablet    Refill:  0    Fill on or after 11/12/16  . DISCONTD:  oxyCODONE (ROXICODONE) 15 MG immediate release tablet    Sig: Take 1 tablet (15 mg total) by mouth every 4 (four) hours as needed for pain.    Dispense:  120 tablet    Refill:  0    Fill on or after 12/13/16  . DISCONTD: oxyCODONE (ROXICODONE) 15 MG immediate release tablet    Sig: Take 1 tablet (15 mg total) by mouth every 4 (four) hours as needed for pain.    Dispense:  120 tablet    Refill:  0    Fill on or after 01/12/17  . oxyCODONE (ROXICODONE) 15 MG immediate release tablet    Sig: Take 1 tablet (15 mg total) by mouth every 4 (four) hours as needed for pain.    Dispense:  120 tablet    Refill:  0    Fill on or after 02/12/17  . Rivaroxaban (XARELTO) 15 MG TABS tablet    Sig: Take 1 tablet (15 mg total) by mouth daily with supper.    Dispense:  90 tablet    Refill:  1  . ALPRAZolam (XANAX) 1 MG tablet    Sig: Take 1 tablet (1 mg total) by mouth 2 (two) times daily as needed for anxiety.    Dispense:  60 tablet    Refill:  3     Follow-up: Return in about 4 months (around 02/22/2017).  Scarlette Calico, MD

## 2016-11-15 DIAGNOSIS — M4722 Other spondylosis with radiculopathy, cervical region: Secondary | ICD-10-CM | POA: Diagnosis not present

## 2016-11-15 DIAGNOSIS — Z6833 Body mass index (BMI) 33.0-33.9, adult: Secondary | ICD-10-CM | POA: Diagnosis not present

## 2016-11-17 ENCOUNTER — Other Ambulatory Visit: Payer: Self-pay | Admitting: Neurosurgery

## 2016-11-17 DIAGNOSIS — M4722 Other spondylosis with radiculopathy, cervical region: Secondary | ICD-10-CM

## 2016-11-17 DIAGNOSIS — M48062 Spinal stenosis, lumbar region with neurogenic claudication: Secondary | ICD-10-CM

## 2016-11-17 DIAGNOSIS — G9519 Other vascular myelopathies: Secondary | ICD-10-CM

## 2016-12-03 ENCOUNTER — Other Ambulatory Visit: Payer: Medicare Other

## 2016-12-06 DIAGNOSIS — N816 Rectocele: Secondary | ICD-10-CM | POA: Diagnosis not present

## 2016-12-06 DIAGNOSIS — N8111 Cystocele, midline: Secondary | ICD-10-CM | POA: Diagnosis not present

## 2016-12-06 DIAGNOSIS — R350 Frequency of micturition: Secondary | ICD-10-CM | POA: Diagnosis not present

## 2016-12-06 DIAGNOSIS — N3946 Mixed incontinence: Secondary | ICD-10-CM | POA: Diagnosis not present

## 2016-12-06 DIAGNOSIS — R351 Nocturia: Secondary | ICD-10-CM | POA: Diagnosis not present

## 2016-12-18 ENCOUNTER — Other Ambulatory Visit: Payer: Self-pay | Admitting: Internal Medicine

## 2016-12-21 ENCOUNTER — Other Ambulatory Visit: Payer: Self-pay | Admitting: Internal Medicine

## 2016-12-21 DIAGNOSIS — K219 Gastro-esophageal reflux disease without esophagitis: Secondary | ICD-10-CM

## 2016-12-21 DIAGNOSIS — I10 Essential (primary) hypertension: Secondary | ICD-10-CM

## 2016-12-29 ENCOUNTER — Other Ambulatory Visit: Payer: Medicare Other

## 2017-01-12 ENCOUNTER — Telehealth: Payer: Self-pay | Admitting: Internal Medicine

## 2017-01-12 NOTE — Telephone Encounter (Signed)
Patient called in stating that oxycodone requires a PA.  States [facility] on [address] tried to contact the office about this.  Did not see phone note.  Did tell patient that fax has been down off and on for two weeks.  States that her refill is due today and she has a little anxiety about getting this refill today.  Requesting call back in regard. Did suggest to patient she could pay out of pocket for the medication.

## 2017-01-12 NOTE — Telephone Encounter (Signed)
Noted closing note

## 2017-01-12 NOTE — Telephone Encounter (Signed)
Patient called and said never mind about all this. They had put it under the wrong insurance.

## 2017-01-14 ENCOUNTER — Ambulatory Visit
Admission: RE | Admit: 2017-01-14 | Discharge: 2017-01-14 | Disposition: A | Discharge status: Home / Self Care | Payer: Medicare Other | Source: Ambulatory Visit | Attending: Neurosurgery | Admitting: Neurosurgery

## 2017-01-14 DIAGNOSIS — M4722 Other spondylosis with radiculopathy, cervical region: Secondary | ICD-10-CM

## 2017-01-14 DIAGNOSIS — M5126 Other intervertebral disc displacement, lumbar region: Secondary | ICD-10-CM | POA: Diagnosis not present

## 2017-01-14 DIAGNOSIS — M48062 Spinal stenosis, lumbar region with neurogenic claudication: Secondary | ICD-10-CM

## 2017-01-14 DIAGNOSIS — G9519 Other vascular myelopathies: Secondary | ICD-10-CM

## 2017-01-14 DIAGNOSIS — M4802 Spinal stenosis, cervical region: Secondary | ICD-10-CM | POA: Diagnosis not present

## 2017-01-18 DIAGNOSIS — M4722 Other spondylosis with radiculopathy, cervical region: Secondary | ICD-10-CM | POA: Diagnosis not present

## 2017-01-18 DIAGNOSIS — M48062 Spinal stenosis, lumbar region with neurogenic claudication: Secondary | ICD-10-CM | POA: Diagnosis not present

## 2017-01-18 LAB — HM PAP SMEAR

## 2017-01-24 ENCOUNTER — Other Ambulatory Visit: Payer: Self-pay | Admitting: Obstetrics and Gynecology

## 2017-02-19 IMAGING — CT CT ANGIO CHEST
2 of 6 series · 18 of 36 positions shown · IV contrast (OMNIPAQUE 350)
Comparison: December 06, 2007

CLINICAL DATA: Right lower extremity redness and swelling. Patient
has a history of DVT MP.

EXAM:
CT ANGIOGRAPHY CHEST WITH CONTRAST
TECHNIQUE: Multidetector CT imaging of the chest was performed using the
standard protocol during bolus administration of intravenous
contrast. Multiplanar CT image reconstructions and MIPs were
obtained to evaluate the vascular anatomy.
CONTRAST:  150mL OMNIPAQUE IOHEXOL 350 MG/ML SOLN

[Series 5: coronal mpr · coronal · 0.52mm/px · 1 of 127 slices shown]
[im 64/127  mediastinal]
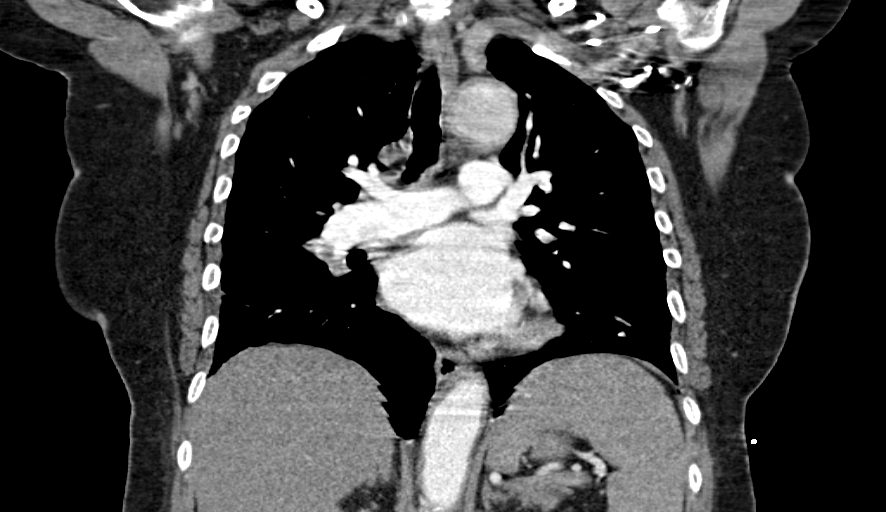

[Series 10: thins for pacs · axial · 0.74mm/px · z∈[-184,+58]mm · 17 of 269 slices shown]
[im 14/269  lung]
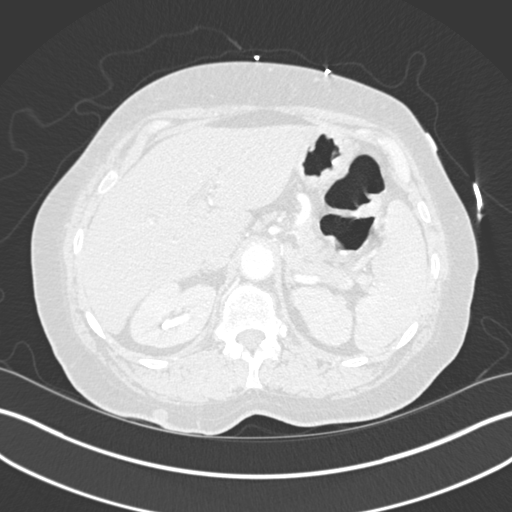
[im 27/269  mediastinal]
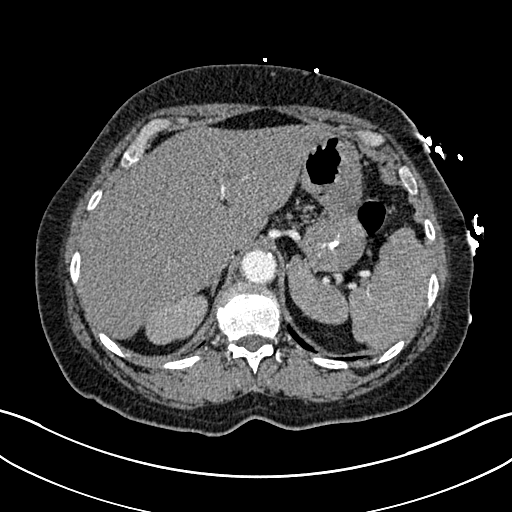
[im 41/269  lung]
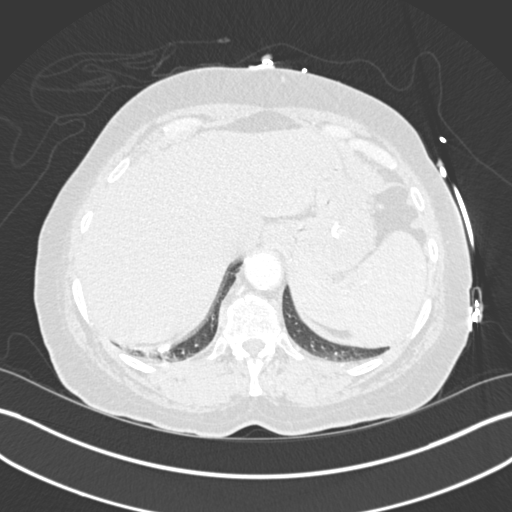
[im 54/269  mediastinal]
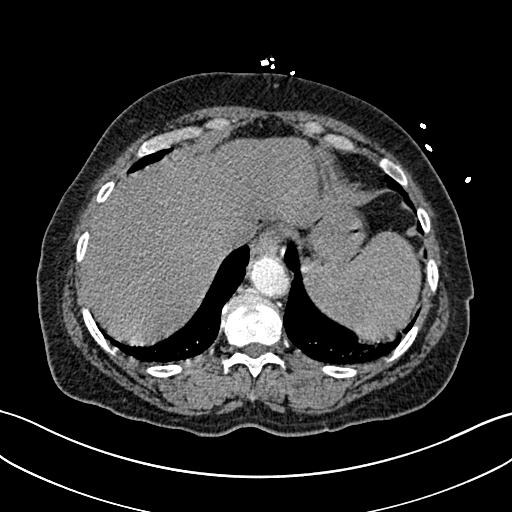
[im 81/269  lung]
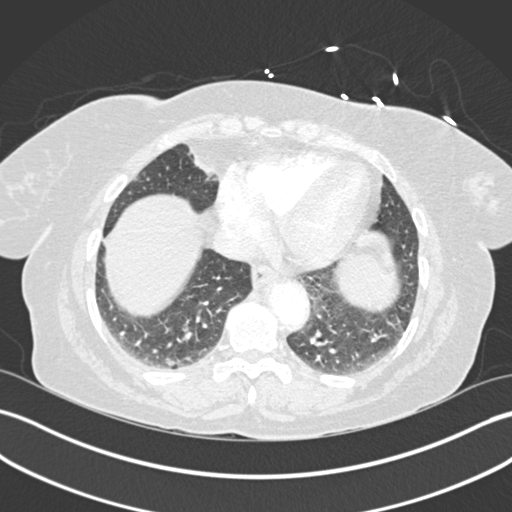
[im 94/269  mediastinal]
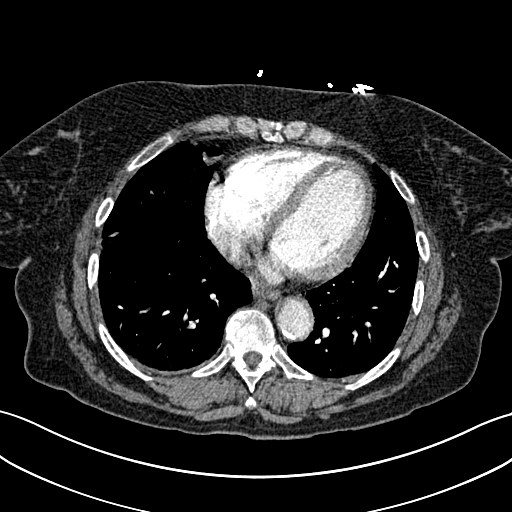
[im 108/269  lung]
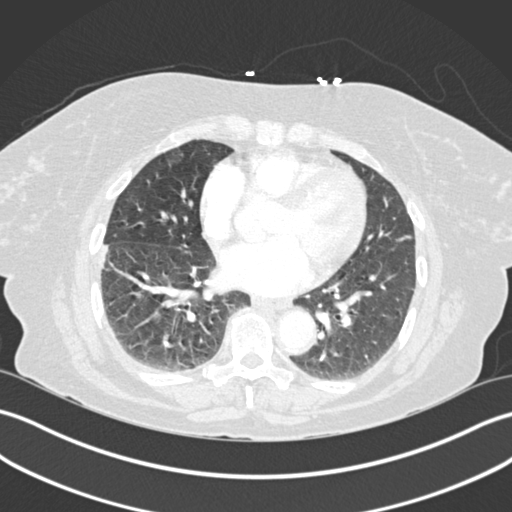
[im 121/269  mediastinal]
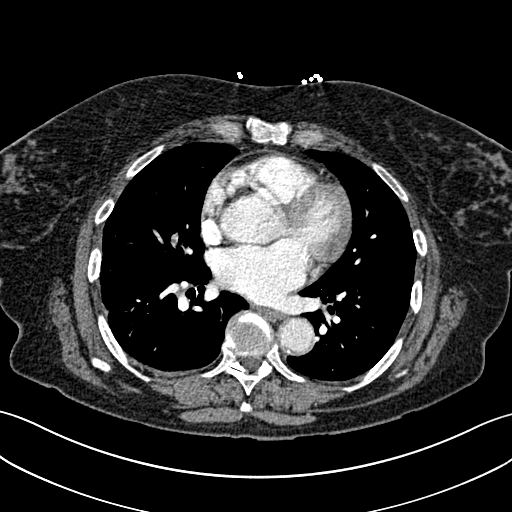
[im 135/269  lung]
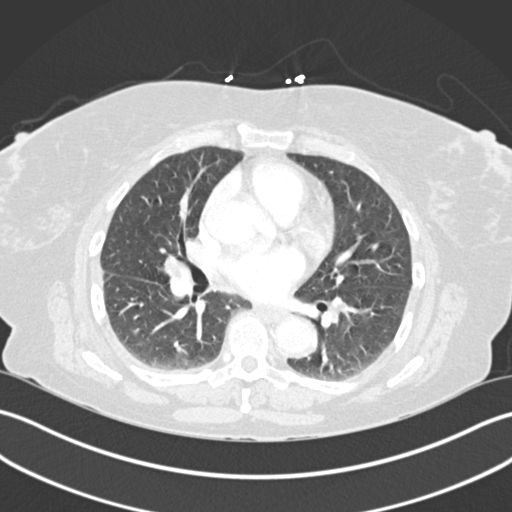
[im 148/269  mediastinal]
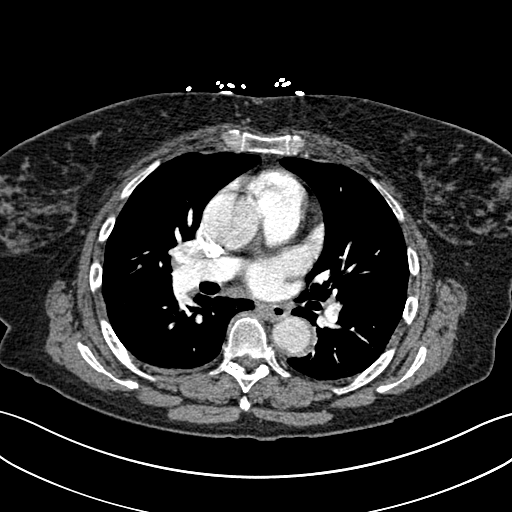
[im 161/269  lung]
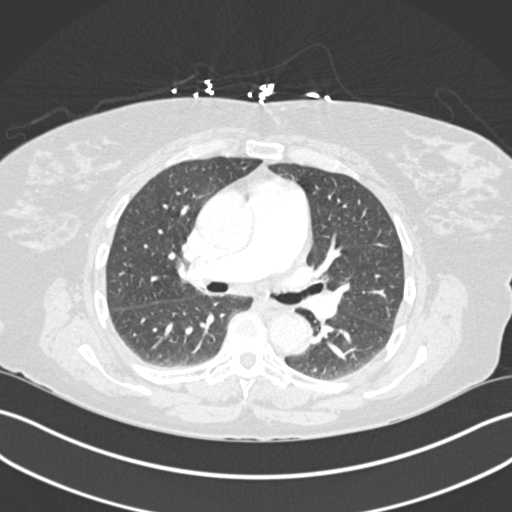
[im 175/269  mediastinal]
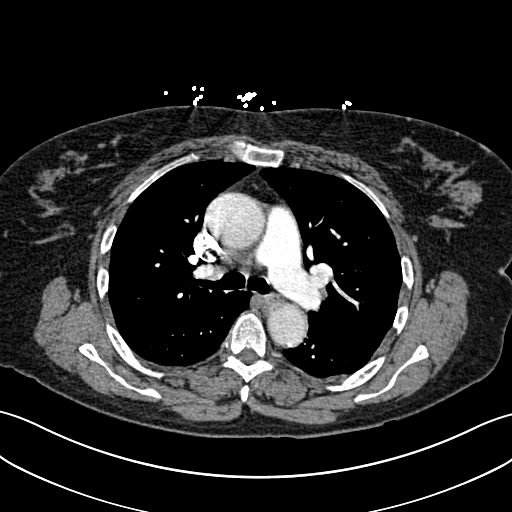
[im 188/269  lung]
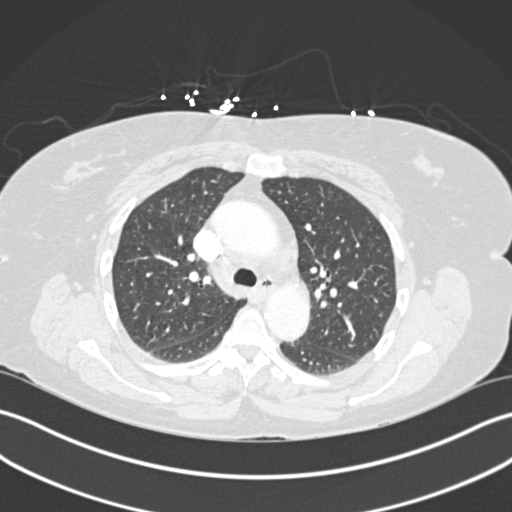
[im 215/269  mediastinal]
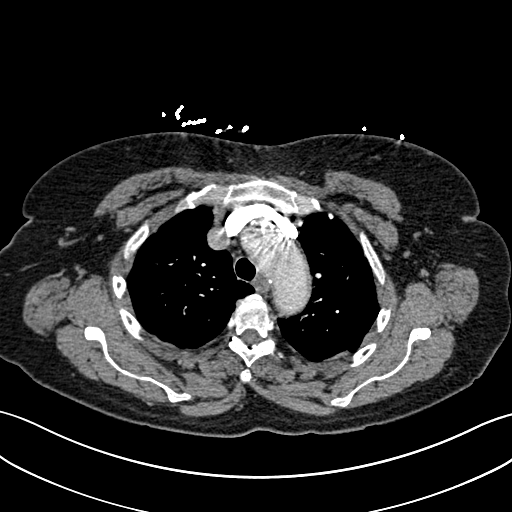
[im 228/269  lung]
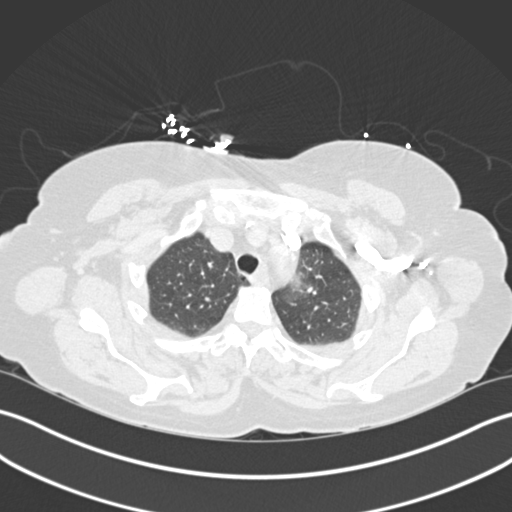
[im 242/269  mediastinal]
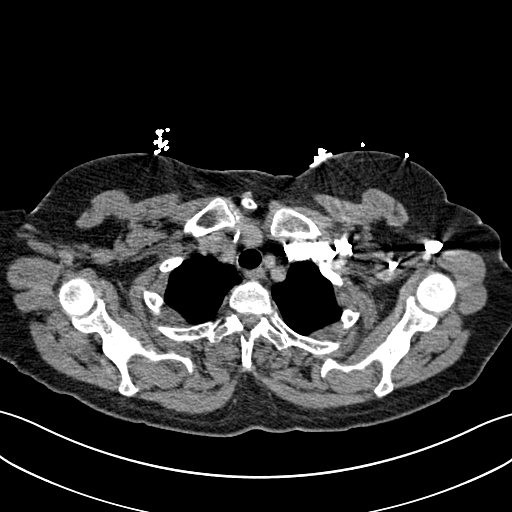
[im 255/269  lung]
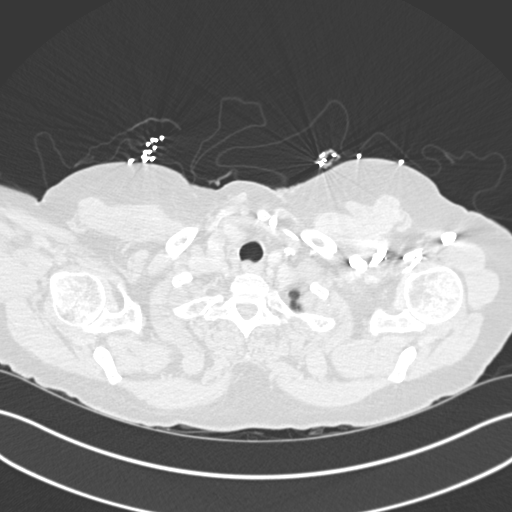

[18 of 36 positions shown; findings below may reference images not displayed]

FINDINGS: There is pulmonary embolus in the segmental pulmonary arteries of
the left lower lobe, right middle lobe, right lower lobe and
subsegmental pulmonary arteries of the right lower lobe. The right
ventricular to left ventricular ratio is 1.05 suggesting right heart
strain. There is no pulmonary infarct. Minimal atelectasis of the
posterior lung bases are identified. There is no pleural effusion.
There is no mediastinal or hilar lymphadenopathy. The heart size is
normal. The visualized upper abdominal structures are normal.
Degenerative joint changes of the spine are identified.

Review of the MIP images confirms the above findings.
IMPRESSION: Acute pulmonary embolus in bilateral lower lobes, right middle lobe.
There is evidence of right heart strain. There is no evidence of
pulmonary infarct.

These results will be called to the ordering clinician or
representative by the Radiologist Assistant, and communication
documented in the PACS or zVision Dashboard.

## 2017-02-25 DIAGNOSIS — N8111 Cystocele, midline: Secondary | ICD-10-CM | POA: Diagnosis not present

## 2017-02-25 DIAGNOSIS — N814 Uterovaginal prolapse, unspecified: Secondary | ICD-10-CM | POA: Diagnosis not present

## 2017-02-28 DIAGNOSIS — N8111 Cystocele, midline: Secondary | ICD-10-CM | POA: Diagnosis not present

## 2017-03-07 ENCOUNTER — Encounter: Payer: Self-pay | Admitting: Internal Medicine

## 2017-03-07 ENCOUNTER — Ambulatory Visit (INDEPENDENT_AMBULATORY_CARE_PROVIDER_SITE_OTHER): Payer: Medicare Other | Admitting: Internal Medicine

## 2017-03-07 VITALS — BP 136/80 | HR 94 | Temp 98.0°F | Resp 16 | Ht 63.0 in | Wt 172.2 lb

## 2017-03-07 DIAGNOSIS — B182 Chronic viral hepatitis C: Secondary | ICD-10-CM

## 2017-03-07 DIAGNOSIS — M4722 Other spondylosis with radiculopathy, cervical region: Secondary | ICD-10-CM | POA: Diagnosis not present

## 2017-03-07 DIAGNOSIS — F418 Other specified anxiety disorders: Secondary | ICD-10-CM | POA: Diagnosis not present

## 2017-03-07 DIAGNOSIS — Z23 Encounter for immunization: Secondary | ICD-10-CM

## 2017-03-07 DIAGNOSIS — L97929 Non-pressure chronic ulcer of unspecified part of left lower leg with unspecified severity: Secondary | ICD-10-CM | POA: Diagnosis not present

## 2017-03-07 DIAGNOSIS — M4682 Other specified inflammatory spondylopathies, cervical region: Secondary | ICD-10-CM

## 2017-03-07 DIAGNOSIS — M4692 Unspecified inflammatory spondylopathy, cervical region: Secondary | ICD-10-CM

## 2017-03-07 DIAGNOSIS — L97922 Non-pressure chronic ulcer of unspecified part of left lower leg with fat layer exposed: Secondary | ICD-10-CM | POA: Diagnosis not present

## 2017-03-07 DIAGNOSIS — I10 Essential (primary) hypertension: Secondary | ICD-10-CM

## 2017-03-07 DIAGNOSIS — M5412 Radiculopathy, cervical region: Secondary | ICD-10-CM

## 2017-03-07 DIAGNOSIS — Z1231 Encounter for screening mammogram for malignant neoplasm of breast: Secondary | ICD-10-CM

## 2017-03-07 MED ORDER — OXYCODONE HCL 15 MG PO TABS: 15.0000 mg | ORAL_TABLET | ORAL | 0 refills | Status: DC | PRN

## 2017-03-07 MED ORDER — ALPRAZOLAM 1 MG PO TABS: 1.0000 mg | ORAL_TABLET | Freq: Two times a day (BID) | ORAL | 3 refills | Status: DC | PRN

## 2017-03-07 NOTE — Patient Instructions (Signed)

## 2017-03-07 NOTE — Progress Notes (Signed)
Subjective:  Patient ID: Brooke Garcia, female    DOB: 30-Sep-1955  Age: 61 y.o. MRN: [id number]  CC: Hypertension   HPI CARSYN BOSTER presents for f/up - She suffers from chronic neck and low back pain and tells me that she recently saw a neurosurgeon and was told that she's not a candidate for surgery because of her coagulopathy. She requests a refill on pain meds to control her pain so that she can get through the day. She also tells me that she recently saw urology and gynecology and said her prolapse cannot be repaired. She describes something like a pessary that she is currently wearing. She tells me her blood pressure has been well controlled and she has had no symptoms related to that.  Outpatient Medications Prior to Visit  Medication Sig Dispense Refill  . arformoterol (BROVANA) 15 MCG/2ML NEBU Take 2 mLs (15 mcg total) by nebulization 2 (two) times daily as needed. 120 mL 11  . chlorthalidone (HYGROTON) 25 MG tablet TAKE 1 TABLET(25 MG) BY MOUTH DAILY 90 tablet 0  . Glycopyrrolate-Formoterol (BEVESPI AEROSPHERE) 9-4.8 MCG/ACT AERO Inhale 2 puffs into the lungs 2 (two) times daily. 10.7 g 11  . irbesartan (AVAPRO) 300 MG tablet TAKE 1 TABLET(300 MG) BY MOUTH DAILY 90 tablet 1  . pantoprazole (PROTONIX) 40 MG tablet TAKE 1 TABLET(40 MG) BY MOUTH DAILY 90 tablet 0  . Rivaroxaban (XARELTO) 15 MG TABS tablet Take 1 tablet (15 mg total) by mouth daily with supper. 90 tablet 1  . ALPRAZolam (XANAX) 1 MG tablet Take 1 tablet (1 mg total) by mouth 2 (two) times daily as needed for anxiety. 60 tablet 3  . oxyCODONE (ROXICODONE) 15 MG immediate release tablet Take 1 tablet (15 mg total) by mouth every 4 (four) hours as needed for pain. 120 tablet 0   No facility-administered medications prior to visit.     ROS Review of Systems  Constitutional: Negative.  Negative for appetite change, diaphoresis, fatigue and unexpected weight change.  HENT: Negative.  Negative for trouble swallowing and  voice change.   Eyes: Negative for pain, redness and visual disturbance.  Respiratory: Negative.  Negative for cough, choking, shortness of breath, wheezing and stridor.   Cardiovascular: Negative for chest pain, palpitations and leg swelling.  Gastrointestinal: Negative for abdominal pain, blood in stool, constipation, diarrhea, nausea and vomiting.  Endocrine: Negative.   Genitourinary: Negative.   Musculoskeletal: Positive for back pain and neck pain. Negative for arthralgias, gait problem, joint swelling, myalgias and neck stiffness.  Skin: Negative.   Allergic/Immunologic: Negative.   Neurological: Negative.  Negative for dizziness, weakness, numbness and headaches.  Hematological: Negative for adenopathy. Does not bruise/bleed easily.  Psychiatric/Behavioral: Negative for behavioral problems, confusion, decreased concentration, dysphoric mood, self-injury, sleep disturbance and suicidal ideas. The patient is nervous/anxious.     Objective:  BP 136/80 (BP Location: Left Arm, Patient Position: Sitting, Cuff Size: Normal)   Pulse 94   Temp 98 F (36.7 C) (Oral)   Resp 16   Ht 5\' 3"  (1.6 m)   Wt 172 lb 4 oz (78.1 kg)   SpO2 98%   BMI 30.51 kg/m   BP Readings from Last 3 Encounters:  03/07/17 136/80  10/25/16 140/80  07/15/16 140/90    Wt Readings from Last 3 Encounters:  03/07/17 172 lb 4 oz (78.1 kg)  10/25/16 181 lb 4 oz (82.2 kg)  07/15/16 187 lb (84.8 kg)    Physical Exam  Constitutional: She is oriented  to person, place, and time. No distress.  HENT:  Mouth/Throat: Oropharynx is clear and moist. No oropharyngeal exudate.  Eyes: Conjunctivae are normal. Right eye exhibits no discharge. Left eye exhibits no discharge. No scleral icterus.  Neck: Normal range of motion. Neck supple. No JVD present. No thyromegaly present.  Cardiovascular: Normal rate, regular rhythm and intact distal pulses.  Exam reveals no gallop and no friction rub.   No murmur  heard. Pulmonary/Chest: Effort normal and breath sounds normal. No respiratory distress. She has no wheezes. She has no rales. She exhibits no tenderness.  Abdominal: Soft. Bowel sounds are normal. She exhibits no distension and no mass. There is no tenderness. There is no rebound and no guarding.  Musculoskeletal: Normal range of motion. She exhibits no edema, tenderness or deformity.  She has a dressing covering the left lower extremity ulcer  Lymphadenopathy:    She has no cervical adenopathy.  Neurological: She is alert and oriented to person, place, and time.  Skin: Skin is warm and dry. No rash noted. She is not diaphoretic. No erythema. No pallor.  Vitals reviewed.   Lab Results  Component Value Date   WBC 8.8 07/15/2016   HGB 13.4 07/15/2016   HCT 39.1 07/15/2016   PLT 253.0 07/15/2016   GLUCOSE 108 (H) 10/25/2016   CHOL 145 11/27/2015   TRIG 134.0 11/27/2015   HDL 40.90 11/27/2015   LDLCALC 77 11/27/2015   ALT 24 07/15/2016   AST 26 07/15/2016   NA 136 10/25/2016   K 4.5 10/25/2016   CL 103 10/25/2016   CREATININE 1.04 10/25/2016   BUN 14 10/25/2016   CO2 27 10/25/2016   TSH 1.40 11/27/2015   INR 1.72 (H) 09/08/2015   HGBA1C 4.4 (L) 07/15/2016    Mr Cervical Spine Wo Contrast  Result Date: 01/14/2017 CLINICAL DATA:  61 year old female with chronic cervical neck pain. Bilateral arm numbness, worse on the right for the past 6 months. Radiculopathy. EXAM: MRI CERVICAL SPINE WITHOUT CONTRAST TECHNIQUE: Multiplanar, multisequence MR imaging of the cervical spine was performed. No intravenous contrast was administered. COMPARISON:  Cervical spine MRI 08/18/2015. Cervical spine radiographs 08/24/2014 FINDINGS: Alignment: Chronic straightening of cervical lordosis. Subtle retrolisthesis of C6 on C7 is stable to mildly increased since 2016. Subtle retrolisthesis of C5 on C6, and anterolisthesis of C4 on C5 appears stable. Vertebrae: Mild degenerative endplate marrow edema at  C6-C7. Other Visualized bone marrow signal is within normal limits. Cord: Spinal cord signal is within normal limits at all visualized levels. Posterior Fossa, vertebral arteries, paraspinal tissues: Cervicomedullary junction is within normal limits. Negative visualized brain parenchyma. Preserved major vascular flow voids in the neck. Negative neck soft tissues. Disc levels: C2-C3: Stable mild right uncovertebral hypertrophy. Stable mild right C3 foraminal stenosis. C3-C4: Chronic moderate to severe left facet hypertrophy. Left greater than right uncovertebral hypertrophy. No spinal stenosis. Stable severe left C4 foraminal stenosis (series 6, image 8). C4-C5: Chronic moderate to severe facet hypertrophy greater on the right. Mild disc bulge and endplate spurring. No spinal stenosis. Moderate left and severe right C5 foraminal stenosis is stable. C5-C6: Mild circumferential disc bulge with broad-based posterior component. Mild to moderate bilateral facet hypertrophy. Spinal stenosis with mild if any ventral spinal cord mass effect. Mild to moderate left and moderate to severe right C6 foraminal stenosis. This level appears stable. C6-C7: Chronic disc space loss appears increased. Circumferential disc bulge with broad-based posterior component. Mild ligament flavum hypertrophy. Spinal stenosis with mild spinal cord mass effect. Foraminal  disc and endplate spurring. Severe right greater than left C7 foraminal stenosis. This level appears stable. C7-T1: Negative disc. Mild to moderate facet hypertrophy. No stenosis. No upper thoracic spinal stenosis. Moderate to severe T1-T2 facet hypertrophy appears stable. IMPRESSION: 1. Questionable increased subtle retrolisthesis of C6 on C7 since the 2016 MRI. There is mild endplate marrow edema at that level which appears degenerative in nature. 2. Stable subtle retrolisthesis of C5 on C6 and anterolisthesis of C4 on C5 since the prior MRI. Chronic upper cervical moderate to  severe facet degeneration, worst on the right at C4-C5. 3. Lower cervical disc degeneration with broad-based disc bulging or protrusions and mild spinal stenosis at C5-C6 and C6-C7 has not significantly changed. Up to mild cord mass effect at both levels with no spinal cord signal abnormality. 4. Multifactorial moderate or severe neural foraminal stenosis has not significantly changed since 2016 at the left C4, right greater than left C5, right C6, and right greater than left C7 nerve levels. Electronically Signed   By: Genevie Ann M.D.   On: 01/14/2017 16:09   Mr Lumbar Spine Wo Contrast  Result Date: 01/14/2017 CLINICAL DATA:  Neurogenic claudication. EXAM: MRI LUMBAR SPINE WITHOUT CONTRAST TECHNIQUE: Multiplanar, multisequence MR imaging of the lumbar spine was performed. No intravenous contrast was administered. COMPARISON:  None. FINDINGS: Segmentation:  Standard. Alignment:  1-2 mm anterolisthesis of L4 on L5. Vertebrae:  No fracture, evidence of discitis, or bone lesion. Conus medullaris: Extends to the T12 level and appears normal. Paraspinal and other soft tissues: No paraspinal abnormality. Infrarenal abdominal aortic ectasia. Disc levels: Disc spaces: Mild disc desiccation at L4-5 with minimal height loss. T12-L1: No significant disc bulge. No evidence of neural foraminal stenosis. No central canal stenosis. L1-L2: No significant disc bulge. No evidence of neural foraminal stenosis. No central canal stenosis. Mild bilateral facet arthropathy. L2-L3: Mild broad-based disc bulge and mild bilateral facet arthropathy. Mild spinal stenosis. No evidence of neural foraminal stenosis. L3-L4: Broad-based disc bulge. Severe bilateral facet arthropathy ligamentum flavum infolding. Severe spinal stenosis. No evidence of neural foraminal stenosis. L4-L5: Broad-based disc bulge. Severe bilateral facet arthropathy. Severe spinal stenosis. No evidence of neural foraminal stenosis. L5-S1: Minimal broad-based disc bulge.  Moderate bilateral facet arthropathy. No evidence of neural foraminal stenosis. No central canal stenosis. IMPRESSION: 1. At L3-4 there is a broad-based disc bulge. Severe bilateral facet arthropathy ligamentum flavum infolding. Severe spinal stenosis. 2. At L4-5 there is a broad-based disc bulge. Severe bilateral facet arthropathy. Severe spinal stenosis. 3. At L5-S1 there is a minimal broad-based disc bulge. Moderate bilateral facet arthropathy. Electronically Signed   By: Kathreen Devoid   On: 01/14/2017 16:34    Assessment & Plan:   Aimy was seen today for hypertension.  Diagnoses and all orders for this visit:  Chronic hepatitis C without hepatic coma ([place])- she wants to consider treatment options for hep C infection -     AMB referral to hepatitis C clinic  Cervical spondylitis with radiculitis ([place]) -     Discontinue: oxyCODONE (ROXICODONE) 15 MG immediate release tablet; Take 1 tablet (15 mg total) by mouth every 4 (four) hours as needed for pain. -     Discontinue: oxyCODONE (ROXICODONE) 15 MG immediate release tablet; Take 1 tablet (15 mg total) by mouth every 4 (four) hours as needed for pain. -     oxyCODONE (ROXICODONE) 15 MG immediate release tablet; Take 1 tablet (15 mg total) by mouth every 4 (four) hours as needed for pain.  Leg ulcer, left, with unspecified severity ([place]) -     Discontinue: oxyCODONE (ROXICODONE) 15 MG immediate release tablet; Take 1 tablet (15 mg total) by mouth every 4 (four) hours as needed for pain. -     Discontinue: oxyCODONE (ROXICODONE) 15 MG immediate release tablet; Take 1 tablet (15 mg total) by mouth every 4 (four) hours as needed for pain. -     oxyCODONE (ROXICODONE) 15 MG immediate release tablet; Take 1 tablet (15 mg total) by mouth every 4 (four) hours as needed for pain.  Leg ulcer, left, with fat layer exposed ([place]) -     Discontinue: oxyCODONE (ROXICODONE) 15 MG immediate release tablet; Take 1 tablet (15 mg total) by mouth every 4 (four)  hours as needed for pain. -     Discontinue: oxyCODONE (ROXICODONE) 15 MG immediate release tablet; Take 1 tablet (15 mg total) by mouth every 4 (four) hours as needed for pain. -     oxyCODONE (ROXICODONE) 15 MG immediate release tablet; Take 1 tablet (15 mg total) by mouth every 4 (four) hours as needed for pain.  Depression with anxiety -     ALPRAZolam (XANAX) 1 MG tablet; Take 1 tablet (1 mg total) by mouth 2 (two) times daily as needed for anxiety.  Visit for screening mammogram -     MM DIGITAL SCREENING BILATERAL; Future  Need for immunization against viral hepatitis  HYPERTENSION, BENIGN ESSENTIAL- her blood pressure is well-controlled, recent electrolytes and renal function were normal.   I am having Ms. Pfost maintain her Glycopyrrolate-Formoterol, arformoterol, Rivaroxaban, irbesartan, pantoprazole, chlorthalidone, ALPRAZolam, and oxyCODONE.  Meds ordered this encounter  Medications  . DISCONTD: oxyCODONE (ROXICODONE) 15 MG immediate release tablet    Sig: Take 1 tablet (15 mg total) by mouth every 4 (four) hours as needed for pain.    Dispense:  120 tablet    Refill:  0    Fill on or after 03/14/17  . ALPRAZolam (XANAX) 1 MG tablet    Sig: Take 1 tablet (1 mg total) by mouth 2 (two) times daily as needed for anxiety.    Dispense:  60 tablet    Refill:  3  . DISCONTD: oxyCODONE (ROXICODONE) 15 MG immediate release tablet    Sig: Take 1 tablet (15 mg total) by mouth every 4 (four) hours as needed for pain.    Dispense:  120 tablet    Refill:  0    Fill on or after 04/14/17  . oxyCODONE (ROXICODONE) 15 MG immediate release tablet    Sig: Take 1 tablet (15 mg total) by mouth every 4 (four) hours as needed for pain.    Dispense:  120 tablet    Refill:  0    Fill on or after 05/15/17     Follow-up: Return in about 3 months (around 06/07/2017).  Scarlette Calico, MD

## 2017-03-28 ENCOUNTER — Ambulatory Visit: Payer: Medicare Other

## 2017-03-28 DIAGNOSIS — N8111 Cystocele, midline: Secondary | ICD-10-CM | POA: Diagnosis not present

## 2017-03-28 DIAGNOSIS — N814 Uterovaginal prolapse, unspecified: Secondary | ICD-10-CM | POA: Diagnosis not present

## 2017-03-31 ENCOUNTER — Ambulatory Visit: Payer: Medicare Other

## 2017-04-02 ENCOUNTER — Other Ambulatory Visit: Payer: Self-pay | Admitting: Internal Medicine

## 2017-04-02 DIAGNOSIS — I10 Essential (primary) hypertension: Secondary | ICD-10-CM

## 2017-04-02 DIAGNOSIS — K219 Gastro-esophageal reflux disease without esophagitis: Secondary | ICD-10-CM

## 2017-04-08 ENCOUNTER — Ambulatory Visit: Payer: Medicare Other

## 2017-04-19 ENCOUNTER — Ambulatory Visit
Admission: RE | Admit: 2017-04-19 | Discharge: 2017-04-19 | Disposition: A | Discharge status: Home / Self Care | Payer: Medicare Other | Source: Ambulatory Visit | Attending: Internal Medicine | Admitting: Internal Medicine

## 2017-04-19 DIAGNOSIS — N8111 Cystocele, midline: Secondary | ICD-10-CM | POA: Diagnosis not present

## 2017-04-19 DIAGNOSIS — Z1231 Encounter for screening mammogram for malignant neoplasm of breast: Secondary | ICD-10-CM | POA: Diagnosis not present

## 2017-04-19 DIAGNOSIS — N814 Uterovaginal prolapse, unspecified: Secondary | ICD-10-CM | POA: Diagnosis not present

## 2017-04-20 LAB — HM MAMMOGRAPHY

## 2017-05-14 ENCOUNTER — Other Ambulatory Visit: Payer: Self-pay | Admitting: Internal Medicine

## 2017-05-14 DIAGNOSIS — I2699 Other pulmonary embolism without acute cor pulmonale: Secondary | ICD-10-CM

## 2017-05-14 DIAGNOSIS — D6859 Other primary thrombophilia: Secondary | ICD-10-CM

## 2017-05-14 DIAGNOSIS — I825Y3 Chronic embolism and thrombosis of unspecified deep veins of proximal lower extremity, bilateral: Secondary | ICD-10-CM

## 2017-06-09 ENCOUNTER — Encounter: Payer: Self-pay | Admitting: Internal Medicine

## 2017-06-09 ENCOUNTER — Ambulatory Visit (INDEPENDENT_AMBULATORY_CARE_PROVIDER_SITE_OTHER): Payer: Medicare Other | Admitting: Internal Medicine

## 2017-06-09 ENCOUNTER — Other Ambulatory Visit (INDEPENDENT_AMBULATORY_CARE_PROVIDER_SITE_OTHER): Payer: Medicare Other

## 2017-06-09 VITALS — BP 138/88 | HR 90 | Temp 98.4°F | Resp 16 | Ht 63.0 in | Wt 196.0 lb

## 2017-06-09 DIAGNOSIS — L97929 Non-pressure chronic ulcer of unspecified part of left lower leg with unspecified severity: Secondary | ICD-10-CM | POA: Diagnosis not present

## 2017-06-09 DIAGNOSIS — I10 Essential (primary) hypertension: Secondary | ICD-10-CM

## 2017-06-09 DIAGNOSIS — L97922 Non-pressure chronic ulcer of unspecified part of left lower leg with fat layer exposed: Secondary | ICD-10-CM | POA: Diagnosis not present

## 2017-06-09 DIAGNOSIS — E785 Hyperlipidemia, unspecified: Secondary | ICD-10-CM | POA: Diagnosis not present

## 2017-06-09 DIAGNOSIS — I2699 Other pulmonary embolism without acute cor pulmonale: Secondary | ICD-10-CM | POA: Diagnosis not present

## 2017-06-09 DIAGNOSIS — J410 Simple chronic bronchitis: Secondary | ICD-10-CM | POA: Diagnosis not present

## 2017-06-09 DIAGNOSIS — I82403 Acute embolism and thrombosis of unspecified deep veins of lower extremity, bilateral: Secondary | ICD-10-CM

## 2017-06-09 DIAGNOSIS — M4722 Other spondylosis with radiculopathy, cervical region: Secondary | ICD-10-CM | POA: Diagnosis not present

## 2017-06-09 DIAGNOSIS — M5412 Radiculopathy, cervical region: Secondary | ICD-10-CM

## 2017-06-09 DIAGNOSIS — F418 Other specified anxiety disorders: Secondary | ICD-10-CM

## 2017-06-09 DIAGNOSIS — M4692 Unspecified inflammatory spondylopathy, cervical region: Secondary | ICD-10-CM

## 2017-06-09 DIAGNOSIS — K219 Gastro-esophageal reflux disease without esophagitis: Secondary | ICD-10-CM | POA: Diagnosis not present

## 2017-06-09 DIAGNOSIS — I825Y3 Chronic embolism and thrombosis of unspecified deep veins of proximal lower extremity, bilateral: Secondary | ICD-10-CM

## 2017-06-09 DIAGNOSIS — Z23 Encounter for immunization: Secondary | ICD-10-CM | POA: Diagnosis not present

## 2017-06-09 DIAGNOSIS — R3129 Other microscopic hematuria: Secondary | ICD-10-CM

## 2017-06-09 DIAGNOSIS — D6859 Other primary thrombophilia: Secondary | ICD-10-CM

## 2017-06-09 DIAGNOSIS — Z79891 Long term (current) use of opiate analgesic: Secondary | ICD-10-CM | POA: Insufficient documentation

## 2017-06-09 DIAGNOSIS — M4682 Other specified inflammatory spondylopathies, cervical region: Secondary | ICD-10-CM

## 2017-06-09 LAB — URINALYSIS, ROUTINE W REFLEX MICROSCOPIC
BILIRUBIN URINE: NEGATIVE
KETONES UR: NEGATIVE
LEUKOCYTES UA: NEGATIVE
NITRITE: NEGATIVE
PH: 6 (ref 5.0–8.0)
Specific Gravity, Urine: 1.025 (ref 1.000–1.030)
TOTAL PROTEIN, URINE-UPE24: NEGATIVE
Urine Glucose: NEGATIVE
Urobilinogen, UA: 0.2 (ref 0.0–1.0)
WBC UA: NONE SEEN (ref 0–?)

## 2017-06-09 LAB — LIPID PANEL
CHOLESTEROL: 143 mg/dL (ref 0–200)
HDL: 61 mg/dL (ref >39.00)
LDL Cholesterol: 63 mg/dL (ref 0–99)
NonHDL: 82.05
TRIGLYCERIDES: 96 mg/dL (ref 0.0–149.0)
Total CHOL/HDL Ratio: 2
VLDL: 19.2 mg/dL (ref 0.0–40.0)

## 2017-06-09 LAB — CBC WITH DIFFERENTIAL/PLATELET
BASOS ABS: 0.1 10*3/uL (ref 0.0–0.1)
Basophils Relative: 1.3 % (ref 0.0–3.0)
EOS ABS: 0.3 10*3/uL (ref 0.0–0.7)
Eosinophils Relative: 2.8 % (ref 0.0–5.0)
HEMATOCRIT: 34.2 % — AB (ref 36.0–46.0)
HEMOGLOBIN: 11.7 g/dL — AB (ref 12.0–15.0)
LYMPHS PCT: 22.5 % (ref 12.0–46.0)
Lymphs Abs: 2.1 10*3/uL (ref 0.7–4.0)
MCHC: 34.2 g/dL (ref 30.0–36.0)
MCV: 96.9 fl (ref 78.0–100.0)
MONOS PCT: 6.3 % (ref 3.0–12.0)
Monocytes Absolute: 0.6 10*3/uL (ref 0.1–1.0)
Neutro Abs: 6.4 10*3/uL (ref 1.4–7.7)
Neutrophils Relative %: 67.1 % (ref 43.0–77.0)
PLATELETS: 277 10*3/uL (ref 150.0–400.0)
RBC: 3.53 Mil/uL — AB (ref 3.87–5.11)
RDW: 12.8 % (ref 11.5–15.5)
WBC: 9.5 10*3/uL (ref 4.0–10.5)

## 2017-06-09 LAB — COMPREHENSIVE METABOLIC PANEL
ALBUMIN: 4.2 g/dL (ref 3.5–5.2)
ALT: 33 U/L (ref 0–35)
AST: 38 U/L — AB (ref 0–37)
Alkaline Phosphatase: 104 U/L (ref 39–117)
BILIRUBIN TOTAL: 0.7 mg/dL (ref 0.2–1.2)
BUN: 18 mg/dL (ref 6–23)
CALCIUM: 9.5 mg/dL (ref 8.4–10.5)
CO2: 26 mEq/L (ref 19–32)
CREATININE: 1.48 mg/dL — AB (ref 0.40–1.20)
Chloride: 93 mEq/L — ABNORMAL LOW (ref 96–112)
GFR: 38.13 mL/min — AB (ref >60.00)
Glucose, Bld: 120 mg/dL — ABNORMAL HIGH (ref 70–99)
Potassium: 4.8 mEq/L (ref 3.5–5.1)
Sodium: 126 mEq/L — ABNORMAL LOW (ref 135–145)
Total Protein: 7.7 g/dL (ref 6.0–8.3)

## 2017-06-09 MED ORDER — IRBESARTAN 300 MG PO TABS: ORAL_TABLET | ORAL | 1 refills | Status: DC

## 2017-06-09 MED ORDER — GLYCOPYRROLATE-FORMOTEROL 9-4.8 MCG/ACT IN AERO: 2.0000 | INHALATION_SPRAY | Freq: Two times a day (BID) | RESPIRATORY_TRACT | 11 refills | Status: DC

## 2017-06-09 MED ORDER — OXYCODONE HCL 15 MG PO TABS: 15.0000 mg | ORAL_TABLET | ORAL | 0 refills | Status: DC | PRN

## 2017-06-09 MED ORDER — ALPRAZOLAM 1 MG PO TABS: 1.0000 mg | ORAL_TABLET | Freq: Two times a day (BID) | ORAL | 3 refills | Status: AC | PRN

## 2017-06-09 MED ORDER — ARFORMOTEROL TARTRATE 15 MCG/2ML IN NEBU: 15.0000 ug | INHALATION_SOLUTION | Freq: Two times a day (BID) | RESPIRATORY_TRACT | 11 refills | Status: DC | PRN

## 2017-06-09 MED ORDER — PANTOPRAZOLE SODIUM 40 MG PO TBEC: DELAYED_RELEASE_TABLET | ORAL | 1 refills | Status: AC

## 2017-06-09 MED ORDER — RIVAROXABAN 15 MG PO TABS: ORAL_TABLET | ORAL | 1 refills | Status: AC

## 2017-06-09 NOTE — Progress Notes (Signed)
Subjective:  Patient ID: Brooke Garcia, female    DOB: May 07, 1956  Age: 61 y.o. MRN: [id number]  CC: Hyperlipidemia; Hypertension; and COPD   HPI Brooke Garcia presents for f/up - She complains of persistent pain over her left lower extremity related to a chronic ulcer. She tells me that oxycodone controls the pain. Her breathing is at her baseline with rare episodes of shortness of breath and nonproductive cough. Overall, she tells me she feels better than usual today.  Outpatient Medications Prior to Visit  Medication Sig Dispense Refill  . chlorthalidone (HYGROTON) 25 MG tablet TAKE 1 TABLET(25 MG) BY MOUTH DAILY 90 tablet 0  . ALPRAZolam (XANAX) 1 MG tablet Take 1 tablet (1 mg total) by mouth 2 (two) times daily as needed for anxiety. 60 tablet 3  . arformoterol (BROVANA) 15 MCG/2ML NEBU Take 2 mLs (15 mcg total) by nebulization 2 (two) times daily as needed. 120 mL 11  . Glycopyrrolate-Formoterol (BEVESPI AEROSPHERE) 9-4.8 MCG/ACT AERO Inhale 2 puffs into the lungs 2 (two) times daily. 10.7 g 11  . irbesartan (AVAPRO) 300 MG tablet TAKE 1 TABLET(300 MG) BY MOUTH DAILY 90 tablet 1  . oxyCODONE (ROXICODONE) 15 MG immediate release tablet Take 1 tablet (15 mg total) by mouth every 4 (four) hours as needed for pain. 120 tablet 0  . pantoprazole (PROTONIX) 40 MG tablet TAKE 1 TABLET(40 MG) BY MOUTH DAILY 90 tablet 0  . XARELTO 15 MG TABS tablet TAKE 1 TABLET(15 MG) BY MOUTH DAILY WITH SUPPER 90 tablet 0  . pantoprazole (PROTONIX) 40 MG tablet TAKE 1 TABLET(40 MG) BY MOUTH DAILY 90 tablet 0   No facility-administered medications prior to visit.     ROS Review of Systems  Constitutional: Negative for appetite change, chills, diaphoresis, fatigue and unexpected weight change.  HENT: Negative.  Negative for sinus pressure and trouble swallowing.   Eyes: Negative.   Respiratory: Negative.  Negative for cough, chest tightness, shortness of breath and wheezing.   Cardiovascular: Negative  for chest pain, palpitations and leg swelling.  Gastrointestinal: Negative for abdominal pain, blood in stool, constipation, diarrhea, nausea and vomiting.  Endocrine: Negative.   Genitourinary: Negative.  Negative for decreased urine volume, difficulty urinating, dysuria, flank pain, frequency, hematuria and urgency.  Musculoskeletal: Positive for arthralgias and neck pain. Negative for myalgias.  Skin: Negative.  Negative for color change and rash.  Allergic/Immunologic: Negative.   Neurological: Negative.  Negative for dizziness, weakness, light-headedness and numbness.  Hematological: Negative for adenopathy. Does not bruise/bleed easily.  Psychiatric/Behavioral: Negative for behavioral problems, decreased concentration, dysphoric mood, self-injury, sleep disturbance and suicidal ideas. The patient is nervous/anxious.     Objective:  BP 138/88 (BP Location: Left Arm, Patient Position: Sitting, Cuff Size: Normal)   Pulse 90   Temp 98.4 F (36.9 C) (Oral)   Resp 16   Ht 5\' 3"  (1.6 m)   Wt 196 lb (88.9 kg)   SpO2 96%   BMI 34.72 kg/m   BP Readings from Last 3 Encounters:  06/09/17 138/88  03/07/17 136/80  10/25/16 140/80    Wt Readings from Last 3 Encounters:  06/09/17 196 lb (88.9 kg)  03/07/17 172 lb 4 oz (78.1 kg)  10/25/16 181 lb 4 oz (82.2 kg)    Physical Exam  Constitutional: She is oriented to person, place, and time. No distress.  HENT:  Mouth/Throat: Oropharynx is clear and moist. No oropharyngeal exudate.  Eyes: Conjunctivae are normal. Right eye exhibits no discharge. Left eye  exhibits no discharge. No scleral icterus.  Neck: Normal range of motion. Neck supple. No JVD present. No thyromegaly present.  Cardiovascular: Normal rate, regular rhythm and intact distal pulses.  Exam reveals no gallop and no friction rub.   No murmur heard. Pulmonary/Chest: Effort normal and breath sounds normal. No respiratory distress. She has no wheezes. She has no rales. She  exhibits no tenderness.  Abdominal: Soft. Bowel sounds are normal. She exhibits no distension and no mass. There is no tenderness. There is no rebound and no guarding.  Musculoskeletal: Normal range of motion. She exhibits deformity. She exhibits no edema or tenderness.  She has a protective device covering her left lower extremity  Lymphadenopathy:    She has no cervical adenopathy.  Neurological: She is alert and oriented to person, place, and time.  Skin: Skin is warm and dry. No rash noted. She is not diaphoretic. No erythema. No pallor.  Psychiatric: She has a normal mood and affect. Her behavior is normal. Judgment and thought content normal.  Vitals reviewed.   Lab Results  Component Value Date   WBC 9.5 06/09/2017   HGB 11.7 (L) 06/09/2017   HCT 34.2 (L) 06/09/2017   PLT 277.0 06/09/2017   GLUCOSE 120 (H) 06/09/2017   CHOL 143 06/09/2017   TRIG 96.0 06/09/2017   HDL 61.00 06/09/2017   LDLCALC 63 06/09/2017   ALT 33 06/09/2017   AST 38 (H) 06/09/2017   NA 126 (L) 06/09/2017   K 4.8 06/09/2017   CL 93 (L) 06/09/2017   CREATININE 1.48 (H) 06/09/2017   BUN 18 06/09/2017   CO2 26 06/09/2017   TSH 1.40 11/27/2015   INR 1.72 (H) 09/08/2015   HGBA1C 4.4 (L) 07/15/2016    Mm Screening Breast Tomo Bilateral  Result Date: 04/20/2017 CLINICAL DATA:  Screening. EXAM: 2D DIGITAL SCREENING BILATERAL MAMMOGRAM WITH CAD AND ADJUNCT TOMO COMPARISON:  Previous exam(s). ACR Breast Density Category c: The breast tissue is heterogeneously dense, which may obscure small masses. FINDINGS: There are no findings suspicious for malignancy. Images were processed with CAD. IMPRESSION: No mammographic evidence of malignancy. A result letter of this screening mammogram will be mailed directly to the patient. RECOMMENDATION: Screening mammogram in one year. (Code:SM-B-01Y) BI-RADS CATEGORY  1: Negative. Electronically Signed   By: Lovey Newcomer M.D.   On: 04/20/2017 08:19    Assessment & Plan:    Brooke Garcia was seen today for hyperlipidemia, hypertension and copd.  Diagnoses and all orders for this visit:  Need for influenza vaccination -     Flu Vaccine QUAD 36+ mos IM  Encounter for long-term opiate analgesic use- will screen for compliance and substance abuse. -     Pain Mgmt, Profile 8 w/Conf, U; Future  HYPERTENSION, BENIGN ESSENTIAL- her blood pressure is well-controlled. There has been a slight decline in her renal function with hematuria. The rest of her urine sediment is unremarkable. She will avoid nephrotoxic agents. -     Comprehensive metabolic panel; Future -     CBC with Differential/Platelet; Future -     Urinalysis, Routine w reflex microscopic; Future -     irbesartan (AVAPRO) 300 MG tablet; TAKE 1 TABLET(300 MG) BY MOUTH DAILY  Hyperlipidemia with target LDL less than 130- her ASCVD risk score is not elevated so I do not recommend that she take a statin for CV risk reduction. -     Lipid panel; Future -     Thyroid Panel With TSH; Future  Cervical spondylitis  with radiculitis ([place]) -     Discontinue: oxyCODONE (ROXICODONE) 15 MG immediate release tablet; Take 1 tablet (15 mg total) by mouth every 4 (four) hours as needed for pain. -     Discontinue: oxyCODONE (ROXICODONE) 15 MG immediate release tablet; Take 1 tablet (15 mg total) by mouth every 4 (four) hours as needed for pain. -     oxyCODONE (ROXICODONE) 15 MG immediate release tablet; Take 1 tablet (15 mg total) by mouth every 4 (four) hours as needed for pain.  Simple chronic bronchitis ([place]) -     Glycopyrrolate-Formoterol (BEVESPI AEROSPHERE) 9-4.8 MCG/ACT AERO; Inhale 2 puffs into the lungs 2 (two) times daily. -     arformoterol (BROVANA) 15 MCG/2ML NEBU; Take 2 mLs (15 mcg total) by nebulization 2 (two) times daily as needed.  Leg ulcer, left, with unspecified severity ([place]) -     Discontinue: oxyCODONE (ROXICODONE) 15 MG immediate release tablet; Take 1 tablet (15 mg total) by mouth every 4 (four)  hours as needed for pain. -     Discontinue: oxyCODONE (ROXICODONE) 15 MG immediate release tablet; Take 1 tablet (15 mg total) by mouth every 4 (four) hours as needed for pain. -     oxyCODONE (ROXICODONE) 15 MG immediate release tablet; Take 1 tablet (15 mg total) by mouth every 4 (four) hours as needed for pain.  Leg ulcer, left, with fat layer exposed ([place]) -     Discontinue: oxyCODONE (ROXICODONE) 15 MG immediate release tablet; Take 1 tablet (15 mg total) by mouth every 4 (four) hours as needed for pain. -     Discontinue: oxyCODONE (ROXICODONE) 15 MG immediate release tablet; Take 1 tablet (15 mg total) by mouth every 4 (four) hours as needed for pain. -     oxyCODONE (ROXICODONE) 15 MG immediate release tablet; Take 1 tablet (15 mg total) by mouth every 4 (four) hours as needed for pain.  Depression with anxiety -     ALPRAZolam (XANAX) 1 MG tablet; Take 1 tablet (1 mg total) by mouth 2 (two) times daily as needed for anxiety.  Deep vein thrombosis (DVT) of both lower extremities, unspecified chronicity, unspecified vein (HCC)  Gastroesophageal reflux disease without esophagitis -     pantoprazole (PROTONIX) 40 MG tablet; TAKE 1 TABLET(40 MG) BY MOUTH DAILY  Chronic deep vein thrombosis (DVT) of proximal vein of both lower extremities (HCC) -     Rivaroxaban (XARELTO) 15 MG TABS tablet; TAKE 1 TABLET(15 MG) BY MOUTH DAILY WITH SUPPER  Other acute pulmonary embolism without acute cor pulmonale (HCC) -     Rivaroxaban (XARELTO) 15 MG TABS tablet; TAKE 1 TABLET(15 MG) BY MOUTH DAILY WITH SUPPER  HYPERCOAGULABLE STATE, PRIMARY -     Rivaroxaban (XARELTO) 15 MG TABS tablet; TAKE 1 TABLET(15 MG) BY MOUTH DAILY WITH SUPPER  Other microscopic hematuria- she has new onset hematuria and anemia. She has a history of tobacco abuse so is high risk for renal cell carcinoma and bladder cancer. Will substernally get a CT without contrast of the kidneys and bladder and will consider referring for  cystoscopy. -     CT RENAL ABD W/WO; Future   I have changed Brooke Garcia's XARELTO to Rivaroxaban. I am also having her maintain her chlorthalidone, Glycopyrrolate-Formoterol, ALPRAZolam, oxyCODONE, arformoterol, irbesartan, and pantoprazole.  Meds ordered this encounter  Medications  . Glycopyrrolate-Formoterol (BEVESPI AEROSPHERE) 9-4.8 MCG/ACT AERO    Sig: Inhale 2 puffs into the lungs 2 (two) times daily.    Dispense:  10.7 g    Refill:  11  . DISCONTD: oxyCODONE (ROXICODONE) 15 MG immediate release tablet    Sig: Take 1 tablet (15 mg total) by mouth every 4 (four) hours as needed for pain.    Dispense:  120 tablet    Refill:  0    Fill on or after 06/14/17  . ALPRAZolam (XANAX) 1 MG tablet    Sig: Take 1 tablet (1 mg total) by mouth 2 (two) times daily as needed for anxiety.    Dispense:  60 tablet    Refill:  3  . DISCONTD: oxyCODONE (ROXICODONE) 15 MG immediate release tablet    Sig: Take 1 tablet (15 mg total) by mouth every 4 (four) hours as needed for pain.    Dispense:  120 tablet    Refill:  0    Fill on or after 07/15/17  . oxyCODONE (ROXICODONE) 15 MG immediate release tablet    Sig: Take 1 tablet (15 mg total) by mouth every 4 (four) hours as needed for pain.    Dispense:  120 tablet    Refill:  0    Fill on or after 08/14/17  . arformoterol (BROVANA) 15 MCG/2ML NEBU    Sig: Take 2 mLs (15 mcg total) by nebulization 2 (two) times daily as needed.    Dispense:  120 mL    Refill:  11  . irbesartan (AVAPRO) 300 MG tablet    Sig: TAKE 1 TABLET(300 MG) BY MOUTH DAILY    Dispense:  90 tablet    Refill:  1  . pantoprazole (PROTONIX) 40 MG tablet    Sig: TAKE 1 TABLET(40 MG) BY MOUTH DAILY    Dispense:  90 tablet    Refill:  1  . Rivaroxaban (XARELTO) 15 MG TABS tablet    Sig: TAKE 1 TABLET(15 MG) BY MOUTH DAILY WITH SUPPER    Dispense:  90 tablet    Refill:  1     Follow-up: Return in about 3 months (around 09/08/2017).  Scarlette Calico, MD

## 2017-06-09 NOTE — Patient Instructions (Signed)

## 2017-06-10 ENCOUNTER — Encounter: Payer: Self-pay | Admitting: Internal Medicine

## 2017-06-10 DIAGNOSIS — R3129 Other microscopic hematuria: Secondary | ICD-10-CM | POA: Insufficient documentation

## 2017-06-14 NOTE — Telephone Encounter (Signed)
Pt has an appointment with her urologist on 10/16 and would like to wait till after that to get her CT. She doesn't feel it is kidney stones because she has had them in the past and she said she would know if it was that. Her bladder descended earlier in the year and thinks it may be what's caused this.  She would like to discuss with urologist and wants to know if its ok to wait Please advise

## 2017-06-15 NOTE — Telephone Encounter (Signed)
Pt is going out of town in the morning and won't be back until 10/15. She is seeing her urologist on 10/16. Went ahead and scheduled her for 10/17 for CT scan just in case urologist still wants her to have it.

## 2017-06-17 LAB — PAIN MGMT, PROFILE 8 W/CONF, U
6 ACETYLMORPHINE: 25 ng/mL — AB (ref <10)
6 ACETYLMORPHINE: POSITIVE ng/mL — AB (ref <10)
ALCOHOL METABOLITES: NEGATIVE ng/mL (ref <500)
ALPHAHYDROXYTRIAZOLAM: NEGATIVE ng/mL (ref <50)
Alphahydroxyalprazolam: NEGATIVE ng/mL (ref <25)
Alphahydroxymidazolam: NEGATIVE ng/mL (ref <50)
Aminoclonazepam: NEGATIVE ng/mL (ref <25)
Amphetamines: NEGATIVE ng/mL (ref <500)
Benzodiazepines: NEGATIVE ng/mL (ref <100)
Buprenorphine, Urine: NEGATIVE ng/mL (ref <5)
CODEINE: NEGATIVE ng/mL (ref <50)
Cocaine Metabolite: NEGATIVE ng/mL (ref <150)
Creatinine: 201.4 mg/dL
HYDROCODONE: NEGATIVE ng/mL (ref <50)
HYDROXYETHYLFLURAZEPAM: NEGATIVE ng/mL (ref <50)
Hydromorphone: NEGATIVE ng/mL (ref <50)
Lorazepam: NEGATIVE ng/mL (ref <50)
MARIJUANA METABOLITE: NEGATIVE ng/mL (ref <20)
MDMA: NEGATIVE ng/mL (ref <500)
Morphine: NEGATIVE ng/mL (ref <50)
NORDIAZEPAM: NEGATIVE ng/mL (ref <50)
NORHYDROCODONE: NEGATIVE ng/mL (ref <50)
NOROXYCODONE: NEGATIVE ng/mL (ref <50)
OPIATES: NEGATIVE ng/mL (ref <100)
OXAZEPAM: NEGATIVE ng/mL (ref <50)
OXYCODONE: POSITIVE ng/mL — AB (ref <100)
OXYMORPHONE: 122 ng/mL — AB (ref <50)
Oxidant: NEGATIVE ug/mL (ref <200)
Temazepam: NEGATIVE ng/mL (ref <50)
pH: 6.22 (ref 4.5–9.0)

## 2017-06-17 LAB — THYROID PANEL WITH TSH
FREE THYROXINE INDEX: 3.1 (ref 1.4–3.8)
T3 Uptake: 22 % (ref 22–35)
T4, Total: 14.1 ug/dL — ABNORMAL HIGH (ref 5.1–11.9)
TSH: 2.7 mIU/L (ref 0.40–4.50)

## 2017-06-22 ENCOUNTER — Other Ambulatory Visit: Payer: Medicare Other

## 2017-06-28 DIAGNOSIS — R350 Frequency of micturition: Secondary | ICD-10-CM | POA: Diagnosis not present

## 2017-06-28 DIAGNOSIS — N8111 Cystocele, midline: Secondary | ICD-10-CM | POA: Diagnosis not present

## 2017-06-29 ENCOUNTER — Other Ambulatory Visit: Payer: Medicare Other

## 2017-06-30 ENCOUNTER — Other Ambulatory Visit: Payer: Self-pay | Admitting: Internal Medicine

## 2017-07-20 ENCOUNTER — Other Ambulatory Visit: Payer: Self-pay | Admitting: Internal Medicine

## 2017-07-20 DIAGNOSIS — R350 Frequency of micturition: Secondary | ICD-10-CM | POA: Diagnosis not present

## 2017-07-20 DIAGNOSIS — N3946 Mixed incontinence: Secondary | ICD-10-CM | POA: Diagnosis not present

## 2017-07-20 DIAGNOSIS — I10 Essential (primary) hypertension: Secondary | ICD-10-CM

## 2017-07-20 DIAGNOSIS — K219 Gastro-esophageal reflux disease without esophagitis: Secondary | ICD-10-CM

## 2017-07-26 DIAGNOSIS — N8111 Cystocele, midline: Secondary | ICD-10-CM | POA: Diagnosis not present

## 2017-07-26 DIAGNOSIS — N3946 Mixed incontinence: Secondary | ICD-10-CM | POA: Diagnosis not present

## 2017-08-10 ENCOUNTER — Telehealth: Payer: Self-pay | Admitting: Internal Medicine

## 2017-08-10 NOTE — Telephone Encounter (Signed)
Copied from [place]. Topic: Quick Communication - See Telephone Encounter >> Aug 10, 2017  2:03 PM Burnis Medin, NT wrote: CRM for notification. See Telephone encounter for: Joycelyn Schmid Is calling because they have trying to reach the patient and she has not returned any of there calls so they are going to close her referral.   08/10/17.

## 2017-09-07 ENCOUNTER — Ambulatory Visit: Payer: Medicare Other | Admitting: Internal Medicine

## 2017-09-08 ENCOUNTER — Ambulatory Visit (INDEPENDENT_AMBULATORY_CARE_PROVIDER_SITE_OTHER): Payer: Medicare Other | Admitting: Internal Medicine

## 2017-09-08 ENCOUNTER — Encounter: Payer: Self-pay | Admitting: Internal Medicine

## 2017-09-08 ENCOUNTER — Other Ambulatory Visit: Payer: Self-pay | Admitting: Internal Medicine

## 2017-09-08 ENCOUNTER — Other Ambulatory Visit (INDEPENDENT_AMBULATORY_CARE_PROVIDER_SITE_OTHER): Payer: Medicare Other

## 2017-09-08 ENCOUNTER — Telehealth: Payer: Self-pay | Admitting: Internal Medicine

## 2017-09-08 ENCOUNTER — Ambulatory Visit: Payer: Medicare Other | Admitting: Internal Medicine

## 2017-09-08 VITALS — BP 156/84 | HR 96 | Temp 98.9°F | Resp 16 | Ht 63.0 in | Wt 195.0 lb

## 2017-09-08 DIAGNOSIS — E519 Thiamine deficiency, unspecified: Secondary | ICD-10-CM | POA: Diagnosis not present

## 2017-09-08 DIAGNOSIS — R3129 Other microscopic hematuria: Secondary | ICD-10-CM | POA: Diagnosis not present

## 2017-09-08 DIAGNOSIS — N183 Chronic kidney disease, stage 3 unspecified: Secondary | ICD-10-CM | POA: Insufficient documentation

## 2017-09-08 DIAGNOSIS — J449 Chronic obstructive pulmonary disease, unspecified: Secondary | ICD-10-CM | POA: Diagnosis not present

## 2017-09-08 DIAGNOSIS — J410 Simple chronic bronchitis: Secondary | ICD-10-CM | POA: Diagnosis not present

## 2017-09-08 DIAGNOSIS — I10 Essential (primary) hypertension: Secondary | ICD-10-CM

## 2017-09-08 DIAGNOSIS — D539 Nutritional anemia, unspecified: Secondary | ICD-10-CM

## 2017-09-08 DIAGNOSIS — M5412 Radiculopathy, cervical region: Secondary | ICD-10-CM

## 2017-09-08 DIAGNOSIS — I712 Thoracic aortic aneurysm, without rupture, unspecified: Secondary | ICD-10-CM | POA: Insufficient documentation

## 2017-09-08 DIAGNOSIS — L97929 Non-pressure chronic ulcer of unspecified part of left lower leg with unspecified severity: Secondary | ICD-10-CM | POA: Diagnosis not present

## 2017-09-08 DIAGNOSIS — L97922 Non-pressure chronic ulcer of unspecified part of left lower leg with fat layer exposed: Secondary | ICD-10-CM

## 2017-09-08 DIAGNOSIS — K635 Polyp of colon: Secondary | ICD-10-CM | POA: Diagnosis not present

## 2017-09-08 DIAGNOSIS — M4692 Unspecified inflammatory spondylopathy, cervical region: Secondary | ICD-10-CM

## 2017-09-08 DIAGNOSIS — I700 Atherosclerosis of aorta: Secondary | ICD-10-CM

## 2017-09-08 LAB — URINALYSIS, ROUTINE W REFLEX MICROSCOPIC
BILIRUBIN URINE: NEGATIVE
HGB URINE DIPSTICK: NEGATIVE
KETONES UR: NEGATIVE
LEUKOCYTES UA: NEGATIVE
NITRITE: NEGATIVE
RBC / HPF: NONE SEEN (ref 0–?)
Total Protein, Urine: NEGATIVE
URINE GLUCOSE: NEGATIVE
UROBILINOGEN UA: 0.2 (ref 0.0–1.0)
pH: 5.5 (ref 5.0–8.0)

## 2017-09-08 LAB — IBC PANEL
IRON: 108 ug/dL (ref 42–145)
Saturation Ratios: 32.7 % (ref 20.0–50.0)
TRANSFERRIN: 236 mg/dL (ref 212.0–360.0)

## 2017-09-08 LAB — BASIC METABOLIC PANEL
BUN: 16 mg/dL (ref 6–23)
CHLORIDE: 98 meq/L (ref 96–112)
CO2: 26 meq/L (ref 19–32)
CREATININE: 1.33 mg/dL — AB (ref 0.40–1.20)
Calcium: 8.8 mg/dL (ref 8.4–10.5)
GFR: 43.1 mL/min — ABNORMAL LOW (ref >60.00)
Glucose, Bld: 120 mg/dL — ABNORMAL HIGH (ref 70–99)
POTASSIUM: 3.7 meq/L (ref 3.5–5.1)
Sodium: 133 mEq/L — ABNORMAL LOW (ref 135–145)

## 2017-09-08 LAB — CBC WITH DIFFERENTIAL/PLATELET
BASOS PCT: 1.6 % (ref 0.0–3.0)
Basophils Absolute: 0.1 10*3/uL (ref 0.0–0.1)
EOS ABS: 0.1 10*3/uL (ref 0.0–0.7)
EOS PCT: 1 % (ref 0.0–5.0)
HCT: 37.7 % (ref 36.0–46.0)
HEMOGLOBIN: 13 g/dL (ref 12.0–15.0)
LYMPHS ABS: 2.1 10*3/uL (ref 0.7–4.0)
Lymphocytes Relative: 28.4 % (ref 12.0–46.0)
MCHC: 34.4 g/dL (ref 30.0–36.0)
MCV: 98.7 fl (ref 78.0–100.0)
MONO ABS: 0.4 10*3/uL (ref 0.1–1.0)
Monocytes Relative: 5 % (ref 3.0–12.0)
NEUTROS ABS: 4.7 10*3/uL (ref 1.4–7.7)
Neutrophils Relative %: 64 % (ref 43.0–77.0)
PLATELETS: 261 10*3/uL (ref 150.0–400.0)
RBC: 3.82 Mil/uL — ABNORMAL LOW (ref 3.87–5.11)
RDW: 13.4 % (ref 11.5–15.5)
WBC: 7.3 10*3/uL (ref 4.0–10.5)

## 2017-09-08 MED ORDER — IRBESARTAN 300 MG PO TABS: ORAL_TABLET | ORAL | 1 refills | Status: AC

## 2017-09-08 MED ORDER — GLYCOPYRROLATE 25 MCG/ML IN SOLN: 1.0000 | Freq: Two times a day (BID) | RESPIRATORY_TRACT | 11 refills | Status: DC

## 2017-09-08 MED ORDER — GLYCOPYRROLATE 25 MCG/ML IN SOLN: 1.0000 | Freq: Two times a day (BID) | RESPIRATORY_TRACT | 11 refills | Status: AC

## 2017-09-08 MED ORDER — OXYCODONE HCL 15 MG PO TABS: 15.0000 mg | ORAL_TABLET | ORAL | 0 refills | Status: DC | PRN

## 2017-09-08 MED ORDER — ARFORMOTEROL TARTRATE 15 MCG/2ML IN NEBU: 15.0000 ug | INHALATION_SOLUTION | Freq: Two times a day (BID) | RESPIRATORY_TRACT | 11 refills | Status: AC

## 2017-09-08 MED ORDER — GLYCOPYRROLATE 25 MCG/ML IN SOLN
1.0000 | Freq: Two times a day (BID) | RESPIRATORY_TRACT | 11 refills | Status: DC
Start: 2017-09-08 — End: 2017-09-08

## 2017-09-08 NOTE — Telephone Encounter (Signed)
Reviewed chart pt is up-to-date sent refills to [facility].../lmb  

## 2017-09-08 NOTE — Telephone Encounter (Signed)
Copied from [place] [id number]. Topic: General - Other >> Sep 08, 2017  3:18 PM Darl Householder, RMA wrote: Reason for CRM: Medication refill request for Irbesartan 300 mg to be sen to [facility]

## 2017-09-08 NOTE — Telephone Encounter (Signed)
Copied from [place] [phone]. Topic: General - Other >> Sep 08, 2017  3:16 PM Darl Householder, RMA wrote: Reason for CRM: patient is requesting a callback concerning medication that was sent to pharmacy today Glycopyrrolate, pt states Dr. Ronnald Ramp said medication would be free however pharmacy says medication is not covered under insurance and pt would like a call back as what she needs to do

## 2017-09-08 NOTE — Patient Instructions (Signed)

## 2017-09-08 NOTE — Progress Notes (Signed)
 Subjective:  Patient ID: Brooke Garcia, female    DOB: 08/24/1956  Age: 61 y.o. MRN: [id number]  CC: Anemia and Hypertension   HPI Brooke Garcia presents for f/up - she complains of worsening SOB, with DOE, and wheezing.  She is trying to use a hand held inhaler but is not getting much response.  She has tried to used several hand held inhaled inhalers and has trouble using them.  She gets a better response from nebulizer and tells me that nebulized Brovana has been helping.  She denies any recent episodes of chest pain, cough, or hemoptysis.  Outpatient Medications Prior to Visit  Medication Sig Dispense Refill  . ALPRAZolam (XANAX) 1 MG tablet Take 1 tablet (1 mg total) by mouth 2 (two) times daily as needed for anxiety. 60 tablet 3  . chlorthalidone (HYGROTON) 25 MG tablet TAKE 1 TABLET(25 MG) BY MOUTH DAILY 90 tablet 0  . pantoprazole (PROTONIX) 40 MG tablet TAKE 1 TABLET(40 MG) BY MOUTH DAILY 90 tablet 1  . Rivaroxaban (XARELTO) 15 MG TABS tablet TAKE 1 TABLET(15 MG) BY MOUTH DAILY WITH SUPPER 90 tablet 1  . arformoterol (BROVANA) 15 MCG/2ML NEBU Take 2 mLs (15 mcg total) by nebulization 2 (two) times daily as needed. 120 mL 11  . Glycopyrrolate-Formoterol (BEVESPI AEROSPHERE) 9-4.8 MCG/ACT AERO Inhale 2 puffs into the lungs 2 (two) times daily. 10.7 g 11  . irbesartan (AVAPRO) 300 MG tablet TAKE 1 TABLET(300 MG) BY MOUTH DAILY 90 tablet 1  . irbesartan (AVAPRO) 300 MG tablet TAKE 1 TABLET(300 MG) BY MOUTH DAILY 90 tablet 0  . oxyCODONE (ROXICODONE) 15 MG immediate release tablet Take 1 tablet (15 mg total) by mouth every 4 (four) hours as needed for pain. 120 tablet 0  . pantoprazole (PROTONIX) 40 MG tablet TAKE 1 TABLET(40 MG) BY MOUTH DAILY 90 tablet 1   No facility-administered medications prior to visit.     ROS Review of Systems  Constitutional: Negative.  Negative for appetite change, diaphoresis, fatigue and unexpected weight change.  HENT: Negative.  Negative for sinus  pressure and trouble swallowing.   Eyes: Negative for visual disturbance.  Respiratory: Positive for shortness of breath and wheezing. Negative for cough, chest tightness and stridor.   Cardiovascular: Negative for chest pain, palpitations and leg swelling.  Gastrointestinal: Negative for abdominal pain, constipation, diarrhea, nausea and vomiting.  Endocrine: Negative.   Genitourinary: Negative.  Negative for difficulty urinating, dysuria, frequency, hematuria and vaginal bleeding.  Musculoskeletal: Positive for neck pain. Negative for arthralgias, back pain and gait problem.  Skin: Negative.  Negative for color change, rash and wound.  Allergic/Immunologic: Negative.   Neurological: Negative.  Negative for dizziness, weakness, numbness and headaches.  Hematological: Negative for adenopathy. Does not bruise/bleed easily.  Psychiatric/Behavioral: Negative.     Objective:  BP (!) 156/84 (BP Location: Left Arm, Patient Position: Sitting, Cuff Size: Normal)   Pulse 96   Temp 98.9 F (37.2 C) (Oral)   Resp 16   Ht 5' 3" (1.6 m)   Wt 195 lb 0.6 oz (88.5 kg)   LMP  (Approximate)   SpO2 100%   BMI 34.55 kg/m   BP Readings from Last 3 Encounters:  09/08/17 (!) 156/84  06/09/17 138/88  03/07/17 136/80    Wt Readings from Last 3 Encounters:  09/08/17 195 lb 0.6 oz (88.5 kg)  06/09/17 196 lb (88.9 kg)  03/07/17 172 lb 4 oz (78.1 kg)    Physical Exam  Constitutional: She is   oriented to person, place, and time. No distress.  HENT:  Mouth/Throat: Oropharynx is clear and moist. No oropharyngeal exudate.  Eyes: Conjunctivae are normal. Left eye exhibits no discharge. No scleral icterus.  Neck: Normal range of motion. Neck supple. No JVD present. No thyromegaly present.  Cardiovascular: Normal rate, regular rhythm and normal heart sounds. Exam reveals no gallop.  No murmur heard. Pulmonary/Chest: Effort normal and breath sounds normal. No respiratory distress. She has no wheezes. She  has no rales. She exhibits no tenderness.  Abdominal: Soft. Bowel sounds are normal. She exhibits no distension and no mass. There is no tenderness. There is no rebound and no guarding.  Musculoskeletal: Normal range of motion. She exhibits no edema or tenderness.  Lymphadenopathy:    She has no cervical adenopathy.  Neurological: She is alert and oriented to person, place, and time.  Skin: Skin is warm and dry. No rash noted. She is not diaphoretic. No erythema. No pallor.  Vitals reviewed.   Lab Results  Component Value Date   WBC 7.3 09/08/2017   HGB 13.0 09/08/2017   HCT 37.7 09/08/2017   PLT 261.0 09/08/2017   GLUCOSE 120 (H) 09/08/2017   CHOL 143 06/09/2017   TRIG 96.0 06/09/2017   HDL 61.00 06/09/2017   LDLCALC 63 06/09/2017   ALT 33 06/09/2017   AST 38 (H) 06/09/2017   NA 133 (L) 09/08/2017   K 3.7 09/08/2017   CL 98 09/08/2017   CREATININE 1.33 (H) 09/08/2017   BUN 16 09/08/2017   CO2 26 09/08/2017   TSH 2.70 06/09/2017   INR 1.72 (H) 09/08/2015   HGBA1C 4.4 (L) 07/15/2016    Mm Screening Breast Tomo Bilateral  Result Date: 04/20/2017 CLINICAL DATA:  Screening. EXAM: 2D DIGITAL SCREENING BILATERAL MAMMOGRAM WITH CAD AND ADJUNCT TOMO COMPARISON:  Previous exam(s). ACR Breast Density Category c: The breast tissue is heterogeneously dense, which may obscure small masses. FINDINGS: There are no findings suspicious for malignancy. Images were processed with CAD. IMPRESSION: No mammographic evidence of malignancy. A result letter of this screening mammogram will be mailed directly to the patient. RECOMMENDATION: Screening mammogram in one year. (Code:SM-B-01Y) BI-RADS CATEGORY  1: Negative. Electronically Signed   By: Drew  Davis M.D.   On: 04/20/2017 08:19    Assessment & Plan:   Brooke Garcia was seen today for anemia and hypertension.  Diagnoses and all orders for this visit:  HYPERTENSION, BENIGN ESSENTIAL- Her blood pressure is adequately well controlled. -     Basic  metabolic panel; Future  Other microscopic hematuria- This has resolved -     Urinalysis, Routine w reflex microscopic; Future  COPD, moderate (HCC)- She will continue the nebulized LABA but we will also upgrade to a nebulized LAMA.  She does not have frequent exacerbations so at this time will not recommend an ICS but may add that on in the future. -     Discontinue: Glycopyrrolate (LONHAL[place] MAGNAIR REFILL KIT) 25 MCG/ML SOLN; Inhale 1 Act into the lungs 2 (two) times daily. -     Discontinue: Glycopyrrolate (LONHALA MAGNAIR STARTER KIT) 25 MCG/ML SOLN; Inhale 1 Act into the lungs 2 (two) times daily. -     arformoterol (BROVANA) 15 MCG/2ML NEBU; Take 2 mLs (15 mcg total) by nebulization 2 (two) times daily.  Cervical spondylitis with radiculitis (HCC) -     oxyCODONE (ROXICODONE) 15 MG immediate release tablet; Take 1 tablet (15 mg total) by mouth every 4 (four) hours as needed for pain.  Leg   ulcer, left, with unspecified severity (HCC) -     oxyCODONE (ROXICODONE) 15 MG immediate release tablet; Take 1 tablet (15 mg total) by mouth every 4 (four) hours as needed for pain.  Leg ulcer, left, with fat layer exposed (HCC) -     oxyCODONE (ROXICODONE) 15 MG immediate release tablet; Take 1 tablet (15 mg total) by mouth every 4 (four) hours as needed for pain.  Chronic renal disease, stage 3, moderately decreased glomerular filtration rate (GFR) between 30-59 mL/min/1.73 square meter (HCC)- Her renal function is stable.  She will continue to avoid nephrotoxic agents. -     Basic metabolic panel; Future  Deficiency anemia- Her H&H are normal now but her vitamin B1 level is undetectable.  Will start a B1 supplement.  The other vitamin levels are all normal. -     CBC with Differential/Platelet; Future -     Vitamin B12; Future -     IBC panel; Future -     Vitamin B1; Future -     Folate; Future -     Ferritin; Future  Simple chronic bronchitis (HCC)  Thoracic aortic aneurysm without  rupture (HCC)- She is due for follow-up scan on this to see if it has enlarged.  The last measurement was about 4 cm. -     Cancel: CT Angio Chest W/Cm &/Or Wo Cm; Future -     CT ANGIO CHEST AORTA W/CM &/OR WO/CM; Future  Thiamine deficiency -     thiamine (VITAMIN B-1) 100 MG tablet; Take 1 tablet (100 mg total) by mouth daily.   I have discontinued Brooke Garcia's Glycopyrrolate-Formoterol and irbesartan. I have also changed her arformoterol. Additionally, I am having her start on thiamine. Lastly, I am having her maintain her ALPRAZolam, pantoprazole, Rivaroxaban, chlorthalidone, and oxyCODONE.  Meds ordered this encounter  Medications  . DISCONTD: Glycopyrrolate (LONHALA MAGNAIR REFILL KIT) 25 MCG/ML SOLN    Sig: Inhale 1 Act into the lungs 2 (two) times daily.    Dispense:  60 mL    Refill:  11  . DISCONTD: Glycopyrrolate (LONHALA MAGNAIR STARTER KIT) 25 MCG/ML SOLN    Sig: Inhale 1 Act into the lungs 2 (two) times daily.    Dispense:  60 mL    Refill:  11  . oxyCODONE (ROXICODONE) 15 MG immediate release tablet    Sig: Take 1 tablet (15 mg total) by mouth every 4 (four) hours as needed for pain.    Dispense:  120 tablet    Refill:  0  . arformoterol (BROVANA) 15 MCG/2ML NEBU    Sig: Take 2 mLs (15 mcg total) by nebulization 2 (two) times daily.    Dispense:  120 mL    Refill:  11  . thiamine (VITAMIN B-1) 100 MG tablet    Sig: Take 1 tablet (100 mg total) by mouth daily.    Dispense:  90 tablet    Refill:  1     Follow-up: Return in about 4 months (around 01/07/2018).  Thomas Jones, MD 

## 2017-09-09 LAB — FERRITIN: FERRITIN: 247.1 ng/mL (ref 10.0–291.0)

## 2017-09-09 LAB — FOLATE

## 2017-09-09 LAB — VITAMIN B12: Vitamin B-12: 402 pg/mL (ref 211–911)

## 2017-09-09 NOTE — Telephone Encounter (Signed)
I have been speaking with the drug rep of [facility]. We are looking for a free trial card for pt to get her medication at her local drug store.

## 2017-09-12 ENCOUNTER — Encounter: Payer: Self-pay | Admitting: Internal Medicine

## 2017-09-12 DIAGNOSIS — E519 Thiamine deficiency, unspecified: Secondary | ICD-10-CM | POA: Insufficient documentation

## 2017-09-12 LAB — VITAMIN B1: Vitamin B1 (Thiamine): <6 nmol/L — ABNORMAL LOW (ref 8–30)

## 2017-09-12 MED ORDER — VITAMIN B-1 100 MG PO TABS: 100.0000 mg | ORAL_TABLET | Freq: Every day | ORAL | 1 refills | Status: AC

## 2017-09-14 ENCOUNTER — Other Ambulatory Visit: Payer: Self-pay | Admitting: Internal Medicine

## 2017-09-14 ENCOUNTER — Ambulatory Visit: Payer: Medicare Other | Admitting: Internal Medicine

## 2017-09-16 ENCOUNTER — Inpatient Hospital Stay: Admission: RE | Admit: 2017-09-16 | Payer: Medicare Other | Source: Ambulatory Visit

## 2017-09-16 NOTE — Telephone Encounter (Signed)
[facility] contacted. Sent OV notes for PA and free trial offer for patient.  LVM for pt to call back as soon as possible.

## 2017-09-22 ENCOUNTER — Inpatient Hospital Stay: Admission: RE | Admit: 2017-09-22 | Payer: Medicare Other | Source: Ambulatory Visit

## 2017-09-22 ENCOUNTER — Other Ambulatory Visit: Payer: Self-pay | Admitting: Internal Medicine

## 2017-09-22 DIAGNOSIS — J449 Chronic obstructive pulmonary disease, unspecified: Secondary | ICD-10-CM

## 2017-10-05 ENCOUNTER — Ambulatory Visit (INDEPENDENT_AMBULATORY_CARE_PROVIDER_SITE_OTHER)
Admission: RE | Admit: 2017-10-05 | Discharge: 2017-10-05 | Disposition: A | Discharge status: Home / Self Care | Payer: Medicare Other | Source: Ambulatory Visit | Attending: Internal Medicine | Admitting: Internal Medicine

## 2017-10-05 ENCOUNTER — Telehealth: Payer: Self-pay | Admitting: Internal Medicine

## 2017-10-05 ENCOUNTER — Other Ambulatory Visit: Payer: Self-pay | Admitting: Internal Medicine

## 2017-10-05 DIAGNOSIS — I712 Thoracic aortic aneurysm, without rupture, unspecified: Secondary | ICD-10-CM

## 2017-10-05 DIAGNOSIS — M4692 Unspecified inflammatory spondylopathy, cervical region: Secondary | ICD-10-CM

## 2017-10-05 DIAGNOSIS — M5412 Radiculopathy, cervical region: Principal | ICD-10-CM

## 2017-10-05 DIAGNOSIS — I700 Atherosclerosis of aorta: Secondary | ICD-10-CM | POA: Insufficient documentation

## 2017-10-05 DIAGNOSIS — R350 Frequency of micturition: Secondary | ICD-10-CM | POA: Diagnosis not present

## 2017-10-05 DIAGNOSIS — N8111 Cystocele, midline: Secondary | ICD-10-CM | POA: Diagnosis not present

## 2017-10-05 DIAGNOSIS — L97929 Non-pressure chronic ulcer of unspecified part of left lower leg with unspecified severity: Secondary | ICD-10-CM

## 2017-10-05 DIAGNOSIS — L97922 Non-pressure chronic ulcer of unspecified part of left lower leg with fat layer exposed: Secondary | ICD-10-CM

## 2017-10-05 MED ORDER — OXYCODONE HCL 15 MG PO TABS: 15.0000 mg | ORAL_TABLET | ORAL | 0 refills | Status: DC | PRN

## 2017-10-05 MED ORDER — IOPAMIDOL (ISOVUE-370) INJECTION 76%
100.0000 mL | Freq: Once | INTRAVENOUS | Status: AC | PRN
  Administered 2017-10-05: 100 mL via INTRAVENOUS

## 2017-10-05 NOTE — Telephone Encounter (Signed)
Copied from [place]. Topic: Quick Communication - Rx Refill/Question >> Oct 05, 2017  2:25 PM Corie Chiquito, [place] wrote: Medication: Oxycodone   Has the patient contacted their pharmacy? Yes  Patient has been in touch with the pharmacy and was told that they can't send over the refill request. Stated that Dr.Jones would have to send over the request so that she would be able to get her medications refilled   Preferred Pharmacy (with phone number or street name):[facility] [place]   Agent: Please be advised that RX refills may take up to 3 business days. We ask that you follow-up with your pharmacy.

## 2017-10-05 NOTE — Telephone Encounter (Signed)
Requesting refill of Oxycodone  LOV 09/08/17 with Dr. Ronnald Ramp  [facility] 09/08/17  #[id number].

## 2017-10-06 ENCOUNTER — Other Ambulatory Visit: Payer: Self-pay | Admitting: Internal Medicine

## 2017-10-06 DIAGNOSIS — L97922 Non-pressure chronic ulcer of unspecified part of left lower leg with fat layer exposed: Secondary | ICD-10-CM

## 2017-10-06 DIAGNOSIS — L97929 Non-pressure chronic ulcer of unspecified part of left lower leg with unspecified severity: Secondary | ICD-10-CM

## 2017-10-06 DIAGNOSIS — M4692 Unspecified inflammatory spondylopathy, cervical region: Secondary | ICD-10-CM

## 2017-10-06 DIAGNOSIS — M5412 Radiculopathy, cervical region: Principal | ICD-10-CM

## 2017-10-06 MED ORDER — OXYCODONE HCL 15 MG PO TABS: 15.0000 mg | ORAL_TABLET | ORAL | 0 refills | Status: AC | PRN

## 2017-10-06 NOTE — Telephone Encounter (Signed)
Rx for pain med [facility] to [facility]. Pt is now using the [facility] in [place].Updated pharmacy need script resent to correct one.. Marland KitchenJohny Garcia

## 2017-10-06 NOTE — Telephone Encounter (Signed)
MD resent to correct pharmacy.Marland KitchenJohny Garcia

## 2017-10-09 ENCOUNTER — Other Ambulatory Visit: Payer: Self-pay | Admitting: Internal Medicine

## 2017-10-09 ENCOUNTER — Encounter: Payer: Self-pay | Admitting: Internal Medicine

## 2017-10-09 DIAGNOSIS — I272 Pulmonary hypertension, unspecified: Secondary | ICD-10-CM | POA: Insufficient documentation

## 2017-10-09 DIAGNOSIS — I712 Thoracic aortic aneurysm, without rupture, unspecified: Secondary | ICD-10-CM

## 2017-10-26 ENCOUNTER — Encounter (HOSPITAL_COMMUNITY): Payer: Self-pay

## 2017-10-26 ENCOUNTER — Emergency Department (HOSPITAL_COMMUNITY): Payer: Medicare Other

## 2017-10-26 ENCOUNTER — Inpatient Hospital Stay (HOSPITAL_COMMUNITY)
Admission: EM | Admit: 2017-10-26 | Discharge: 2017-11-11 | DRG: 871 | Disposition: E | Payer: Medicare Other | Attending: Family Medicine | Admitting: Family Medicine

## 2017-10-26 DIAGNOSIS — Z886 Allergy status to analgesic agent status: Secondary | ICD-10-CM

## 2017-10-26 DIAGNOSIS — N183 Chronic kidney disease, stage 3 unspecified: Secondary | ICD-10-CM | POA: Diagnosis present

## 2017-10-26 DIAGNOSIS — I10 Essential (primary) hypertension: Secondary | ICD-10-CM | POA: Diagnosis not present

## 2017-10-26 DIAGNOSIS — A419 Sepsis, unspecified organism: Secondary | ICD-10-CM

## 2017-10-26 DIAGNOSIS — L89892 Pressure ulcer of other site, stage 2: Secondary | ICD-10-CM | POA: Diagnosis present

## 2017-10-26 DIAGNOSIS — B182 Chronic viral hepatitis C: Secondary | ICD-10-CM | POA: Diagnosis present

## 2017-10-26 DIAGNOSIS — K8020 Calculus of gallbladder without cholecystitis without obstruction: Secondary | ICD-10-CM | POA: Diagnosis not present

## 2017-10-26 DIAGNOSIS — K7200 Acute and subacute hepatic failure without coma: Secondary | ICD-10-CM | POA: Diagnosis present

## 2017-10-26 DIAGNOSIS — R933 Abnormal findings on diagnostic imaging of other parts of digestive tract: Secondary | ICD-10-CM

## 2017-10-26 DIAGNOSIS — R402214 Coma scale, best verbal response, none, 24 hours or more after hospital admission: Secondary | ICD-10-CM | POA: Diagnosis not present

## 2017-10-26 DIAGNOSIS — R197 Diarrhea, unspecified: Secondary | ICD-10-CM | POA: Diagnosis not present

## 2017-10-26 DIAGNOSIS — J9600 Acute respiratory failure, unspecified whether with hypoxia or hypercapnia: Secondary | ICD-10-CM

## 2017-10-26 DIAGNOSIS — E785 Hyperlipidemia, unspecified: Secondary | ICD-10-CM | POA: Diagnosis present

## 2017-10-26 DIAGNOSIS — Z885 Allergy status to narcotic agent status: Secondary | ICD-10-CM

## 2017-10-26 DIAGNOSIS — E872 Acidosis, unspecified: Secondary | ICD-10-CM

## 2017-10-26 DIAGNOSIS — K922 Gastrointestinal hemorrhage, unspecified: Secondary | ICD-10-CM | POA: Diagnosis not present

## 2017-10-26 DIAGNOSIS — J449 Chronic obstructive pulmonary disease, unspecified: Secondary | ICD-10-CM | POA: Diagnosis present

## 2017-10-26 DIAGNOSIS — R571 Hypovolemic shock: Secondary | ICD-10-CM | POA: Diagnosis not present

## 2017-10-26 DIAGNOSIS — M6282 Rhabdomyolysis: Secondary | ICD-10-CM

## 2017-10-26 DIAGNOSIS — K661 Hemoperitoneum: Secondary | ICD-10-CM | POA: Diagnosis not present

## 2017-10-26 DIAGNOSIS — A4102 Sepsis due to Methicillin resistant Staphylococcus aureus: Secondary | ICD-10-CM | POA: Diagnosis present

## 2017-10-26 DIAGNOSIS — R0602 Shortness of breath: Secondary | ICD-10-CM

## 2017-10-26 DIAGNOSIS — R6520 Severe sepsis without septic shock: Secondary | ICD-10-CM | POA: Diagnosis present

## 2017-10-26 DIAGNOSIS — N189 Chronic kidney disease, unspecified: Secondary | ICD-10-CM

## 2017-10-26 DIAGNOSIS — K8000 Calculus of gallbladder with acute cholecystitis without obstruction: Secondary | ICD-10-CM | POA: Diagnosis present

## 2017-10-26 DIAGNOSIS — R402314 Coma scale, best motor response, none, 24 hours or more after hospital admission: Secondary | ICD-10-CM | POA: Diagnosis not present

## 2017-10-26 DIAGNOSIS — D6859 Other primary thrombophilia: Secondary | ICD-10-CM | POA: Diagnosis present

## 2017-10-26 DIAGNOSIS — Z4682 Encounter for fitting and adjustment of non-vascular catheter: Secondary | ICD-10-CM | POA: Diagnosis not present

## 2017-10-26 DIAGNOSIS — Z86718 Personal history of other venous thrombosis and embolism: Secondary | ICD-10-CM

## 2017-10-26 DIAGNOSIS — N170 Acute kidney failure with tubular necrosis: Secondary | ICD-10-CM | POA: Diagnosis not present

## 2017-10-26 DIAGNOSIS — R092 Respiratory arrest: Secondary | ICD-10-CM | POA: Diagnosis not present

## 2017-10-26 DIAGNOSIS — R9389 Abnormal findings on diagnostic imaging of other specified body structures: Secondary | ICD-10-CM

## 2017-10-26 DIAGNOSIS — R578 Other shock: Secondary | ICD-10-CM | POA: Diagnosis not present

## 2017-10-26 DIAGNOSIS — Z66 Do not resuscitate: Secondary | ICD-10-CM | POA: Diagnosis present

## 2017-10-26 DIAGNOSIS — M1990 Unspecified osteoarthritis, unspecified site: Secondary | ICD-10-CM | POA: Diagnosis present

## 2017-10-26 DIAGNOSIS — I129 Hypertensive chronic kidney disease with stage 1 through stage 4 chronic kidney disease, or unspecified chronic kidney disease: Secondary | ICD-10-CM | POA: Diagnosis present

## 2017-10-26 DIAGNOSIS — N814 Uterovaginal prolapse, unspecified: Secondary | ICD-10-CM | POA: Diagnosis present

## 2017-10-26 DIAGNOSIS — Z466 Encounter for fitting and adjustment of urinary device: Secondary | ICD-10-CM | POA: Diagnosis not present

## 2017-10-26 DIAGNOSIS — Z79899 Other long term (current) drug therapy: Secondary | ICD-10-CM

## 2017-10-26 DIAGNOSIS — E669 Obesity, unspecified: Secondary | ICD-10-CM | POA: Diagnosis present

## 2017-10-26 DIAGNOSIS — D649 Anemia, unspecified: Secondary | ICD-10-CM | POA: Diagnosis present

## 2017-10-26 DIAGNOSIS — F17200 Nicotine dependence, unspecified, uncomplicated: Secondary | ICD-10-CM | POA: Diagnosis present

## 2017-10-26 DIAGNOSIS — G9341 Metabolic encephalopathy: Secondary | ICD-10-CM | POA: Diagnosis present

## 2017-10-26 DIAGNOSIS — S299XXA Unspecified injury of thorax, initial encounter: Secondary | ICD-10-CM | POA: Diagnosis not present

## 2017-10-26 DIAGNOSIS — R195 Other fecal abnormalities: Secondary | ICD-10-CM

## 2017-10-26 DIAGNOSIS — I959 Hypotension, unspecified: Secondary | ICD-10-CM | POA: Diagnosis not present

## 2017-10-26 DIAGNOSIS — F411 Generalized anxiety disorder: Secondary | ICD-10-CM | POA: Diagnosis present

## 2017-10-26 DIAGNOSIS — F1010 Alcohol abuse, uncomplicated: Secondary | ICD-10-CM | POA: Diagnosis not present

## 2017-10-26 DIAGNOSIS — S3993XA Unspecified injury of pelvis, initial encounter: Secondary | ICD-10-CM | POA: Diagnosis not present

## 2017-10-26 DIAGNOSIS — J9601 Acute respiratory failure with hypoxia: Secondary | ICD-10-CM | POA: Diagnosis not present

## 2017-10-26 DIAGNOSIS — K7010 Alcoholic hepatitis without ascites: Secondary | ICD-10-CM | POA: Diagnosis not present

## 2017-10-26 DIAGNOSIS — F17210 Nicotine dependence, cigarettes, uncomplicated: Secondary | ICD-10-CM | POA: Diagnosis present

## 2017-10-26 DIAGNOSIS — E860 Dehydration: Secondary | ICD-10-CM | POA: Diagnosis present

## 2017-10-26 DIAGNOSIS — R4182 Altered mental status, unspecified: Secondary | ICD-10-CM

## 2017-10-26 DIAGNOSIS — K551 Chronic vascular disorders of intestine: Secondary | ICD-10-CM | POA: Diagnosis present

## 2017-10-26 DIAGNOSIS — N8111 Cystocele, midline: Secondary | ICD-10-CM | POA: Diagnosis not present

## 2017-10-26 DIAGNOSIS — I712 Thoracic aortic aneurysm, without rupture: Secondary | ICD-10-CM | POA: Diagnosis not present

## 2017-10-26 DIAGNOSIS — K767 Hepatorenal syndrome: Secondary | ICD-10-CM | POA: Diagnosis present

## 2017-10-26 DIAGNOSIS — S0990XA Unspecified injury of head, initial encounter: Secondary | ICD-10-CM | POA: Diagnosis not present

## 2017-10-26 DIAGNOSIS — T68XXXA Hypothermia, initial encounter: Secondary | ICD-10-CM | POA: Diagnosis not present

## 2017-10-26 DIAGNOSIS — Z8249 Family history of ischemic heart disease and other diseases of the circulatory system: Secondary | ICD-10-CM

## 2017-10-26 DIAGNOSIS — Z789 Other specified health status: Secondary | ICD-10-CM

## 2017-10-26 DIAGNOSIS — N179 Acute kidney failure, unspecified: Secondary | ICD-10-CM

## 2017-10-26 DIAGNOSIS — I716 Thoracoabdominal aortic aneurysm, without rupture: Secondary | ICD-10-CM | POA: Diagnosis present

## 2017-10-26 DIAGNOSIS — E876 Hypokalemia: Secondary | ICD-10-CM | POA: Diagnosis present

## 2017-10-26 DIAGNOSIS — R569 Unspecified convulsions: Secondary | ICD-10-CM | POA: Diagnosis not present

## 2017-10-26 DIAGNOSIS — K8010 Calculus of gallbladder with chronic cholecystitis without obstruction: Secondary | ICD-10-CM

## 2017-10-26 DIAGNOSIS — R404 Transient alteration of awareness: Secondary | ICD-10-CM | POA: Diagnosis not present

## 2017-10-26 DIAGNOSIS — Z6839 Body mass index (BMI) 39.0-39.9, adult: Secondary | ICD-10-CM

## 2017-10-26 DIAGNOSIS — W19XXXA Unspecified fall, initial encounter: Secondary | ICD-10-CM | POA: Diagnosis present

## 2017-10-26 DIAGNOSIS — R410 Disorientation, unspecified: Secondary | ICD-10-CM | POA: Diagnosis not present

## 2017-10-26 DIAGNOSIS — R32 Unspecified urinary incontinence: Secondary | ICD-10-CM | POA: Diagnosis present

## 2017-10-26 DIAGNOSIS — S199XXA Unspecified injury of neck, initial encounter: Secondary | ICD-10-CM | POA: Diagnosis not present

## 2017-10-26 DIAGNOSIS — R531 Weakness: Secondary | ICD-10-CM | POA: Diagnosis not present

## 2017-10-26 DIAGNOSIS — D72829 Elevated white blood cell count, unspecified: Secondary | ICD-10-CM

## 2017-10-26 DIAGNOSIS — R402114 Coma scale, eyes open, never, 24 hours or more after hospital admission: Secondary | ICD-10-CM | POA: Diagnosis not present

## 2017-10-26 DIAGNOSIS — K529 Noninfective gastroenteritis and colitis, unspecified: Secondary | ICD-10-CM

## 2017-10-26 DIAGNOSIS — E162 Hypoglycemia, unspecified: Secondary | ICD-10-CM | POA: Diagnosis not present

## 2017-10-26 DIAGNOSIS — I82403 Acute embolism and thrombosis of unspecified deep veins of lower extremity, bilateral: Secondary | ICD-10-CM | POA: Diagnosis present

## 2017-10-26 DIAGNOSIS — G936 Cerebral edema: Secondary | ICD-10-CM | POA: Diagnosis present

## 2017-10-26 DIAGNOSIS — Z7901 Long term (current) use of anticoagulants: Secondary | ICD-10-CM

## 2017-10-26 DIAGNOSIS — R748 Abnormal levels of other serum enzymes: Secondary | ICD-10-CM

## 2017-10-26 LAB — COMPREHENSIVE METABOLIC PANEL
ALBUMIN: 4.1 g/dL (ref 3.5–5.0)
ALK PHOS: 97 U/L (ref 38–126)
ALT: 97 U/L — ABNORMAL HIGH (ref 14–54)
AST: 245 U/L — AB (ref 15–41)
Anion gap: 26 — ABNORMAL HIGH (ref 5–15)
BILIRUBIN TOTAL: 1.6 mg/dL — AB (ref 0.3–1.2)
BUN: 58 mg/dL — AB (ref 6–20)
CALCIUM: 9.8 mg/dL (ref 8.9–10.3)
CO2: 18 mmol/L — AB (ref 22–32)
Chloride: 100 mmol/L — ABNORMAL LOW (ref 101–111)
Creatinine, Ser: 5.1 mg/dL — ABNORMAL HIGH (ref 0.44–1.00)
GFR calc Af Amer: 10 mL/min — ABNORMAL LOW (ref >60)
GFR calc non Af Amer: 8 mL/min — ABNORMAL LOW (ref >60)
GLUCOSE: 138 mg/dL — AB (ref 65–99)
POTASSIUM: 2.9 mmol/L — AB (ref 3.5–5.1)
SODIUM: 144 mmol/L (ref 135–145)
TOTAL PROTEIN: 9.4 g/dL — AB (ref 6.5–8.1)

## 2017-10-26 LAB — BLOOD GAS, ARTERIAL
ACID-BASE DEFICIT: UNDETERMINED mmol/L (ref 0.0–2.0)
ACID-BASE DEFICIT: 8.3 mmol/L — AB (ref 0.0–2.0)
ACID-BASE EXCESS: UNDETERMINED mmol/L (ref 0.0–2.0)
Bicarbonate: UNDETERMINED mmol/L (ref 20.0–28.0)
Bicarbonate: 13.8 mmol/L — ABNORMAL LOW (ref 20.0–28.0)
DRAWN BY: 270211
DRAWN BY: 257881
FIO2: 0.21
O2 CONTENT: 3 L/min
O2 SAT: 94 %
O2 SAT: 95.5 %
PATIENT TEMPERATURE: 93
PH ART: 7.421 (ref 7.350–7.450)
Patient temperature: 98.6
pCO2 arterial: 21.6 mmHg — ABNORMAL LOW (ref 32.0–48.0)
pH, Arterial: 7.476 — ABNORMAL HIGH (ref 7.350–7.450)
pO2, Arterial: 75.8 mmHg — ABNORMAL LOW (ref 83.0–108.0)
pO2, Arterial: 85.7 mmHg (ref 83.0–108.0)

## 2017-10-26 LAB — I-STAT CHEM 8, ED
BUN: 51 mg/dL — ABNORMAL HIGH (ref 6–20)
CHLORIDE: 103 mmol/L (ref 101–111)
Calcium, Ion: 1.02 mmol/L — ABNORMAL LOW (ref 1.15–1.40)
Creatinine, Ser: 4.9 mg/dL — ABNORMAL HIGH (ref 0.44–1.00)
GLUCOSE: 126 mg/dL — AB (ref 65–99)
HEMATOCRIT: 53 % — AB (ref 36.0–46.0)
HEMOGLOBIN: 18 g/dL — AB (ref 12.0–15.0)
POTASSIUM: 2.8 mmol/L — AB (ref 3.5–5.1)
Sodium: 144 mmol/L (ref 135–145)
TCO2: 21 mmol/L — ABNORMAL LOW (ref 22–32)

## 2017-10-26 LAB — I-STAT TROPONIN, ED: TROPONIN I, POC: 0.48 ng/mL — AB (ref 0.00–0.08)

## 2017-10-26 LAB — CBC WITH DIFFERENTIAL/PLATELET
BASOS ABS: 0 10*3/uL (ref 0.0–0.1)
BASOS PCT: 0 %
Eosinophils Absolute: 0 10*3/uL (ref 0.0–0.7)
Eosinophils Relative: 0 %
HEMATOCRIT: 48.4 % — AB (ref 36.0–46.0)
HEMOGLOBIN: 18.1 g/dL — AB (ref 12.0–15.0)
LYMPHS ABS: 3.4 10*3/uL (ref 0.7–4.0)
Lymphocytes Relative: 11 %
MCH: 35.2 pg — AB (ref 26.0–34.0)
MCHC: 37.4 g/dL — AB (ref 30.0–36.0)
MCV: 94.2 fL (ref 78.0–100.0)
MONOS PCT: 3 %
Monocytes Absolute: 0.9 10*3/uL (ref 0.1–1.0)
NEUTROS ABS: 26.2 10*3/uL — AB (ref 1.7–7.7)
Neutrophils Relative %: 86 %
Platelets: 266 10*3/uL (ref 150–400)
RBC: 5.14 MIL/uL — ABNORMAL HIGH (ref 3.87–5.11)
RDW: 13.4 % (ref 11.5–15.5)
WBC: 30.5 10*3/uL — ABNORMAL HIGH (ref 4.0–10.5)

## 2017-10-26 LAB — C DIFFICILE QUICK SCREEN W PCR REFLEX
C Diff antigen: NEGATIVE
C Diff interpretation: NOT DETECTED
C Diff toxin: NEGATIVE

## 2017-10-26 LAB — BRAIN NATRIURETIC PEPTIDE: B Natriuretic Peptide: 311 pg/mL — ABNORMAL HIGH (ref 0.0–100.0)

## 2017-10-26 LAB — PROTIME-INR
INR: 1.09
PROTHROMBIN TIME: 14 s (ref 11.4–15.2)

## 2017-10-26 LAB — ETHANOL: Alcohol, Ethyl (B): <10 mg/dL (ref <10)

## 2017-10-26 LAB — I-STAT CG4 LACTIC ACID, ED
LACTIC ACID, VENOUS: 5.79 mmol/L — AB (ref 0.5–1.9)
LACTIC ACID, VENOUS: 3.81 mmol/L — AB (ref 0.5–1.9)

## 2017-10-26 LAB — AMMONIA: Ammonia: 17 umol/L (ref 9–35)

## 2017-10-26 LAB — CK: CK TOTAL: 9306 U/L — AB (ref 38–234)

## 2017-10-26 LAB — CBG MONITORING, ED: Glucose-Capillary: 132 mg/dL — ABNORMAL HIGH (ref 65–99)

## 2017-10-26 MED ORDER — VANCOMYCIN HCL 10 G IV SOLR
2000.0000 mg | Freq: Once | INTRAVENOUS | Status: AC
  Administered 2017-10-27: 2000 mg via INTRAVENOUS
  Filled 2017-10-26: qty 2000

## 2017-10-26 MED ORDER — LEVALBUTEROL HCL 0.63 MG/3ML IN NEBU
0.6300 mg | INHALATION_SOLUTION | Freq: Four times a day (QID) | RESPIRATORY_TRACT | Status: DC
  Administered 2017-10-26 – 2017-10-27 (×5): 0.63 mg via RESPIRATORY_TRACT
  Filled 2017-10-26 (×4): qty 3

## 2017-10-26 MED ORDER — POTASSIUM CHLORIDE CRYS ER 20 MEQ PO TBCR
40.0000 meq | EXTENDED_RELEASE_TABLET | Freq: Once | ORAL | Status: AC
Start: 2017-10-26 — End: 2017-10-26
  Administered 2017-10-26: 40 meq via ORAL
  Filled 2017-10-26: qty 2

## 2017-10-26 MED ORDER — SODIUM CHLORIDE 0.9 % IV BOLUS (SEPSIS)
1000.0000 mL | Freq: Once | INTRAVENOUS | Status: AC
  Administered 2017-10-26: 1000 mL via INTRAVENOUS

## 2017-10-26 MED ORDER — VITAMIN B-1 100 MG PO TABS
100.0000 mg | ORAL_TABLET | Freq: Every day | ORAL | Status: DC
  Administered 2017-10-26 – 2017-10-30 (×4): 100 mg via ORAL
  Filled 2017-10-26 (×5): qty 1

## 2017-10-26 MED ORDER — POTASSIUM CHLORIDE IN NACL 20-0.9 MEQ/L-% IV SOLN
INTRAVENOUS | Status: DC
  Filled 2017-10-26 (×2): qty 1000

## 2017-10-26 MED ORDER — POTASSIUM CHLORIDE CRYS ER 20 MEQ PO TBCR: 40.0000 meq | EXTENDED_RELEASE_TABLET | Freq: Once | ORAL | Status: DC

## 2017-10-26 MED ORDER — POTASSIUM CHLORIDE 10 MEQ/100ML IV SOLN
10.0000 meq | Freq: Once | INTRAVENOUS | Status: AC
  Administered 2017-10-26: 10 meq via INTRAVENOUS
  Filled 2017-10-26: qty 100

## 2017-10-26 MED ORDER — NICOTINE 14 MG/24HR TD PT24
14.0000 mg | MEDICATED_PATCH | Freq: Every day | TRANSDERMAL | Status: DC
  Administered 2017-10-27 – 2017-10-30 (×5): 14 mg via TRANSDERMAL
  Filled 2017-10-26 (×7): qty 1

## 2017-10-26 MED ORDER — RIVAROXABAN 15 MG PO TABS: 15.0000 mg | ORAL_TABLET | Freq: Every day | ORAL | Status: DC

## 2017-10-26 MED ORDER — POTASSIUM CHLORIDE 10 MEQ/100ML IV SOLN
10.0000 meq | INTRAVENOUS | Status: AC
  Administered 2017-10-26 – 2017-10-27 (×3): 10 meq via INTRAVENOUS
  Filled 2017-10-26 (×5): qty 100

## 2017-10-26 MED ORDER — LEVOFLOXACIN IN D5W 500 MG/100ML IV SOLN: 500.0000 mg | Freq: Once | INTRAVENOUS | Status: DC

## 2017-10-26 MED ORDER — SODIUM CHLORIDE 0.9 % IV SOLN
INTRAVENOUS | Status: DC
  Administered 2017-10-26: 15:00:00 via INTRAVENOUS

## 2017-10-26 MED ORDER — LORAZEPAM 2 MG/ML IJ SOLN
1.0000 mg | INTRAMUSCULAR | Status: DC | PRN
  Administered 2017-10-28 – 2017-10-31 (×9): 2 mg via INTRAVENOUS
  Filled 2017-10-26 (×9): qty 1

## 2017-10-26 MED ORDER — VANCOMYCIN HCL IN DEXTROSE 1-5 GM/200ML-% IV SOLN
1000.0000 mg | Freq: Once | INTRAVENOUS | Status: DC
  Filled 2017-10-26: qty 200

## 2017-10-26 MED ORDER — PIPERACILLIN-TAZOBACTAM 3.375 G IVPB 30 MIN
3.3750 g | Freq: Once | INTRAVENOUS | Status: AC
  Administered 2017-10-26: 3.375 g via INTRAVENOUS
  Filled 2017-10-26: qty 50

## 2017-10-26 MED ORDER — PIPERACILLIN-TAZOBACTAM IN DEX 2-0.25 GM/50ML IV SOLN
2.2500 g | Freq: Four times a day (QID) | INTRAVENOUS | Status: DC
  Administered 2017-10-27 – 2017-11-01 (×20): 2.25 g via INTRAVENOUS
  Filled 2017-10-26 (×24): qty 50

## 2017-10-26 NOTE — Consult Note (Signed)
PULMONARY / CRITICAL CARE MEDICINE   Name: Brooke Garcia MRN: [id number] DOB: 05/24/1956    ADMISSION DATE:  10/20/2017 CONSULTATION DATE:  11/02/2017  REFERRING MD:  hospitlaist  CHIEF COMPLAINT:  Patient found in hotel room on the ground semi-conscious in her own urien and feces. Family had not heard from her in a few days  HISTORY OF PRESENT ILLNESS:   The patient has been staying by herself ion a motel room. Her family had not heard from her in a few days.  The police were called and the patient was found on the ground semi-conscious in urine and feces. The patient said she vomited during the night as well. We are not completley sure how she had been in the past few days. The patient is encephalopathic and cannot give a very good history presently. She will answer most simple questions.  According to the family the patient is a daily drinker. She does smoke  And has been a heavy smoker for over 40 years. There is no known history of kidney problems in thepast. According to her family the patient had a CT scan of the chest at another facility about 2 weeks ago showing a thoracic aneurysym which was fairly large. She is not a diabetic. We do not have a complete list of medications and the family is unaware of most of her med. 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 The patient was found to have an elevated lactate >5, potassium of 2.8 and creatinine of 4.9 on presentation He r02 saturation was normal earlier. She does appear short of breath and tremulous.  PAST MEDICAL HISTORY :  She  has a past medical history of Acute venous embolism and thrombosis of unspecified deep vessels of lower extremity, Anxiety state, unspecified, Arthritis, Blood dyscrasia, Calculus of kidney, Colitis, Complete rupture of rotator cuff, COPD (chronic obstructive pulmonary disease) ([place]), Degeneration of intervertebral disc, site unspecified, Depression, DVT (deep venous thrombosis) ([place]), Family  history of ischemic heart disease, History of gallstones, HLD (hyperlipidemia), HTN (hypertension), Other and unspecified noninfectious gastroenteritis and colitis(558.9), Painful respiration, Primary hypercoagulable state ([place]), Protein S deficiency ([place]), Renal failure, Tobacco use disorder, Unspecified urinary incontinence, and Venous stasis ulcers ([place]).  PAST SURGICAL HISTORY: She  has a past surgical history that includes Thrombectomy / embolectomy femoral artery; Nasal sinus surgery; Tubal ligation; Rotator cuff repair; tubal ligation reversal; Colonoscopy (04/27/2012); Endovenous ablation saphenous vein w/ laser (Left, 07-04-2014); and Endovenous ablation saphenous vein w/ laser (Right, 07-17-2014).  Allergies  Allergen Reactions  . Butalbital-Apap-Caffeine Hives  . Naproxen Hives  . Tylenol [Acetaminophen] Other (See Comments)    Upset stomach    No current facility-administered medications on file prior to encounter.    Current Outpatient Medications on File Prior to Encounter  Medication Sig  . ALPRAZolam (XANAX) 1 MG tablet Take 1 tablet (1 mg total) by mouth 2 (two) times daily as needed for anxiety.  Marland Kitchen arformoterol (BROVANA) 15 MCG/2ML NEBU Take 2 mLs (15 mcg total) by nebulization 2 (two) times daily.  . chlorthalidone (HYGROTON) 25 MG tablet TAKE 1 TABLET(25 MG) BY MOUTH DAILY  . Glycopyrrolate (LONHALA MAGNAIR REFILL KIT) 25 MCG/ML SOLN Inhale 1 Act into the lungs 2 (two) times daily.  . irbesartan (AVAPRO) 300 MG tablet TAKE 1 TABLET(300 MG) BY MOUTH DAILY  . LONHALA MAGNAIR STARTER KIT 25 MCG/ML SOLN Use one vial via Magnair device twice a day. Generic: Glycopyrrolate  . oxyCODONE (ROXICODONE) 15 MG immediate release tablet Take  1 tablet (15 mg total) by mouth every 4 (four) hours as needed for pain.  . pantoprazole (PROTONIX) 40 MG tablet TAKE 1 TABLET(40 MG) BY MOUTH DAILY  . Rivaroxaban (XARELTO) 15 MG TABS tablet TAKE 1 TABLET(15 MG) BY MOUTH DAILY WITH SUPPER  .  thiamine (VITAMIN B-1) 100 MG tablet Take 1 tablet (100 mg total) by mouth daily.    FAMILY HISTORY:  Her indicated that her mother is deceased. She indicated that her father is deceased. She indicated that only one of her two sisters is alive. She indicated that only one of her three brothers is alive. She indicated that the status of her maternal grandmother is unknown. She indicated that the status of her maternal grandfather is unknown. She indicated that the status of her paternal grandmother is unknown. She indicated that the status of her neg hx is unknown.   SOCIAL HISTORY: She  reports that she has been smoking cigarettes.  She has a 15.00 pack-year smoking history. she has never used smokeless tobacco. She reports that she does not drink alcohol or use drugs.  REVIEW OF SYSTEMS:   Has pain in her lower back Had vomiting overnigght. Had diarrhea of unclear duration but her anus and surrounding area  Hurts Review of Systems  Constitutional: Positive for malaise/fatigue.  Eyes: Negative for double vision and pain.  Respiratory: Positive for shortness of breath.   Cardiovascular: Negative for chest pain, palpitations and leg swelling.  Gastrointestinal: Positive for diarrhea, nausea and vomiting. Negative for abdominal pain.  Genitourinary: Negative for flank pain.  Musculoskeletal: Positive for back pain.  Skin: Negative for rash.  Neurological: Positive for tremors and weakness. Negative for seizures and headaches.  Endo/Heme/Allergies: Negative for polydipsia.  Psychiatric/Behavioral: The patient is nervous/anxious.     SUBJECTIVE:  The patient feels weak and is shakey. She is not clear on what haoppened to her recently. She does feel breathless  VITAL SIGNS: BP (!) 151/119   Pulse 63   Temp 98.8 F (37.1 C) (Rectal)   Resp (!) 25   SpO2 (!) 86%          INTAKE / OUTPUT: No intake/output data recorded.  PHYSICAL EXAMINATION: General:  Woman looks acutely and  chronically ill. Neuro:  Awake  And answers simple questions. She si tremulous. She is moving all her extremities. When she speaks her speech is fairly fluent. She is oriented to perso but does not appear oriented to place and time HEENT:  Unremarkable, no sign of major trauma, PERRLA, EOMI Cardiovascular:  RRR, S1S2 Lungs:  bilateral Bs, mild expiratory wheeze Abdomen:  Soft, BS+,non-tender, no gorss organomegaly Musculoskeletal:  Moves her extremities, has some lower back pain Skin:  Redness in the perirectal area Uro.GYN: Patient apparently has a history of both bladder and uterine prolapse but it is not obvious at this time  LABS:  BMET Recent Labs  Lab 10/20/2017 1429 11/07/2017 1451  NA 144 144  K 2.9* 2.8*  CL 100* 103  CO2 18*  --   BUN 58* 51*  CREATININE 5.10* 4.90*  GLUCOSE 138* 126*    Electrolytes Recent Labs  Lab 10/27/2017 1429  CALCIUM 9.8    CBC Recent Labs  Lab 11/06/2017 1429 10/16/2017 1451  WBC 30.5*  --   HGB 18.1* 18.0*  HCT 48.4* 53.0*  PLT 266  --     Coag's Recent Labs  Lab 10/21/2017 1429  INR 1.09    Sepsis Markers Recent Labs  Lab 11/03/2017 1451  11/06/2017 1736  LATICACIDVEN 5.79* 3.81*    ABG Recent Labs  Lab 10/22/2017 1430  PHART 7.476*  PCO2ART below reportable range  PO2ART 75.8*    Liver Enzymes Recent Labs  Lab 10/20/2017 1429  AST 245*  ALT 97*  ALKPHOS 97  BILITOT 1.6*  ALBUMIN 4.1    Cardiac Enzymes No results for input(s): TROPONINI, PROBNP in the last 168 hours.  Glucose Recent Labs  Lab 10/25/2017 1435  GLUCAP 132*    Imaging Dg Chest 2 View  Result Date: 10/14/2017 CLINICAL DATA:  Fall. EXAM: CHEST  2 VIEW COMPARISON:  CT chest dated October 05, 2017. Chest x-ray dated May 16, 2015. FINDINGS: Stable cardiomediastinal silhouette. Normal pulmonary vascularity. Atherosclerotic calcification of the aortic arch. No focal consolidation, pleural effusion, or pneumothorax. No acute osseous abnormality.  IMPRESSION: No active cardiopulmonary disease. Electronically Signed   By: Titus Dubin M.D.   On: 10/28/2017 16:25   Dg Pelvis 1-2 Views  Result Date: 10/21/2017 CLINICAL DATA:  Recent fall EXAM: PELVIS - 1-2 VIEW COMPARISON:  None. FINDINGS: Pelvic ring is intact. No acute fracture or dislocation is seen. Degenerative changes of lumbar spine are noted. No soft tissue abnormality is noted. IMPRESSION: No acute abnormality seen. Electronically Signed   By: Inez Catalina M.D.   On: 10/20/2017 16:24   Ct Head Wo Contrast  Result Date: 10/22/2017 CLINICAL DATA:  Altered mental status and recent fall EXAM: CT HEAD WITHOUT CONTRAST CT CERVICAL SPINE WITHOUT CONTRAST TECHNIQUE: Multidetector CT imaging of the head and cervical spine was performed following the standard protocol without intravenous contrast. Multiplanar CT image reconstructions of the cervical spine were also generated. COMPARISON:  None. FINDINGS: CT HEAD FINDINGS Brain: No evidence of acute infarction, hemorrhage, hydrocephalus, extra-axial collection or mass lesion/mass effect. Vascular: No hyperdense vessel or unexpected calcification. Skull: Normal. Negative for fracture or focal lesion. Sinuses/Orbits: No acute finding. Other: None. CT CERVICAL SPINE FINDINGS Alignment: Within normal limits. Skull base and vertebrae: 7 cervical segments are well visualized. Vertebral body height is well maintained. Facet hypertrophic changes are noted throughout the cervical spine. No acute fracture or acute facet abnormality is noted. Osteophytic changes are noted most marked at C5-6 and C6-7. Soft tissues and spinal canal: Vascular calcifications are noted. No acute soft tissue abnormality is noted. Upper chest: Visualized upper chest is unremarkable. Other: None IMPRESSION: CT of the head: No acute intracranial abnormality noted. CT of the cervical spine: Multilevel degenerative change without acute abnormality. Electronically Signed   By: Inez Catalina  M.D.   On: 10/16/2017 16:21   Ct Cervical Spine Wo Contrast  Result Date: 11/06/2017 CLINICAL DATA:  Altered mental status and recent fall EXAM: CT HEAD WITHOUT CONTRAST CT CERVICAL SPINE WITHOUT CONTRAST TECHNIQUE: Multidetector CT imaging of the head and cervical spine was performed following the standard protocol without intravenous contrast. Multiplanar CT image reconstructions of the cervical spine were also generated. COMPARISON:  None. FINDINGS: CT HEAD FINDINGS Brain: No evidence of acute infarction, hemorrhage, hydrocephalus, extra-axial collection or mass lesion/mass effect. Vascular: No hyperdense vessel or unexpected calcification. Skull: Normal. Negative for fracture or focal lesion. Sinuses/Orbits: No acute finding. Other: None. CT CERVICAL SPINE FINDINGS Alignment: Within normal limits. Skull base and vertebrae: 7 cervical segments are well visualized. Vertebral body height is well maintained. Facet hypertrophic changes are noted throughout the cervical spine. No acute fracture or acute facet abnormality is noted. Osteophytic changes are noted most marked at C5-6 and C6-7. Soft tissues and spinal canal: Vascular  calcifications are noted. No acute soft tissue abnormality is noted. Upper chest: Visualized upper chest is unremarkable. Other: None IMPRESSION: CT of the head: No acute intracranial abnormality noted. CT of the cervical spine: Multilevel degenerative change without acute abnormality. Electronically Signed   By: Inez Catalina M.D.   On: 10/25/2017 16:21      ANTIBIOTICS: Patient given 1 dose of vanco and startedd on Zosyn as well.     LINES/TUBES:  peripheral Ivs.  Patient too get a rectal tube.       ASSESSMENT / PLAN: Encephalopathy The patient was found semi-conscious. She is tremulous and disoriented. Her WBC is elevated. The patient hasd been fully cultured. Her CXR does not show an acute infiltrate. Her BUN is >50 and Creat close to 5. There may be an element of  uremia. The patient ios a daily drinker according to the family and may be experiencing some Etoh withdrawal. Has not had seizures or overt alcohol withdrawal in the past.  AKI The patient has AKI with creatinine of 4.9 and BUN of 51. We are going to be checking her CPK to r/o rhabdomyolysis. She is getting cautioulslyy hydrated presently and had 2-3 liters already in the ED. I have request a nephrology consult. I don't believe there is an emergent need for RRT  at this time.  SIRS The patient has an elevated WBC. She may have a UTI but urinanalysis is not done yet. No clear signof pneumonia. No obvious skin breakdown. The patient  has diarrhea. Gastrointestinal panel and stool c. Diff requested  Hypokalemia Potassium is being replaced  Alcohol withdrawal? Patient placed on CIWA prortocol         Micheal Likens MD Pulmonary and [place] Pager: [phone]  11/10/2017, 6:25 PM

## 2017-10-26 NOTE — ED Provider Notes (Signed)
[facility] DEPT Provider Note   CSN: [id number] Arrival date & time: 10/25/2017  1347     History   Chief Complaint Chief Complaint  Patient presents with  . Fall    HPI ARIYONNA Garcia is a 62 y.o. female.  Pt presents to the ED today with altered mental status.  Pt lives alone and her family has not been able to get ahold of her for 4 days.  They called the police dept for a well check.  They found the patient on the floor.  She was awake, but has no idea how long she's been there.  She does not know what caused her to fall.  The pt denies any pain, but looks like she hurts when she's moved.  She is covered in stool and in urine.  It looks like she's been vomiting.  She is cold to the touch although EMS said the room was warm.  Pt is on Xarelto for DVTs.      Past Medical History:  Diagnosis Date  . Acute venous embolism and thrombosis of unspecified deep vessels of lower extremity   . Anxiety state, unspecified   . Arthritis   . Blood dyscrasia    protein def.  . Calculus of kidney   . Colitis   . Complete rupture of rotator cuff   . COPD (chronic obstructive pulmonary disease) ([place])   . Degeneration of intervertebral disc, site unspecified   . Depression   . DVT (deep venous thrombosis) ([place])   . Family history of ischemic heart disease   . History of gallstones   . HLD (hyperlipidemia)   . HTN (hypertension)   . Other and unspecified noninfectious gastroenteritis and colitis(558.9)   . Painful respiration   . Primary hypercoagulable state ([place])   . Protein S deficiency ([place])   . Renal failure   . Tobacco use disorder   . Unspecified urinary incontinence   . Venous stasis ulcers ([facility])    left leg    Patient Active Problem List   Diagnosis Date Noted  . Pulmonary hypertension ([place]) 10/09/2017  . Aortic atherosclerosis ([place]) 10/05/2017  . Thiamine deficiency 09/12/2017  . Chronic renal disease, stage 3, moderately decreased  glomerular filtration rate (GFR) between 30-59 mL/min/1.73 square meter ([facility]) 09/08/2017  . Deficiency anemia 09/08/2017  . Thoracic aortic aneurysm without rupture ([place]) 09/08/2017  . Other microscopic hematuria 06/10/2017  . Encounter for long-term opiate analgesic use 06/09/2017  . Bladder prolapse, female, acquired 10/25/2016  . Chronic hepatitis C without hepatic coma ([place]) 07/21/2016  . Hyperglycemia 07/15/2016  . Gastroesophageal reflux disease without esophagitis 03/18/2016  . Screening for cervical cancer 11/27/2015  . Pulmonary emboli ([place]) 05/08/2015  . Hyperlipidemia with target LDL less than 130 01/09/2015  . Varicose veins of lower extremities with complications 13/24/4010  . Leg ulcer ([place]) 06/11/2014  . Venous stasis ulcer ([place]) 11/30/2013  . Polyp of colon 04/27/2012  . Ischemic colitis ([place]) 03/28/2012  . Cervical spondylitis with radiculitis ([place]) 10/18/2011  . Anticoagulant long-term use 06/17/2011  . Visit for screening mammogram 05/11/2011  . COPD, moderate ([place]) 03/25/2011  . TOBACCO ABUSE 12/29/2007  . DVT of lower extremity, bilateral ([place]) 12/29/2007  . HYPERTENSION, BENIGN ESSENTIAL 06/26/2007  . Depression with anxiety 05/30/2007  . HYPERCOAGULABLE STATE, PRIMARY 01/18/2007    Past Surgical History:  Procedure Laterality Date  . COLONOSCOPY  04/27/2012   Procedure: COLONOSCOPY;  Surgeon: Jerene Bears, MD;  Location: [facility];  Service: Gastroenterology;  Laterality: N/A;  . ENDOVENOUS ABLATION SAPHENOUS VEIN W/ LASER Left 07-04-2014   EVLA left greater saphenous vein by Curt Jews MD  . ENDOVENOUS ABLATION SAPHENOUS VEIN W/ LASER Right 07-17-2014   EVLA roght greater saphenous vein by Curt Jews MD  . NASAL SINUS SURGERY    . ROTATOR CUFF REPAIR     right  . THROMBECTOMY / EMBOLECTOMY FEMORAL ARTERY     DVT  . TUBAL LIGATION    . tubal ligation reversal      OB History    No data available       Home Medications    Prior to Admission  medications   Medication Sig Start Date End Date Taking? Authorizing Provider  ALPRAZolam Duanne Moron) 1 MG tablet Take 1 tablet (1 mg total) by mouth 2 (two) times daily as needed for anxiety. 06/09/17   Janith Lima, MD  arformoterol (BROVANA) 15 MCG/2ML NEBU Take 2 mLs (15 mcg total) by nebulization 2 (two) times daily. 09/08/17   Janith Lima, MD  chlorthalidone (HYGROTON) 25 MG tablet TAKE 1 TABLET(25 MG) BY MOUTH DAILY 07/20/17   Janith Lima, MD  Glycopyrrolate [facility] REFILL KIT) 25 MCG/ML SOLN Inhale 1 Act into the lungs 2 (two) times daily. 09/08/17   Janith Lima, MD  irbesartan (AVAPRO) 300 MG tablet TAKE 1 TABLET(300 MG) BY MOUTH DAILY 09/08/17   Janith Lima, MD  [facility] MAGNAIR STARTER KIT 25 MCG/ML SOLN Use one vial via Magnair device twice a day. Generic: Glycopyrrolate 09/22/17   Janith Lima, MD  oxyCODONE (ROXICODONE) 15 MG immediate release tablet Take 1 tablet (15 mg total) by mouth every 4 (four) hours as needed for pain. 10/06/17   Janith Lima, MD  pantoprazole (PROTONIX) 40 MG tablet TAKE 1 TABLET(40 MG) BY MOUTH DAILY 06/09/17   Janith Lima, MD  Rivaroxaban (XARELTO) 15 MG TABS tablet TAKE 1 TABLET(15 MG) BY MOUTH DAILY WITH SUPPER 06/09/17   Janith Lima, MD  thiamine (VITAMIN B-1) 100 MG tablet Take 1 tablet (100 mg total) by mouth daily. 09/12/17   Janith Lima, MD    Family History Family History  Problem Relation Age of Onset  . Breast cancer Mother        mets to liver  . Stroke Mother   . Heart attack Father   . Lung cancer Father   . Other Father        Protein S Deficiency  . Clotting disorder Father   . Diabetes Father   . Heart disease Father   . Cancer Father        Lung  . Hypertension Maternal Grandmother   . Colon cancer Maternal Grandmother   . Heart disease Maternal Grandmother   . Heart attack Paternal Grandmother   . Tuberculosis Paternal Grandmother   . Diabetes Sister   . Alzheimer's disease Maternal  Grandfather   . Hypertension Brother   . Ulcers Brother   . Stomach cancer Neg Hx     Social History Social History   Tobacco Use  . Smoking status: Current Every Day Smoker    Packs/day: 0.50    Years: 30.00    Pack years: 15.00    Types: Cigarettes  . Smokeless tobacco: Never Used  . Tobacco comment: 0.5 packs per day  Substance Use Topics  . Alcohol use: No  . Drug use: No     Allergies   Butalbital-apap-caffeine; Naproxen; and Tylenol [  acetaminophen]   Review of Systems Review of Systems  Neurological: Positive for weakness.  All other systems reviewed and are negative.    Physical Exam Updated Vital Signs BP (!) 123/49   Pulse 86   Temp 98.8 F (37.1 C) (Rectal)   Resp (!) 37   SpO2 100%   Physical Exam  Constitutional: She appears distressed.  HENT:  Head: Normocephalic and atraumatic.  Right Ear: External ear normal.  Left Ear: External ear normal.  Nose: Nose normal.  Mouth/Throat: Mucous membranes are dry.  Eyes: Conjunctivae and EOM are normal. Pupils are equal, round, and reactive to light.  Neck: Normal range of motion. Neck supple.  Cardiovascular: Regular rhythm, normal heart sounds and intact distal pulses. Tachycardia present.  Pulmonary/Chest: Effort normal and breath sounds normal.  Abdominal: Soft. Bowel sounds are normal.  Musculoskeletal: Normal range of motion.  Neurological: She is alert.  Pt knows her name  Skin: Capillary refill takes less than 2 seconds.  Skin is cool and mottled. Skin breakdown right thigh.     Nursing note and vitals reviewed.    ED Treatments / Results  Labs (all labs ordered are listed, but only abnormal results are displayed) Labs Reviewed  BLOOD GAS, ARTERIAL - Abnormal; Notable for the following components:      Result Value   pH, Arterial 7.476 (*)    pO2, Arterial 75.8 (*)    All other components within normal limits  I-STAT CG4 LACTIC ACID, ED - Abnormal; Notable for the following  components:   Lactic Acid, Venous 5.79 (*)    All other components within normal limits  I-STAT CHEM 8, ED - Abnormal; Notable for the following components:   Potassium 2.8 (*)    BUN 51 (*)    Creatinine, Ser 4.90 (*)    Glucose, Bld 126 (*)    Calcium, Ion 1.02 (*)    TCO2 21 (*)    Hemoglobin 18.0 (*)    HCT 53.0 (*)    All other components within normal limits  URINE CULTURE  CULTURE, BLOOD (ROUTINE X 2)  CULTURE, BLOOD (ROUTINE X 2)  AMMONIA  ETHANOL  PROTIME-INR  COMPREHENSIVE METABOLIC PANEL  CBC WITH DIFFERENTIAL/PLATELET  RAPID URINE DRUG SCREEN, HOSP PERFORMED  URINALYSIS, ROUTINE W REFLEX MICROSCOPIC  CK  CBG MONITORING, ED    EKG  EKG Interpretation  Date/Time:  Wednesday October 26 2017 14:49:30 EST Ventricular Rate:  86 PR Interval:    QRS Duration: 81 QT Interval:  429 QTC Calculation: 514 R Axis:   76 Text Interpretation:  Sinus rhythm Right atrial enlargement Minimal ST depression, diffuse leads Prolonged QT interval Confirmed by Isla Pence [phone]) on 11/02/2017 3:01:25 PM       Radiology No results found.  Procedures Procedures (including critical care time)  Medications Ordered in ED Medications  sodium chloride 0.9 % bolus 1,000 mL (1,000 mLs Intravenous New Bag/Given 10/20/2017 1446)    And  0.9 %  sodium chloride infusion ( Intravenous New Bag/Given 11/09/2017 1447)  sodium chloride 0.9 % bolus 1,000 mL (not administered)     Initial Impression / Assessment and Plan / ED Course  I have reviewed the triage vital signs and the nursing notes.  Pertinent labs & imaging results that were available during my care of the patient were reviewed by me and considered in my medical decision making (see chart for details).    CRITICAL CARE Performed by: Isla Pence   Total critical care time: 30 minutes  Critical care time was exclusive of separately billable procedures and treating other patients.  Critical care was necessary to treat  or prevent imminent or life-threatening deterioration.  Critical care was time spent personally by me on the following activities: development of treatment plan with patient and/or surrogate as well as nursing, discussions with consultants, evaluation of patient's response to treatment, examination of patient, obtaining history from patient or surrogate, ordering and performing treatments and interventions, ordering and review of laboratory studies, ordering and review of radiographic studies, pulse oximetry and re-evaluation of patient's condition.  I strongly suspect pt has rhabdomyolysis clinically. IVFs started.  Labs and xrays pending.  Pt to be signed out to Dr. Roderic Palau pending results.   Final Clinical Impressions(s) / ED Diagnoses   Final diagnoses:  Acute renal failure, unspecified acute renal failure type ([place])  Fall, initial encounter  Hypokalemia  Lactic acidosis    ED Discharge Orders    None       Isla Pence, MD 10/24/2017 1541

## 2017-10-26 NOTE — ED Notes (Signed)
Dr. Tamala Julian hospitalist paged and made aware of situation regarding foley catheter. New orders given for rectal tube d/t diarrhea. Dr. Tamala Julian suggested paging mid level for foley catheter insertion.

## 2017-10-26 NOTE — ED Provider Notes (Signed)
Patient's care was transferred to me by Dr. Gilford Raid.  Labs show patient is septic and in rhabdo.  I spoke with critical care and they will consult on the patient and have medicine admit to stepdown.   patient is getting significant IV hydration and antibiotics.   CRITICAL CARE Performed by: Milton Ferguson Total critical care time:35 minutes Critical care time was exclusive of separately billable procedures and treating other patients. Critical care was necessary to treat or prevent imminent or life-threatening deterioration. Critical care was time spent personally by me on the following activities: development of treatment plan with patient and/or surrogate as well as nursing, discussions with consultants, evaluation of patient's response to treatment, examination of patient, obtaining history from patient or surrogate, ordering and performing treatments and interventions, ordering and review of laboratory studies, ordering and review of radiographic studies, pulse oximetry and re-evaluation of patient's condition. ]   Milton Ferguson, MD 10/16/2017 1706

## 2017-10-26 NOTE — ED Notes (Signed)
This RN cleaned up patient and changed full bed. Patient given warm blankets and TV remote.

## 2017-10-26 NOTE — H&P (Signed)
History and Physical    Brooke Garcia OAC:[id number] DOB: 07-02-1956 DOA: 11/02/2017  PCP: Janith Lima, MD   Patient coming from: Home    Chief Complaint: Altered mental status, diarrhea  HPI: Brooke Garcia is a 62 y.o. female with medical history significant of hypertension, COPD, CKD, DVT, hepatitis C, thoracic abdominal aneurysm, hypercoagulable state who was brought to the emergency department after she was found on the floor. Patient is currently in altered mental status and she is unable to give history so all the history was taken from emergency department course and her sister. As per the ED info and sister,family  reported was that she was out of contact and called the police.  She was found in a hotel room but police had to break the door to get inside.  She was found on the floor, altered and was covered in stool and urine. Her sister told her that she usually lives with her ex-husband.  She had difficult time because her ex-husband was also living with another woman so she decided to get a break and went to go to a hotel where she was staying for a week.  Normally she is alert and oriented, quite independent and is able to take care of herself.  She smokes. When she was found in the hotel room, she was found to have diarrhea which continued while in the emergency department.  Patient was also found to be hypoxic on presentation.  She was found to be severely hypokalemic.  Lactic acid was elevated .  She also has severe acute kidney injury. She has severe leukocytosis on presentation.  She has elevated CK level. Patient seen and examined the bedside in the emergency department.  Currently her mental status is improving.  She is still not fully oriented.  She says she was having diarrhea.  Patient denies any abdominal pain.  Her vitals are currently stable.  ED Course: Patient was given broad-spectrum antibiotics.  She was given 3 L of normal sign bolus.  Review of Systems: As per  HPI otherwise 10 point review of systems negative.    Past Medical History:  Diagnosis Date  . Acute venous embolism and thrombosis of unspecified deep vessels of lower extremity   . Anxiety state, unspecified   . Arthritis   . Blood dyscrasia    protein def.  . Calculus of kidney   . Colitis   . Complete rupture of rotator cuff   . COPD (chronic obstructive pulmonary disease) ([place])   . Degeneration of intervertebral disc, site unspecified   . Depression   . DVT (deep venous thrombosis) ([place])   . Family history of ischemic heart disease   . History of gallstones   . HLD (hyperlipidemia)   . HTN (hypertension)   . Other and unspecified noninfectious gastroenteritis and colitis(558.9)   . Painful respiration   . Primary hypercoagulable state ([place])   . Protein S deficiency ([place])   . Renal failure   . Tobacco use disorder   . Unspecified urinary incontinence   . Venous stasis ulcers (HCC)    left leg    Past Surgical History:  Procedure Laterality Date  . COLONOSCOPY  04/27/2012   Procedure: COLONOSCOPY;  Surgeon: Jerene Bears, MD;  Location: [facility];  Service: Gastroenterology;  Laterality: N/A;  . ENDOVENOUS ABLATION SAPHENOUS VEIN W/ LASER Left 07-04-2014   EVLA left greater saphenous vein by Curt Jews MD  . ENDOVENOUS ABLATION SAPHENOUS VEIN W/ LASER Right  07-17-2014   EVLA roght greater saphenous vein by Curt Jews MD  . NASAL SINUS SURGERY    . ROTATOR CUFF REPAIR     right  . THROMBECTOMY / EMBOLECTOMY FEMORAL ARTERY     DVT  . TUBAL LIGATION    . tubal ligation reversal       reports that she has been smoking cigarettes.  She has a 15.00 pack-year smoking history. she has never used smokeless tobacco. She reports that she does not drink alcohol or use drugs.  Allergies  Allergen Reactions  . Butalbital-Apap-Caffeine Hives  . Naproxen Hives  . Tylenol [Acetaminophen] Other (See Comments)    Upset stomach    Family History  Problem Relation Age of  Onset  . Breast cancer Mother        mets to liver  . Stroke Mother   . Heart attack Father   . Lung cancer Father   . Other Father        Protein S Deficiency  . Clotting disorder Father   . Diabetes Father   . Heart disease Father   . Cancer Father        Lung  . Hypertension Maternal Grandmother   . Colon cancer Maternal Grandmother   . Heart disease Maternal Grandmother   . Heart attack Paternal Grandmother   . Tuberculosis Paternal Grandmother   . Diabetes Sister   . Alzheimer's disease Maternal Grandfather   . Hypertension Brother   . Ulcers Brother   . Stomach cancer Neg Hx      Prior to Admission medications   Medication Sig Start Date End Date Taking? Authorizing Provider  ALPRAZolam Duanne Moron) 1 MG tablet Take 1 tablet (1 mg total) by mouth 2 (two) times daily as needed for anxiety. 06/09/17   Janith Lima, MD  arformoterol (BROVANA) 15 MCG/2ML NEBU Take 2 mLs (15 mcg total) by nebulization 2 (two) times daily. 09/08/17   Janith Lima, MD  chlorthalidone (HYGROTON) 25 MG tablet TAKE 1 TABLET(25 MG) BY MOUTH DAILY 07/20/17   Janith Lima, MD  Glycopyrrolate [facility] REFILL KIT) 25 MCG/ML SOLN Inhale 1 Act into the lungs 2 (two) times daily. 09/08/17   Janith Lima, MD  irbesartan (AVAPRO) 300 MG tablet TAKE 1 TABLET(300 MG) BY MOUTH DAILY 09/08/17   Janith Lima, MD  [facility] MAGNAIR STARTER KIT 25 MCG/ML SOLN Use one vial via Magnair device twice a day. Generic: Glycopyrrolate 09/22/17   Janith Lima, MD  oxyCODONE (ROXICODONE) 15 MG immediate release tablet Take 1 tablet (15 mg total) by mouth every 4 (four) hours as needed for pain. 10/06/17   Janith Lima, MD  pantoprazole (PROTONIX) 40 MG tablet TAKE 1 TABLET(40 MG) BY MOUTH DAILY 06/09/17   Janith Lima, MD  Rivaroxaban (XARELTO) 15 MG TABS tablet TAKE 1 TABLET(15 MG) BY MOUTH DAILY WITH SUPPER 06/09/17   Janith Lima, MD  thiamine (VITAMIN B-1) 100 MG tablet Take 1 tablet (100 mg total) by  mouth daily. 09/12/17   Janith Lima, MD    Physical Exam: Vitals:   11/03/2017 1447 11/08/2017 1512 10/18/2017 1530 11/05/2017 1631  BP:  106/66 (!) 123/49 (!) 144/85  Pulse:   86 63  Resp:  (!) 32 (!) 37 (!) 31  Temp: 98.8 F (37.1 C)     TempSrc: Rectal     SpO2:  100% 100% (!) 86%    Constitutional: NAD, calm, comfortable Vitals:   11/02/2017 1447  10/19/2017 1512 10/17/2017 1530 10/28/2017 1631  BP:  106/66 (!) 123/49 (!) 144/85  Pulse:   86 63  Resp:  (!) 32 (!) 37 (!) 31  Temp: 98.8 F (37.1 C)     TempSrc: Rectal     SpO2:  100% 100% (!) 86%   General: Disheveled,poor hygiene,obese, in no acute distress Eyes: PERRL, lids and conjunctivae normal ENMT: Mucous membranes are very dry Neck: normal, supple, no masses, no thyromegaly Respiratory: clear to auscultation bilaterally, no wheezing, no crackles. Normal respiratory effort. No accessory muscle use.  Cardiovascular: Regular rate and rhythm, no murmurs / rubs / gallops. No extremity edema. 2+ pedal pulses. No carotid bruits.  Abdomen: no tenderness, no masses palpated. No hepatosplenomegaly. Bowel sounds positive.  Musculoskeletal: no clubbing / cyanosis. No joint deformity upper and lower extremities. Good ROM, no contractures. Normal muscle tone.  Skin: Mottled on the bilateral upper and lower extremities ,no rashes, lesions, skin breakdown on the right  neurologic: CN 2-12 grossly intact. Sensation intact, DTR normal. Strength 5/5 in all 4.  Psychiatric: Alert, oriented to place  Foley Catheter:None  Labs on Admission: I have personally reviewed following labs and imaging studies  CBC: Recent Labs  Lab 11/10/2017 1429 11/05/2017 1451  WBC 30.5*  --   NEUTROABS 26.2*  --   HGB 18.1* 18.0*  HCT 48.4* 53.0*  MCV 94.2  --   PLT 266  --    Basic Metabolic Panel: Recent Labs  Lab 11/05/2017 1429 10/25/2017 1451  NA 144 144  K 2.9* 2.8*  CL 100* 103  CO2 18*  --   GLUCOSE 138* 126*  BUN 58* 51*  CREATININE 5.10* 4.90*    CALCIUM 9.8  --    GFR: CrCl cannot be calculated (Unknown ideal weight.). Liver Function Tests: Recent Labs  Lab 11/04/2017 1429  AST 245*  ALT 97*  ALKPHOS 97  BILITOT 1.6*  PROT 9.4*  ALBUMIN 4.1   No results for input(s): LIPASE, AMYLASE in the last 168 hours. Recent Labs  Lab 10/15/2017 1429  AMMONIA 17   Coagulation Profile: Recent Labs  Lab 10/25/2017 1429  INR 1.09   Cardiac Enzymes: Recent Labs  Lab 10/29/2017 1429  CKTOTAL 9,306*   BNP (last 3 results) No results for input(s): PROBNP in the last 8760 hours. HbA1C: No results for input(s): HGBA1C in the last 72 hours. CBG: Recent Labs  Lab 11/09/2017 1435  GLUCAP 132*   Lipid Profile: No results for input(s): CHOL, HDL, LDLCALC, TRIG, CHOLHDL, LDLDIRECT in the last 72 hours. Thyroid Function Tests: No results for input(s): TSH, T4TOTAL, FREET4, T3FREE, THYROIDAB in the last 72 hours. Anemia Panel: No results for input(s): VITAMINB12, FOLATE, FERRITIN, TIBC, IRON, RETICCTPCT in the last 72 hours. Urine analysis:    Component Value Date/Time   COLORURINE YELLOW 09/08/2017 1346   APPEARANCEUR CLEAR 09/08/2017 1346   LABSPEC <=1.005 (A) 09/08/2017 1346   PHURINE 5.5 09/08/2017 1346   GLUCOSEU NEGATIVE 09/08/2017 1346   HGBUR NEGATIVE 09/08/2017 1346   HGBUR trace-lysed 06/26/2007 1523   BILIRUBINUR NEGATIVE 09/08/2017 1346   KETONESUR NEGATIVE 09/08/2017 1346   PROTEINUR >300 (A) 03/28/2012 1230   UROBILINOGEN 0.2 09/08/2017 1346   NITRITE NEGATIVE 09/08/2017 1346   LEUKOCYTESUR NEGATIVE 09/08/2017 1346    Radiological Exams on Admission: Dg Chest 2 View  Result Date: 10/31/2017 CLINICAL DATA:  Fall. EXAM: CHEST  2 VIEW COMPARISON:  CT chest dated October 05, 2017. Chest x-ray dated May 16, 2015. FINDINGS: Stable cardiomediastinal  silhouette. Normal pulmonary vascularity. Atherosclerotic calcification of the aortic arch. No focal consolidation, pleural effusion, or pneumothorax. No acute osseous  abnormality. IMPRESSION: No active cardiopulmonary disease. Electronically Signed   By: Titus Dubin M.D.   On: 10/21/2017 16:25   Dg Pelvis 1-2 Views  Result Date: 11/06/2017 CLINICAL DATA:  Recent fall EXAM: PELVIS - 1-2 VIEW COMPARISON:  None. FINDINGS: Pelvic ring is intact. No acute fracture or dislocation is seen. Degenerative changes of lumbar spine are noted. No soft tissue abnormality is noted. IMPRESSION: No acute abnormality seen. Electronically Signed   By: Inez Catalina M.D.   On: 11/09/2017 16:24   Ct Head Wo Contrast  Result Date: 11/07/2017 CLINICAL DATA:  Altered mental status and recent fall EXAM: CT HEAD WITHOUT CONTRAST CT CERVICAL SPINE WITHOUT CONTRAST TECHNIQUE: Multidetector CT imaging of the head and cervical spine was performed following the standard protocol without intravenous contrast. Multiplanar CT image reconstructions of the cervical spine were also generated. COMPARISON:  None. FINDINGS: CT HEAD FINDINGS Brain: No evidence of acute infarction, hemorrhage, hydrocephalus, extra-axial collection or mass lesion/mass effect. Vascular: No hyperdense vessel or unexpected calcification. Skull: Normal. Negative for fracture or focal lesion. Sinuses/Orbits: No acute finding. Other: None. CT CERVICAL SPINE FINDINGS Alignment: Within normal limits. Skull base and vertebrae: 7 cervical segments are well visualized. Vertebral body height is well maintained. Facet hypertrophic changes are noted throughout the cervical spine. No acute fracture or acute facet abnormality is noted. Osteophytic changes are noted most marked at C5-6 and C6-7. Soft tissues and spinal canal: Vascular calcifications are noted. No acute soft tissue abnormality is noted. Upper chest: Visualized upper chest is unremarkable. Other: None IMPRESSION: CT of the head: No acute intracranial abnormality noted. CT of the cervical spine: Multilevel degenerative change without acute abnormality. Electronically Signed   By:  Inez Catalina M.D.   On: 10/19/2017 16:21   Ct Cervical Spine Wo Contrast  Result Date: 10/16/2017 CLINICAL DATA:  Altered mental status and recent fall EXAM: CT HEAD WITHOUT CONTRAST CT CERVICAL SPINE WITHOUT CONTRAST TECHNIQUE: Multidetector CT imaging of the head and cervical spine was performed following the standard protocol without intravenous contrast. Multiplanar CT image reconstructions of the cervical spine were also generated. COMPARISON:  None. FINDINGS: CT HEAD FINDINGS Brain: No evidence of acute infarction, hemorrhage, hydrocephalus, extra-axial collection or mass lesion/mass effect. Vascular: No hyperdense vessel or unexpected calcification. Skull: Normal. Negative for fracture or focal lesion. Sinuses/Orbits: No acute finding. Other: None. CT CERVICAL SPINE FINDINGS Alignment: Within normal limits. Skull base and vertebrae: 7 cervical segments are well visualized. Vertebral body height is well maintained. Facet hypertrophic changes are noted throughout the cervical spine. No acute fracture or acute facet abnormality is noted. Osteophytic changes are noted most marked at C5-6 and C6-7. Soft tissues and spinal canal: Vascular calcifications are noted. No acute soft tissue abnormality is noted. Upper chest: Visualized upper chest is unremarkable. Other: None IMPRESSION: CT of the head: No acute intracranial abnormality noted. CT of the cervical spine: Multilevel degenerative change without acute abnormality. Electronically Signed   By: Inez Catalina M.D.   On: 10/19/2017 16:21     Assessment/Plan Principal Problem:   Severe sepsis (HCC) Active Problems:   TOBACCO ABUSE   HYPERTENSION, BENIGN ESSENTIAL   DVT of lower extremity, bilateral (HCC)   COPD, moderate (HCC)   Leucocytosis   Chronic renal disease, stage 3, moderately decreased glomerular filtration rate (GFR) between 30-59 mL/min/1.73 square meter (HCC)   Loose stools  Acute kidney injury superimposed on CKD (HCC)    Rhabdomyolysis   Hypokalemia   Altered mental status  Severe sepsis: Unknown etiology was most likely associated with diarrhea of unknown origin.  Patient denies any abdominal pain.  Patient presented with severe lactic acidosis and leukocytosis.  Blood pressure stable. Chest x-ray did not show any pneumonia.  UA was not impressive for UTI Blood cultures and urine culture have been sent. Patient has been started on broad-spectrum antibiotics.  Will monitor lactic acid.  Altered mental status: Most likely secondary to dehydration, acute kidney injury and sepsis.  We will continue to monitor her status.  We will also check UDS.  Patient was also on Xanax and oxycodone at home.  We will hold these.  Diarrhoea:We have sent C. difficile, GI pathogen panel.  Will insert a rectal tube  Fall/rhabdomyolysis: Most likely secondary to generalized weakness secondary to diarrhea and dehydration.  We will continue IV fluids aggressively and continue to monitor CK.  She will be evaluated by physical therapy later on.  Acute kidney injury on CKD: Her creatinine on 09/08/17 was 1.3.  Presented with severe acute kidney injury .  Most likely this is prerenal and improved with IV fluids.  Will consider nephrology consultation tomorrow if he does not improve.  Will insert Foley catheter.  Smoker/history of COPD: Currently COPD is stable.  She follows with pulmonology as an outpatient.  Continue to monitor respiratory status.  She was hypoxic on presentation most likely secondary to sepsis.  History of DVT/hypercoagulability: On Xarelto which we will continue  Hypokalemia: Severe.  Most likely acid with diarrhea.  We will also check magnesium level.  We will continue aggressive repletion.  Hypertension: Currently blood pressure stable.  She is  on Irbesartan at home which we will hold due to acute kidney injury.    Severity of Illness:    I certify that at the point of admission it is my clinical judgment  that the patient will require inpatient hospital care spanning beyond 2 midnights from the point of admission due to high intensity of service, high risk for further deterioration and high frequency of surveillance required.    DVT prophylaxis: Xarelto Code Status: Full Family Communication: Discussed with the sister on phone Consults called: PCCM     Marene Lenz MD [facility] Pager [phone]  If 7PM-7AM, please contact night-coverage [email or web address] Password [facility]  10/23/2017, 5:39 PM

## 2017-10-26 NOTE — ED Notes (Signed)
In and out catheter patient twice. No urine output.

## 2017-10-26 NOTE — ED Notes (Signed)
This RN attempted to insert foley catheter x2 unsuccessfully. Will page hospitalist to suggest a urology consult for foley catheter insertion.

## 2017-10-26 NOTE — ED Notes (Addendum)
This Probation officer was called to Rm 24 by nurse because the patients daughter had spiked and hung a bag of IV fluids for the patient. Pts nurse advised the daughter she is not allowed to administer meds, including IV fluids. Daughter became verbally abusive to the RN who called me to room.  Pts daughter was invited into a private room and discussed concerns. She advised me that she is a Airline pilot and then said she is a Journalist, newspaper and that giving IV fluids isnt giving medications. She agreed to no longer intervene in patient care.  Charge Nurse Tim and [facility] made aware, as patient will be going to ICU

## 2017-10-26 NOTE — Progress Notes (Addendum)
Pharmacy Antibiotic Note  Brooke Garcia is a 62 y.o. female admitted on 11/04/2017 with sepsis. Patient found on floor covered in stool and urine. Pharmacy has been consulted for vancomycin and zosyn dosing.  Today, 10/19/2017  AKI noted  + leukocytosis  Afebrile, + hypothermia  Plan:  Vancomycin 1gm x1 in ED, give additional 1gm x1   Check level in am  Start maintenance dose when Renal fx stable  Zosyn 2.25gm IV q6h based on current renal function  Daily SCr  Await culture results to de-escalate     Temp (24hrs), Avg:98.8 F (37.1 C), Min:98.8 F (37.1 C), Max:98.8 F (37.1 C)  Recent Labs  Lab 10/27/2017 1429 11/05/2017 1451 11/07/2017 1736  WBC 30.5*  --   --   CREATININE 5.10* 4.90*  --   LATICACIDVEN  --  5.79* 3.81*    CrCl cannot be calculated (Unknown ideal weight.).    Allergies  Allergen Reactions  . Butalbital-Apap-Caffeine Hives  . Naproxen Hives  . Tylenol [Acetaminophen] Other (See Comments)    Upset stomach    Antimicrobials this admission: 2/13 vancomycin >> 2/13 zosyn >>  Dose adjustments this admission:  Microbiology results: 2/13 C. Difficile:  2/13 GI panel: 2/13 BCx:  2/13 UCx:    Thank you for allowing pharmacy to be a part of this patient's care.  Doreene Eland, PharmD, BCPS.   Pager: [phone] 10/22/2017 5:55 PM

## 2017-10-26 NOTE — ED Triage Notes (Signed)
Patient arrive via [facility] from home. Patient was found on the floor. Family had nott her from her in 4 days. Laying in floor with feeces and urine. Unknown LOC and unknown fall time. Patient is living in a hotel room right now. Patient on blood thinners. No neuro deficits per EMS. A/) x4. Patient does not recall the fall/

## 2017-10-26 NOTE — ED Notes (Signed)
Patient transported to CT 

## 2017-10-26 NOTE — ED Notes (Signed)
This RN walked into patient's room greeted by the patient's daughter. Patient's daughter explained to this RN that she took care of the patient's IV fluid bags that were empty and that she spiked and hung another one when this RN was not in the room. This RN explained to the patient's daughter that this is illegal and that it will not happen again. Security and Abigail Butts (AD) present for conversation with the patient's daughter. The situation has been solved and the daughter has been made aware of the consequences if she presents with the same behavior in the future.

## 2017-10-27 ENCOUNTER — Inpatient Hospital Stay (HOSPITAL_COMMUNITY): Payer: Medicare Other

## 2017-10-27 ENCOUNTER — Other Ambulatory Visit: Payer: Self-pay

## 2017-10-27 DIAGNOSIS — R748 Abnormal levels of other serum enzymes: Secondary | ICD-10-CM

## 2017-10-27 DIAGNOSIS — L8990 Pressure ulcer of unspecified site, unspecified stage: Secondary | ICD-10-CM | POA: Insufficient documentation

## 2017-10-27 DIAGNOSIS — N814 Uterovaginal prolapse, unspecified: Secondary | ICD-10-CM | POA: Insufficient documentation

## 2017-10-27 LAB — GASTROINTESTINAL PANEL BY PCR, STOOL (REPLACES STOOL CULTURE)

## 2017-10-27 LAB — URINALYSIS, ROUTINE W REFLEX MICROSCOPIC
Bilirubin Urine: NEGATIVE
Glucose, UA: NEGATIVE mg/dL
KETONES UR: NEGATIVE mg/dL
Leukocytes, UA: NEGATIVE
Nitrite: NEGATIVE
PH: 5 (ref 5.0–8.0)
Protein, ur: 100 mg/dL — AB
SPECIFIC GRAVITY, URINE: 1.019 (ref 1.005–1.030)

## 2017-10-27 LAB — CBC WITH DIFFERENTIAL/PLATELET
BASOS PCT: 0 %
Basophils Absolute: 0 10*3/uL (ref 0.0–0.1)
Eosinophils Absolute: 0 10*3/uL (ref 0.0–0.7)
Eosinophils Relative: 0 %
HCT: 44.8 % (ref 36.0–46.0)
HEMOGLOBIN: 16.2 g/dL — AB (ref 12.0–15.0)
Lymphocytes Relative: 10 %
Lymphs Abs: 2.4 10*3/uL (ref 0.7–4.0)
MCH: 34.5 pg — ABNORMAL HIGH (ref 26.0–34.0)
MCHC: 36.2 g/dL — ABNORMAL HIGH (ref 30.0–36.0)
MCV: 95.5 fL (ref 78.0–100.0)
Monocytes Absolute: 1.5 10*3/uL — ABNORMAL HIGH (ref 0.1–1.0)
Monocytes Relative: 6 %
NEUTROS PCT: 84 %
Neutro Abs: 19.5 10*3/uL — ABNORMAL HIGH (ref 1.7–7.7)
Platelets: 174 10*3/uL (ref 150–400)
RBC: 4.69 MIL/uL (ref 3.87–5.11)
RDW: 13.7 % (ref 11.5–15.5)
WBC: 23.4 10*3/uL — AB (ref 4.0–10.5)

## 2017-10-27 LAB — MAGNESIUM: MAGNESIUM: 1.9 mg/dL (ref 1.7–2.4)

## 2017-10-27 LAB — COMPREHENSIVE METABOLIC PANEL
ALBUMIN: 3 g/dL — AB (ref 3.5–5.0)
ALT: 91 U/L — ABNORMAL HIGH (ref 14–54)
ANION GAP: 19 — AB (ref 5–15)
AST: 231 U/L — AB (ref 15–41)
Alkaline Phosphatase: 69 U/L (ref 38–126)
BILIRUBIN TOTAL: 1.5 mg/dL — AB (ref 0.3–1.2)
BUN: 62 mg/dL — AB (ref 6–20)
CO2: 15 mmol/L — ABNORMAL LOW (ref 22–32)
Calcium: 8 mg/dL — ABNORMAL LOW (ref 8.9–10.3)
Chloride: 111 mmol/L (ref 101–111)
Creatinine, Ser: 5.07 mg/dL — ABNORMAL HIGH (ref 0.44–1.00)
GFR calc Af Amer: 10 mL/min — ABNORMAL LOW (ref >60)
GFR calc non Af Amer: 8 mL/min — ABNORMAL LOW (ref >60)
GLUCOSE: 111 mg/dL — AB (ref 65–99)
POTASSIUM: 3.4 mmol/L — AB (ref 3.5–5.1)
Sodium: 145 mmol/L (ref 135–145)
TOTAL PROTEIN: 6.9 g/dL (ref 6.5–8.1)

## 2017-10-27 LAB — RAPID URINE DRUG SCREEN, HOSP PERFORMED
Amphetamines: NOT DETECTED
BARBITURATES: NOT DETECTED
BENZODIAZEPINES: POSITIVE — AB
COCAINE: NOT DETECTED
OPIATES: POSITIVE — AB
Tetrahydrocannabinol: NOT DETECTED

## 2017-10-27 LAB — LACTIC ACID, PLASMA
LACTIC ACID, VENOUS: 2.2 mmol/L — AB (ref 0.5–1.9)
Lactic Acid, Venous: 2.6 mmol/L (ref 0.5–1.9)

## 2017-10-27 LAB — VANCOMYCIN, RANDOM: VANCOMYCIN RM: 53 — AB

## 2017-10-27 LAB — CK: CK TOTAL: 6917 U/L — AB (ref 38–234)

## 2017-10-27 LAB — PHOSPHORUS: PHOSPHORUS: 4.2 mg/dL (ref 2.5–4.6)

## 2017-10-27 LAB — APTT: APTT: 34 s (ref 24–36)

## 2017-10-27 LAB — HIV ANTIBODY (ROUTINE TESTING W REFLEX): HIV SCREEN 4TH GENERATION: NONREACTIVE

## 2017-10-27 LAB — SODIUM, URINE, RANDOM: SODIUM UR: 22 mmol/L

## 2017-10-27 LAB — HEPARIN LEVEL (UNFRACTIONATED)
Heparin Unfractionated: <0.1 IU/mL — ABNORMAL LOW (ref 0.30–0.70)
Heparin Unfractionated: 0.19 IU/mL — ABNORMAL LOW (ref 0.30–0.70)

## 2017-10-27 MED ORDER — HEPARIN (PORCINE) IN NACL 100-0.45 UNIT/ML-% IJ SOLN
1300.0000 [IU]/h | INTRAMUSCULAR | Status: DC
  Administered 2017-10-27: 1200 [IU]/h via INTRAVENOUS
  Administered 2017-10-28 – 2017-10-30 (×4): 1400 [IU]/h via INTRAVENOUS
  Filled 2017-10-27 (×5): qty 250

## 2017-10-27 MED ORDER — POTASSIUM CHLORIDE CRYS ER 20 MEQ PO TBCR
20.0000 meq | EXTENDED_RELEASE_TABLET | Freq: Once | ORAL | Status: AC
  Administered 2017-10-27: 20 meq via ORAL
  Filled 2017-10-27: qty 1

## 2017-10-27 MED ORDER — STERILE WATER FOR INJECTION IV SOLN
INTRAVENOUS | Status: DC
  Administered 2017-10-27 – 2017-10-30 (×8): via INTRAVENOUS
  Filled 2017-10-27 (×13): qty 850

## 2017-10-27 MED ORDER — HEPARIN BOLUS VIA INFUSION
2000.0000 [IU] | Freq: Once | INTRAVENOUS | Status: AC
  Administered 2017-10-27: 2000 [IU] via INTRAVENOUS
  Filled 2017-10-27: qty 2000

## 2017-10-27 MED ORDER — IOPAMIDOL (ISOVUE-300) INJECTION 61%
15.0000 mL | Freq: Two times a day (BID) | INTRAVENOUS | Status: DC | PRN
  Administered 2017-10-27: 15 mL via ORAL
  Filled 2017-10-27: qty 30

## 2017-10-27 MED ORDER — FENTANYL CITRATE (PF) 100 MCG/2ML IJ SOLN: 50.0000 ug | Freq: Once | INTRAMUSCULAR | Status: DC

## 2017-10-27 NOTE — Progress Notes (Signed)
CRITICAL VALUE ALERT  Critical Value:  Vanc 53  Date & Time Notified:  10/27/2017 @0445   Provider Notified: Pharmacist Grimsley  Orders Received/Actions taken: No further orders

## 2017-10-27 NOTE — Progress Notes (Signed)
ANTICOAGULATION CONSULT NOTE - Initial Consult  Pharmacy Consult for Heparin Indication: History of DVTs  Allergies  Allergen Reactions  . Butalbital-Apap-Caffeine Hives  . Naproxen Hives  . Tylenol [Acetaminophen] Other (See Comments)    Upset stomach    Patient Measurements:    Height: 63 inches Weight: 88.5kg (Dec 2017) Heparin Dosing Weight: 72.4kg  Vital Signs: Temp: 98.8 F (37.1 C) (02/14 0708) Temp Source: Axillary (02/14 0708) BP: 153/134 (02/14 0629) Pulse Rate: 92 (02/14 0629)  Labs: Recent Labs    10/20/2017 1429 10/28/2017 1451 10/27/17 0345  HGB 18.1* 18.0*  --   HCT 48.4* 53.0*  --   PLT 266  --   --   LABPROT 14.0  --   --   INR 1.09  --   --   CREATININE 5.10* 4.90* 5.07*  CKTOTAL 9,306*  --  6,917*    CrCl cannot be calculated (Unknown ideal weight.).   Medical History: Past Medical History:  Diagnosis Date  . Acute venous embolism and thrombosis of unspecified deep vessels of lower extremity   . Anxiety state, unspecified   . Arthritis   . Blood dyscrasia    protein def.  . Calculus of kidney   . Colitis   . Complete rupture of rotator cuff   . COPD (chronic obstructive pulmonary disease) ([place])   . Degeneration of intervertebral disc, site unspecified   . Depression   . DVT (deep venous thrombosis) ([place])   . Family history of ischemic heart disease   . History of gallstones   . HLD (hyperlipidemia)   . HTN (hypertension)   . Other and unspecified noninfectious gastroenteritis and colitis(558.9)   . Painful respiration   . Primary hypercoagulable state ([place])   . Protein S deficiency ([place])   . Renal failure   . Tobacco use disorder   . Unspecified urinary incontinence   . Venous stasis ulcers (HCC)    left leg     Medications:  Scheduled:  . fentaNYL (SUBLIMAZE) injection  50 mcg Intravenous Once  . levalbuterol  0.63 mg Nebulization Q6H  . nicotine  14 mg Transdermal Daily  . thiamine  100 mg Oral Daily   Infusions:  .  sodium chloride 50 mL/hr at 10/27/17 0230  . 0.9 % NaCl with KCl 20 mEq / L 75 mL/hr at 10/27/17 0120  . heparin    . piperacillin-tazobactam (ZOSYN)  IV 2.25 g (10/27/17 4034)   PRN: LORazepam  Assessment: 62 yo female admitted 2/13 with altered mental status and severe sepsis of unknown etiology after being found in a hotel.  Patient was on Xarelto for history of DVTs.  Pharmacy consulted to dose IV heparin since cannot use Xarelto with acute renal failure  Goal of Therapy:  Heparin level 0.3-0.7 units/ml  PTT 66-102 seconds Monitor platelets by anticoagulation protocol: Yes   Plan:   No heparin bolus since unknown previous Xarelto dose  Heparin IV infusion 1200 units/hr  Check PTT in 8hr (heparin level may be falsely elevated with recent Xarelto use).  Once the heparin level and PTT correlate, can adjust heparin rate based on heparin levels only  Daily heparin level and CBC  Peggyann Juba, PharmD, BCPS Pager: [phone] 10/27/2017,8:03 AM

## 2017-10-27 NOTE — Progress Notes (Signed)
ANTICOAGULATION CONSULT NOTE -  Pharmacy Consult for Heparin Indication: History of DVTs  Allergies  Allergen Reactions  . Butalbital-Apap-Caffeine Hives  . Naproxen Hives  . Tylenol [Acetaminophen] Other (See Comments)    Upset stomach    Patient Measurements:    Height: 63 inches Weight: 88.5kg (Dec 2017) Heparin Dosing Weight: 72.4kg  Vital Signs: Temp: 98.4 F (36.9 C) (02/14 1600) Temp Source: Oral (02/14 1600) BP: 142/109 (02/14 1700) Pulse Rate: 82 (02/14 1700)  Labs: Recent Labs    10/15/2017 1429 10/17/2017 1451 10/27/17 0345 10/27/17 0816 10/27/17 1742  HGB 18.1* 18.0*  --  16.2*  --   HCT 48.4* 53.0*  --  44.8  --   PLT 266  --   --  174  --   APTT  --   --   --   --  34  LABPROT 14.0  --   --   --   --   INR 1.09  --   --   --   --   HEPARINUNFRC  --   --   --  <0.10* 0.19*  CREATININE 5.10* 4.90* 5.07*  --   --   CKTOTAL 9,306*  --  6,917*  --   --     CrCl cannot be calculated (Unknown ideal weight.).   Medical History: Past Medical History:  Diagnosis Date  . Acute venous embolism and thrombosis of unspecified deep vessels of lower extremity   . Anxiety state, unspecified   . Arthritis   . Blood dyscrasia    protein def.  . Calculus of kidney   . Colitis   . Complete rupture of rotator cuff   . COPD (chronic obstructive pulmonary disease) ()   . Degeneration of intervertebral disc, site unspecified   . Depression   . DVT (deep venous thrombosis) ([place])   . Family history of ischemic heart disease   . History of gallstones   . HLD (hyperlipidemia)   . HTN (hypertension)   . Other and unspecified noninfectious gastroenteritis and colitis(558.9)   . Painful respiration   . Primary hypercoagulable state ([place])   . Protein S deficiency ([place])   . Renal failure   . Tobacco use disorder   . Unspecified urinary incontinence   . Venous stasis ulcers (HCC)    left leg     Medications:  Scheduled:  . fentaNYL (SUBLIMAZE) injection  50  mcg Intravenous Once  . levalbuterol  0.63 mg Nebulization Q6H  . nicotine  14 mg Transdermal Daily  . thiamine  100 mg Oral Daily   Infusions:  . heparin 1,200 Units/hr (10/27/17 1011)  . piperacillin-tazobactam (ZOSYN)  IV Stopped (10/27/17 1735)  .  sodium bicarbonate (isotonic) infusion in sterile water 100 mL/hr at 10/27/17 1014   PRN: iopamidol, LORazepam  Assessment: 62 yo female admitted 2/13 with altered mental status and severe sepsis of unknown etiology after being found in a hotel.  Patient was on Xarelto for history of DVTs.  Pharmacy consulted to dose IV heparin since cannot use Xarelto with acute renal failure  1st heparin level sub-therapeutic at 0.19 APTT 34 No bleeding or other issues per RN  Goal of Therapy:  Heparin level 0.3-0.7 units/ml  PTT 66-102 seconds Monitor platelets by anticoagulation protocol: Yes   Plan:   Bolus heparin 2000 units x 1 then  Increase Heparin IV infusion to 1400 units/hr  Heparin level indicative of pt not recently taking xarelto, will use only heparin levels to adjust  Heparin level in 6 hours  Daily heparin level and CBC  Dolly Rias RPh 10/27/2017, 7:10 PM Pager [phone]

## 2017-10-27 NOTE — ED Notes (Signed)
ED TO INPATIENT HANDOFF REPORT  Name/Age/Gender Brooke Garcia 62 y.o. female  Code Status    Code Status Orders  (From admission, onward)        Start     Ordered   10/20/2017 1737  Full code  Continuous     10/20/2017 1737    Code Status History    Date Active Date Inactive Code Status Order ID Comments User Context   05/08/2015 17:05 05/12/2015 14:07 Full Code [id number]  Caren Griffins, MD ED      Home/SNF/Other Home  Chief Complaint Hypokalemia [E87.6] Lactic acidosis [E87.2] Fall, initial encounter [W19.XXXA] Acute renal failure, unspecified acute renal failure type ([place]) [N17.9]  Level of Care/Admitting Diagnosis ED Disposition    ED Disposition Condition [facility] Area: [place] [[id number]]  Level of Care: Stepdown [14]  Admit to SDU based on following criteria: Hemodynamic compromise or significant risk of instability:  Patient requiring short term acute titration and management of vasoactive drips, and invasive monitoring (i.e., CVP and Arterial line).  Diagnosis: Severe sepsis [facility]) [7846962]  Admitting Physician: Marene Lenz [[id number]]  Attending Physician: Jodie Echevaria, AMRIT [[id number]]  Estimated length of stay: past midnight tomorrow  Certification:: I certify this patient will need inpatient services for at least 2 midnights  PT Class (Do Not Modify): Inpatient [101]  PT Acc Code (Do Not Modify): Private [1]       Medical History Past Medical History:  Diagnosis Date  . Acute venous embolism and thrombosis of unspecified deep vessels of lower extremity   . Anxiety state, unspecified   . Arthritis   . Blood dyscrasia    protein def.  . Calculus of kidney   . Colitis   . Complete rupture of rotator cuff   . COPD (chronic obstructive pulmonary disease) ([place])   . Degeneration of intervertebral disc, site unspecified   . Depression   . DVT (deep venous thrombosis) ([place])   . Family history of ischemic heart  disease   . History of gallstones   . HLD (hyperlipidemia)   . HTN (hypertension)   . Other and unspecified noninfectious gastroenteritis and colitis(558.9)   . Painful respiration   . Primary hypercoagulable state ([place])   . Protein S deficiency ([place])   . Renal failure   . Tobacco use disorder   . Unspecified urinary incontinence   . Venous stasis ulcers (HCC)    left leg    Allergies Allergies  Allergen Reactions  . Butalbital-Apap-Caffeine Hives  . Naproxen Hives  . Tylenol [Acetaminophen] Other (See Comments)    Upset stomach    IV Location/Drains/Wounds Patient Lines/Drains/Airways Status   Active Line/Drains/Airways    Name:   Placement date:   Placement time:   Site:   Days:   Peripheral IV 10/19/2017 Left Antecubital   10/28/2017    1440    Antecubital   1   AIRWAYS   04/27/12    1226     2009   AIRWAYS   04/27/12    1226     2009   Airway   04/27/12    1229     2009   Pressure Ulcer 09/08/15 Stage III -  Full thickness tissue loss. Subcutaneous fat may be visible but bone, tendon or muscle are NOT exposed. Venus stasis ulcer that pt states has had for over 2 months and on going treatment at the wound care clinic.   09/08/15  2306     780          Labs/Imaging Results for orders placed or performed during the hospital encounter of 10/23/2017 (from the past 48 hour(s))  Comprehensive metabolic panel     Status: Abnormal   Collection Time: 10/25/2017  2:29 PM  Result Value Ref Range   Sodium 144 135 - 145 mmol/L   Potassium 2.9 (L) 3.5 - 5.1 mmol/L   Chloride 100 (L) 101 - 111 mmol/L   CO2 18 (L) 22 - 32 mmol/L   Glucose, Bld 138 (H) 65 - 99 mg/dL   BUN 58 (H) 6 - 20 mg/dL   Creatinine, Ser 5.10 (H) 0.44 - 1.00 mg/dL   Calcium 9.8 8.9 - 10.3 mg/dL   Total Protein 9.4 (H) 6.5 - 8.1 g/dL   Albumin 4.1 3.5 - 5.0 g/dL   AST 245 (H) 15 - 41 U/L   ALT 97 (H) 14 - 54 U/L   Alkaline Phosphatase 97 38 - 126 U/L   Total Bilirubin 1.6 (H) 0.3 - 1.2 mg/dL   GFR calc non Af  Amer 8 (L) >60 mL/min   GFR calc Af Amer 10 (L) >60 mL/min    Comment: (NOTE) The eGFR has been calculated using the CKD EPI equation. This calculation has not been validated in all clinical situations. eGFR's persistently <60 mL/min signify possible Chronic Kidney Disease.    Anion gap 26 (H) 5 - 15    Comment: REPEATED TO VERIFY Performed at [facility], [place] [address] [place], [place] [address]   CBC WITH DIFFERENTIAL     Status: Abnormal   Collection Time: 11/08/2017  2:29 PM  Result Value Ref Range   WBC 30.5 (H) 4.0 - 10.5 K/uL   RBC 5.14 (H) 3.87 - 5.11 MIL/uL   Hemoglobin 18.1 (H) 12.0 - 15.0 g/dL   HCT 48.4 (H) 36.0 - 46.0 %   MCV 94.2 78.0 - 100.0 fL   MCH 35.2 (H) 26.0 - 34.0 pg   MCHC 37.4 (H) 30.0 - 36.0 g/dL    Comment: REPEATED TO VERIFY RULED OUT INTERFERING SUBSTANCES    RDW 13.4 11.5 - 15.5 %   Platelets 266 150 - 400 K/uL   Neutrophils Relative % 86 %   Lymphocytes Relative 11 %   Monocytes Relative 3 %   Eosinophils Relative 0 %   Basophils Relative 0 %   Neutro Abs 26.2 (H) 1.7 - 7.7 K/uL   Lymphs Abs 3.4 0.7 - 4.0 K/uL   Monocytes Absolute 0.9 0.1 - 1.0 K/uL   Eosinophils Absolute 0.0 0.0 - 0.7 K/uL   Basophils Absolute 0.0 0.0 - 0.1 K/uL   Smear Review MORPHOLOGY UNREMARKABLE     Comment: Performed at [facility], [place] [address] [place], [place] [address]  Ammonia     Status: None   Collection Time: 11/07/2017  2:29 PM  Result Value Ref Range   Ammonia 17 9 - 35 umol/L    Comment: Performed at [facility], [place] [address] [place], [place] [address]  Ethanol     Status: None   Collection Time: 11/09/2017  2:29 PM  Result Value Ref Range   Alcohol, Ethyl (B) <10 <10 mg/dL    Comment:        LOWEST DETECTABLE LIMIT FOR SERUM ALCOHOL IS 10 mg/dL FOR MEDICAL PURPOSES ONLY Performed at [facility], [place] [address] [place], [place] [address]   Protime-INR  Status: None    Collection Time: 10/29/2017  2:29 PM  Result Value Ref Range   Prothrombin Time 14.0 11.4 - 15.2 seconds   INR 1.09     Comment: Performed at [facility], [place] [address] [place], [place] [address]  CK     Status: Abnormal   Collection Time: 10/18/2017  2:29 PM  Result Value Ref Range   Total CK 9,306 (H) 38 - 234 U/L    Comment: RESULTS CONFIRMED BY MANUAL DILUTION Performed at [facility], [place] [address], [place], [place] [address]   Blood gas, arterial ([facility] & [facility] ONLY)     Status: Abnormal   Collection Time: 10/23/2017  2:30 PM  Result Value Ref Range   FIO2 0.21    pH, Arterial 7.476 (H) 7.350 - 7.450   pCO2 arterial below reportable range 32.0 - 48.0 mmHg    Comment: rbv dr Gilford Raid by Eben Burow rrt rcp on 10/23/2017 at 1433   pO2, Arterial 75.8 (L) 83.0 - 108.0 mmHg   Bicarbonate unable to calculate due to low co2 20.0 - 28.0 mmol/L   Acid-Base Excess unable to calculate 0.0 - 2.0 mmol/L   Acid-base deficit unable to calculate 0.0 - 2.0 mmol/L   O2 Saturation 94.0 %   Patient temperature 93.0    Collection site RIGHT BRACHIAL    Drawn by [id number]    Sample type ARTERIAL DRAW    Allens test (pass/fail) PASS PASS    Comment: Performed at [facility], [place] [address], [place], [place] [address]  CBG monitoring, ED     Status: Abnormal   Collection Time: 11/05/2017  2:35 PM  Result Value Ref Range   Glucose-Capillary 132 (H) 65 - 99 mg/dL  I-Stat CG4 Lactic Acid, ED     Status: Abnormal   Collection Time: 10/19/2017  2:51 PM  Result Value Ref Range   Lactic Acid, Venous 5.79 (HH) 0.5 - 1.9 mmol/L   Comment NOTIFIED PHYSICIAN   I-Stat Chem 8, ED     Status: Abnormal   Collection Time: 10/30/2017  2:51 PM  Result Value Ref Range   Sodium 144 135 - 145 mmol/L   Potassium 2.8 (L) 3.5 - 5.1 mmol/L   Chloride 103 101 - 111 mmol/L   BUN 51 (H) 6 - 20 mg/dL   Creatinine, Ser 4.90 (H) 0.44 - 1.00 mg/dL   Glucose, Bld 126 (H) 65 -  99 mg/dL   Calcium, Ion 1.02 (L) 1.15 - 1.40 mmol/L   TCO2 21 (L) 22 - 32 mmol/L   Hemoglobin 18.0 (H) 12.0 - 15.0 g/dL   HCT 53.0 (H) 36.0 - 46.0 %  I-stat troponin, ED     Status: Abnormal   Collection Time: 10/15/2017  5:34 PM  Result Value Ref Range   Troponin i, poc 0.48 (HH) 0.00 - 0.08 ng/mL   Comment NOTIFIED PHYSICIAN    Comment 3            Comment: Due to the release kinetics of cTnI, a negative result within the first hours of the onset of symptoms does not rule out myocardial infarction with certainty. If myocardial infarction is still suspected, repeat the test at appropriate intervals.   I-Stat CG4 Lactic Acid, ED  (not at  [facility])     Status: Abnormal   Collection Time: 10/29/2017  5:36 PM  Result Value Ref Range   Lactic Acid, Venous 3.81 (HH) 0.5 - 1.9 mmol/L  Comment NOTIFIED PHYSICIAN   Brain natriuretic peptide     Status: Abnormal   Collection Time: 10/14/2017  5:36 PM  Result Value Ref Range   B Natriuretic Peptide 311.0 (H) 0.0 - 100.0 pg/mL    Comment: Performed at [facility], [place] [address] [place], [place] [address]  C difficile quick scan w PCR reflex     Status: None   Collection Time: 10/22/2017  7:00 PM  Result Value Ref Range   C Diff antigen NEGATIVE NEGATIVE   C Diff toxin NEGATIVE NEGATIVE   C Diff interpretation No C. difficile detected.     Comment: Performed at [facility], [place] [address] [place], [place] [address]  Blood gas, arterial     Status: Abnormal   Collection Time: 10/20/2017  7:20 PM  Result Value Ref Range   O2 Content 3.0 L/min   pH, Arterial 7.421 7.350 - 7.450   pCO2 arterial 21.6 (L) 32.0 - 48.0 mmHg   pO2, Arterial 85.7 83.0 - 108.0 mmHg   Bicarbonate 13.8 (L) 20.0 - 28.0 mmol/L   Acid-base deficit 8.3 (H) 0.0 - 2.0 mmol/L   O2 Saturation 95.5 %   Patient temperature 98.6    Collection site RIGHT RADIAL    Drawn by [id number]    Sample type ARTERIAL DRAW    Allens test (pass/fail) PASS  PASS    Comment: Performed at [facility], [place] [address], [place], [place] [address]   Dg Chest 2 View  Result Date: 11/06/2017 CLINICAL DATA:  Fall. EXAM: CHEST  2 VIEW COMPARISON:  CT chest dated October 05, 2017. Chest x-ray dated May 16, 2015. FINDINGS: Stable cardiomediastinal silhouette. Normal pulmonary vascularity. Atherosclerotic calcification of the aortic arch. No focal consolidation, pleural effusion, or pneumothorax. No acute osseous abnormality. IMPRESSION: No active cardiopulmonary disease. Electronically Signed   By: Titus Dubin M.D.   On: 10/24/2017 16:25   Dg Pelvis 1-2 Views  Result Date: 10/31/2017 CLINICAL DATA:  Recent fall EXAM: PELVIS - 1-2 VIEW COMPARISON:  None. FINDINGS: Pelvic ring is intact. No acute fracture or dislocation is seen. Degenerative changes of lumbar spine are noted. No soft tissue abnormality is noted. IMPRESSION: No acute abnormality seen. Electronically Signed   By: Inez Catalina M.D.   On: 10/28/2017 16:24   Ct Head Wo Contrast  Result Date: 10/23/2017 CLINICAL DATA:  Altered mental status and recent fall EXAM: CT HEAD WITHOUT CONTRAST CT CERVICAL SPINE WITHOUT CONTRAST TECHNIQUE: Multidetector CT imaging of the head and cervical spine was performed following the standard protocol without intravenous contrast. Multiplanar CT image reconstructions of the cervical spine were also generated. COMPARISON:  None. FINDINGS: CT HEAD FINDINGS Brain: No evidence of acute infarction, hemorrhage, hydrocephalus, extra-axial collection or mass lesion/mass effect. Vascular: No hyperdense vessel or unexpected calcification. Skull: Normal. Negative for fracture or focal lesion. Sinuses/Orbits: No acute finding. Other: None. CT CERVICAL SPINE FINDINGS Alignment: Within normal limits. Skull base and vertebrae: 7 cervical segments are well visualized. Vertebral body height is well maintained. Facet hypertrophic changes are noted throughout the  cervical spine. No acute fracture or acute facet abnormality is noted. Osteophytic changes are noted most marked at C5-6 and C6-7. Soft tissues and spinal canal: Vascular calcifications are noted. No acute soft tissue abnormality is noted. Upper chest: Visualized upper chest is unremarkable. Other: None IMPRESSION: CT of the head: No acute intracranial abnormality noted. CT of the cervical spine: Multilevel degenerative change without acute abnormality. Electronically Signed  By: Inez Catalina M.D.   On: 10/18/2017 16:21   Ct Cervical Spine Wo Contrast  Result Date: 10/14/2017 CLINICAL DATA:  Altered mental status and recent fall EXAM: CT HEAD WITHOUT CONTRAST CT CERVICAL SPINE WITHOUT CONTRAST TECHNIQUE: Multidetector CT imaging of the head and cervical spine was performed following the standard protocol without intravenous contrast. Multiplanar CT image reconstructions of the cervical spine were also generated. COMPARISON:  None. FINDINGS: CT HEAD FINDINGS Brain: No evidence of acute infarction, hemorrhage, hydrocephalus, extra-axial collection or mass lesion/mass effect. Vascular: No hyperdense vessel or unexpected calcification. Skull: Normal. Negative for fracture or focal lesion. Sinuses/Orbits: No acute finding. Other: None. CT CERVICAL SPINE FINDINGS Alignment: Within normal limits. Skull base and vertebrae: 7 cervical segments are well visualized. Vertebral body height is well maintained. Facet hypertrophic changes are noted throughout the cervical spine. No acute fracture or acute facet abnormality is noted. Osteophytic changes are noted most marked at C5-6 and C6-7. Soft tissues and spinal canal: Vascular calcifications are noted. No acute soft tissue abnormality is noted. Upper chest: Visualized upper chest is unremarkable. Other: None IMPRESSION: CT of the head: No acute intracranial abnormality noted. CT of the cervical spine: Multilevel degenerative change without acute abnormality. Electronically  Signed   By: Inez Catalina M.D.   On: 10/27/2017 16:21    Pending Labs Unresulted Labs (From admission, onward)   Start     Ordered   10/27/17 0500  CK  Daily,   R     10/14/2017 1732   10/27/17 0500  Comprehensive metabolic panel  Tomorrow morning,   R     11/05/2017 1737   10/27/17 0500  Creatinine, serum  Daily,   R     11/07/2017 1804   10/27/17 0500  Vancomycin, random  Tomorrow morning,   R     11/03/2017 1804   10/27/17 0500  Phosphorus  Tomorrow morning,   R     10/24/2017 1859   10/27/17 0500  Magnesium  Tomorrow morning,   R     10/27/2017 1859   10/21/2017 2902  Basic metabolic panel  Once-Timed,   R     11/05/2017 1858   10/21/2017 1900  Lactic acid, plasma  STAT Now then every 3 hours,   R     10/18/2017 1731   10/24/2017 1900  Magnesium  Once,   R     10/31/2017 1731   10/14/2017 1736  HIV antibody (Routine Testing)  Once,   R     10/17/2017 1737   10/21/2017 1733  Gastrointestinal Panel by PCR , Stool  (Gastrointestinal Panel by PCR, Stool)  Once,   R     10/23/2017 1732   10/16/2017 1359  Urinalysis, Routine w reflex microscopic  STAT,   STAT     11/02/2017 1359   10/14/2017 1359  Urine culture  STAT,   STAT     10/29/2017 1359   10/16/2017 1359  Blood Cultures (routine x 2)  BLOOD CULTURE X 2,   STAT     11/02/2017 1359   10/31/2017 1358  Urine rapid drug screen (hosp performed)  STAT,   STAT     10/25/2017 1359      Vitals/Pain Today's Vitals   10/19/2017 1700 11/09/2017 1900 10/21/2017 2130 10/31/2017 2300  BP: (!) 151/119 (!) 152/103 (!) 177/98 (!) 171/101  Pulse:  (!) 125 96 95  Resp: (!) 25 (!) 54 (!) 30 (!) 24  Temp:      TempSrc:  SpO2:  100% 100% 100%    Isolation Precautions Enteric precautions (UV disinfection)  Medications Medications  sodium chloride 0.9 % bolus 1,000 mL (0 mLs Intravenous Stopped 11/08/2017 1649)    And  0.9 %  sodium chloride infusion ( Intravenous Rate/Dose Change 10/28/2017 1906)  potassium chloride 10 mEq in 100 mL IVPB (10 mEq Intravenous New Bag/Given 10/19/2017 2354)   thiamine (VITAMIN B-1) tablet 100 mg (100 mg Oral Given 11/02/2017 2345)  vancomycin (VANCOCIN) 2,000 mg in sodium chloride 0.9 % 500 mL IVPB (2,000 mg Intravenous New Bag/Given 10/27/17 0001)  piperacillin-tazobactam (ZOSYN) IVPB 2.25 g (not administered)  0.9 % NaCl with KCl 20 mEq/ L  infusion (not administered)  LORazepam (ATIVAN) injection 1-2 mg (not administered)  nicotine (NICODERM CQ - dosed in mg/24 hours) patch 14 mg (not administered)  levalbuterol (XOPENEX) nebulizer solution 0.63 mg (0.63 mg Nebulization Given 10/27/2017 2027)  sodium chloride 0.9 % bolus 1,000 mL (1,000 mLs Intravenous New Bag/Given 10/27/2017 1603)  sodium chloride 0.9 % bolus 1,000 mL (1,000 mLs Intravenous New Bag/Given 10/21/2017 1600)  potassium chloride 10 mEq in 100 mL IVPB (0 mEq Intravenous Stopped 10/28/2017 2111)  piperacillin-tazobactam (ZOSYN) IVPB 3.375 g (0 g Intravenous Stopped 11/04/2017 1742)  potassium chloride SA (K-DUR,KLOR-CON) CR tablet 40 mEq (40 mEq Oral Given 10/23/2017 2345)    Mobility non-ambulatory

## 2017-10-27 NOTE — Progress Notes (Signed)
CRITICAL VALUE ALERT  Critical Value:  Lactic Acid 2.6  Date & Time Notied:  10/27/2017 @ 0203  Provider Notified: MD Deterding  Orders Received/Actions taken: No orders received. Will continue to monitor

## 2017-10-27 NOTE — Consult Note (Signed)
PULMONARY / CRITICAL CARE MEDICINE   Name: Brooke Garcia MRN: [id number] DOB: 03/08/1956    ADMISSION DATE:  10/17/2017 CONSULTATION DATE:  10/24/2017  REFERRING MD:  hospitlaist  CHIEF COMPLAINT:  Patient found in hotel room on the ground semi-conscious in her own urien and feces. Family had not heard from her in a few days  HISTORY OF PRESENT ILLNESS:   The patient has been staying by herself ion a motel room. Her family had not heard from her in a few days.  The police were called and the patient was found on the ground semi-conscious in urine and feces. The patient said she vomited during the night as well. We are not completley sure how she had been in the past few days. The patient is encephalopathic and cannot give a very good history presently. She will answer most simple questions.  According to the family the patient is a daily drinker. She does smoke  And has been a heavy smoker for over 40 years. There is no known history of kidney problems in thepast. According to her family the patient had a CT scan of the chest at another facility about 2 weeks ago showing a thoracic aneurysym which was fairly large. She is not a diabetic. We do not have a complete list of medications and the family is unaware of most of her med. She apprently uses a [facility] store in [place]. The patient was found to have an elevated lactate >5, potassium of 2.8 and creatinine of 4.9 on presentation Her02 saturation was normal earlier. She does appear short of breath and tremulous.  2/14:: the patient is cogent and no longer shakey.She is a little unclear about time and place. She is not tremulous as she had been. The patient had been takijng OxyIR 30 mg at home at last 3-4 x a day and Xanax 1mg. She was on an anticoagualant which we believe was Xarelto.  PAST MEDICAL HISTORY :  She  has a past medical history of Acute venous embolism and thrombosis of unspecified deep vessels of lower extremity,  Anxiety state, unspecified, Arthritis, Blood dyscrasia, Calculus of kidney, Colitis, Complete rupture of rotator cuff, COPD (chronic obstructive pulmonary disease) (HCC), Degeneration of intervertebral disc, site unspecified, Depression, DVT (deep venous thrombosis) (HCC), Family history of ischemic heart disease, History of gallstones, HLD (hyperlipidemia), HTN (hypertension), Other and unspecified noninfectious gastroenteritis and colitis(558.9), Painful respiration, Primary hypercoagulable state (HCC), Protein S deficiency (HCC), Renal failure, Tobacco use disorder, Unspecified urinary incontinence, and Venous stasis ulcers (HCC).  PAST SURGICAL HISTORY: She  has a past surgical history that includes Thrombectomy / embolectomy femoral artery; Nasal sinus surgery; Tubal ligation; Rotator cuff repair; tubal ligation reversal; Colonoscopy (04/27/2012); Endovenous ablation saphenous vein w/ laser (Left, 07-04-2014); and Endovenous ablation saphenous vein w/ laser (Right, 07-17-2014).  Allergies  Allergen Reactions  . Butalbital-Apap-Caffeine Hives  . Naproxen Hives  . Tylenol [Acetaminophen] Other (See Comments)    Upset stomach    No current facility-administered medications on file prior to encounter.    Current Outpatient Medications on File Prior to Encounter  Medication Sig  . ALPRAZolam (XANAX) 1 MG tablet Take 1 tablet (1 mg total) by mouth 2 (two) times daily as needed for anxiety.  . arformoterol (BROVANA) 15 MCG/2ML NEBU Take 2 mLs (15 mcg total) by nebulization 2 (two) times daily.  . chlorthalidone (HYGROTON) 25 MG tablet TAKE 1 TABLET(25 MG) BY MOUTH DAILY  . Glycopyrrolate (LONHALA MAGNAIR REFILL KIT) 25 MCG/ML SOLN   Inhale 1 Act into the lungs 2 (two) times daily.  . irbesartan (AVAPRO) 300 MG tablet TAKE 1 TABLET(300 MG) BY MOUTH DAILY  . LONHALA MAGNAIR STARTER KIT 25 MCG/ML SOLN Use one vial via Magnair device twice a day. Generic: Glycopyrrolate  . oxyCODONE (ROXICODONE) 15 MG  immediate release tablet Take 1 tablet (15 mg total) by mouth every 4 (four) hours as needed for pain.  . pantoprazole (PROTONIX) 40 MG tablet TAKE 1 TABLET(40 MG) BY MOUTH DAILY  . Rivaroxaban (XARELTO) 15 MG TABS tablet TAKE 1 TABLET(15 MG) BY MOUTH DAILY WITH SUPPER  . thiamine (VITAMIN B-1) 100 MG tablet Take 1 tablet (100 mg total) by mouth daily.    FAMILY HISTORY:  Her indicated that her mother is deceased. She indicated that her father is deceased. She indicated that only one of her two sisters is alive. She indicated that only one of her three brothers is alive. She indicated that the status of her maternal grandmother is unknown. She indicated that the status of her maternal grandfather is unknown. She indicated that the status of her paternal grandmother is unknown. She indicated that the status of her neg hx is unknown.   SOCIAL HISTORY: She  reports that she has been smoking cigarettes.  She has a 15.00 pack-year smoking history. she has never used smokeless tobacco. She reports that she does not drink alcohol or use drugs.    SUBJECTIVE:   The [patient feels a bit better today. She remains a bit anxious VITAL SIGNS: BP (!) 153/134   Pulse 92   Temp 98.8 F (37.1 C) (Axillary)   Resp (!) 30   SpO2 97%          INTAKE / OUTPUT: No intake/output data recorded.  PHYSICAL EXAMINATION: General:  Woman looks acutely and chronically ill. Neuro:  Awake  And answers simple questions. She si tremulous. She is moving all her extremities. When she speaks her speech is fairly fluent. She is oriented to perso but does not appear oriented to place and time HEENT:  Unremarkable, no sign of major trauma, PERRLA, EOMI Cardiovascular:  RRR, S1S2 Lungs:  bilateral Bs, mild expiratory wheeze Abdomen:  Soft, BS+,non-tender, no gorss organomegaly Musculoskeletal:  Moves her extremities, has some lower back pain Skin:  Redness in the perirectal area Uro.GYN: Patient apparently has a  history of both bladder and uterine prolapse but it is not obvious at this time  LABS:  BMET Recent Labs  Lab 10/30/2017 1429 11/04/2017 1451 10/27/17 0345  NA 144 144 145  K 2.9* 2.8* 3.4*  CL 100* 103 111  CO2 18*  --  15*  BUN 58* 51* 62*  CREATININE 5.10* 4.90* 5.07*  GLUCOSE 138* 126* 111*    Electrolytes Recent Labs  Lab 10/14/2017 1429 10/27/17 0345  CALCIUM 9.8 8.0*  MG  --  1.9  PHOS  --  4.2    CBC Recent Labs  Lab 10/16/2017 1429 10/31/2017 1451 10/27/17 0816  WBC 30.5*  --  23.4*  HGB 18.1* 18.0* 16.2*  HCT 48.4* 53.0* 44.8  PLT 266  --  174    Coag's Recent Labs  Lab 10/18/2017 1429  INR 1.09    Sepsis Markers Recent Labs  Lab 10/29/2017 1736 10/27/17 0050 10/27/17 0345  LATICACIDVEN 3.81* 2.6* 2.2*    ABG Recent Labs  Lab 11/06/2017 1430 10/29/2017 1920  PHART 7.476* 7.421  PCO2ART below reportable range 21.6*  PO2ART 75.8* 85.7    Liver Enzymes Recent   Labs  Lab 10/15/2017 1429 10/27/17 0345  AST 245* 231*  ALT 97* 91*  ALKPHOS 97 69  BILITOT 1.6* 1.5*  ALBUMIN 4.1 3.0*    Cardiac Enzymes No results for input(s): TROPONINI, PROBNP in the last 168 hours.  Glucose Recent Labs  Lab 10/22/2017 1435  GLUCAP 132*    Imaging Dg Chest 2 View  Result Date: 10/25/2017 CLINICAL DATA:  Fall. EXAM: CHEST  2 VIEW COMPARISON:  CT chest dated October 05, 2017. Chest x-ray dated May 16, 2015. FINDINGS: Stable cardiomediastinal silhouette. Normal pulmonary vascularity. Atherosclerotic calcification of the aortic arch. No focal consolidation, pleural effusion, or pneumothorax. No acute osseous abnormality. IMPRESSION: No active cardiopulmonary disease. Electronically Signed   By: Titus Dubin M.D.   On: 10/18/2017 16:25   Dg Pelvis 1-2 Views  Result Date: 10/25/2017 CLINICAL DATA:  Recent fall EXAM: PELVIS - 1-2 VIEW COMPARISON:  None. FINDINGS: Pelvic ring is intact. No acute fracture or dislocation is seen. Degenerative changes of lumbar  spine are noted. No soft tissue abnormality is noted. IMPRESSION: No acute abnormality seen. Electronically Signed   By: Inez Catalina M.D.   On: 10/25/2017 16:24   Ct Head Wo Contrast  Result Date: 11/03/2017 CLINICAL DATA:  Altered mental status and recent fall EXAM: CT HEAD WITHOUT CONTRAST CT CERVICAL SPINE WITHOUT CONTRAST TECHNIQUE: Multidetector CT imaging of the head and cervical spine was performed following the standard protocol without intravenous contrast. Multiplanar CT image reconstructions of the cervical spine were also generated. COMPARISON:  None. FINDINGS: CT HEAD FINDINGS Brain: No evidence of acute infarction, hemorrhage, hydrocephalus, extra-axial collection or mass lesion/mass effect. Vascular: No hyperdense vessel or unexpected calcification. Skull: Normal. Negative for fracture or focal lesion. Sinuses/Orbits: No acute finding. Other: None. CT CERVICAL SPINE FINDINGS Alignment: Within normal limits. Skull base and vertebrae: 7 cervical segments are well visualized. Vertebral body height is well maintained. Facet hypertrophic changes are noted throughout the cervical spine. No acute fracture or acute facet abnormality is noted. Osteophytic changes are noted most marked at C5-6 and C6-7. Soft tissues and spinal canal: Vascular calcifications are noted. No acute soft tissue abnormality is noted. Upper chest: Visualized upper chest is unremarkable. Other: None IMPRESSION: CT of the head: No acute intracranial abnormality noted. CT of the cervical spine: Multilevel degenerative change without acute abnormality. Electronically Signed   By: Inez Catalina M.D.   On: 10/25/2017 16:21   Ct Cervical Spine Wo Contrast  Result Date: 10/21/2017 CLINICAL DATA:  Altered mental status and recent fall EXAM: CT HEAD WITHOUT CONTRAST CT CERVICAL SPINE WITHOUT CONTRAST TECHNIQUE: Multidetector CT imaging of the head and cervical spine was performed following the standard protocol without intravenous  contrast. Multiplanar CT image reconstructions of the cervical spine were also generated. COMPARISON:  None. FINDINGS: CT HEAD FINDINGS Brain: No evidence of acute infarction, hemorrhage, hydrocephalus, extra-axial collection or mass lesion/mass effect. Vascular: No hyperdense vessel or unexpected calcification. Skull: Normal. Negative for fracture or focal lesion. Sinuses/Orbits: No acute finding. Other: None. CT CERVICAL SPINE FINDINGS Alignment: Within normal limits. Skull base and vertebrae: 7 cervical segments are well visualized. Vertebral body height is well maintained. Facet hypertrophic changes are noted throughout the cervical spine. No acute fracture or acute facet abnormality is noted. Osteophytic changes are noted most marked at C5-6 and C6-7. Soft tissues and spinal canal: Vascular calcifications are noted. No acute soft tissue abnormality is noted. Upper chest: Visualized upper chest is unremarkable. Other: None IMPRESSION: CT of the head: No acute  intracranial abnormality noted. CT of the cervical spine: Multilevel degenerative change without acute abnormality. Electronically Signed   By: Mark  Lukens M.D.   On: 10/16/2017 16:21      ANTIBIOTICS: Patient given 1 dose of vanco and startedd on Zosyn as well.     LINES/TUBES:  peripheral Ivs.  Patient too get a rectal tube.     ASSESSMENT / PLAN: Encephalopathy The patient was found semi-conscious. She is tremulous and disoriented. Her WBC is elevated. The patient hasd been fully cultured. Her CXR does not show an acute infiltrate. Her BUN is >50 and Creat close to 5. There may be an element of uremia. The patient is a daily drinker according to the family and may be experiencing some Etoh withdrawal. Has not had seizures or overt alcohol withdrawal in the past.  2/14:: More awake and cogent. Still not oriented to time and place. Can make her needs known. Speech fluent and appropriate. Npo sign of as major withdrawal syndrome at  this point.  AKI The patient has AKI with creatinine of 4.9 and BUN of 51. We are going to be checking her CPK to r/o rhabdomyolysis. She is getting cautioulslyy hydrated presently and had 2-3 liters already in the ED. I have request a nephrology consult. I don't believe there is an emergent need for RRT  at this time. 2/14: Her creatinine is about the same at 5. Unfortunately, despite orders to place a foley it was not done so we not have sense of I's and O's.Now that potassium is better I placed the patient  On a biocarb drip for her rhabomyolysis.Renal to see her  Hemodynamics Patient  Maintaining her BP without pressors and hear rate 80-90/  SIRS The patient has an elevated WBC. She may have a UTI but urinanalysis is not done yet. No clear signof pneumonia. No obvious skin breakdown. The patient  has diarrhea. Gastrointestinal panel and stool c. Diff requested  Hypokalemia Potassium is being replaced  Alcohol withdrawal? Patient placed on CIWA protocol  Hypercoagulable state Patient has a history of a Protein s deficiency and has been on anticoagulation in recent past. Given her acute illness and acute renal dysfn. The oatient may need to be on IV heparin presently. Lovenox would not be a great choiice and shourld probably hold on Xarelto.        Robert Rosenblatt MD Pulmonary and Critical Care Medicine [facility] Pager: [phone]  10/27/2017, 8:57 AM  

## 2017-10-27 NOTE — Consult Note (Signed)
Referring Provider: No ref. provider found Primary Care Physician:  Janith Lima, MD Primary Nephrologist:    Reason for Consultation:  Acute renal failure , Hypotension  Hypokalemia   HPI:  This is a 62 year old lady that was found in semi conscious state in her excrement by family. She resides in a motel room and was encephalopathic and hypotensive on presentation. She has a history of alcohol abuse and drinks daily. Their is the possibility of a thoracic aneurysm on report and she has a protein s deficiency and hepatitis C In Feb 2018 , her renal function was in the normal range   On presentation she was hypotensive and responded rapidly to a fluid bolus  Her creatinine was 5.1 and white blood count 30.5 Her CK 9,000  Ammonia was normal and ethanol undetectable  Troponin increased slightly 0.48  Lactate level was 2.2   Chest CT performed 6/71 showed uncomplicated aneurysm 4.2 cm  She has been started on vancomycin and zosyn  Renal function is stable but essentially unchanged this morning a foley catheter has been requested but anatomically challenging due to a prolapse uterus      Past Medical History:  Diagnosis Date  . Acute venous embolism and thrombosis of unspecified deep vessels of lower extremity   . Anxiety state, unspecified   . Arthritis   . Blood dyscrasia    protein def.  . Calculus of kidney   . Colitis   . Complete rupture of rotator cuff   . COPD (chronic obstructive pulmonary disease) ([place])   . Degeneration of intervertebral disc, site unspecified   . Depression   . DVT (deep venous thrombosis) ([place])   . Family history of ischemic heart disease   . History of gallstones   . HLD (hyperlipidemia)   . HTN (hypertension)   . Other and unspecified noninfectious gastroenteritis and colitis(558.9)   . Painful respiration   . Primary hypercoagulable state ([place])   . Protein S deficiency ([place])   . Renal failure   . Tobacco use disorder   . Unspecified  urinary incontinence   . Venous stasis ulcers ([facility])    left leg    Past Surgical History:  Procedure Laterality Date  . COLONOSCOPY  04/27/2012   Procedure: COLONOSCOPY;  Surgeon: Jerene Bears, MD;  Location: [facility];  Service: Gastroenterology;  Laterality: N/A;  . ENDOVENOUS ABLATION SAPHENOUS VEIN W/ LASER Left 07-04-2014   EVLA left greater saphenous vein by Curt Jews MD  . ENDOVENOUS ABLATION SAPHENOUS VEIN W/ LASER Right 07-17-2014   EVLA roght greater saphenous vein by Curt Jews MD  . NASAL SINUS SURGERY    . ROTATOR CUFF REPAIR     right  . THROMBECTOMY / EMBOLECTOMY FEMORAL ARTERY     DVT  . TUBAL LIGATION    . tubal ligation reversal      Prior to Admission medications   Medication Sig Start Date End Date Taking? Authorizing Provider  ALPRAZolam Duanne Moron) 1 MG tablet Take 1 tablet (1 mg total) by mouth 2 (two) times daily as needed for anxiety. 06/09/17   Janith Lima, MD  arformoterol (BROVANA) 15 MCG/2ML NEBU Take 2 mLs (15 mcg total) by nebulization 2 (two) times daily. 09/08/17   Janith Lima, MD  chlorthalidone (HYGROTON) 25 MG tablet TAKE 1 TABLET(25 MG) BY MOUTH DAILY 07/20/17   Janith Lima, MD  Glycopyrrolate [facility] REFILL KIT) 25 MCG/ML SOLN Inhale 1 Act into the lungs 2 (two)  times daily. 09/08/17   Janith Lima, MD  irbesartan (AVAPRO) 300 MG tablet TAKE 1 TABLET(300 MG) BY MOUTH DAILY 09/08/17   Janith Lima, MD  [facility] MAGNAIR STARTER KIT 25 MCG/ML SOLN Use one vial via Magnair device twice a day. Generic: Glycopyrrolate 09/22/17   Janith Lima, MD  oxyCODONE (ROXICODONE) 15 MG immediate release tablet Take 1 tablet (15 mg total) by mouth every 4 (four) hours as needed for pain. 10/06/17   Janith Lima, MD  pantoprazole (PROTONIX) 40 MG tablet TAKE 1 TABLET(40 MG) BY MOUTH DAILY 06/09/17   Janith Lima, MD  Rivaroxaban (XARELTO) 15 MG TABS tablet TAKE 1 TABLET(15 MG) BY MOUTH DAILY WITH SUPPER 06/09/17   Janith Lima, MD   thiamine (VITAMIN B-1) 100 MG tablet Take 1 tablet (100 mg total) by mouth daily. 09/12/17   Janith Lima, MD    Current Facility-Administered Medications  Medication Dose Route Frequency Provider Last Rate Last Dose  . fentaNYL (SUBLIMAZE) injection 50 mcg  50 mcg Intravenous Once Deterding, Guadelupe Sabin, MD      . heparin ADULT infusion 100 units/mL (25000 units/221m sodium chloride 0.45%)  1,200 Units/hr Intravenous Continuous WEmiliano Dyer RPH 12 mL/hr at 10/27/17 1011 1,200 Units/hr at 10/27/17 1011  . iopamidol (ISOVUE-300) 61 % injection 15 mL  15 mL Oral BID PRN AJodie Echevaria Amrit, MD   15 mL at 10/27/17 1011  . levalbuterol (XOPENEX) nebulizer solution 0.63 mg  0.63 mg Nebulization Q6H RTarry Kos MD   0.63 mg at 10/27/17 08295 . LORazepam (ATIVAN) injection 1-2 mg  1-2 mg Intravenous Q3H PRN RTarry Kos MD      . nicotine (NICODERM CQ - dosed in mg/24 hours) patch 14 mg  14 mg Transdermal Daily RTarry Kos MD   14 mg at 10/27/17 1024  . piperacillin-tazobactam (ZOSYN) IVPB 2.25 g  2.25 g Intravenous Q6H ZBerton Mount RPH   Stopped at 10/27/17 06213 . sodium bicarbonate 150 mEq in sterile water 1,000 mL infusion   Intravenous Continuous RTarry Kos MD 100 mL/hr at 10/27/17 1014    . thiamine (VITAMIN B-1) tablet 100 mg  100 mg Oral Daily AJodie Echevaria Amrit, MD   100 mg at 10/27/17 1023    Allergies as of 10/21/2017 - Review Complete 11/09/2017  Allergen Reaction Noted  . Butalbital-apap-caffeine Hives 04/30/2010  . Naproxen Hives   . Tylenol [acetaminophen] Other (See Comments) 08/10/2011    Family History  Problem Relation Age of Onset  . Breast cancer Mother        mets to liver  . Stroke Mother   . Heart attack Father   . Lung cancer Father   . Other Father        Protein S Deficiency  . Clotting disorder Father   . Diabetes Father   . Heart disease Father   . Cancer Father        Lung  . Hypertension Maternal  Grandmother   . Colon cancer Maternal Grandmother   . Heart disease Maternal Grandmother   . Heart attack Paternal Grandmother   . Tuberculosis Paternal Grandmother   . Diabetes Sister   . Alzheimer's disease Maternal Grandfather   . Hypertension Brother   . Ulcers Brother   . Stomach cancer Neg Hx     Social History   Socioeconomic History  . Marital status: Single    Spouse name: Not on file  . Number  of children: 2  . Years of education: 12+  . Highest education level: Not on file  Social Needs  . Financial resource strain: Not on file  . Food insecurity - worry: Not on file  . Food insecurity - inability: Not on file  . Transportation needs - medical: Not on file  . Transportation needs - non-medical: Not on file  Occupational History  . Occupation: real estate, Advertising account executive, [employer]  Tobacco Use  . Smoking status: Current Every Day Smoker    Packs/day: 0.50    Years: 30.00    Pack years: 15.00    Types: Cigarettes  . Smokeless tobacco: Never Used  . Tobacco comment: 0.5 packs per day  Substance and Sexual Activity  . Alcohol use: No  . Drug use: No  . Sexual activity: Not Currently    Birth control/protection: Post-menopausal  Other Topics Concern  . Not on file  Social History Narrative   Married.   Occupation: currently disabled    Review of Systems: Gen: Denies any fever, chills, sweats, anorexia, fatigue, weakness, malaise, weight loss, and sleep disorder HEENT: No visual complaints, No history of Retinopathy. Normal external appearance No Epistaxis or Sore throat. No sinusitis.   CV: Denies chest pain, angina, palpitations, syncope, orthopnea, PND, peripheral edema, and claudication. Resp: Denies dyspnea at rest, dyspnea with exercise, cough, sputum, wheezing, coughing up blood, and pleurisy. GI: Some loose stools  GU : Denies urinary burning, blood in urine, urinary frequency, urinary hesitancy, nocturnal urination, and urinary incontinence.  No renal  calculi. MS: Denies joint pain, limitation of movement, and swelling, stiffness, low back pain, extremity pain. Denies muscle weakness, cramps, atrophy.  No use of non steroidal antiinflammatory drugs. Derm: Denies rash, itching, dry skin, hives, moles, warts, or unhealing ulcers.  Psych: Denies depression, anxiety, memory loss, suicidal ideation, hallucinations, paranoia, and confusion. Heme:  History of factor protein s deficiency Neuro: No headache.  No diplopia. No dysarthria.  No dysphasia.  No history of CVA.  No Seizures. No paresthesias.  No weakness. Endocrine No DM.  No Thyroid disease.  No Adrenal disease.  Physical Exam: Vital signs in last 24 hours: Temp:  [98.7 F (37.1 C)-98.8 F (37.1 C)] 98.8 F (37.1 C) (02/14 0708) Pulse Rate:  [47-125] 92 (02/14 0629) Resp:  [24-54] 30 (02/14 0629) BP: (106-190)/(49-134) 153/134 (02/14 0629) SpO2:  [82 %-100 %] 97 % (02/14 0629) Last BM Date: 10/27/17 General:    Ill appearing lady non distressed  Rather disheveled appearance and a little inappropriate in responding to questions Head:  Normocephalic and atraumatic. Eyes:  Sclera clear, no icterus.   Conjunctiva pink. Ears:  Normal auditory acuity. Nose:  No deformity, discharge,  or lesions. Mouth:  No deformity or lesions, dentition normal. Neck:  Supple; no masses or thyromegaly. JVP not elevated Lungs:  Clear throughout to auscultation.   No wheezes, crackles, or rhonchi. No acute distress. Heart:  Regular rate and rhythm; no murmurs, clicks, rubs,  or gallops. Abdomen:  Soft, nontender and nondistended. No masses, hepatosplenomegaly or hernias noted. Normal bowel sounds, without guarding, and without rebound.   Msk:  Symmetrical without gross deformities. Normal posture. Pulses:  No carotid, renal, femoral bruits. DP and PT symmetrical and equal Extremities:  Without clubbing or edema. Neurologic:  Alert and  oriented x4;  grossly normal neurologically.   Intake/Output from  previous day: No intake/output data recorded. Intake/Output this shift: No intake/output data recorded.  Lab Results: Recent Labs    11/06/2017  1429 11/03/2017 1451 10/27/17 0816  WBC 30.5*  --  23.4*  HGB 18.1* 18.0* 16.2*  HCT 48.4* 53.0* 44.8  PLT 266  --  174   BMET Recent Labs    10/23/2017 1429 10/15/2017 1451 10/27/17 0345  NA 144 144 145  K 2.9* 2.8* 3.4*  CL 100* 103 111  CO2 18*  --  15*  GLUCOSE 138* 126* 111*  BUN 58* 51* 62*  CREATININE 5.10* 4.90* 5.07*  CALCIUM 9.8  --  8.0*  PHOS  --   --  4.2   LFT Recent Labs    10/27/17 0345  PROT 6.9  ALBUMIN 3.0*  AST 231*  ALT 91*  ALKPHOS 69  BILITOT 1.5*   PT/INR Recent Labs    10/24/2017 1429  LABPROT 14.0  INR 1.09   Hepatitis Panel No results for input(s): HEPBSAG, HCVAB, HEPAIGM, HEPBIGM in the last 72 hours.  Studies/Results: Dg Chest 2 View  Result Date: 10/27/2017 CLINICAL DATA:  Fall. EXAM: CHEST  2 VIEW COMPARISON:  CT chest dated October 05, 2017. Chest x-ray dated May 16, 2015. FINDINGS: Stable cardiomediastinal silhouette. Normal pulmonary vascularity. Atherosclerotic calcification of the aortic arch. No focal consolidation, pleural effusion, or pneumothorax. No acute osseous abnormality. IMPRESSION: No active cardiopulmonary disease. Electronically Signed   By: Titus Dubin M.D.   On: 11/04/2017 16:25   Dg Pelvis 1-2 Views  Result Date: 10/20/2017 CLINICAL DATA:  Recent fall EXAM: PELVIS - 1-2 VIEW COMPARISON:  None. FINDINGS: Pelvic ring is intact. No acute fracture or dislocation is seen. Degenerative changes of lumbar spine are noted. No soft tissue abnormality is noted. IMPRESSION: No acute abnormality seen. Electronically Signed   By: Inez Catalina M.D.   On: 10/15/2017 16:24   Ct Head Wo Contrast  Result Date: 11/02/2017 CLINICAL DATA:  Altered mental status and recent fall EXAM: CT HEAD WITHOUT CONTRAST CT CERVICAL SPINE WITHOUT CONTRAST TECHNIQUE: Multidetector CT imaging of the  head and cervical spine was performed following the standard protocol without intravenous contrast. Multiplanar CT image reconstructions of the cervical spine were also generated. COMPARISON:  None. FINDINGS: CT HEAD FINDINGS Brain: No evidence of acute infarction, hemorrhage, hydrocephalus, extra-axial collection or mass lesion/mass effect. Vascular: No hyperdense vessel or unexpected calcification. Skull: Normal. Negative for fracture or focal lesion. Sinuses/Orbits: No acute finding. Other: None. CT CERVICAL SPINE FINDINGS Alignment: Within normal limits. Skull base and vertebrae: 7 cervical segments are well visualized. Vertebral body height is well maintained. Facet hypertrophic changes are noted throughout the cervical spine. No acute fracture or acute facet abnormality is noted. Osteophytic changes are noted most marked at C5-6 and C6-7. Soft tissues and spinal canal: Vascular calcifications are noted. No acute soft tissue abnormality is noted. Upper chest: Visualized upper chest is unremarkable. Other: None IMPRESSION: CT of the head: No acute intracranial abnormality noted. CT of the cervical spine: Multilevel degenerative change without acute abnormality. Electronically Signed   By: Inez Catalina M.D.   On: 10/23/2017 16:21   Ct Cervical Spine Wo Contrast  Result Date: 10/21/2017 CLINICAL DATA:  Altered mental status and recent fall EXAM: CT HEAD WITHOUT CONTRAST CT CERVICAL SPINE WITHOUT CONTRAST TECHNIQUE: Multidetector CT imaging of the head and cervical spine was performed following the standard protocol without intravenous contrast. Multiplanar CT image reconstructions of the cervical spine were also generated. COMPARISON:  None. FINDINGS: CT HEAD FINDINGS Brain: No evidence of acute infarction, hemorrhage, hydrocephalus, extra-axial collection or mass lesion/mass effect. Vascular: No hyperdense vessel or  unexpected calcification. Skull: Normal. Negative for fracture or focal lesion.  Sinuses/Orbits: No acute finding. Other: None. CT CERVICAL SPINE FINDINGS Alignment: Within normal limits. Skull base and vertebrae: 7 cervical segments are well visualized. Vertebral body height is well maintained. Facet hypertrophic changes are noted throughout the cervical spine. No acute fracture or acute facet abnormality is noted. Osteophytic changes are noted most marked at C5-6 and C6-7. Soft tissues and spinal canal: Vascular calcifications are noted. No acute soft tissue abnormality is noted. Upper chest: Visualized upper chest is unremarkable. Other: None IMPRESSION: CT of the head: No acute intracranial abnormality noted. CT of the cervical spine: Multilevel degenerative change without acute abnormality. Electronically Signed   By: Inez Catalina M.D.   On: 11/10/2017 16:21    Assessment/Plan:  Acute renal failure  The etiology is a little puzzling but could be explained by some hypotension and ischemic ATN  A foley catheter would be helpful and the CT that has already been ordered could be useful to evaluate hydronephrosis. I will obtain a urine to see if an AGN or AIN is present although the history would be atypical. She may have some underlying cirrhosis from her hepatitis and this could raise the specter of hepatorenal syndrome   Volume appears to be euvolemic at present and is being given IV fluids  She has a mild metabolic acidosis and is receiving IV bicarbonate at this time  Hypokalemia  This is being repleted  Thank you for this interesting consult and I will be happy to continue to follow with you    LOS: 1 Angelicia Lessner W     [phone] _0 _1 :32 AM

## 2017-10-27 NOTE — Progress Notes (Signed)
Pharmacy Antibiotic Note  Brooke Garcia is a 62 y.o. female admitted on 10/27/2017 with sepsis. Patient found on floor covered in stool and urine. Pharmacy has been consulted for vancomycin and zosyn dosing.  Today, 10/27/2017  AKI noted  + leukocytosis  Lactate elevated, improving  Afebrile, + hypothermia  Vancomycin random level = 53 but drawn only 4hr after 2g dose was given  Plan:  Continue to hold vancomycin  Recheck level in am  Start maintenance dose when Renal fx stable  Zosyn 2.25gm IV q6h based on current renal function  Daily SCr  Await culture results to de-escalate     Temp (24hrs), Avg:98.8 F (37.1 C), Min:98.7 F (37.1 C), Max:98.8 F (37.1 C)  Recent Labs  Lab 11/09/2017 1429 10/31/2017 1451 11/05/2017 1736 10/27/17 0050 10/27/17 0345  WBC 30.5*  --   --   --   --   CREATININE 5.10* 4.90*  --   --  5.07*  LATICACIDVEN  --  5.79* 3.81* 2.6* 2.2*  VANCORANDOM  --   --   --   --  53*    CrCl cannot be calculated (Unknown ideal weight.).    Allergies  Allergen Reactions  . Butalbital-Apap-Caffeine Hives  . Naproxen Hives  . Tylenol [Acetaminophen] Other (See Comments)    Upset stomach    Antimicrobials this admission: 2/13 vancomycin >> 2/13 zosyn >>   Dose adjustments this admission: 2/14 VR = 53 (~4hr after 2g dose), SCr up to 5 so will recheck VR 2/15 AM 2/15 VR = __   Microbiology results: 2/13 C. Difficile: neg 2/13 GI panel: 2/13 BCx:  2/13 UCx:    Thank you for allowing pharmacy to be a part of this patient's care.  Peggyann Juba, PharmD, BCPS Pager: [phone] 10/27/2017 8:00 AM

## 2017-10-27 NOTE — Consult Note (Signed)
Urology Consult   Physician requesting consult: Adhikari Bk, Amrit  Reason for consult: Difficult foley placement  History of Present Illness: Brooke Garcia is a 62 y.o. female with PMH significant for ETOH abuse, PE, protein S deficiency, COPD, HTN, CKD, hepatitis C, and thoraco-abdominal aneurysm who was recently admitted for tx of sepsis, altered mental status, acute injury on CKD, and diarrhea.  Nursing was unable to place a foley and request was made for urology assistance.  Pt is pleasant and resting.  She is confused and cannot provide a reliable hx.  She says she has not voided in several days.  She states she saw a urologist "years ago" but does not remember who that was.  Review of our records show that she was just evaled by Dr. Matilde Sprang on 10/05/17 for a grade 3 cystocele.  Urodynamics show she empties efficiently and does not leak.   Past Medical History:  Diagnosis Date  . Acute venous embolism and thrombosis of unspecified deep vessels of lower extremity   . Anxiety state, unspecified   . Arthritis   . Blood dyscrasia    protein def.  . Calculus of kidney   . Colitis   . Complete rupture of rotator cuff   . COPD (chronic obstructive pulmonary disease) ([place])   . Degeneration of intervertebral disc, site unspecified   . Depression   . DVT (deep venous thrombosis) ([place])   . Family history of ischemic heart disease   . History of gallstones   . HLD (hyperlipidemia)   . HTN (hypertension)   . Other and unspecified noninfectious gastroenteritis and colitis(558.9)   . Painful respiration   . Primary hypercoagulable state ([place])   . Protein S deficiency ([place])   . Renal failure   . Tobacco use disorder   . Unspecified urinary incontinence   . Venous stasis ulcers ([facility])    left leg    Past Surgical History:  Procedure Laterality Date  . COLONOSCOPY  04/27/2012   Procedure: COLONOSCOPY;  Surgeon: Jerene Bears, MD;  Location: [facility];  Service: Gastroenterology;   Laterality: N/A;  . ENDOVENOUS ABLATION SAPHENOUS VEIN W/ LASER Left 07-04-2014   EVLA left greater saphenous vein by Curt Jews MD  . ENDOVENOUS ABLATION SAPHENOUS VEIN W/ LASER Right 07-17-2014   EVLA roght greater saphenous vein by Curt Jews MD  . NASAL SINUS SURGERY    . ROTATOR CUFF REPAIR     right  . THROMBECTOMY / EMBOLECTOMY FEMORAL ARTERY     DVT  . TUBAL LIGATION    . tubal ligation reversal       Current Hospital Medications:  Home meds:  No outpatient medications have been marked as taking for the 11/08/2017 encounter [facility] Encounter).     Scheduled Meds: . fentaNYL (SUBLIMAZE) injection  50 mcg Intravenous Once  . levalbuterol  0.63 mg Nebulization Q6H  . nicotine  14 mg Transdermal Daily  . thiamine  100 mg Oral Daily   Continuous Infusions: . heparin 1,200 Units/hr (10/27/17 1011)  . piperacillin-tazobactam (ZOSYN)  IV Stopped (10/27/17 4193)  .  sodium bicarbonate (isotonic) infusion in sterile water 100 mL/hr at 10/27/17 1014   PRN Meds:.iopamidol, LORazepam  Allergies:  Allergies  Allergen Reactions  . Butalbital-Apap-Caffeine Hives  . Naproxen Hives  . Tylenol [Acetaminophen] Other (See Comments)    Upset stomach    Family History  Problem Relation Age of Onset  . Breast cancer Mother        mets to  liver  . Stroke Mother   . Heart attack Father   . Lung cancer Father   . Other Father        Protein S Deficiency  . Clotting disorder Father   . Diabetes Father   . Heart disease Father   . Cancer Father        Lung  . Hypertension Maternal Grandmother   . Colon cancer Maternal Grandmother   . Heart disease Maternal Grandmother   . Heart attack Paternal Grandmother   . Tuberculosis Paternal Grandmother   . Diabetes Sister   . Alzheimer's disease Maternal Grandfather   . Hypertension Brother   . Ulcers Brother   . Stomach cancer Neg Hx     Social History:  reports that she has been smoking cigarettes.  She has a 15.00 pack-year  smoking history. she has never used smokeless tobacco. She reports that she does not drink alcohol or use drugs.  ROS: A complete review of systems could not be completed due to pts confusion.   Physical Exam:  Vital signs in last 24 hours: Temp:  [98 F (36.7 C)-98.8 F (37.1 C)] 98 F (36.7 C) (02/14 1200) Pulse Rate:  [47-125] 92 (02/14 0629) Resp:  [24-54] 30 (02/14 0629) BP: (106-190)/(49-134) 153/134 (02/14 0629) SpO2:  [86 %-100 %] 97 % (02/14 0629) Constitutional:  Alert and oriented to place, No acute distress Cardiovascular: Regular rate and rhythm Respiratory: Normal respiratory effort GI: Abdomen is soft, nontender, nondistended, no abdominal masses; rectal tube in place with dark green stool  GU: vaginal area erythematous with no skin breakdown; small introitus with cystocele noted; urethral not visible Lymphatic: No lymphadenopathy Neurologic: Grossly intact, no focal deficits Psychiatric: anxious  Laboratory Data:  Recent Labs    10/18/2017 1429 10/25/2017 1451 10/27/17 0816  WBC 30.5*  --  23.4*  HGB 18.1* 18.0* 16.2*  HCT 48.4* 53.0* 44.8  PLT 266  --  174    Recent Labs    10/17/2017 1429 10/24/2017 1451 10/27/17 0345  NA 144 144 145  K 2.9* 2.8* 3.4*  CL 100* 103 111  GLUCOSE 138* 126* 111*  BUN 58* 51* 62*  CALCIUM 9.8  --  8.0*  CREATININE 5.10* 4.90* 5.07*     Results for orders placed or performed during the hospital encounter of 11/09/2017 (from the past 24 hour(s))  I-stat troponin, ED     Status: Abnormal   Collection Time: 11/03/2017  5:34 PM  Result Value Ref Range   Troponin i, poc 0.48 (HH) 0.00 - 0.08 ng/mL   Comment NOTIFIED PHYSICIAN    Comment 3          I-Stat CG4 Lactic Acid, ED  (not at  [facility])     Status: Abnormal   Collection Time: 11/06/2017  5:36 PM  Result Value Ref Range   Lactic Acid, Venous 3.81 (HH) 0.5 - 1.9 mmol/L   Comment NOTIFIED PHYSICIAN   Brain natriuretic peptide     Status: Abnormal   Collection Time: 11/08/2017   5:36 PM  Result Value Ref Range   B Natriuretic Peptide 311.0 (H) 0.0 - 100.0 pg/mL  C difficile quick scan w PCR reflex     Status: None   Collection Time: 10/25/2017  7:00 PM  Result Value Ref Range   C Diff antigen NEGATIVE NEGATIVE   C Diff toxin NEGATIVE NEGATIVE   C Diff interpretation No C. difficile detected.   Blood gas, arterial     Status: Abnormal  Collection Time: 11/04/2017  7:20 PM  Result Value Ref Range   O2 Content 3.0 L/min   pH, Arterial 7.421 7.350 - 7.450   pCO2 arterial 21.6 (L) 32.0 - 48.0 mmHg   pO2, Arterial 85.7 83.0 - 108.0 mmHg   Bicarbonate 13.8 (L) 20.0 - 28.0 mmol/L   Acid-base deficit 8.3 (H) 0.0 - 2.0 mmol/L   O2 Saturation 95.5 %   Patient temperature 98.6    Collection site RIGHT RADIAL    Drawn by [phone]    Sample type ARTERIAL DRAW    Allens test (pass/fail) PASS PASS  Lactic acid, plasma     Status: Abnormal   Collection Time: 10/27/17 12:50 AM  Result Value Ref Range   Lactic Acid, Venous 2.6 (HH) 0.5 - 1.9 mmol/L  Lactic acid, plasma     Status: Abnormal   Collection Time: 10/27/17  3:45 AM  Result Value Ref Range   Lactic Acid, Venous 2.2 (HH) 0.5 - 1.9 mmol/L  CK     Status: Abnormal   Collection Time: 10/27/17  3:45 AM  Result Value Ref Range   Total CK 6,917 (H) 38 - 234 U/L  HIV antibody (Routine Testing)     Status: None   Collection Time: 10/27/17  3:45 AM  Result Value Ref Range   HIV Screen 4th Generation wRfx Non Reactive Non Reactive  Comprehensive metabolic panel     Status: Abnormal   Collection Time: 10/27/17  3:45 AM  Result Value Ref Range   Sodium 145 135 - 145 mmol/L   Potassium 3.4 (L) 3.5 - 5.1 mmol/L   Chloride 111 101 - 111 mmol/L   CO2 15 (L) 22 - 32 mmol/L   Glucose, Bld 111 (H) 65 - 99 mg/dL   BUN 62 (H) 6 - 20 mg/dL   Creatinine, Ser 5.07 (H) 0.44 - 1.00 mg/dL   Calcium 8.0 (L) 8.9 - 10.3 mg/dL   Total Protein 6.9 6.5 - 8.1 g/dL   Albumin 3.0 (L) 3.5 - 5.0 g/dL   AST 231 (H) 15 - 41 U/L   ALT 91 (H)  14 - 54 U/L   Alkaline Phosphatase 69 38 - 126 U/L   Total Bilirubin 1.5 (H) 0.3 - 1.2 mg/dL   GFR calc non Af Amer 8 (L) >60 mL/min   GFR calc Af Amer 10 (L) >60 mL/min   Anion gap 19 (H) 5 - 15  Vancomycin, random     Status: Abnormal   Collection Time: 10/27/17  3:45 AM  Result Value Ref Range   Vancomycin Rm 53 (HH)   Phosphorus     Status: None   Collection Time: 10/27/17  3:45 AM  Result Value Ref Range   Phosphorus 4.2 2.5 - 4.6 mg/dL  Magnesium     Status: None   Collection Time: 10/27/17  3:45 AM  Result Value Ref Range   Magnesium 1.9 1.7 - 2.4 mg/dL  CBC with Differential/Platelet     Status: Abnormal   Collection Time: 10/27/17  8:16 AM  Result Value Ref Range   WBC 23.4 (H) 4.0 - 10.5 K/uL   RBC 4.69 3.87 - 5.11 MIL/uL   Hemoglobin 16.2 (H) 12.0 - 15.0 g/dL   HCT 44.8 36.0 - 46.0 %   MCV 95.5 78.0 - 100.0 fL   MCH 34.5 (H) 26.0 - 34.0 pg   MCHC 36.2 (H) 30.0 - 36.0 g/dL   RDW 13.7 11.5 - 15.5 %   Platelets 174 150 -  400 K/uL   Neutrophils Relative % 84 %   Neutro Abs 19.5 (H) 1.7 - 7.7 K/uL   Lymphocytes Relative 10 %   Lymphs Abs 2.4 0.7 - 4.0 K/uL   Monocytes Relative 6 %   Monocytes Absolute 1.5 (H) 0.1 - 1.0 K/uL   Eosinophils Relative 0 %   Eosinophils Absolute 0.0 0.0 - 0.7 K/uL   Basophils Relative 0 %   Basophils Absolute 0.0 0.0 - 0.1 K/uL  Heparin level (unfractionated)     Status: Abnormal   Collection Time: 10/27/17  8:16 AM  Result Value Ref Range   Heparin Unfractionated <0.10 (L) 0.30 - 0.70 IU/mL   Recent Results (from the past 240 hour(s))  C difficile quick scan w PCR reflex     Status: None   Collection Time: 10/15/2017  7:00 PM  Result Value Ref Range Status   C Diff antigen NEGATIVE NEGATIVE Final   C Diff toxin NEGATIVE NEGATIVE Final   C Diff interpretation No C. difficile detected.  Final    Comment: Performed at [facility], [facility] [address], [place], [place] [address]    Renal Function: Recent Labs     10/18/2017 1429 10/21/2017 1451 10/27/17 0345  CREATININE 5.10* 4.90* 5.07*   CrCl cannot be calculated (Unknown ideal weight.).  Radiologic Imaging: Ct Abdomen Pelvis Wo Contrast  Result Date: 10/27/2017 CLINICAL DATA:  Found on the floor. Altered mental status, diarrhea, acute renal failure, severe leukocytosis EXAM: CT ABDOMEN AND PELVIS WITHOUT CONTRAST TECHNIQUE: Multidetector CT imaging of the abdomen and pelvis was performed following the standard protocol without IV contrast. COMPARISON:  05/04/2012 FINDINGS: Lower chest: Left lower lobe atelectasis. Calcific atherosclerotic disease of the aorta. Hepatobiliary: Normal appearance of the liver. 2.7 cm calculus within the dependent portion of the gallbladder. No secondary signs of acute cholecystitis. Pancreas: Unremarkable. No pancreatic ductal dilatation or surrounding inflammatory changes. Spleen: Normal in size without focal abnormality. Adrenals/Urinary Tract: Adrenal glands are unremarkable. Kidneys are normal, without renal calculi, focal lesion, or hydronephrosis. Bladder is unremarkable. Stomach/Bowel: Fluid-filled small and large bowel loops with upper limits of normal caliber of the small bowels. No transitional point seen. Featureless appearance of the colon usually seen with colitis. Stool retention device in the rectum. Vascular/Lymphatic: Aortic atherosclerosis. No enlarged abdominal or pelvic lymph nodes. Reproductive: Uterus and bilateral adnexa are unremarkable. Other: No abdominal wall hernia or abnormality. No abdominopelvic ascites. Musculoskeletal: No acute osseous findings. Multilevel osteoarthritic changes of the lumbosacral spine. IMPRESSION: 2.7 cm calculus within the dependent portion of the gallbladder. No CT evidence of acute cholecystitis on this motion degraded exam. Pan enterocolitis with evidence of diarrheal state. Calcific atherosclerotic disease and tortuosity of the aorta. Electronically Signed   By: Fidela Salisbury M.D.   On: 10/27/2017 14:43   Dg Chest 2 View  Result Date: 10/21/2017 CLINICAL DATA:  Fall. EXAM: CHEST  2 VIEW COMPARISON:  CT chest dated October 05, 2017. Chest x-ray dated May 16, 2015. FINDINGS: Stable cardiomediastinal silhouette. Normal pulmonary vascularity. Atherosclerotic calcification of the aortic arch. No focal consolidation, pleural effusion, or pneumothorax. No acute osseous abnormality. IMPRESSION: No active cardiopulmonary disease. Electronically Signed   By: Titus Dubin M.D.   On: 10/23/2017 16:25   Dg Pelvis 1-2 Views  Result Date: 10/25/2017 CLINICAL DATA:  Recent fall EXAM: PELVIS - 1-2 VIEW COMPARISON:  None. FINDINGS: Pelvic ring is intact. No acute fracture or dislocation is seen. Degenerative changes of lumbar spine are noted. No soft tissue  abnormality is noted. IMPRESSION: No acute abnormality seen. Electronically Signed   By: Inez Catalina M.D.   On: 11/02/2017 16:24   Ct Head Wo Contrast  Result Date: 10/31/2017 CLINICAL DATA:  Altered mental status and recent fall EXAM: CT HEAD WITHOUT CONTRAST CT CERVICAL SPINE WITHOUT CONTRAST TECHNIQUE: Multidetector CT imaging of the head and cervical spine was performed following the standard protocol without intravenous contrast. Multiplanar CT image reconstructions of the cervical spine were also generated. COMPARISON:  None. FINDINGS: CT HEAD FINDINGS Brain: No evidence of acute infarction, hemorrhage, hydrocephalus, extra-axial collection or mass lesion/mass effect. Vascular: No hyperdense vessel or unexpected calcification. Skull: Normal. Negative for fracture or focal lesion. Sinuses/Orbits: No acute finding. Other: None. CT CERVICAL SPINE FINDINGS Alignment: Within normal limits. Skull base and vertebrae: 7 cervical segments are well visualized. Vertebral body height is well maintained. Facet hypertrophic changes are noted throughout the cervical spine. No acute fracture or acute facet abnormality is noted.  Osteophytic changes are noted most marked at C5-6 and C6-7. Soft tissues and spinal canal: Vascular calcifications are noted. No acute soft tissue abnormality is noted. Upper chest: Visualized upper chest is unremarkable. Other: None IMPRESSION: CT of the head: No acute intracranial abnormality noted. CT of the cervical spine: Multilevel degenerative change without acute abnormality. Electronically Signed   By: Inez Catalina M.D.   On: 10/17/2017 16:21   Ct Cervical Spine Wo Contrast  Result Date: 10/31/2017 CLINICAL DATA:  Altered mental status and recent fall EXAM: CT HEAD WITHOUT CONTRAST CT CERVICAL SPINE WITHOUT CONTRAST TECHNIQUE: Multidetector CT imaging of the head and cervical spine was performed following the standard protocol without intravenous contrast. Multiplanar CT image reconstructions of the cervical spine were also generated. COMPARISON:  None. FINDINGS: CT HEAD FINDINGS Brain: No evidence of acute infarction, hemorrhage, hydrocephalus, extra-axial collection or mass lesion/mass effect. Vascular: No hyperdense vessel or unexpected calcification. Skull: Normal. Negative for fracture or focal lesion. Sinuses/Orbits: No acute finding. Other: None. CT CERVICAL SPINE FINDINGS Alignment: Within normal limits. Skull base and vertebrae: 7 cervical segments are well visualized. Vertebral body height is well maintained. Facet hypertrophic changes are noted throughout the cervical spine. No acute fracture or acute facet abnormality is noted. Osteophytic changes are noted most marked at C5-6 and C6-7. Soft tissues and spinal canal: Vascular calcifications are noted. No acute soft tissue abnormality is noted. Upper chest: Visualized upper chest is unremarkable. Other: None IMPRESSION: CT of the head: No acute intracranial abnormality noted. CT of the cervical spine: Multilevel degenerative change without acute abnormality. Electronically Signed   By: Inez Catalina M.D.   On: 11/10/2017 16:21   Procedure:   Area was prepped with betadine and draped in sterile fashion. 11f foley was placed easily with immediate return of approx 100cc dark urine.  Balloon was inflated with 10cc sterile water.  Leg strap was placed to secure foley.  Pt tolerated well.   Impression/Recommendation:  Cystocele--contributed to difficult foley placement.  Send urine for culture. Per urodynamics pt normally empties her bladder.  Call prn.   Debbrah Alar 10/27/2017, 3:02 PM

## 2017-10-27 NOTE — ED Notes (Signed)
ED TO INPATIENT HANDOFF REPORT  Name/Age/Gender Brooke Garcia 61 y.o. female  Code Status    Code Status Orders  (From admission, onward)        Start     Ordered   11/09/2017 1737  Full code  Continuous     10/20/2017 1737    Code Status History    Date Active Date Inactive Code Status Order ID Comments User Context   05/08/2015 17:05 05/12/2015 14:07 Full Code [id number]  Gherghe, Costin M, MD ED      Home/SNF/Other Home  Chief Complaint Hypokalemia [E87.6] Lactic acidosis [E87.2] Fall, initial encounter [W19.XXXA] Acute renal failure, unspecified acute renal failure type (HCC) [N17.9]  Level of Care/Admitting Diagnosis ED Disposition    ED Disposition Condition Comment   Admit  Hospital Area: [facility] [[id number]]  Level of Care: Stepdown [14]  Admit to SDU based on following criteria: Hemodynamic compromise or significant risk of instability:  Patient requiring short term acute titration and management of vasoactive drips, and invasive monitoring (i.e., CVP and Arterial line).  Diagnosis: Severe sepsis (HCC) [1192020]  Admitting Physician: ADHIKARI BK, AMRIT [[id number]]  Attending Physician: ADHIKARI BK, AMRIT [[id number]]  Estimated length of stay: past midnight tomorrow  Certification:: I certify this patient will need inpatient services for at least 2 midnights  PT Class (Do Not Modify): Inpatient [101]  PT Acc Code (Do Not Modify): Private [1]       Medical History Past Medical History:  Diagnosis Date  . Acute venous embolism and thrombosis of unspecified deep vessels of lower extremity   . Anxiety state, unspecified   . Arthritis   . Blood dyscrasia    protein def.  . Calculus of kidney   . Colitis   . Complete rupture of rotator cuff   . COPD (chronic obstructive pulmonary disease) (HCC)   . Degeneration of intervertebral disc, site unspecified   . Depression   . DVT (deep venous thrombosis) (HCC)   . Family history of ischemic heart  disease   . History of gallstones   . HLD (hyperlipidemia)   . HTN (hypertension)   . Other and unspecified noninfectious gastroenteritis and colitis(558.9)   . Painful respiration   . Primary hypercoagulable state (HCC)   . Protein S deficiency (HCC)   . Renal failure   . Tobacco use disorder   . Unspecified urinary incontinence   . Venous stasis ulcers (HCC)    left leg    Allergies Allergies  Allergen Reactions  . Butalbital-Apap-Caffeine Hives  . Naproxen Hives  . Tylenol [Acetaminophen] Other (See Comments)    Upset stomach    IV Location/Drains/Wounds Patient Lines/Drains/Airways Status   Active Line/Drains/Airways    Name:   Placement date:   Placement time:   Site:   Days:   Peripheral IV 10/20/2017 Left Antecubital   10/31/2017    1440    Antecubital   1   AIRWAYS   04/27/12    1226     2009   AIRWAYS   04/27/12    1226     2009   Airway   04/27/12    1229     2009   Pressure Ulcer 09/08/15 Stage III -  Full thickness tissue loss. Subcutaneous fat may be visible but bone, tendon or muscle are NOT exposed. Venus stasis ulcer that pt states has had for over 2 months and on going treatment at the wound care clinic.   09/08/15      2306     780          Labs/Imaging Results for orders placed or performed during the hospital encounter of 10/29/2017 (from the past 48 hour(s))  Comprehensive metabolic panel     Status: Abnormal   Collection Time: 10/18/2017  2:29 PM  Result Value Ref Range   Sodium 144 135 - 145 mmol/L   Potassium 2.9 (L) 3.5 - 5.1 mmol/L   Chloride 100 (L) 101 - 111 mmol/L   CO2 18 (L) 22 - 32 mmol/L   Glucose, Bld 138 (H) 65 - 99 mg/dL   BUN 58 (H) 6 - 20 mg/dL   Creatinine, Ser 5.10 (H) 0.44 - 1.00 mg/dL   Calcium 9.8 8.9 - 10.3 mg/dL   Total Protein 9.4 (H) 6.5 - 8.1 g/dL   Albumin 4.1 3.5 - 5.0 g/dL   AST 245 (H) 15 - 41 U/L   ALT 97 (H) 14 - 54 U/L   Alkaline Phosphatase 97 38 - 126 U/L   Total Bilirubin 1.6 (H) 0.3 - 1.2 mg/dL   GFR calc non Af  Amer 8 (L) >60 mL/min   GFR calc Af Amer 10 (L) >60 mL/min    Comment: (NOTE) The eGFR has been calculated using the CKD EPI equation. This calculation has not been validated in all clinical situations. eGFR's persistently <60 mL/min signify possible Chronic Kidney Disease.    Anion gap 26 (H) 5 - 15    Comment: REPEATED TO VERIFY Performed at [facility], [address] [place], [place] [address]   CBC WITH DIFFERENTIAL     Status: Abnormal   Collection Time: 10/31/2017  2:29 PM  Result Value Ref Range   WBC 30.5 (H) 4.0 - 10.5 K/uL   RBC 5.14 (H) 3.87 - 5.11 MIL/uL   Hemoglobin 18.1 (H) 12.0 - 15.0 g/dL   HCT 48.4 (H) 36.0 - 46.0 %   MCV 94.2 78.0 - 100.0 fL   MCH 35.2 (H) 26.0 - 34.0 pg   MCHC 37.4 (H) 30.0 - 36.0 g/dL    Comment: REPEATED TO VERIFY RULED OUT INTERFERING SUBSTANCES    RDW 13.4 11.5 - 15.5 %   Platelets 266 150 - 400 K/uL   Neutrophils Relative % 86 %   Lymphocytes Relative 11 %   Monocytes Relative 3 %   Eosinophils Relative 0 %   Basophils Relative 0 %   Neutro Abs 26.2 (H) 1.7 - 7.7 K/uL   Lymphs Abs 3.4 0.7 - 4.0 K/uL   Monocytes Absolute 0.9 0.1 - 1.0 K/uL   Eosinophils Absolute 0.0 0.0 - 0.7 K/uL   Basophils Absolute 0.0 0.0 - 0.1 K/uL   Smear Review MORPHOLOGY UNREMARKABLE     Comment: Performed at [facility], [address] [place], [place] [address]  Ammonia     Status: None   Collection Time: 10/28/2017  2:29 PM  Result Value Ref Range   Ammonia 17 9 - 35 umol/L    Comment: Performed at [facility], [address] [place], [place] [address]  Ethanol     Status: None   Collection Time: 10/17/2017  2:29 PM  Result Value Ref Range   Alcohol, Ethyl (B) <10 <10 mg/dL    Comment:        LOWEST DETECTABLE LIMIT FOR SERUM ALCOHOL IS 10 mg/dL FOR MEDICAL PURPOSES ONLY [facility] at  [facility], [address] [place], [place] [address]   Protime-INR       Status: None    Collection Time: 11/06/2017  2:29 PM  Result Value Ref Range   Prothrombin Time 14.0 11.4 - 15.2 seconds   INR 1.09     Comment: Performed at [facility], [place] [address], [place], [place] [address]  CK     Status: Abnormal   Collection Time: 10/29/2017  2:29 PM  Result Value Ref Range   Total CK 9,306 (H) 38 - 234 U/L    Comment: RESULTS CONFIRMED BY MANUAL DILUTION Performed at [facility], [place] [address], [place], [place] [address]   Blood gas, arterial ([facility] & [facility] ONLY)     Status: Abnormal   Collection Time: 10/30/2017  2:30 PM  Result Value Ref Range   FIO2 0.21    pH, Arterial 7.476 (H) 7.350 - 7.450   pCO2 arterial below reportable range 32.0 - 48.0 mmHg    Comment: rbv dr Gilford Raid by Eben Burow rrt rcp on 10/27/2017 at 1433   pO2, Arterial 75.8 (L) 83.0 - 108.0 mmHg   Bicarbonate unable to calculate due to low co2 20.0 - 28.0 mmol/L   Acid-Base Excess unable to calculate 0.0 - 2.0 mmol/L   Acid-base deficit unable to calculate 0.0 - 2.0 mmol/L   O2 Saturation 94.0 %   Patient temperature 93.0    Collection site RIGHT BRACHIAL    Drawn by [id number]    Sample type ARTERIAL DRAW    Allens test (pass/fail) PASS PASS    Comment: Performed at [facility], [place] [address], [place], [place] [address]  CBG monitoring, ED     Status: Abnormal   Collection Time: 10/29/2017  2:35 PM  Result Value Ref Range   Glucose-Capillary 132 (H) 65 - 99 mg/dL  I-Stat CG4 Lactic Acid, ED     Status: Abnormal   Collection Time: 11/08/2017  2:51 PM  Result Value Ref Range   Lactic Acid, Venous 5.79 (HH) 0.5 - 1.9 mmol/L   Comment NOTIFIED PHYSICIAN   I-Stat Chem 8, ED     Status: Abnormal   Collection Time: 10/25/2017  2:51 PM  Result Value Ref Range   Sodium 144 135 - 145 mmol/L   Potassium 2.8 (L) 3.5 - 5.1 mmol/L   Chloride 103 101 - 111 mmol/L   BUN 51 (H) 6 - 20 mg/dL   Creatinine, Ser 4.90 (H) 0.44 - 1.00 mg/dL   Glucose, Bld 126 (H) 65 -  99 mg/dL   Calcium, Ion 1.02 (L) 1.15 - 1.40 mmol/L   TCO2 21 (L) 22 - 32 mmol/L   Hemoglobin 18.0 (H) 12.0 - 15.0 g/dL   HCT 53.0 (H) 36.0 - 46.0 %  I-stat troponin, ED     Status: Abnormal   Collection Time: 10/19/2017  5:34 PM  Result Value Ref Range   Troponin i, poc 0.48 (HH) 0.00 - 0.08 ng/mL   Comment NOTIFIED PHYSICIAN    Comment 3            Comment: Due to the release kinetics of cTnI, a negative result within the first hours of the onset of symptoms does not rule out myocardial infarction with certainty. If myocardial infarction is still suspected, repeat the test at appropriate intervals.   I-Stat CG4 Lactic Acid, ED  (not at  [facility])     Status: Abnormal   Collection Time: 10/23/2017  5:36 PM  Result Value Ref Range   Lactic Acid, Venous 3.81 (HH) 0.5 - 1.9 mmol/L  Comment NOTIFIED PHYSICIAN   Brain natriuretic peptide     Status: Abnormal   Collection Time: 10/25/2017  5:36 PM  Result Value Ref Range   B Natriuretic Peptide 311.0 (H) 0.0 - 100.0 pg/mL    Comment: Performed at [facility], [address] [place], [place] [address]  C difficile quick scan w PCR reflex     Status: None   Collection Time: 11/05/2017  7:00 PM  Result Value Ref Range   C Diff antigen NEGATIVE NEGATIVE   C Diff toxin NEGATIVE NEGATIVE   C Diff interpretation No C. difficile detected.     Comment: Performed at [facility], [address] [place], [place] [address]  Blood gas, arterial     Status: Abnormal   Collection Time: 11/07/2017  7:20 PM  Result Value Ref Range   O2 Content 3.0 L/min   pH, Arterial 7.421 7.350 - 7.450   pCO2 arterial 21.6 (L) 32.0 - 48.0 mmHg   pO2, Arterial 85.7 83.0 - 108.0 mmHg   Bicarbonate 13.8 (L) 20.0 - 28.0 mmol/L   Acid-base deficit 8.3 (H) 0.0 - 2.0 mmol/L   O2 Saturation 95.5 %   Patient temperature 98.6    Collection site RIGHT RADIAL    Drawn by [id number]    Sample type ARTERIAL DRAW    Allens test (pass/fail) PASS  PASS    Comment: Performed at [facility], [address] [place], [place] [address]   Dg Chest 2 View  Result Date: 10/27/2017 CLINICAL DATA:  Fall. EXAM: CHEST  2 VIEW COMPARISON:  CT chest dated October 05, 2017. Chest x-ray dated May 16, 2015. FINDINGS: Stable cardiomediastinal silhouette. Normal pulmonary vascularity. Atherosclerotic calcification of the aortic arch. No focal consolidation, pleural effusion, or pneumothorax. No acute osseous abnormality. IMPRESSION: No active cardiopulmonary disease. Electronically Signed   By: William T Derry M.D.   On: 10/29/2017 16:25   Dg Pelvis 1-2 Views  Result Date: 10/16/2017 CLINICAL DATA:  Recent fall EXAM: PELVIS - 1-2 VIEW COMPARISON:  None. FINDINGS: Pelvic ring is intact. No acute fracture or dislocation is seen. Degenerative changes of lumbar spine are noted. No soft tissue abnormality is noted. IMPRESSION: No acute abnormality seen. Electronically Signed   By: Mark  Lukens M.D.   On: 10/29/2017 16:24   Ct Head Wo Contrast  Result Date: 11/09/2017 CLINICAL DATA:  Altered mental status and recent fall EXAM: CT HEAD WITHOUT CONTRAST CT CERVICAL SPINE WITHOUT CONTRAST TECHNIQUE: Multidetector CT imaging of the head and cervical spine was performed following the standard protocol without intravenous contrast. Multiplanar CT image reconstructions of the cervical spine were also generated. COMPARISON:  None. FINDINGS: CT HEAD FINDINGS Brain: No evidence of acute infarction, hemorrhage, hydrocephalus, extra-axial collection or mass lesion/mass effect. Vascular: No hyperdense vessel or unexpected calcification. Skull: Normal. Negative for fracture or focal lesion. Sinuses/Orbits: No acute finding. Other: None. CT CERVICAL SPINE FINDINGS Alignment: Within normal limits. Skull base and vertebrae: 7 cervical segments are well visualized. Vertebral body height is well maintained. Facet hypertrophic changes are noted throughout the  cervical spine. No acute fracture or acute facet abnormality is noted. Osteophytic changes are noted most marked at C5-6 and C6-7. Soft tissues and spinal canal: Vascular calcifications are noted. No acute soft tissue abnormality is noted. Upper chest: Visualized upper chest is unremarkable. Other: None IMPRESSION: CT of the head: No acute intracranial abnormality noted. CT of the cervical spine: Multilevel degenerative change without acute abnormality. Electronically Signed     By: Inez Catalina M.D.   On: 10/24/2017 16:21   Ct Cervical Spine Wo Contrast  Result Date: 10/25/2017 CLINICAL DATA:  Altered mental status and recent fall EXAM: CT HEAD WITHOUT CONTRAST CT CERVICAL SPINE WITHOUT CONTRAST TECHNIQUE: Multidetector CT imaging of the head and cervical spine was performed following the standard protocol without intravenous contrast. Multiplanar CT image reconstructions of the cervical spine were also generated. COMPARISON:  None. FINDINGS: CT HEAD FINDINGS Brain: No evidence of acute infarction, hemorrhage, hydrocephalus, extra-axial collection or mass lesion/mass effect. Vascular: No hyperdense vessel or unexpected calcification. Skull: Normal. Negative for fracture or focal lesion. Sinuses/Orbits: No acute finding. Other: None. CT CERVICAL SPINE FINDINGS Alignment: Within normal limits. Skull base and vertebrae: 7 cervical segments are well visualized. Vertebral body height is well maintained. Facet hypertrophic changes are noted throughout the cervical spine. No acute fracture or acute facet abnormality is noted. Osteophytic changes are noted most marked at C5-6 and C6-7. Soft tissues and spinal canal: Vascular calcifications are noted. No acute soft tissue abnormality is noted. Upper chest: Visualized upper chest is unremarkable. Other: None IMPRESSION: CT of the head: No acute intracranial abnormality noted. CT of the cervical spine: Multilevel degenerative change without acute abnormality. Electronically  Signed   By: Inez Catalina M.D.   On: 10/15/2017 16:21    Pending Labs Unresulted Labs (From admission, onward)   Start     Ordered   10/27/17 0500  CK  Daily,   R     10/31/2017 1732   10/27/17 0500  Comprehensive metabolic panel  Tomorrow morning,   R     11/06/2017 1737   10/27/17 0500  Creatinine, serum  Daily,   R     11/02/2017 1804   10/27/17 0500  Vancomycin, random  Tomorrow morning,   R     11/04/2017 1804   10/27/17 0500  Phosphorus  Tomorrow morning,   R     10/25/2017 1859   10/27/17 0500  Magnesium  Tomorrow morning,   R     10/23/2017 1859   10/15/2017 2902  Basic metabolic panel  Once-Timed,   R     10/19/2017 1858   10/22/2017 1900  Lactic acid, plasma  STAT Now then every 3 hours,   R     10/27/2017 1731   10/18/2017 1900  Magnesium  Once,   R     11/06/2017 1731   11/10/2017 1736  HIV antibody (Routine Testing)  Once,   R     10/25/2017 1737   10/29/2017 1733  Gastrointestinal Panel by PCR , Stool  (Gastrointestinal Panel by PCR, Stool)  Once,   R     11/09/2017 1732   10/19/2017 1359  Urinalysis, Routine w reflex microscopic  STAT,   STAT     11/10/2017 1359   10/27/2017 1359  Urine culture  STAT,   STAT     10/14/2017 1359   10/19/2017 1359  Blood Cultures (routine x 2)  BLOOD CULTURE X 2,   STAT     10/17/2017 1359   10/18/2017 1358  Urine rapid drug screen (hosp performed)  STAT,   STAT     10/21/2017 1359      Vitals/Pain Today's Vitals   11/09/2017 1700 10/23/2017 1900 10/14/2017 2130 11/06/2017 2300  BP: (!) 151/119 (!) 152/103 (!) 177/98 (!) 171/101  Pulse:  (!) 125 96 95  Resp: (!) 25 (!) 54 (!) 30 (!) 24  Temp:      TempSrc:  SpO2:  100% 100% 100%    Isolation Precautions Enteric precautions (UV disinfection)  Medications Medications  sodium chloride 0.9 % bolus 1,000 mL (0 mLs Intravenous Stopped 11/05/2017 1649)    And  0.9 %  sodium chloride infusion ( Intravenous Rate/Dose Change 11/07/2017 1906)  potassium chloride 10 mEq in 100 mL IVPB (10 mEq Intravenous New Bag/Given 11/04/2017 2354)   thiamine (VITAMIN B-1) tablet 100 mg (100 mg Oral Given 10/22/2017 2345)  vancomycin (VANCOCIN) 2,000 mg in sodium chloride 0.9 % 500 mL IVPB (2,000 mg Intravenous New Bag/Given 10/27/17 0001)  piperacillin-tazobactam (ZOSYN) IVPB 2.25 g (not administered)  0.9 % NaCl with KCl 20 mEq/ L  infusion (not administered)  LORazepam (ATIVAN) injection 1-2 mg (not administered)  nicotine (NICODERM CQ - dosed in mg/24 hours) patch 14 mg (not administered)  levalbuterol (XOPENEX) nebulizer solution 0.63 mg (0.63 mg Nebulization Given 11/07/2017 2027)  sodium chloride 0.9 % bolus 1,000 mL (1,000 mLs Intravenous New Bag/Given 11/06/2017 1603)  sodium chloride 0.9 % bolus 1,000 mL (1,000 mLs Intravenous New Bag/Given 10/22/2017 1600)  potassium chloride 10 mEq in 100 mL IVPB (0 mEq Intravenous Stopped 10/23/2017 2111)  piperacillin-tazobactam (ZOSYN) IVPB 3.375 g (0 g Intravenous Stopped 10/30/2017 1742)  potassium chloride SA (K-DUR,KLOR-CON) CR tablet 40 mEq (40 mEq Oral Given 10/24/2017 2345)    Mobility non-ambulatory

## 2017-10-27 NOTE — Care Management Note (Signed)
Case Management Note  Patient Details  Name: Brooke Garcia MRN: [id number] Date of Birth: 01/24/1956  Subjective/Objective:                  sepsis Action/Plan: Date: October 27, 2017 Velva Harman, BSN, [place], [place] Chart and notes review for patient progress and needs. Will follow for case management and discharge needs. No cm or discharge needs present at time of this review. Next review date: [id number]  Expected Discharge Date:  (unknown)               Expected Discharge Plan:  Home/Self Care  In-House Referral:     Discharge planning Services  CM Consult  Post Acute Care Choice:    Choice offered to:     DME Arranged:    DME Agency:     HH Arranged:    HH Agency:     Status of Service:  In process, will continue to follow  If discussed at Long Length of Stay Meetings, dates discussed:    Additional Comments:  Leeroy Cha, RN 10/27/2017, 8:38 AM

## 2017-10-27 NOTE — Progress Notes (Signed)
CRITICAL VALUE ALERT  Critical Value: Lactic Acid 2.2  Date & Time Notified:  10/27/2017 @0445   Provider Notified: MD Deterding  Orders Received/Actions taken:No orders received. Will continue to monitor

## 2017-10-27 NOTE — Progress Notes (Signed)
PROGRESS NOTE    Brooke Garcia  OEV:[id number] DOB: 09-13-1956 DOA: 10/27/2017 PCP: Janith Lima, MD   Brief Narrative: Brooke Garcia is a 62 y.o. female with medical history significant of hypertension, COPD, CKD, DVT, hepatitis C,uterine prolapse, thoracic abdominal aneurysm, hypercoagulable state who was brought to the emergency department after she was found on the floor. Patient found to have altered mental status ,diarrhea, acute renal failure, lactic acidosis, severe leukocytosis and elevated CPK on presentation.Sepsis was suspected and she has been started on broad-spectrum antibiotics.  PCCM and nephrology following    Assessment & Plan:   Principal Problem:   Acute kidney injury superimposed on CKD ([place]) Active Problems:   TOBACCO ABUSE   HYPERTENSION, BENIGN ESSENTIAL   DVT of lower extremity, bilateral (HCC)   COPD, moderate (HCC)   Leucocytosis   Chronic renal disease, stage 3, moderately decreased glomerular filtration rate (GFR) between 30-59 mL/min/1.73 square meter (HCC)   Loose stools   Severe sepsis (HCC)   Rhabdomyolysis   Hypokalemia   Altered mental status   Uterine prolapse   Elevated liver enzymes  Acute kidney injury on CKD: Her creatinine on 09/08/17 was 1.3.  Presented with severe acute kidney injury .  Most likely this is prerenal AKI due to volume depletion versus acute tubular necrosis due to rhabdo.Nephrology following.Also started on bicarb drip for acidosis. Difficult foley catheterization due to uterine prolapse.  Urology consulted for Foley catheter placement.  Possible sepsis: Unknown etiology .  Patient presented with severe lactic acidosis and leukocytosis.  Blood pressure stable. Chest x-ray did not show any pneumonia.  UA was not impressive for UTI Blood cultures and urine culture have been sent. Patient has been started on broad-spectrum antibiotics.  Will monitor lactic acid which was elevated on presentation.  Antibiotics will be  stopped as appropriate if needed. Leucocytosis improving  Altered mental status: Most likely secondary to dehydration, acute kidney injury and sepsis. Cud also be secondary to polypharmacy and drugs. We will continue to monitor her status. Mental status has improved .We will also check UDS.  Patient was also on Xanax and oxycodone at home.  We will hold these. CT imaging done in the emergency department did not show any acute intracranial abnormalities.  Diarrhoea:We have sent C. difficile, GI pathogen panel.  On rectal tube.  Significant found to be negative.  Patient also has generalized abdominal tenderness.  Will order CT abdomen/pelvis without contrast.  Fall/rhabdomyolysis:  We will continue IV fluids aggressively and continue to monitor CK which is improving.  She will be evaluated by physical therapy later on. Imagings done in the emergency department did not show any acute fracture or dislocation.  Smoker/history of COPD: Currently COPD is stable.  She follows with pulmonology as an outpatient.  Continue to monitor respiratory status.  She was hypoxic on presentation most likely secondary to sepsis.  History of DVT/hypercoagulability: On Xarelto at home which she might need to be taking for a while.  Started on heparin drip.  Hypokalemia: We will continue  repletion.  Hypertension: Currently blood pressure stable.  She is  on Irbesartan at home which we will hold due to acute kidney injury   DVT prophylaxis:Heparin Code Status: Full Family Communication: None present at the bedside Disposition Plan: Unknown at this time   Consultants: Nephrology,PCCM  Procedures: None  Antimicrobials: Vancomycin and Zosyn since 11/03/2017  Subjective: Patient seen and examined at bedside this morning.  Mental status is improved.  Vital signs stable.  She is still not fully oriented. Says that she has abdominal pain.  Objective: Vitals:   10/27/17 0429 10/27/17 0600 10/27/17 0629  10/27/17 0708  BP: (!) 176/105  (!) 153/134   Pulse: (!) 47 88 92   Resp: (!) 38 (!) 32 (!) 30   Temp:    98.8 F (37.1 C)  TempSrc:    Axillary  SpO2: 90% 100% 97%    No intake or output data in the 24 hours ending 10/27/17 1209 There were no vitals filed for this visit.  Examination:  General exam: Appears calm and comfortable ,Not in distress,obese HEENT:PERRL,Oral mucosa dry, Ear/Nose normal on gross exam Respiratory system: Bilateral equal air entry, normal vesicular breath sounds, no wheezes or crackles  Cardiovascular system: S1 & S2 heard, RRR. No JVD, murmurs, rubs, gallops or clicks. No pedal edema. Gastrointestinal system: Abdomen is nondistended, soft .  Diffuse generalized tenderness.  No organomegaly or masses felt. Normal bowel sounds heard. Central nervous system: Alert and but not fully oriented.  Oriented just to place. No focal neurological deficits. Extremities: No edema, no clubbing ,no cyanosis, distal peripheral pulses palpable. Skin: No rashes, lesions or ulcers,no icterus ,no pallor.Mottled skin on the peripheries MSK: Normal muscle bulk,tone ,power Psychiatry: Judgement and insight appear impaired   Data Reviewed: I have personally reviewed following labs and imaging studies  CBC: Recent Labs  Lab 11/02/2017 1429 11/09/2017 1451 10/27/17 0816  WBC 30.5*  --  23.4*  NEUTROABS 26.2*  --  19.5*  HGB 18.1* 18.0* 16.2*  HCT 48.4* 53.0* 44.8  MCV 94.2  --  95.5  PLT 266  --  253   Basic Metabolic Panel: Recent Labs  Lab 11/06/2017 1429 10/27/2017 1451 10/27/17 0345  NA 144 144 145  K 2.9* 2.8* 3.4*  CL 100* 103 111  CO2 18*  --  15*  GLUCOSE 138* 126* 111*  BUN 58* 51* 62*  CREATININE 5.10* 4.90* 5.07*  CALCIUM 9.8  --  8.0*  MG  --   --  1.9  PHOS  --   --  4.2   GFR: CrCl cannot be calculated (Unknown ideal weight.). Liver Function Tests: Recent Labs  Lab 10/16/2017 1429 10/27/17 0345  AST 245* 231*  ALT 97* 91*  ALKPHOS 97 69  BILITOT  1.6* 1.5*  PROT 9.4* 6.9  ALBUMIN 4.1 3.0*   No results for input(s): LIPASE, AMYLASE in the last 168 hours. Recent Labs  Lab 11/07/2017 1429  AMMONIA 17   Coagulation Profile: Recent Labs  Lab 11/02/2017 1429  INR 1.09   Cardiac Enzymes: Recent Labs  Lab 10/14/2017 1429 10/27/17 0345  CKTOTAL 9,306* 6,917*   BNP (last 3 results) No results for input(s): PROBNP in the last 8760 hours. HbA1C: No results for input(s): HGBA1C in the last 72 hours. CBG: Recent Labs  Lab 10/19/2017 1435  GLUCAP 132*   Lipid Profile: No results for input(s): CHOL, HDL, LDLCALC, TRIG, CHOLHDL, LDLDIRECT in the last 72 hours. Thyroid Function Tests: No results for input(s): TSH, T4TOTAL, FREET4, T3FREE, THYROIDAB in the last 72 hours. Anemia Panel: No results for input(s): VITAMINB12, FOLATE, FERRITIN, TIBC, IRON, RETICCTPCT in the last 72 hours. Sepsis Labs: Recent Labs  Lab 11/09/2017 1451 11/10/2017 1736 10/27/17 0050 10/27/17 0345  LATICACIDVEN 5.79* 3.81* 2.6* 2.2*    Recent Results (from the past 240 hour(s))  C difficile quick scan w PCR reflex     Status: None   Collection Time: 10/20/2017  7:00 PM  Result  Value Ref Range Status   C Diff antigen NEGATIVE NEGATIVE Final   C Diff toxin NEGATIVE NEGATIVE Final   C Diff interpretation No C. difficile detected.  Final    Comment: Performed at [facility], [address], [place], [place] [address]         Radiology Studies: Dg Chest 2 View  Result Date: 11/05/2017 CLINICAL DATA:  Fall. EXAM: CHEST  2 VIEW COMPARISON:  CT chest dated October 05, 2017. Chest x-ray dated May 16, 2015. FINDINGS: Stable cardiomediastinal silhouette. Normal pulmonary vascularity. Atherosclerotic calcification of the aortic arch. No focal consolidation, pleural effusion, or pneumothorax. No acute osseous abnormality. IMPRESSION: No active cardiopulmonary disease. Electronically Signed   By: Titus Dubin M.D.   On: 10/15/2017 16:25     Dg Pelvis 1-2 Views  Result Date: 10/14/2017 CLINICAL DATA:  Recent fall EXAM: PELVIS - 1-2 VIEW COMPARISON:  None. FINDINGS: Pelvic ring is intact. No acute fracture or dislocation is seen. Degenerative changes of lumbar spine are noted. No soft tissue abnormality is noted. IMPRESSION: No acute abnormality seen. Electronically Signed   By: Inez Catalina M.D.   On: 10/29/2017 16:24   Ct Head Wo Contrast  Result Date: 10/24/2017 CLINICAL DATA:  Altered mental status and recent fall EXAM: CT HEAD WITHOUT CONTRAST CT CERVICAL SPINE WITHOUT CONTRAST TECHNIQUE: Multidetector CT imaging of the head and cervical spine was performed following the standard protocol without intravenous contrast. Multiplanar CT image reconstructions of the cervical spine were also generated. COMPARISON:  None. FINDINGS: CT HEAD FINDINGS Brain: No evidence of acute infarction, hemorrhage, hydrocephalus, extra-axial collection or mass lesion/mass effect. Vascular: No hyperdense vessel or unexpected calcification. Skull: Normal. Negative for fracture or focal lesion. Sinuses/Orbits: No acute finding. Other: None. CT CERVICAL SPINE FINDINGS Alignment: Within normal limits. Skull base and vertebrae: 7 cervical segments are well visualized. Vertebral body height is well maintained. Facet hypertrophic changes are noted throughout the cervical spine. No acute fracture or acute facet abnormality is noted. Osteophytic changes are noted most marked at C5-6 and C6-7. Soft tissues and spinal canal: Vascular calcifications are noted. No acute soft tissue abnormality is noted. Upper chest: Visualized upper chest is unremarkable. Other: None IMPRESSION: CT of the head: No acute intracranial abnormality noted. CT of the cervical spine: Multilevel degenerative change without acute abnormality. Electronically Signed   By: Inez Catalina M.D.   On: 10/22/2017 16:21   Ct Cervical Spine Wo Contrast  Result Date: 10/30/2017 CLINICAL DATA:  Altered mental  status and recent fall EXAM: CT HEAD WITHOUT CONTRAST CT CERVICAL SPINE WITHOUT CONTRAST TECHNIQUE: Multidetector CT imaging of the head and cervical spine was performed following the standard protocol without intravenous contrast. Multiplanar CT image reconstructions of the cervical spine were also generated. COMPARISON:  None. FINDINGS: CT HEAD FINDINGS Brain: No evidence of acute infarction, hemorrhage, hydrocephalus, extra-axial collection or mass lesion/mass effect. Vascular: No hyperdense vessel or unexpected calcification. Skull: Normal. Negative for fracture or focal lesion. Sinuses/Orbits: No acute finding. Other: None. CT CERVICAL SPINE FINDINGS Alignment: Within normal limits. Skull base and vertebrae: 7 cervical segments are well visualized. Vertebral body height is well maintained. Facet hypertrophic changes are noted throughout the cervical spine. No acute fracture or acute facet abnormality is noted. Osteophytic changes are noted most marked at C5-6 and C6-7. Soft tissues and spinal canal: Vascular calcifications are noted. No acute soft tissue abnormality is noted. Upper chest: Visualized upper chest is unremarkable. Other: None IMPRESSION: CT of the head: No  acute intracranial abnormality noted. CT of the cervical spine: Multilevel degenerative change without acute abnormality. Electronically Signed   By: Inez Catalina M.D.   On: 11/04/2017 16:21        Scheduled Meds: . fentaNYL (SUBLIMAZE) injection  50 mcg Intravenous Once  . levalbuterol  0.63 mg Nebulization Q6H  . nicotine  14 mg Transdermal Daily  . thiamine  100 mg Oral Daily   Continuous Infusions: . heparin 1,200 Units/hr (10/27/17 1011)  . piperacillin-tazobactam (ZOSYN)  IV Stopped (10/27/17 8916)  .  sodium bicarbonate (isotonic) infusion in sterile water 100 mL/hr at 10/27/17 1014     LOS: 1 day    Time spent: More than 50% of that time was spent in counseling and/or coordination of care.      Marene Lenz, MD [facility] Pager [phone]  If 7PM-7AM, please contact night-coverage [email or web address] Password [facility] 10/27/2017, 12:09 PM

## 2017-10-27 NOTE — Progress Notes (Signed)
Patient IV line infusing potassium stopped and removed. Patient c/o burning and left arm swollen. MD notified and modified fluids going through IV. Potassium stopped per MD. Removed IV while aspirating and placed heating pad on arm per pharmacist. Dressing placed. Will continue to monitor

## 2017-10-27 NOTE — ED Notes (Signed)
ED TO INPATIENT HANDOFF REPORT  Name/Age/Gender Brooke Garcia 62 y.o. female  Code Status    Code Status Orders  (From admission, onward)        Start     Ordered   11/10/2017 1737  Full code  Continuous     10/20/2017 1737    Code Status History    Date Active Date Inactive Code Status Order ID Comments User Context   05/08/2015 17:05 05/12/2015 14:07 Full Code [id number]  Gherghe, Costin M, MD ED      Home/SNF/Other Home  Chief Complaint Hypokalemia [E87.6] Lactic acidosis [E87.2] Fall, initial encounter [W19.XXXA] Acute renal failure, unspecified acute renal failure type (HCC) [N17.9]  Level of Care/Admitting Diagnosis ED Disposition    ED Disposition Condition Comment   Admit  Hospital Area: [facility] [[id number]]  Level of Care: Stepdown [14]  Admit to SDU based on following criteria: Hemodynamic compromise or significant risk of instability:  Patient requiring short term acute titration and management of vasoactive drips, and invasive monitoring (i.e., CVP and Arterial line).  Diagnosis: Severe sepsis (HCC) [1192020]  Admitting Physician: ADHIKARI BK, AMRIT [[id number]]  Attending Physician: ADHIKARI BK, AMRIT [[id number]]  Estimated length of stay: past midnight tomorrow  Certification:: I certify this patient will need inpatient services for at least 2 midnights  PT Class (Do Not Modify): Inpatient [101]  PT Acc Code (Do Not Modify): Private [1]       Medical History Past Medical History:  Diagnosis Date  . Acute venous embolism and thrombosis of unspecified deep vessels of lower extremity   . Anxiety state, unspecified   . Arthritis   . Blood dyscrasia    protein def.  . Calculus of kidney   . Colitis   . Complete rupture of rotator cuff   . COPD (chronic obstructive pulmonary disease) (HCC)   . Degeneration of intervertebral disc, site unspecified   . Depression   . DVT (deep venous thrombosis) (HCC)   . Family history of ischemic heart  disease   . History of gallstones   . HLD (hyperlipidemia)   . HTN (hypertension)   . Other and unspecified noninfectious gastroenteritis and colitis(558.9)   . Painful respiration   . Primary hypercoagulable state (HCC)   . Protein S deficiency (HCC)   . Renal failure   . Tobacco use disorder   . Unspecified urinary incontinence   . Venous stasis ulcers (HCC)    left leg    Allergies Allergies  Allergen Reactions  . Butalbital-Apap-Caffeine Hives  . Naproxen Hives  . Tylenol [Acetaminophen] Other (See Comments)    Upset stomach    IV Location/Drains/Wounds Patient Lines/Drains/Airways Status   Active Line/Drains/Airways    Name:   Placement date:   Placement time:   Site:   Days:   Peripheral IV 10/20/2017 Left Antecubital   11/06/2017    1440    Antecubital   1   AIRWAYS   04/27/12    1226     2009   AIRWAYS   04/27/12    1226     2009   Airway   04/27/12    1229     2009   Pressure Ulcer 09/08/15 Stage III -  Full thickness tissue loss. Subcutaneous fat may be visible but bone, tendon or muscle are NOT exposed. Venus stasis ulcer that pt states has had for over 2 months and on going treatment at the wound care clinic.   09/08/15      2306     780          Labs/Imaging Results for orders placed or performed during the hospital encounter of 10/29/2017 (from the past 48 hour(s))  Comprehensive metabolic panel     Status: Abnormal   Collection Time: 10/31/2017  2:29 PM  Result Value Ref Range   Sodium 144 135 - 145 mmol/L   Potassium 2.9 (L) 3.5 - 5.1 mmol/L   Chloride 100 (L) 101 - 111 mmol/L   CO2 18 (L) 22 - 32 mmol/L   Glucose, Bld 138 (H) 65 - 99 mg/dL   BUN 58 (H) 6 - 20 mg/dL   Creatinine, Ser 5.10 (H) 0.44 - 1.00 mg/dL   Calcium 9.8 8.9 - 10.3 mg/dL   Total Protein 9.4 (H) 6.5 - 8.1 g/dL   Albumin 4.1 3.5 - 5.0 g/dL   AST 245 (H) 15 - 41 U/L   ALT 97 (H) 14 - 54 U/L   Alkaline Phosphatase 97 38 - 126 U/L   Total Bilirubin 1.6 (H) 0.3 - 1.2 mg/dL   GFR calc non Af  Amer 8 (L) >60 mL/min   GFR calc Af Amer 10 (L) >60 mL/min    Comment: (NOTE) The eGFR has been calculated using the CKD EPI equation. This calculation has not been validated in all clinical situations. eGFR's persistently <60 mL/min signify possible Chronic Kidney Disease.    Anion gap 26 (H) 5 - 15    Comment: REPEATED TO VERIFY Performed at [facility], [address] [place], [place] [address]   CBC WITH DIFFERENTIAL     Status: Abnormal   Collection Time: 11/02/2017  2:29 PM  Result Value Ref Range   WBC 30.5 (H) 4.0 - 10.5 K/uL   RBC 5.14 (H) 3.87 - 5.11 MIL/uL   Hemoglobin 18.1 (H) 12.0 - 15.0 g/dL   HCT 48.4 (H) 36.0 - 46.0 %   MCV 94.2 78.0 - 100.0 fL   MCH 35.2 (H) 26.0 - 34.0 pg   MCHC 37.4 (H) 30.0 - 36.0 g/dL    Comment: REPEATED TO VERIFY RULED OUT INTERFERING SUBSTANCES    RDW 13.4 11.5 - 15.5 %   Platelets 266 150 - 400 K/uL   Neutrophils Relative % 86 %   Lymphocytes Relative 11 %   Monocytes Relative 3 %   Eosinophils Relative 0 %   Basophils Relative 0 %   Neutro Abs 26.2 (H) 1.7 - 7.7 K/uL   Lymphs Abs 3.4 0.7 - 4.0 K/uL   Monocytes Absolute 0.9 0.1 - 1.0 K/uL   Eosinophils Absolute 0.0 0.0 - 0.7 K/uL   Basophils Absolute 0.0 0.0 - 0.1 K/uL   Smear Review MORPHOLOGY UNREMARKABLE     Comment: Performed at [facility], [address] [place], [place] [address]  Ammonia     Status: None   Collection Time: 10/28/2017  2:29 PM  Result Value Ref Range   Ammonia 17 9 - 35 umol/L    Comment: Performed at [facility], [address] [place], [place] [address]  Ethanol     Status: None   Collection Time: 10/20/2017  2:29 PM  Result Value Ref Range   Alcohol, Ethyl (B) <10 <10 mg/dL    Comment:        LOWEST DETECTABLE LIMIT FOR SERUM ALCOHOL IS 10 mg/dL FOR MEDICAL PURPOSES ONLY Performed at [facility], [address] [place],  [address]   Protime-INR       Status: None    Collection Time: 10/27/2017  2:29 PM  Result Value Ref Range   Prothrombin Time 14.0 11.4 - 15.2 seconds   INR 1.09     Comment: Performed at [facility], [place] [address], [place], [place] [address]  CK     Status: Abnormal   Collection Time: 10/23/2017  2:29 PM  Result Value Ref Range   Total CK 9,306 (H) 38 - 234 U/L    Comment: RESULTS CONFIRMED BY MANUAL DILUTION Performed at [facility], [place] [address], [place], [place] [address]   Blood gas, arterial ([facility] & [facility] ONLY)     Status: Abnormal   Collection Time: 11/05/2017  2:30 PM  Result Value Ref Range   FIO2 0.21    pH, Arterial 7.476 (H) 7.350 - 7.450   pCO2 arterial below reportable range 32.0 - 48.0 mmHg    Comment: rbv dr Gilford Raid by Eben Burow rrt rcp on 10/20/2017 at 1433   pO2, Arterial 75.8 (L) 83.0 - 108.0 mmHg   Bicarbonate unable to calculate due to low co2 20.0 - 28.0 mmol/L   Acid-Base Excess unable to calculate 0.0 - 2.0 mmol/L   Acid-base deficit unable to calculate 0.0 - 2.0 mmol/L   O2 Saturation 94.0 %   Patient temperature 93.0    Collection site RIGHT BRACHIAL    Drawn by [id number]    Sample type ARTERIAL DRAW    Allens test (pass/fail) PASS PASS    Comment: Performed at [facility], [place] [address], [place], [place] [address]  CBG monitoring, ED     Status: Abnormal   Collection Time: 10/30/2017  2:35 PM  Result Value Ref Range   Glucose-Capillary 132 (H) 65 - 99 mg/dL  I-Stat CG4 Lactic Acid, ED     Status: Abnormal   Collection Time: 10/27/2017  2:51 PM  Result Value Ref Range   Lactic Acid, Venous 5.79 (HH) 0.5 - 1.9 mmol/L   Comment NOTIFIED PHYSICIAN   I-Stat Chem 8, ED     Status: Abnormal   Collection Time: 10/21/2017  2:51 PM  Result Value Ref Range   Sodium 144 135 - 145 mmol/L   Potassium 2.8 (L) 3.5 - 5.1 mmol/L   Chloride 103 101 - 111 mmol/L   BUN 51 (H) 6 - 20 mg/dL   Creatinine, Ser 4.90 (H) 0.44 - 1.00 mg/dL   Glucose, Bld 126 (H) 65 -  99 mg/dL   Calcium, Ion 1.02 (L) 1.15 - 1.40 mmol/L   TCO2 21 (L) 22 - 32 mmol/L   Hemoglobin 18.0 (H) 12.0 - 15.0 g/dL   HCT 53.0 (H) 36.0 - 46.0 %  I-stat troponin, ED     Status: Abnormal   Collection Time: 11/02/2017  5:34 PM  Result Value Ref Range   Troponin i, poc 0.48 (HH) 0.00 - 0.08 ng/mL   Comment NOTIFIED PHYSICIAN    Comment 3            Comment: Due to the release kinetics of cTnI, a negative result within the first hours of the onset of symptoms does not rule out myocardial infarction with certainty. If myocardial infarction is still suspected, repeat the test at appropriate intervals.   I-Stat CG4 Lactic Acid, ED  (not at  [facility])     Status: Abnormal   Collection Time: 10/18/2017  5:36 PM  Result Value Ref Range   Lactic Acid, Venous 3.81 (HH) 0.5 - 1.9 mmol/L  Comment NOTIFIED PHYSICIAN   Brain natriuretic peptide     Status: Abnormal   Collection Time: 11/09/2017  5:36 PM  Result Value Ref Range   B Natriuretic Peptide 311.0 (H) 0.0 - 100.0 pg/mL    Comment: Performed at [facility], [address] [place], [place] [address]  C difficile quick scan w PCR reflex     Status: None   Collection Time: 10/19/2017  7:00 PM  Result Value Ref Range   C Diff antigen NEGATIVE NEGATIVE   C Diff toxin NEGATIVE NEGATIVE   C Diff interpretation No C. difficile detected.     Comment: Performed at [facility], [address] [place], [place] [address]  Blood gas, arterial     Status: Abnormal   Collection Time: 10/28/2017  7:20 PM  Result Value Ref Range   O2 Content 3.0 L/min   pH, Arterial 7.421 7.350 - 7.450   pCO2 arterial 21.6 (L) 32.0 - 48.0 mmHg   pO2, Arterial 85.7 83.0 - 108.0 mmHg   Bicarbonate 13.8 (L) 20.0 - 28.0 mmol/L   Acid-base deficit 8.3 (H) 0.0 - 2.0 mmol/L   O2 Saturation 95.5 %   Patient temperature 98.6    Collection site RIGHT RADIAL    Drawn by [id number]    Sample type ARTERIAL DRAW    Allens test (pass/fail) PASS  PASS    Comment: Performed at [facility], [address] [place], [place] [address]   Dg Chest 2 View  Result Date: 10/23/2017 CLINICAL DATA:  Fall. EXAM: CHEST  2 VIEW COMPARISON:  CT chest dated October 05, 2017. Chest x-ray dated May 16, 2015. FINDINGS: Stable cardiomediastinal silhouette. Normal pulmonary vascularity. Atherosclerotic calcification of the aortic arch. No focal consolidation, pleural effusion, or pneumothorax. No acute osseous abnormality. IMPRESSION: No active cardiopulmonary disease. Electronically Signed   By: William T Derry M.D.   On: 10/19/2017 16:25   Dg Pelvis 1-2 Views  Result Date: 10/30/2017 CLINICAL DATA:  Recent fall EXAM: PELVIS - 1-2 VIEW COMPARISON:  None. FINDINGS: Pelvic ring is intact. No acute fracture or dislocation is seen. Degenerative changes of lumbar spine are noted. No soft tissue abnormality is noted. IMPRESSION: No acute abnormality seen. Electronically Signed   By: Mark  Lukens M.D.   On: 11/07/2017 16:24   Ct Head Wo Contrast  Result Date: 10/21/2017 CLINICAL DATA:  Altered mental status and recent fall EXAM: CT HEAD WITHOUT CONTRAST CT CERVICAL SPINE WITHOUT CONTRAST TECHNIQUE: Multidetector CT imaging of the head and cervical spine was performed following the standard protocol without intravenous contrast. Multiplanar CT image reconstructions of the cervical spine were also generated. COMPARISON:  None. FINDINGS: CT HEAD FINDINGS Brain: No evidence of acute infarction, hemorrhage, hydrocephalus, extra-axial collection or mass lesion/mass effect. Vascular: No hyperdense vessel or unexpected calcification. Skull: Normal. Negative for fracture or focal lesion. Sinuses/Orbits: No acute finding. Other: None. CT CERVICAL SPINE FINDINGS Alignment: Within normal limits. Skull base and vertebrae: 7 cervical segments are well visualized. Vertebral body height is well maintained. Facet hypertrophic changes are noted throughout the  cervical spine. No acute fracture or acute facet abnormality is noted. Osteophytic changes are noted most marked at C5-6 and C6-7. Soft tissues and spinal canal: Vascular calcifications are noted. No acute soft tissue abnormality is noted. Upper chest: Visualized upper chest is unremarkable. Other: None IMPRESSION: CT of the head: No acute intracranial abnormality noted. CT of the cervical spine: Multilevel degenerative change without acute abnormality. Electronically Signed     By: Inez Catalina M.D.   On: 10/17/2017 16:21   Ct Cervical Spine Wo Contrast  Result Date: 10/21/2017 CLINICAL DATA:  Altered mental status and recent fall EXAM: CT HEAD WITHOUT CONTRAST CT CERVICAL SPINE WITHOUT CONTRAST TECHNIQUE: Multidetector CT imaging of the head and cervical spine was performed following the standard protocol without intravenous contrast. Multiplanar CT image reconstructions of the cervical spine were also generated. COMPARISON:  None. FINDINGS: CT HEAD FINDINGS Brain: No evidence of acute infarction, hemorrhage, hydrocephalus, extra-axial collection or mass lesion/mass effect. Vascular: No hyperdense vessel or unexpected calcification. Skull: Normal. Negative for fracture or focal lesion. Sinuses/Orbits: No acute finding. Other: None. CT CERVICAL SPINE FINDINGS Alignment: Within normal limits. Skull base and vertebrae: 7 cervical segments are well visualized. Vertebral body height is well maintained. Facet hypertrophic changes are noted throughout the cervical spine. No acute fracture or acute facet abnormality is noted. Osteophytic changes are noted most marked at C5-6 and C6-7. Soft tissues and spinal canal: Vascular calcifications are noted. No acute soft tissue abnormality is noted. Upper chest: Visualized upper chest is unremarkable. Other: None IMPRESSION: CT of the head: No acute intracranial abnormality noted. CT of the cervical spine: Multilevel degenerative change without acute abnormality. Electronically  Signed   By: Inez Catalina M.D.   On: 10/31/2017 16:21    Pending Labs Unresulted Labs (From admission, onward)   Start     Ordered   10/27/17 0500  CK  Daily,   R     11/10/2017 1732   10/27/17 0500  Comprehensive metabolic panel  Tomorrow morning,   R     10/21/2017 1737   10/27/17 0500  Creatinine, serum  Daily,   R     10/22/2017 1804   10/27/17 0500  Vancomycin, random  Tomorrow morning,   R     10/17/2017 1804   10/27/17 0500  Phosphorus  Tomorrow morning,   R     10/19/2017 1859   10/27/17 0500  Magnesium  Tomorrow morning,   R     10/14/2017 1859   10/24/2017 2902  Basic metabolic panel  Once-Timed,   R     10/28/2017 1858   11/04/2017 1900  Lactic acid, plasma  STAT Now then every 3 hours,   R     11/09/2017 1731   11/06/2017 1900  Magnesium  Once,   R     11/10/2017 1731   11/02/2017 1736  HIV antibody (Routine Testing)  Once,   R     10/17/2017 1737   11/10/2017 1733  Gastrointestinal Panel by PCR , Stool  (Gastrointestinal Panel by PCR, Stool)  Once,   R     11/04/2017 1732   10/20/2017 1359  Urinalysis, Routine w reflex microscopic  STAT,   STAT     10/19/2017 1359   10/27/2017 1359  Urine culture  STAT,   STAT     10/24/2017 1359   11/06/2017 1359  Blood Cultures (routine x 2)  BLOOD CULTURE X 2,   STAT     10/21/2017 1359   10/14/2017 1358  Urine rapid drug screen (hosp performed)  STAT,   STAT     10/14/2017 1359      Vitals/Pain Today's Vitals   10/22/2017 1700 10/14/2017 1900 10/14/2017 2130 10/15/2017 2300  BP: (!) 151/119 (!) 152/103 (!) 177/98 (!) 171/101  Pulse:  (!) 125 96 95  Resp: (!) 25 (!) 54 (!) 30 (!) 24  Temp:      TempSrc:  SpO2:  100% 100% 100%    Isolation Precautions Enteric precautions (UV disinfection)  Medications Medications  sodium chloride 0.9 % bolus 1,000 mL (0 mLs Intravenous Stopped 11/09/2017 1649)    And  0.9 %  sodium chloride infusion ( Intravenous Rate/Dose Change 10/31/2017 1906)  potassium chloride 10 mEq in 100 mL IVPB (10 mEq Intravenous New Bag/Given 10/21/2017 2354)   thiamine (VITAMIN B-1) tablet 100 mg (100 mg Oral Given 11/06/2017 2345)  vancomycin (VANCOCIN) 2,000 mg in sodium chloride 0.9 % 500 mL IVPB (2,000 mg Intravenous New Bag/Given 10/27/17 0001)  piperacillin-tazobactam (ZOSYN) IVPB 2.25 g (not administered)  0.9 % NaCl with KCl 20 mEq/ L  infusion (not administered)  LORazepam (ATIVAN) injection 1-2 mg (not administered)  nicotine (NICODERM CQ - dosed in mg/24 hours) patch 14 mg (not administered)  levalbuterol (XOPENEX) nebulizer solution 0.63 mg (0.63 mg Nebulization Given 10/30/2017 2027)  sodium chloride 0.9 % bolus 1,000 mL (1,000 mLs Intravenous New Bag/Given 10/19/2017 1603)  sodium chloride 0.9 % bolus 1,000 mL (1,000 mLs Intravenous New Bag/Given 10/30/2017 1600)  potassium chloride 10 mEq in 100 mL IVPB (0 mEq Intravenous Stopped 10/21/2017 2111)  piperacillin-tazobactam (ZOSYN) IVPB 3.375 g (0 g Intravenous Stopped 11/05/2017 1742)  potassium chloride SA (K-DUR,KLOR-CON) CR tablet 40 mEq (40 mEq Oral Given 10/21/2017 2345)    Mobility non-ambulatory  

## 2017-10-28 ENCOUNTER — Inpatient Hospital Stay (HOSPITAL_COMMUNITY): Payer: Medicare Other

## 2017-10-28 DIAGNOSIS — K529 Noninfective gastroenteritis and colitis, unspecified: Secondary | ICD-10-CM

## 2017-10-28 DIAGNOSIS — K7010 Alcoholic hepatitis without ascites: Secondary | ICD-10-CM

## 2017-10-28 DIAGNOSIS — R197 Diarrhea, unspecified: Secondary | ICD-10-CM

## 2017-10-28 DIAGNOSIS — N189 Chronic kidney disease, unspecified: Secondary | ICD-10-CM

## 2017-10-28 DIAGNOSIS — R933 Abnormal findings on diagnostic imaging of other parts of digestive tract: Secondary | ICD-10-CM

## 2017-10-28 LAB — CBC WITH DIFFERENTIAL/PLATELET
Basophils Absolute: 0 10*3/uL (ref 0.0–0.1)
Basophils Relative: 0 %
Eosinophils Absolute: 0 10*3/uL (ref 0.0–0.7)
Eosinophils Relative: 0 %
HCT: 36.3 % (ref 36.0–46.0)
Hemoglobin: 13.1 g/dL (ref 12.0–15.0)
Lymphocytes Relative: 11 %
Lymphs Abs: 2.2 10*3/uL (ref 0.7–4.0)
MCH: 34.1 pg — ABNORMAL HIGH (ref 26.0–34.0)
MCHC: 36.1 g/dL — ABNORMAL HIGH (ref 30.0–36.0)
MCV: 94.5 fL (ref 78.0–100.0)
Monocytes Absolute: 1.1 10*3/uL — ABNORMAL HIGH (ref 0.1–1.0)
Monocytes Relative: 5 %
Neutro Abs: 16.3 10*3/uL — ABNORMAL HIGH (ref 1.7–7.7)
Neutrophils Relative %: 84 %
Platelets: 150 10*3/uL (ref 150–400)
RBC: 3.84 MIL/uL — ABNORMAL LOW (ref 3.87–5.11)
RDW: 13.4 % (ref 11.5–15.5)
WBC: 19.5 10*3/uL — ABNORMAL HIGH (ref 4.0–10.5)

## 2017-10-28 LAB — URINE CULTURE: CULTURE: NO GROWTH

## 2017-10-28 LAB — BASIC METABOLIC PANEL WITH GFR
Anion gap: 19 — ABNORMAL HIGH (ref 5–15)
BUN: 72 mg/dL — ABNORMAL HIGH (ref 6–20)
CO2: 19 mmol/L — ABNORMAL LOW (ref 22–32)
Calcium: 7.9 mg/dL — ABNORMAL LOW (ref 8.9–10.3)
Chloride: 105 mmol/L (ref 101–111)
Creatinine, Ser: 5.37 mg/dL — ABNORMAL HIGH (ref 0.44–1.00)
GFR calc Af Amer: 9 mL/min — ABNORMAL LOW
GFR calc non Af Amer: 8 mL/min — ABNORMAL LOW
Glucose, Bld: 107 mg/dL — ABNORMAL HIGH (ref 65–99)
Potassium: 3 mmol/L — ABNORMAL LOW (ref 3.5–5.1)
Sodium: 143 mmol/L (ref 135–145)

## 2017-10-28 LAB — PROCALCITONIN: PROCALCITONIN: 6.99 ng/mL

## 2017-10-28 LAB — HEPATITIS PANEL, ACUTE
HEP A IGM: NEGATIVE
HEP B S AG: NEGATIVE
Hep B C IgM: NEGATIVE

## 2017-10-28 LAB — MRSA PCR SCREENING: MRSA BY PCR: POSITIVE — AB

## 2017-10-28 LAB — HEPARIN LEVEL (UNFRACTIONATED)
HEPARIN UNFRACTIONATED: 0.59 [IU]/mL (ref 0.30–0.70)
HEPARIN UNFRACTIONATED: 0.4 [IU]/mL (ref 0.30–0.70)

## 2017-10-28 LAB — LACTIC ACID, PLASMA: Lactic Acid, Venous: 2.1 mmol/L (ref 0.5–1.9)

## 2017-10-28 LAB — VANCOMYCIN, RANDOM: VANCOMYCIN RM: 36

## 2017-10-28 LAB — CK: Total CK: 4627 U/L — ABNORMAL HIGH (ref 38–234)

## 2017-10-28 MED ORDER — CHLORHEXIDINE GLUCONATE CLOTH 2 % EX PADS
6.0000 | MEDICATED_PAD | Freq: Every day | CUTANEOUS | Status: DC
  Administered 2017-10-29 – 2017-11-01 (×3): 6 via TOPICAL

## 2017-10-28 MED ORDER — MUPIROCIN 2 % EX OINT
1.0000 "application " | TOPICAL_OINTMENT | Freq: Two times a day (BID) | CUTANEOUS | Status: DC
  Administered 2017-10-28 – 2017-10-31 (×8): 1 via NASAL
  Filled 2017-10-28 (×2): qty 22

## 2017-10-28 MED ORDER — MORPHINE SULFATE (PF) 4 MG/ML IV SOLN
2.0000 mg | Freq: Four times a day (QID) | INTRAVENOUS | Status: DC | PRN
  Administered 2017-10-28 – 2017-10-30 (×9): 2 mg via INTRAVENOUS
  Filled 2017-10-28 (×10): qty 1

## 2017-10-28 MED ORDER — POTASSIUM CHLORIDE CRYS ER 20 MEQ PO TBCR
40.0000 meq | EXTENDED_RELEASE_TABLET | Freq: Once | ORAL | Status: AC
  Administered 2017-10-28: 40 meq via ORAL
  Filled 2017-10-28: qty 2

## 2017-10-28 MED ORDER — LORAZEPAM 2 MG/ML IJ SOLN
1.0000 mg | Freq: Once | INTRAMUSCULAR | Status: AC
  Administered 2017-10-28: 1 mg via INTRAVENOUS
  Filled 2017-10-28: qty 1

## 2017-10-28 MED ORDER — POTASSIUM CHLORIDE 10 MEQ/100ML IV SOLN
INTRAVENOUS | Status: AC
  Administered 2017-10-28: 10 meq via INTRAVENOUS
  Filled 2017-10-28: qty 100

## 2017-10-28 MED ORDER — POTASSIUM CHLORIDE 10 MEQ/100ML IV SOLN
10.0000 meq | INTRAVENOUS | Status: AC
  Administered 2017-10-28 – 2017-10-29 (×6): 10 meq via INTRAVENOUS
  Filled 2017-10-28 (×4): qty 100

## 2017-10-28 MED ORDER — LEVALBUTEROL HCL 0.63 MG/3ML IN NEBU
0.6300 mg | INHALATION_SOLUTION | Freq: Three times a day (TID) | RESPIRATORY_TRACT | Status: DC
  Administered 2017-10-28 – 2017-10-31 (×12): 0.63 mg via RESPIRATORY_TRACT
  Filled 2017-10-28 (×12): qty 3

## 2017-10-28 MED ORDER — DICYCLOMINE HCL 10 MG PO CAPS
10.0000 mg | ORAL_CAPSULE | Freq: Three times a day (TID) | ORAL | Status: DC
  Administered 2017-10-28 – 2017-10-31 (×10): 10 mg via ORAL
  Filled 2017-10-28 (×18): qty 1

## 2017-10-28 MED ORDER — LABETALOL HCL 5 MG/ML IV SOLN
5.0000 mg | INTRAVENOUS | Status: DC | PRN
  Administered 2017-10-29: 10 mg via INTRAVENOUS
  Filled 2017-10-28: qty 4

## 2017-10-28 MED ORDER — LEVALBUTEROL HCL 0.63 MG/3ML IN NEBU
0.6300 mg | INHALATION_SOLUTION | Freq: Four times a day (QID) | RESPIRATORY_TRACT | Status: DC | PRN
  Administered 2017-10-28: 0.63 mg via RESPIRATORY_TRACT
  Filled 2017-10-28 (×2): qty 3

## 2017-10-28 NOTE — Progress Notes (Signed)
ANTICOAGULATION CONSULT NOTE - Follow Up Consult  Pharmacy Consult for Heparin Indication: History of DVTs  Allergies  Allergen Reactions  . Butalbital-Apap-Caffeine Hives  . Naproxen Hives  . Tylenol [Acetaminophen] Other (See Comments)    Upset stomach    Patient Measurements:   Heparin Dosing Weight:   Vital Signs: Temp: 98.6 F (37 C) (02/15 0343) Temp Source: Oral (02/15 0343) BP: 168/75 (02/15 0500) Pulse Rate: 76 (02/15 0500)  Labs: Recent Labs    11/05/2017 1429 10/15/2017 1451 10/27/17 0345 10/27/17 0816 10/27/17 1742 10/28/17 0155  HGB 18.1* 18.0*  --  16.2*  --  13.1  HCT 48.4* 53.0*  --  44.8  --  36.3  PLT 266  --   --  174  --  150  APTT  --   --   --   --  34  --   LABPROT 14.0  --   --   --   --   --   INR 1.09  --   --   --   --   --   HEPARINUNFRC  --   --   --  <0.10* 0.19* 0.59  CREATININE 5.10* 4.90* 5.07*  --   --  5.37*  CKTOTAL 9,306*  --  1,638*  --   --  4,627*    CrCl cannot be calculated (Unknown ideal weight.).   Medications:  Infusions:  . heparin 1,400 Units/hr (10/28/17 0500)  . piperacillin-tazobactam (ZOSYN)  IV Stopped (10/28/17 0130)  .  sodium bicarbonate (isotonic) infusion in sterile water 100 mL/hr at 10/28/17 0545    Assessment: Patient with heparin level at goal. No heparin issues noted.  Goal of Therapy:  Heparin level 0.3-0.7 units/ml Monitor platelets by anticoagulation protocol: Yes   Plan:  Continue heparin drip at current rate Recheck level at [place], [place] 10/28/2017,5:54 AM

## 2017-10-28 NOTE — Progress Notes (Signed)
RN notified patient is positive for MRSA in the nares.  Order bundle for MRSA treatment and precautions placed per protocol.  Isolation placed on patients door.

## 2017-10-28 NOTE — Progress Notes (Addendum)
[facility] ROUNDING NOTE   Subjective:   This is a 62 year old lady that was found in semi conscious state in her excrement by family. She resides in a motel room and was encephalopathic and hypotensive on presentation. She has a history of alcohol abuse and drinks daily. Their is the possibility of a thoracic aneurysm on report and she has a protein s deficiency and hepatitis C In Feb 2018 , her renal function was in the normal range   On presentation she was hypotensive and responded rapidly to a fluid bolus  Her creatinine was 5.1   and white blood count 30.5 Her CK 9,000  Ammonia was normal and ethanol undetectable  Troponin increased slightly 0.48  Lactate level was 2.2   Chest CT performed 5/45 showed uncomplicated aneurysm 4.2 cm  She has been started on vancomycin and zosyn  Renal function is improved creatinine 4.45  Urine output improved     Will continue to follow   Objective:  Vital signs in last 24 hours:  Temp:  [98.4 F (36.9 C)-99.1 F (37.3 C)] 98.9 F (37.2 C) (02/15 0800) Pulse Rate:  [68-101] 88 (02/15 0902) Resp:  [18-35] 32 (02/15 0902) BP: (70-188)/(48-164) 132/97 (02/15 0902) SpO2:  [84 %-100 %] 96 % (02/15 0902) Weight:  [195 lb 1.7 oz (88.5 kg)] 195 lb 1.7 oz (88.5 kg) (02/15 0600)  Weight change:  Filed Weights   10/28/17 0600  Weight: 195 lb 1.7 oz (88.5 kg)    Intake/Output: I/O last 3 completed shifts: In: 3850.6 [P.O.:840; I.V.:2160.6; Other:700; IV Piggyback:150] Out: -    Intake/Output this shift:  Total I/O In: -  Out: 800 [Urine:375; Stool:425]  CVS- RRR RS- CTA ABD- BS present soft non-distended EXT- no edema   Basic Metabolic Panel: Recent Labs  Lab 10/25/2017 1429 11/05/2017 1451 10/27/17 0345 10/28/17 0155  NA 144 144 145 143  K 2.9* 2.8* 3.4* 3.0*  CL 100* 103 111 105  CO2 18*  --  15* 19*  GLUCOSE 138* 126* 111* 107*  BUN 58* 51* 62* 72*  CREATININE 5.10* 4.90* 5.07* 5.37*  CALCIUM 9.8  --  8.0*  7.9*  MG  --   --  1.9  --   PHOS  --   --  4.2  --     Liver Function Tests: Recent Labs  Lab 11/09/2017 1429 10/27/17 0345  AST 245* 231*  ALT 97* 91*  ALKPHOS 97 69  BILITOT 1.6* 1.5*  PROT 9.4* 6.9  ALBUMIN 4.1 3.0*   No results for input(s): LIPASE, AMYLASE in the last 168 hours. Recent Labs  Lab 10/24/2017 1429  AMMONIA 17    CBC: Recent Labs  Lab 10/28/2017 1429 10/16/2017 1451 10/27/17 0816 10/28/17 0155  WBC 30.5*  --  23.4* 19.5*  NEUTROABS 26.2*  --  19.5* 16.3*  HGB 18.1* 18.0* 16.2* 13.1  HCT 48.4* 53.0* 44.8 36.3  MCV 94.2  --  95.5 94.5  PLT 266  --  174 150    Cardiac Enzymes: Recent Labs  Lab 10/27/2017 1429 10/27/17 0345 10/28/17 0155  CKTOTAL 9,306* 6,917* 4,627*    BNP: Invalid input(s): POCBNP  CBG: Recent Labs  Lab 10/24/2017 1435  GLUCAP 132*    Microbiology: Results for orders placed or performed during the hospital encounter of 11/02/2017  Blood Cultures (routine x 2)     Status: None (Preliminary result)   Collection Time: 11/09/2017  2:32 PM  Result Value Ref Range Status  Specimen Description   Final    BLOOD LEFT ANTECUBITAL Performed at [facility] [address], [place], [place] [address]    Special Requests   Final    BOTTLES DRAWN AEROBIC AND ANAEROBIC Blood Culture adequate volume Performed at [facility] [address], [place], [place] [address]    Culture   Final    NO GROWTH < 24 HOURS Performed at [facility] [address], [place], [place] [address]    Report Status PENDING  Incomplete  Gastrointestinal Panel by PCR , Stool     Status: None   Collection Time: 11/03/2017  7:00 PM  Result Value Ref Range Status   Campylobacter species NOT DETECTED NOT DETECTED Final   Plesimonas shigelloides NOT DETECTED NOT DETECTED Final   Salmonella species NOT DETECTED NOT DETECTED Final   Yersinia enterocolitica NOT DETECTED NOT DETECTED Final   Vibrio species NOT DETECTED  NOT DETECTED Final   Vibrio cholerae NOT DETECTED NOT DETECTED Final   Enteroaggregative E coli (EAEC) NOT DETECTED NOT DETECTED Final   Enteropathogenic E coli (EPEC) NOT DETECTED NOT DETECTED Final   Enterotoxigenic E coli (ETEC) NOT DETECTED NOT DETECTED Final   Shiga like toxin producing E coli (STEC) NOT DETECTED NOT DETECTED Final   Shigella/Enteroinvasive E coli (EIEC) NOT DETECTED NOT DETECTED Final   Cryptosporidium NOT DETECTED NOT DETECTED Final   Cyclospora cayetanensis NOT DETECTED NOT DETECTED Final   Entamoeba histolytica NOT DETECTED NOT DETECTED Final   Giardia lamblia NOT DETECTED NOT DETECTED Final   Adenovirus F40/41 NOT DETECTED NOT DETECTED Final   Astrovirus NOT DETECTED NOT DETECTED Final   Norovirus GI/GII NOT DETECTED NOT DETECTED Final   Rotavirus A NOT DETECTED NOT DETECTED Final   Sapovirus (I, II, IV, and V) NOT DETECTED NOT DETECTED Final    Comment: Performed at [facility], [address] [place], [place] [address]  C difficile quick scan Garcia PCR reflex     Status: None   Collection Time: 10/25/2017  7:00 PM  Result Value Ref Range Status   C Diff antigen NEGATIVE NEGATIVE Final   C Diff toxin NEGATIVE NEGATIVE Final   C Diff interpretation No C. difficile detected.  Final    Comment: Performed at [facility], [place] [address], [place], [place] [address]    Coagulation Studies: Recent Labs    11/03/2017 1429  LABPROT 14.0  INR 1.09    Urinalysis: Recent Labs    10/27/17 1502  COLORURINE AMBER*  LABSPEC 1.019  PHURINE 5.0  GLUCOSEU NEGATIVE  HGBUR LARGE*  BILIRUBINUR NEGATIVE  KETONESUR NEGATIVE  PROTEINUR 100*  NITRITE NEGATIVE  LEUKOCYTESUR NEGATIVE      Imaging: Ct Abdomen Pelvis Wo Contrast  Result Date: 10/27/2017 CLINICAL DATA:  Found on the floor. Altered mental status, diarrhea, acute renal failure, severe leukocytosis EXAM: CT ABDOMEN AND PELVIS WITHOUT CONTRAST TECHNIQUE: Multidetector CT imaging  of the abdomen and pelvis was performed following the standard protocol without IV contrast. COMPARISON:  05/04/2012 FINDINGS: Lower chest: Left lower lobe atelectasis. Calcific atherosclerotic disease of the aorta. Hepatobiliary: Normal appearance of the liver. 2.7 cm calculus within the dependent portion of the gallbladder. No secondary signs of acute cholecystitis. Pancreas: Unremarkable. No pancreatic ductal dilatation or surrounding inflammatory changes. Spleen: Normal in size without focal abnormality. Adrenals/Urinary Tract: Adrenal glands are unremarkable. Kidneys are normal, without renal calculi, focal lesion, or hydronephrosis. Bladder is unremarkable. Stomach/Bowel: Fluid-filled small and large bowel  loops with upper limits of normal caliber of the small bowels. No transitional point seen. Featureless appearance of the colon usually seen with colitis. Stool retention device in the rectum. Vascular/Lymphatic: Aortic atherosclerosis. No enlarged abdominal or pelvic lymph nodes. Reproductive: Uterus and bilateral adnexa are unremarkable. Other: No abdominal wall hernia or abnormality. No abdominopelvic ascites. Musculoskeletal: No acute osseous findings. Multilevel osteoarthritic changes of the lumbosacral spine. IMPRESSION: 2.7 cm calculus within the dependent portion of the gallbladder. No CT evidence of acute cholecystitis on this motion degraded exam. Pan enterocolitis with evidence of diarrheal state. Calcific atherosclerotic disease and tortuosity of the aorta. Electronically Signed   By: Fidela Salisbury M.D.   On: 10/27/2017 14:43   Dg Chest 2 View  Result Date: 10/28/2017 CLINICAL DATA:  Fall. EXAM: CHEST  2 VIEW COMPARISON:  CT chest dated October 05, 2017. Chest x-ray dated May 16, 2015. FINDINGS: Stable cardiomediastinal silhouette. Normal pulmonary vascularity. Atherosclerotic calcification of the aortic arch. No focal consolidation, pleural effusion, or pneumothorax. No acute  osseous abnormality. IMPRESSION: No active cardiopulmonary disease. Electronically Signed   By: Titus Dubin M.D.   On: 11/09/2017 16:25   Dg Pelvis 1-2 Views  Result Date: 11/05/2017 CLINICAL DATA:  Recent fall EXAM: PELVIS - 1-2 VIEW COMPARISON:  None. FINDINGS: Pelvic ring is intact. No acute fracture or dislocation is seen. Degenerative changes of lumbar spine are noted. No soft tissue abnormality is noted. IMPRESSION: No acute abnormality seen. Electronically Signed   By: Inez Catalina M.D.   On: 10/16/2017 16:24   Ct Head Wo Contrast  Result Date: 11/05/2017 CLINICAL DATA:  Altered mental status and recent fall EXAM: CT HEAD WITHOUT CONTRAST CT CERVICAL SPINE WITHOUT CONTRAST TECHNIQUE: Multidetector CT imaging of the head and cervical spine was performed following the standard protocol without intravenous contrast. Multiplanar CT image reconstructions of the cervical spine were also generated. COMPARISON:  None. FINDINGS: CT HEAD FINDINGS Brain: No evidence of acute infarction, hemorrhage, hydrocephalus, extra-axial collection or mass lesion/mass effect. Vascular: No hyperdense vessel or unexpected calcification. Skull: Normal. Negative for fracture or focal lesion. Sinuses/Orbits: No acute finding. Other: None. CT CERVICAL SPINE FINDINGS Alignment: Within normal limits. Skull base and vertebrae: 7 cervical segments are well visualized. Vertebral body height is well maintained. Facet hypertrophic changes are noted throughout the cervical spine. No acute fracture or acute facet abnormality is noted. Osteophytic changes are noted most marked at C5-6 and C6-7. Soft tissues and spinal canal: Vascular calcifications are noted. No acute soft tissue abnormality is noted. Upper chest: Visualized upper chest is unremarkable. Other: None IMPRESSION: CT of the head: No acute intracranial abnormality noted. CT of the cervical spine: Multilevel degenerative change without acute abnormality. Electronically Signed    By: Inez Catalina M.D.   On: 11/04/2017 16:21   Ct Cervical Spine Wo Contrast  Result Date: 10/29/2017 CLINICAL DATA:  Altered mental status and recent fall EXAM: CT HEAD WITHOUT CONTRAST CT CERVICAL SPINE WITHOUT CONTRAST TECHNIQUE: Multidetector CT imaging of the head and cervical spine was performed following the standard protocol without intravenous contrast. Multiplanar CT image reconstructions of the cervical spine were also generated. COMPARISON:  None. FINDINGS: CT HEAD FINDINGS Brain: No evidence of acute infarction, hemorrhage, hydrocephalus, extra-axial collection or mass lesion/mass effect. Vascular: No hyperdense vessel or unexpected calcification. Skull: Normal. Negative for fracture or focal lesion. Sinuses/Orbits: No acute finding. Other: None. CT CERVICAL SPINE FINDINGS Alignment: Within normal limits. Skull base and vertebrae: 7 cervical segments are well visualized. Vertebral body  height is well maintained. Facet hypertrophic changes are noted throughout the cervical spine. No acute fracture or acute facet abnormality is noted. Osteophytic changes are noted most marked at C5-6 and C6-7. Soft tissues and spinal canal: Vascular calcifications are noted. No acute soft tissue abnormality is noted. Upper chest: Visualized upper chest is unremarkable. Other: None IMPRESSION: CT of the head: No acute intracranial abnormality noted. CT of the cervical spine: Multilevel degenerative change without acute abnormality. Electronically Signed   By: Inez Catalina M.D.   On: 10/24/2017 16:21   US Abdomen Limited Ruq  Result Date: 10/28/2017 CLINICAL DATA:  Gallbladder stone with non acute cholecystitis. EXAM: ULTRASOUND ABDOMEN LIMITED RIGHT UPPER QUADRANT COMPARISON:  CT abdomen and pelvis 10/27/2017 FINDINGS: Gallbladder: A 2.6 cm shadowing stone is present at the neck of the gallbladder. The gallbladder is contracted. Wall is within normal limits at 3.5 mm. There is no sonographic Murphy sign. Common  bile duct: Diameter: 4.6 mm, within normal limits Liver: The liver is diffusely echogenic. No focal lesions are present. The contour is smooth. Portal vein is patent on color Doppler imaging with normal direction of blood flow towards the liver. IMPRESSION: 1. 2.6 cm shadowing gallstone without evidence for cholecystitis. 2. Hepatic steatosis. Electronically Signed   By: San Morelle M.D.   On: 10/28/2017 11:02     Medications:   . heparin 1,400 Units/hr (10/28/17 0600)  . piperacillin-tazobactam (ZOSYN)  IV Stopped (10/28/17 0730)  . potassium chloride Stopped (10/28/17 1159)  .  sodium bicarbonate (isotonic) infusion in sterile water 100 mL/hr at 10/28/17 0600   . fentaNYL (SUBLIMAZE) injection  50 mcg Intravenous Once  . levalbuterol  0.63 mg Nebulization TID  . nicotine  14 mg Transdermal Daily  . thiamine  100 mg Oral Daily   iopamidol, labetalol, levalbuterol, LORazepam  Assessment/ Plan:   Acute renal failure  The etiology is a little puzzling but could be explained by some hypotension and ischemic ATN  CT that has showed no hydronephrosis. No evidence of AGN or AIN   She has enterocolitis on CT   Volume appears to be euvolemic at present and is being given IV fluids  She has a mild metabolic acidosis and is receiving IV bicarbonate at this time  Hypokalemia  Replete with IV fluids   Creatinine is improved this morning with improved urine output       LOS: 2 Brooke Garcia @TODAY @12 :22 PM

## 2017-10-28 NOTE — Progress Notes (Signed)
Pharmacy Antibiotic Note  Brooke Garcia is a 62 y.o. female admitted on 10/14/2017 with sepsis.  Pharmacy has been consulted for Vancomycin dosing.  Plan: This patient's current antibiotics will be continued without adjustments.---Vancomycin random level still > 20, will recheck level 2/16 at 1200.  Weight: 195 lb 1.7 oz (88.5 kg)  Temp (24hrs), Avg:98.6 F (37 C), Min:98 F (36.7 C), Max:99.1 F (37.3 C)  Recent Labs  Lab 10/15/2017 1429 11/03/2017 1451 11/05/2017 1736 10/27/17 0050 10/27/17 0345 10/27/17 0816 10/28/17 0155  WBC 30.5*  --   --   --   --  23.4* 19.5*  CREATININE 5.10* 4.90*  --   --  5.07*  --  5.37*  LATICACIDVEN  --  5.79* 3.81* 2.6* 2.2*  --  2.1*  VANCORANDOM  --   --   --   --  53*  --  36    Estimated Creatinine Clearance: 11.6 mL/min (A) (by C-G formula based on SCr of 5.37 mg/dL (H)).    Allergies  Allergen Reactions  . Butalbital-Apap-Caffeine Hives  . Naproxen Hives  . Tylenol [Acetaminophen] Other (See Comments)    Upset stomach    Antimicrobials this admission: 2/13 vancomycin >> 2/13 zosyn >>  Dose adjustments this admission: 2/14 VR = 53 (~4hr after 2g dose), SCr up to 5 so will recheck VR 2/15 AM 2/15 VR = 36 ---not less than 20, will recheck level in > 24hr 2/16 VR @ 1200 __________  Microbiology results: 2/13 C. Difficile: neg 2/13 GI panel: 2/13 BCx:  2/13 UCx:  Thank you for allowing pharmacy to be a part of this patient's care.  Nani Skillern Crowford 10/28/2017 6:49 AM

## 2017-10-28 NOTE — Progress Notes (Signed)
PULMONARY / CRITICAL CARE MEDICINE   Name: Brooke Garcia MRN: [id number] DOB: 1956-01-09    ADMISSION DATE:  10/14/2017 CONSULTATION DATE:  10/30/2017  REFERRING MD: Dr. Tawanna Solo  CHIEF COMPLAINT:  Altered Mental Status  HISTORY OF PRESENT ILLNESS:  62 year old female found down in a hotel room in an altered mental state laying in her own stool and urine. She was brought to the ED for evaluation and suspected to have rhabdomyolysis. She has a past medical history significant for alcohol and tobacco abuse and reportedly on xarelto for DVTs.   Initial labs showed a lactic acid of 5.79, elevated CK of 9,306, elevated liver enzymes, hypokalemia of 2.9 and AKI with Cr of 5.10 and BUN of 58. ED imaging was negative for acute processes of the head, chest but showed some evidence of acute cholecystitis on CT abdomen. She was admitted for further management.   SUBJECTIVE:  Pt reports feeling better.  Frustrated that she can not remember her phone pin.  Asking if she is going to have surgery.    VITAL SIGNS: BP (!) 151/48   Pulse 68   Temp 98.6 F (37 C) (Oral)   Resp (!) 28   Wt 195 lb 1.7 oz (88.5 kg)   SpO2 97%   BMI 34.56 kg/m          INTAKE / OUTPUT: I/O last 3 completed shifts: In: 3850.6 [P.O.:840; I.V.:2160.6; Other:700; IV Piggyback:150] Out: -   PHYSICAL EXAMINATION: General:  disheveled adult female in NAD, sitting up in bed Neuro: Awake, alert, oriented, MAE HEENT: mm pink/moist  Cardiovascular: s1s2 rrr, no m/r/g Lungs: even/non-labored on RA, diminished bases, clear otherwise  Abdomen: soft, round Skin: multiple scattered bruises on LE's, erythema to bilateral knees (? Fall)  LABS:  BMET Recent Labs  Lab 10/30/2017 1429 10/21/2017 1451 10/27/17 0345 10/28/17 0155  NA 144 144 145 143  K 2.9* 2.8* 3.4* 3.0*  CL 100* 103 111 105  CO2 18*  --  15* 19*  BUN 58* 51* 62* 72*  CREATININE 5.10* 4.90* 5.07* 5.37*  GLUCOSE 138* 126* 111* 107*     Electrolytes Recent Labs  Lab 10/28/2017 1429 10/27/17 0345 10/28/17 0155  CALCIUM 9.8 8.0* 7.9*  MG  --  1.9  --   PHOS  --  4.2  --     CBC Recent Labs  Lab 10/29/2017 1429 10/16/2017 1451 10/27/17 0816 10/28/17 0155  WBC 30.5*  --  23.4* 19.5*  HGB 18.1* 18.0* 16.2* 13.1  HCT 48.4* 53.0* 44.8 36.3  PLT 266  --  174 150    Coag's Recent Labs  Lab 10/23/2017 1429 10/27/17 1742  APTT  --  34  INR 1.09  --     Sepsis Markers Recent Labs  Lab 10/27/17 0050 10/27/17 0345 10/28/17 0155  LATICACIDVEN 2.6* 2.2* 2.1*    ABG Recent Labs  Lab 10/25/2017 1430 10/28/2017 1920  PHART 7.476* 7.421  PCO2ART below reportable range 21.6*  PO2ART 75.8* 85.7    Liver Enzymes Recent Labs  Lab 11/09/2017 1429 10/27/17 0345  AST 245* 231*  ALT 97* 91*  ALKPHOS 97 69  BILITOT 1.6* 1.5*  ALBUMIN 4.1 3.0*    Cardiac Enzymes No results for input(s): TROPONINI, PROBNP in the last 168 hours.  Glucose Recent Labs  Lab 10/20/2017 1435  GLUCAP 132*    Imaging Ct Abdomen Pelvis Wo Contrast  Result Date: 10/27/2017 CLINICAL DATA:  Found on the floor. Altered mental status, diarrhea, acute  renal failure, severe leukocytosis EXAM: CT ABDOMEN AND PELVIS WITHOUT CONTRAST TECHNIQUE: Multidetector CT imaging of the abdomen and pelvis was performed following the standard protocol without IV contrast. COMPARISON:  05/04/2012 FINDINGS: Lower chest: Left lower lobe atelectasis. Calcific atherosclerotic disease of the aorta. Hepatobiliary: Normal appearance of the liver. 2.7 cm calculus within the dependent portion of the gallbladder. No secondary signs of acute cholecystitis. Pancreas: Unremarkable. No pancreatic ductal dilatation or surrounding inflammatory changes. Spleen: Normal in size without focal abnormality. Adrenals/Urinary Tract: Adrenal glands are unremarkable. Kidneys are normal, without renal calculi, focal lesion, or hydronephrosis. Bladder is unremarkable. Stomach/Bowel:  Fluid-filled small and large bowel loops with upper limits of normal caliber of the small bowels. No transitional point seen. Featureless appearance of the colon usually seen with colitis. Stool retention device in the rectum. Vascular/Lymphatic: Aortic atherosclerosis. No enlarged abdominal or pelvic lymph nodes. Reproductive: Uterus and bilateral adnexa are unremarkable. Other: No abdominal wall hernia or abnormality. No abdominopelvic ascites. Musculoskeletal: No acute osseous findings. Multilevel osteoarthritic changes of the lumbosacral spine. IMPRESSION: 2.7 cm calculus within the dependent portion of the gallbladder. No CT evidence of acute cholecystitis on this motion degraded exam. Pan enterocolitis with evidence of diarrheal state. Calcific atherosclerotic disease and tortuosity of the aorta. Electronically Signed   By: Fidela Salisbury M.D.   On: 10/27/2017 14:43    STUDIES: CT Head 2/13 >> negative  CT Cervical Spine 2/13 >> multilevel degenerative changes, no acute CT ABD/Pelvis 2/13 >> 2.7 cm calculus within the dependent portion of the gallbladder, no evidence for acute cholecystitis, pan enterocolitis  CULTURES: BCx2 2/13 >>  GI PCR 2/13 >> negative  ANTIBIOTICS: Zosyn 2/13 >>    LINES/TUBES:   ASSESSMENT / PLAN:  NEUROLOGY: A:  Acute Metabolic Encephalopathy - resolving  ? Alcohol withdrawl P: CIWA protocol, PRN ativan  Supportive care, improving   RENAL: A: AKI - suspect hypoperfusion event Rhabdomyolysis - initial CK 9,306 Hypokalemia P: Per Nephrology  No indication for acute HD at this time  Ensure adequate renal perfusion  Avoid nephrotoxic agents   INFECTIOUS: A: SIRS Diarrhea - doubt infectious etiology, suspect ischemic insult from hypotension  P: Trend CBC / fever curve  Monitor diarrhea output  Per Primary   HEMATOLOGY: A: Hypercoagulable state with hx of Protein S Deficiency Hx DVT  P: Heparin per pharmacy  Defer transition back to  oral agents to primary    PCCM will be available PRN.  Please call back if new needs arise.   Noe Gens, NP-C [facility] Pgr: [phone] or if no answer [phone] 10/28/2017, 11:18 AM

## 2017-10-28 NOTE — Consult Note (Signed)
Consultation  Referring Provider:   Dr. Tawanna Solo   Primary Care Physician:  Janith Lima, MD Primary Gastroenterologist:     Dr. Hilarie Fredrickson    Reason for Consultation:   Diarrhea, Abnormal CT Abdomen        Impression / Plan:   Impression: 1. Diarrhea: C diff and GI path panel negative, with a recent episode of hypotension most likely this is related to ischemic colitis 2. Abnormal CT abdomen/pelvis W/o contrast: as above showing pan enterocolitis 3. AKI on CKD: not improving Crea 5.1-->5.37, nephrology following 4. Possible sepsis:wbc improving on broad spectrum abx 30.5-->19.5 5. EQA:STMHDQQI 6. Fall/rhabdomyolysis 7. Alcohol abuse 8.  Hepatitis C: Patient tells that she has never sought treatment for this in the past, though now currently using alcohol and may not be a candidate for treatment unless she is 6 months sober 9.  Protein S deficiency: On Xarelto outpatient, currently on a heparin drip  Plan: 1.  Continue supportive measures 2.  No plans for colonoscopy 3.  Can increase to a low fiber/low residue diet 4.  Please await final recommendations from Dr. Carlean Purl later today  Thank you for your kind consultation, we will continue to follow.  [name]  10/28/2017, 10:25 AM Pager #: [phone]    [facility] Attending   I have taken an interval history, reviewed the chart and examined the patient. I agree with the Advanced Practitioner's note, impression and recommendations.   All signs point towards ischemic injury to bowels and other organs. Supportive care - if fails to improve will reconsider endoscopic evaluation Gallstone is not a problem   Gatha Mayer, MD, [facility] [phone] (pager) 10/28/2017 11:25 AM        HPI:   Brooke Garcia is a 62 y.o. female with a past medical history significant for alcohol abuse, PE, protein S deficiency, COPD, hypertension, CKD, hepatitis C, chronic ischemic colitis and  thoracoabdominal aneurysm, who initially presented in the ER on 11/09/2017 after being found in a hotel room on the ground semiconscious in her own urine and feces.    Hospital course: She was found to be encephalopathic with a BUN greater than 50 creatinine close to 5 this was thought possibly related to uremia, mental status is now improved.  Family reported history of daily alcohol use.  Patient is currently on pressors with a heart rate of 80-90.  She has an elevated white count and possible UTI and is on broad-spectrum antibiotics.  Potassium is being replaced.  She is on [place] protocol for possible alcohol withdrawal.  Patient has a history of protein S deficiency and has been on anticoagulation in the recent past with possibly Xarelto, this is being held and she is now on IV heparin.  Patient also has diarrhea, C. difficile and GI pathogen panel were negative from rectal tube, continues to complain of generalized abdominal tenderness.   CT abdomen pelvis without contrast on 10/27/17 showed 2.7 cm calculus within the dependent portion of the gallbladder, no CT evidence of acute cholecystitis on the motion degraded exam, pan enterocolitis with evidence of diarrheal state and calcific atherosclerotic disease and tortuosity of the aorta.     We were consulted today in regards to diarrhea and an abnormal CT of the abdomen.       Today, the patient is found sitting up in her bed.  She tells [place] that she was not having diarrhea prior to admission.  Was experiencing abdominal pain,  typically in the right upper quadrant, she blames this now on a finding of gallstone on recent CT.  Patient knows she was found unresponsive on the floor of a hotel room.      She admits to daily alcohol use and abuse of pain medicine in the past.  Patient tells [place] that she is ready to clean up and has plans to stay with her son after discharge to avoid drug use in the future.    Patient denies a prior heartburn, reflux, nausea or  vomiting.  Previous GI history: 07/04/13-colonoscopy, Dr. Hilarie Fredrickson: Done for abdominal pain in the left lower quadrant, history of ischemic colitis and diarrhea; impression: Normal colon, scarring in the right colon and short segment of sigmoid colon felt secondary to healed ischemic colitis, no active areas of ischemia at present; plan: Levsin as needed for abdominal cramping pain and Lomotil continued for loose stools  Past Medical History:  Diagnosis Date  . Acute venous embolism and thrombosis of unspecified deep vessels of lower extremity   . Anxiety state, unspecified   . Arthritis   . Blood dyscrasia    protein def.  . Calculus of kidney   . Colitis   . Complete rupture of rotator cuff   . COPD (chronic obstructive pulmonary disease) ([place])   . Degeneration of intervertebral disc, site unspecified   . Depression   . DVT (deep venous thrombosis) ([place])   . Family history of ischemic heart disease   . History of gallstones   . HLD (hyperlipidemia)   . HTN (hypertension)   . Other and unspecified noninfectious gastroenteritis and colitis(558.9)   . Painful respiration   . Primary hypercoagulable state ([place])   . Protein S deficiency ([place])   . Renal failure   . Tobacco use disorder   . Unspecified urinary incontinence   . Venous stasis ulcers ([facility])    left leg    Past Surgical History:  Procedure Laterality Date  . COLONOSCOPY  04/27/2012   Procedure: COLONOSCOPY;  Surgeon: Jerene Bears, MD;  Location: [facility];  Service: Gastroenterology;  Laterality: N/A;  . ENDOVENOUS ABLATION SAPHENOUS VEIN W/ Garcia Left 07-04-2014   EVLA left greater saphenous vein by Curt Jews MD  . ENDOVENOUS ABLATION SAPHENOUS VEIN W/ Garcia Right 07-17-2014   EVLA roght greater saphenous vein by Curt Jews MD  . NASAL SINUS SURGERY    . ROTATOR CUFF REPAIR     right  . THROMBECTOMY / EMBOLECTOMY FEMORAL ARTERY     DVT  . TUBAL LIGATION    . tubal ligation reversal      Family History    Problem Relation Age of Onset  . Breast cancer Mother        mets to liver  . Stroke Mother   . Heart attack Father   . Lung cancer Father   . Other Father        Protein S Deficiency  . Clotting disorder Father   . Diabetes Father   . Heart disease Father   . Cancer Father        Lung  . Hypertension Maternal Grandmother   . Colon cancer Maternal Grandmother   . Heart disease Maternal Grandmother   . Heart attack Paternal Grandmother   . Tuberculosis Paternal Grandmother   . Diabetes Sister   . Alzheimer's disease Maternal Grandfather   . Hypertension Brother   . Ulcers Brother   . Stomach cancer Neg Hx      Social  History   Tobacco Use  . Smoking status: Current Every Day Smoker    Packs/day: 0.50    Years: 30.00    Pack years: 15.00    Types: Cigarettes  . Smokeless tobacco: Never Used  . Tobacco comment: 0.5 packs per day  Substance Use Topics  . Alcohol use: No  . Drug use: No    Prior to Admission medications   Medication Sig Start Date End Date Taking? Authorizing Provider  ALPRAZolam Duanne Moron) 1 MG tablet Take 1 tablet (1 mg total) by mouth 2 (two) times daily as needed for anxiety. 06/09/17   Janith Lima, MD  arformoterol (BROVANA) 15 MCG/2ML NEBU Take 2 mLs (15 mcg total) by nebulization 2 (two) times daily. 09/08/17   Janith Lima, MD  chlorthalidone (HYGROTON) 25 MG tablet TAKE 1 TABLET(25 MG) BY MOUTH DAILY 07/20/17   Janith Lima, MD  Glycopyrrolate [facility] REFILL KIT) 25 MCG/ML SOLN Inhale 1 Act into the lungs 2 (two) times daily. 09/08/17   Janith Lima, MD  irbesartan (AVAPRO) 300 MG tablet TAKE 1 TABLET(300 MG) BY MOUTH DAILY 09/08/17   Janith Lima, MD  [facility] MAGNAIR STARTER KIT 25 MCG/ML SOLN Use one vial via Magnair device twice a day. Generic: Glycopyrrolate 09/22/17   Janith Lima, MD  oxyCODONE (ROXICODONE) 15 MG immediate release tablet Take 1 tablet (15 mg total) by mouth every 4 (four) hours as needed for pain.  10/06/17   Janith Lima, MD  pantoprazole (PROTONIX) 40 MG tablet TAKE 1 TABLET(40 MG) BY MOUTH DAILY 06/09/17   Janith Lima, MD  Rivaroxaban (XARELTO) 15 MG TABS tablet TAKE 1 TABLET(15 MG) BY MOUTH DAILY WITH SUPPER 06/09/17   Janith Lima, MD  thiamine (VITAMIN B-1) 100 MG tablet Take 1 tablet (100 mg total) by mouth daily. 09/12/17   Janith Lima, MD    Current Facility-Administered Medications  Medication Dose Route Frequency Provider Last Rate Last Dose  . fentaNYL (SUBLIMAZE) injection 50 mcg  50 mcg Intravenous Once Deterding, Guadelupe Sabin, MD      . heparin ADULT infusion 100 units/mL (25000 units/279m sodium chloride 0.45%)  1,400 Units/hr Intravenous Continuous JAngela Adam RPH 14 mL/hr at 10/28/17 0600 1,400 Units/hr at 10/28/17 0600  . iopamidol (ISOVUE-300) 61 % injection 15 mL  15 mL Oral BID PRN AJodie Echevaria Amrit, MD   15 mL at 10/27/17 1011  . labetalol (NORMODYNE,TRANDATE) injection 5-10 mg  5-10 mg Intravenous Q2H PRN Opyd, TIlene Qua MD      . levalbuterol (XOPENEX) nebulizer solution 0.63 mg  0.63 mg Nebulization TID AMarene Lenz MD   0.63 mg at 10/28/17 0903  . levalbuterol (XOPENEX) nebulizer solution 0.63 mg  0.63 mg Nebulization Q6H PRN AJodie Echevaria Amrit, MD   0.63 mg at 10/28/17 0132  . LORazepam (ATIVAN) injection 1-2 mg  1-2 mg Intravenous Q3H PRN RTarry Kos MD      . nicotine (NICODERM CQ - dosed in mg/24 hours) patch 14 mg  14 mg Transdermal Daily RTarry Kos MD   14 mg at 10/28/17 1011  . piperacillin-tazobactam (ZOSYN) IVPB 2.25 g  2.25 g Intravenous Q6H ZBerton Mount RPH   Stopped at 10/28/17 0730  . potassium chloride 10 mEq in 100 mL IVPB  10 mEq Intravenous Q1 Hr x 5 Adhikari Bk, Amrit, MD 100 mL/hr at 10/28/17 1010 10 mEq at 10/28/17 1010  . sodium bicarbonate 150 mEq in sterile water  1,000 mL infusion   Intravenous Continuous Tarry Kos, MD 100 mL/hr at 10/28/17 0600    . thiamine (VITAMIN B-1)  tablet 100 mg  100 mg Oral Daily Jodie Echevaria, Amrit, MD   100 mg at 10/28/17 1012    Allergies as of 10/20/2017 - Review Complete 10/24/2017  Allergen Reaction Noted  . Butalbital-apap-caffeine Hives 04/30/2010  . Naproxen Hives   . Tylenol [acetaminophen] Other (See Comments) 08/10/2011     Review of Systems:    Constitutional: No fever or chills Skin: No rash  Cardiovascular: No chest pain Respiratory: No SOB  Gastrointestinal: See HPI and otherwise negative Genitourinary: No dysuria Neurological: +syncope Musculoskeletal: +joint and muscle pain Hematologic: No bleeding  Psychiatric: +depression   Physical Exam:  Vital signs in last 24 hours: Temp:  [98 F (36.7 C)-99.1 F (37.3 C)] 98.9 F (37.2 C) (02/15 0800) Pulse Rate:  [68-101] 88 (02/15 0902) Resp:  [18-35] 32 (02/15 0902) BP: (70-188)/(48-164) 132/97 (02/15 0902) SpO2:  [84 %-100 %] 96 % (02/15 0902) Weight:  [195 lb 1.7 oz (88.5 kg)] 195 lb 1.7 oz (88.5 kg) (02/15 0600) Last BM Date: 10/27/17 General:   Pleasant Caucasian female appears to be in NAD, Well developed, Well nourished, alert and cooperative Head:  Normocephalic and atraumatic. Eyes:   PEERL, EOMI. No icterus. Conjunctiva pink. Ears:  Normal auditory acuity. Neck:  Supple Throat: Oral cavity and pharynx without inflammation, swelling or lesion.  Lungs: Respirations even and unlabored. Lungs clear to auscultation bilaterally.   No wheezes, crackles, or rhonchi.  Heart: Normal S1, S2. No MRG. Regular rate and rhythm. No peripheral edema, cyanosis or pallor.  Abdomen:  Soft, nondistended, mild generalized ttp, No rebound or guarding. Normal bowel sounds. No appreciable masses or hepatomegaly. Rectal: Rectal tube in place, drainage of dark liquid stool Msk:  Symmetrical without gross deformities. Peripheral pulses intact.  Extremities:  Without edema, no deformity or joint abnormality.  Neurologic:  Alert and  oriented x4;  grossly normal  neurologically.  Skin:   Dry and intact without significant lesions or rashes. Psychiatric:  Demonstrates good judgement and reason without abnormal affect or behaviors.   LAB RESULTS: Recent Labs    10/23/2017 1429 10/19/2017 1451 10/27/17 0816 10/28/17 0155  WBC 30.5*  --  23.4* 19.5*  HGB 18.1* 18.0* 16.2* 13.1  HCT 48.4* 53.0* 44.8 36.3  PLT 266  --  174 150   BMET Recent Labs    10/20/2017 1429 10/18/2017 1451 10/27/17 0345 10/28/17 0155  NA 144 144 145 143  K 2.9* 2.8* 3.4* 3.0*  CL 100* 103 111 105  CO2 18*  --  15* 19*  GLUCOSE 138* 126* 111* 107*  BUN 58* 51* 62* 72*  CREATININE 5.10* 4.90* 5.07* 5.37*  CALCIUM 9.8  --  8.0* 7.9*   LFT Recent Labs    10/27/17 0345  PROT 6.9  ALBUMIN 3.0*  AST 231*  ALT 91*  ALKPHOS 69  BILITOT 1.5*   PT/INR Recent Labs    10/16/2017 1429  LABPROT 14.0  INR 1.09    STUDIES: Ct Abdomen Pelvis Wo Contrast  Result Date: 10/27/2017 CLINICAL DATA:  Found on the floor. Altered mental status, diarrhea, acute renal failure, severe leukocytosis EXAM: CT ABDOMEN AND PELVIS WITHOUT CONTRAST TECHNIQUE: Multidetector CT imaging of the abdomen and pelvis was performed following the standard protocol without IV contrast. COMPARISON:  05/04/2012 FINDINGS: Lower chest: Left lower lobe atelectasis. Calcific atherosclerotic disease of the aorta. Hepatobiliary: Normal appearance of  the liver. 2.7 cm calculus within the dependent portion of the gallbladder. No secondary signs of acute cholecystitis. Pancreas: Unremarkable. No pancreatic ductal dilatation or surrounding inflammatory changes. Spleen: Normal in size without focal abnormality. Adrenals/Urinary Tract: Adrenal glands are unremarkable. Kidneys are normal, without renal calculi, focal lesion, or hydronephrosis. Bladder is unremarkable. Stomach/Bowel: Fluid-filled small and large bowel loops with upper limits of normal caliber of the small bowels. No transitional point seen. Featureless  appearance of the colon usually seen with colitis. Stool retention device in the rectum. Vascular/Lymphatic: Aortic atherosclerosis. No enlarged abdominal or pelvic lymph nodes. Reproductive: Uterus and bilateral adnexa are unremarkable. Other: No abdominal wall hernia or abnormality. No abdominopelvic ascites. Musculoskeletal: No acute osseous findings. Multilevel osteoarthritic changes of the lumbosacral spine. IMPRESSION: 2.7 cm calculus within the dependent portion of the gallbladder. No CT evidence of acute cholecystitis on this motion degraded exam. Pan enterocolitis with evidence of diarrheal state. Calcific atherosclerotic disease and tortuosity of the aorta. Electronically Signed   By: Fidela Salisbury M.D.   On: 10/27/2017 14:43   Dg Chest 2 View  Result Date: 10/16/2017 CLINICAL DATA:  Fall. EXAM: CHEST  2 VIEW COMPARISON:  CT chest dated October 05, 2017. Chest x-ray dated May 16, 2015. FINDINGS: Stable cardiomediastinal silhouette. Normal pulmonary vascularity. Atherosclerotic calcification of the aortic arch. No focal consolidation, pleural effusion, or pneumothorax. No acute osseous abnormality. IMPRESSION: No active cardiopulmonary disease. Electronically Signed   By: Titus Dubin M.D.   On: 10/22/2017 16:25   Dg Pelvis 1-2 Views  Result Date: 10/30/2017 CLINICAL DATA:  Recent fall EXAM: PELVIS - 1-2 VIEW COMPARISON:  None. FINDINGS: Pelvic ring is intact. No acute fracture or dislocation is seen. Degenerative changes of lumbar spine are noted. No soft tissue abnormality is noted. IMPRESSION: No acute abnormality seen. Electronically Signed   By: Inez Catalina M.D.   On: 10/17/2017 16:24   Ct Head Wo Contrast  Result Date: 10/25/2017 CLINICAL DATA:  Altered mental status and recent fall EXAM: CT HEAD WITHOUT CONTRAST CT CERVICAL SPINE WITHOUT CONTRAST TECHNIQUE: Multidetector CT imaging of the head and cervical spine was performed following the standard protocol without  intravenous contrast. Multiplanar CT image reconstructions of the cervical spine were also generated. COMPARISON:  None. FINDINGS: CT HEAD FINDINGS Brain: No evidence of acute infarction, hemorrhage, hydrocephalus, extra-axial collection or mass lesion/mass effect. Vascular: No hyperdense vessel or unexpected calcification. Skull: Normal. Negative for fracture or focal lesion. Sinuses/Orbits: No acute finding. Other: None. CT CERVICAL SPINE FINDINGS Alignment: Within normal limits. Skull base and vertebrae: 7 cervical segments are well visualized. Vertebral body height is well maintained. Facet hypertrophic changes are noted throughout the cervical spine. No acute fracture or acute facet abnormality is noted. Osteophytic changes are noted most marked at C5-6 and C6-7. Soft tissues and spinal canal: Vascular calcifications are noted. No acute soft tissue abnormality is noted. Upper chest: Visualized upper chest is unremarkable. Other: None IMPRESSION: CT of the head: No acute intracranial abnormality noted. CT of the cervical spine: Multilevel degenerative change without acute abnormality. Electronically Signed   By: Inez Catalina M.D.   On: 11/07/2017 16:21   Ct Cervical Spine Wo Contrast  Result Date: 10/19/2017 CLINICAL DATA:  Altered mental status and recent fall EXAM: CT HEAD WITHOUT CONTRAST CT CERVICAL SPINE WITHOUT CONTRAST TECHNIQUE: Multidetector CT imaging of the head and cervical spine was performed following the standard protocol without intravenous contrast. Multiplanar CT image reconstructions of the cervical spine were also generated. COMPARISON:  None. FINDINGS: CT HEAD FINDINGS Brain: No evidence of acute infarction, hemorrhage, hydrocephalus, extra-axial collection or mass lesion/mass effect. Vascular: No hyperdense vessel or unexpected calcification. Skull: Normal. Negative for fracture or focal lesion. Sinuses/Orbits: No acute finding. Other: None. CT CERVICAL SPINE FINDINGS Alignment: Within  normal limits. Skull base and vertebrae: 7 cervical segments are well visualized. Vertebral body height is well maintained. Facet hypertrophic changes are noted throughout the cervical spine. No acute fracture or acute facet abnormality is noted. Osteophytic changes are noted most marked at C5-6 and C6-7. Soft tissues and spinal canal: Vascular calcifications are noted. No acute soft tissue abnormality is noted. Upper chest: Visualized upper chest is unremarkable. Other: None IMPRESSION: CT of the head: No acute intracranial abnormality noted. CT of the cervical spine: Multilevel degenerative change without acute abnormality. Electronically Signed   By: Inez Catalina M.D.   On: 11/05/2017 16:21

## 2017-10-28 NOTE — Progress Notes (Addendum)
PROGRESS NOTE    Brooke Garcia  ZOX:[id number] DOB: 03-Dec-1955 DOA: 10/18/2017 PCP: Janith Lima, MD   Brief Narrative: Brooke Garcia is a 62 y.o. female with medical history significant of hypertension, COPD, CKD, DVT, hepatitis C,uterine prolapse, thoracic abdominal aneurysm, hypercoagulable state who was brought to the emergency department after she was found on the floor. Patient found to have altered mental status ,diarrhea, acute renal failure, lactic acidosis, severe leukocytosis and elevated CPK on presentation.Sepsis was also suspected and she has been started on broad-spectrum antibiotics.  PCCM ,nephrology and GI following.  Assessment & Plan:   Principal Problem:   Acute kidney injury superimposed on CKD ([place]) Active Problems:   TOBACCO ABUSE   HYPERTENSION, BENIGN ESSENTIAL   DVT of lower extremity, bilateral (HCC)   COPD, moderate (HCC)   Acute renal failure (HCC)   Leucocytosis   Chronic hepatitis C without hepatic coma (HCC)   Chronic renal disease, stage 3, moderately decreased glomerular filtration rate (GFR) between 30-59 mL/min/1.73 square meter (HCC)   Loose stools   Severe sepsis (HCC)   Rhabdomyolysis   Hypokalemia   Altered mental status   Cystocele with prolapse   Elevated liver enzymes   Diarrhea   Abnormal CT scan, colon   Enterocolitis  Acute kidney injury on CKD: Her creatinine on 09/08/17 was 1.3.  Presented with severe acute kidney injury .  Most likely this is prerenal AKI due to volume depletion versus acute tubular necrosis due to rhabdo.Nephrology following.Also started on bicarb drip for acidosis. Difficult foley catheterization due to cystocele.  Urology placed Foley catheter placement. Kidney function has not improved.  Possible sepsis: Unknown etiology .  Will check procalcitonin.Patient presented with severe lactic acidosis and leukocytosis.  Blood pressure stable. Chest x-ray did not show any pneumonia.  UA was not impressive for  UTI Blood cultures and urine culture have been sent. Patient has been started on broad-spectrum antibiotics.  Will monitor lactic acid which was elevated on presentation.  Antibiotics will be stopped as appropriate if needed. Leucocytosis,lactic acidosis improving.  Altered mental status:Improving.Most likely secondary to dehydration, acute kidney injury and sepsis. Cud also be secondary to polypharmacy and drugs. We will continue to monitor her status.UDS showed benzo and opiates.  Patient was also on Xanax and oxycodone at home.  We will hold these. CT imaging done in the emergency department did not show any acute intracranial abnormalities.  Enterocolitis/Diarrhoea:Negative for C. difficile, GI pathogen panel.  On rectal tube.   Patient also has generalized abdominal tenderness. CT abdomen/pelvis without contrast showed pan enterocolitis.  GI suspecting ischemic colitis. Started on soft diet today. CT also showed 2.7 cm stone without gallbladder inflammation.  She can follow-up with surgery as an outpatient.  Fall/rhabdomyolysis:  We will continue IV fluids aggressively and continue to monitor CK which is improving.  She will be evaluated by physical therapy later on. Imagings done in the emergency department did not show any acute fracture or dislocation.  Smoker/history of COPD: Currently COPD is stable.  She follows with pulmonology as an outpatient.  Continue to monitor respiratory status.  She was hypoxic on presentation most likely secondary to sepsis.  History of DVT/hypercoagulability/ ? Protein S deficiency: On Xarelto at home .  Started on heparin drip due to AKI.  Hypokalemia: We will continue  Repletion.Will check tomorrow  Hypertension: Currently blood pressure stable.  She is  on Irbesartan at home which we will hold due to acute kidney injury  Hep C:  Not on treatment.Follow up with GI as an outpatient.  Alcoholism: Patient has H/O chronic alcoholism.Not on  withdrawal.    DVT prophylaxis:Heparin Code Status: Full Family Communication: Updated the current situation and plan to the sister on phone Disposition Plan: Unknown at this time   Consultants: Nephrology,PCCM  Procedures: None  Antimicrobials: Vancomycin and Zosyn since 10/20/2017  Subjective: Patient seen and examined the bedside this morning.  Mental status has improved.  She continues to have diarrhea.  Complains of mild abdominal pain.  Kidney function worsened today.  Objective: Vitals:   10/28/17 0600 10/28/17 0800 10/28/17 0902 10/28/17 1200  BP: (!) 151/48  (!) 132/97 (!) 145/95  Pulse: 68  88   Resp: (!) 28  (!) 32 18  Temp:  98.9 F (37.2 C)  98.2 F (36.8 C)  TempSrc:  Axillary  Oral  SpO2: 97%  96%   Weight: 88.5 kg (195 lb 1.7 oz)       Intake/Output Summary (Last 24 hours) at 10/28/2017 1350 Last data filed at 10/28/2017 1342 Gross per 24 hour  Intake 2703.84 ml  Output 1350 ml  Net 1353.84 ml   Filed Weights   10/28/17 0600  Weight: 88.5 kg (195 lb 1.7 oz)    Examination:  General exam: Appears calm  ,Not in obviousdistress,average built HEENT:PERRL,Oral mucosa moist, Ear/Nose normal on gross exam Respiratory system: Bilateral equal air entry, normal vesicular breath sounds, no wheezes or crackles  Cardiovascular system: S1 & S2 heard, RRR. No JVD, murmurs, rubs, gallops or clicks. Gastrointestinal system: Abdomen is nondistended, soft .  Mild generalized tenderness ,no organomegaly or masses felt. Normal bowel sounds heard. Central nervous system: Alert and oriented times 2. No focal neurological deficits. Extremities: No edema, no clubbing ,no cyanosis, distal peripheral pulses palpable. Skin: No rashes, lesions or ulcers,no icterus ,no pallor MSK: Normal muscle bulk,tone ,power  Data Reviewed: I have personally reviewed following labs and imaging studies  CBC: Recent Labs  Lab 10/30/2017 1429 10/28/2017 1451 10/27/17 0816 10/28/17 0155  WBC  30.5*  --  23.4* 19.5*  NEUTROABS 26.2*  --  19.5* 16.3*  HGB 18.1* 18.0* 16.2* 13.1  HCT 48.4* 53.0* 44.8 36.3  MCV 94.2  --  95.5 94.5  PLT 266  --  174 998   Basic Metabolic Panel: Recent Labs  Lab 11/07/2017 1429 10/22/2017 1451 10/27/17 0345 10/28/17 0155  NA 144 144 145 143  K 2.9* 2.8* 3.4* 3.0*  CL 100* 103 111 105  CO2 18*  --  15* 19*  GLUCOSE 138* 126* 111* 107*  BUN 58* 51* 62* 72*  CREATININE 5.10* 4.90* 5.07* 5.37*  CALCIUM 9.8  --  8.0* 7.9*  MG  --   --  1.9  --   PHOS  --   --  4.2  --    GFR: Estimated Creatinine Clearance: 11.6 mL/min (A) (by C-G formula based on SCr of 5.37 mg/dL (H)). Liver Function Tests: Recent Labs  Lab 10/23/2017 1429 10/27/17 0345  AST 245* 231*  ALT 97* 91*  ALKPHOS 97 69  BILITOT 1.6* 1.5*  PROT 9.4* 6.9  ALBUMIN 4.1 3.0*   No results for input(s): LIPASE, AMYLASE in the last 168 hours. Recent Labs  Lab 10/23/2017 1429  AMMONIA 17   Coagulation Profile: Recent Labs  Lab 10/19/2017 1429  INR 1.09   Cardiac Enzymes: Recent Labs  Lab 10/18/2017 1429 10/27/17 0345 10/28/17 0155  CKTOTAL 9,306* 6,917* 4,627*   BNP (last 3 results) No results  for input(s): PROBNP in the last 8760 hours. HbA1C: No results for input(s): HGBA1C in the last 72 hours. CBG: Recent Labs  Lab 10/28/2017 1435  GLUCAP 132*   Lipid Profile: No results for input(s): CHOL, HDL, LDLCALC, TRIG, CHOLHDL, LDLDIRECT in the last 72 hours. Thyroid Function Tests: No results for input(s): TSH, T4TOTAL, FREET4, T3FREE, THYROIDAB in the last 72 hours. Anemia Panel: No results for input(s): VITAMINB12, FOLATE, FERRITIN, TIBC, IRON, RETICCTPCT in the last 72 hours. Sepsis Labs: Recent Labs  Lab 10/19/2017 1736 10/27/17 0050 10/27/17 0345 10/28/17 0155  LATICACIDVEN 3.81* 2.6* 2.2* 2.1*    Recent Results (from the past 240 hour(s))  Blood Cultures (routine x 2)     Status: None (Preliminary result)   Collection Time: 11/04/2017  2:32 PM  Result Value  Ref Range Status   Specimen Description   Final    BLOOD LEFT ANTECUBITAL Performed at [facility], [place] [address], [place], [place] [address]    Special Requests   Final    BOTTLES DRAWN AEROBIC AND ANAEROBIC Blood Culture adequate volume Performed at [facility] [address], [place], [place] [address]    Culture   Final    NO GROWTH < 24 HOURS Performed at [facility] [address] [place], [place] [address]    Report Status PENDING  Incomplete  Gastrointestinal Panel by PCR , Stool     Status: None   Collection Time: 11/04/2017  7:00 PM  Result Value Ref Range Status   Campylobacter species NOT DETECTED NOT DETECTED Final   Plesimonas shigelloides NOT DETECTED NOT DETECTED Final   Salmonella species NOT DETECTED NOT DETECTED Final   Yersinia enterocolitica NOT DETECTED NOT DETECTED Final   Vibrio species NOT DETECTED NOT DETECTED Final   Vibrio cholerae NOT DETECTED NOT DETECTED Final   Enteroaggregative E coli (EAEC) NOT DETECTED NOT DETECTED Final   Enteropathogenic E coli (EPEC) NOT DETECTED NOT DETECTED Final   Enterotoxigenic E coli (ETEC) NOT DETECTED NOT DETECTED Final   Shiga like toxin producing E coli (STEC) NOT DETECTED NOT DETECTED Final   Shigella/Enteroinvasive E coli (EIEC) NOT DETECTED NOT DETECTED Final   Cryptosporidium NOT DETECTED NOT DETECTED Final   Cyclospora cayetanensis NOT DETECTED NOT DETECTED Final   Entamoeba histolytica NOT DETECTED NOT DETECTED Final   Giardia lamblia NOT DETECTED NOT DETECTED Final   Adenovirus F40/41 NOT DETECTED NOT DETECTED Final   Astrovirus NOT DETECTED NOT DETECTED Final   Norovirus GI/GII NOT DETECTED NOT DETECTED Final   Rotavirus A NOT DETECTED NOT DETECTED Final   Sapovirus (I, II, IV, and V) NOT DETECTED NOT DETECTED Final    Comment: Performed at [facility], [place] [place], [place] [address]  C difficile quick scan w PCR reflex     Status: None    Collection Time: 10/19/2017  7:00 PM  Result Value Ref Range Status   C Diff antigen NEGATIVE NEGATIVE Final   C Diff toxin NEGATIVE NEGATIVE Final   C Diff interpretation No C. difficile detected.  Final    Comment: Performed at [facility], [place] [address] [place], [place] [address]         Radiology Studies: Ct Abdomen Pelvis Wo Contrast  Result Date: 10/27/2017 CLINICAL DATA:  Found on the floor. Altered mental status, diarrhea, acute renal failure, severe leukocytosis EXAM: CT ABDOMEN AND PELVIS WITHOUT CONTRAST TECHNIQUE: Multidetector CT imaging of the abdomen and pelvis was performed  following the standard protocol without IV contrast. COMPARISON:  05/04/2012 FINDINGS: Lower chest: Left lower lobe atelectasis. Calcific atherosclerotic disease of the aorta. Hepatobiliary: Normal appearance of the liver. 2.7 cm calculus within the dependent portion of the gallbladder. No secondary signs of acute cholecystitis. Pancreas: Unremarkable. No pancreatic ductal dilatation or surrounding inflammatory changes. Spleen: Normal in size without focal abnormality. Adrenals/Urinary Tract: Adrenal glands are unremarkable. Kidneys are normal, without renal calculi, focal lesion, or hydronephrosis. Bladder is unremarkable. Stomach/Bowel: Fluid-filled small and large bowel loops with upper limits of normal caliber of the small bowels. No transitional point seen. Featureless appearance of the colon usually seen with colitis. Stool retention device in the rectum. Vascular/Lymphatic: Aortic atherosclerosis. No enlarged abdominal or pelvic lymph nodes. Reproductive: Uterus and bilateral adnexa are unremarkable. Other: No abdominal wall hernia or abnormality. No abdominopelvic ascites. Musculoskeletal: No acute osseous findings. Multilevel osteoarthritic changes of the lumbosacral spine. IMPRESSION: 2.7 cm calculus within the dependent portion of the gallbladder. No CT evidence of acute  cholecystitis on this motion degraded exam. Pan enterocolitis with evidence of diarrheal state. Calcific atherosclerotic disease and tortuosity of the aorta. Electronically Signed   By: Fidela Salisbury M.D.   On: 10/27/2017 14:43   Dg Chest 2 View  Result Date: 11/02/2017 CLINICAL DATA:  Fall. EXAM: CHEST  2 VIEW COMPARISON:  CT chest dated October 05, 2017. Chest x-ray dated May 16, 2015. FINDINGS: Stable cardiomediastinal silhouette. Normal pulmonary vascularity. Atherosclerotic calcification of the aortic arch. No focal consolidation, pleural effusion, or pneumothorax. No acute osseous abnormality. IMPRESSION: No active cardiopulmonary disease. Electronically Signed   By: Titus Dubin M.D.   On: 10/24/2017 16:25   Dg Pelvis 1-2 Views  Result Date: 10/15/2017 CLINICAL DATA:  Recent fall EXAM: PELVIS - 1-2 VIEW COMPARISON:  None. FINDINGS: Pelvic ring is intact. No acute fracture or dislocation is seen. Degenerative changes of lumbar spine are noted. No soft tissue abnormality is noted. IMPRESSION: No acute abnormality seen. Electronically Signed   By: Inez Catalina M.D.   On: 10/25/2017 16:24   Ct Head Wo Contrast  Result Date: 11/09/2017 CLINICAL DATA:  Altered mental status and recent fall EXAM: CT HEAD WITHOUT CONTRAST CT CERVICAL SPINE WITHOUT CONTRAST TECHNIQUE: Multidetector CT imaging of the head and cervical spine was performed following the standard protocol without intravenous contrast. Multiplanar CT image reconstructions of the cervical spine were also generated. COMPARISON:  None. FINDINGS: CT HEAD FINDINGS Brain: No evidence of acute infarction, hemorrhage, hydrocephalus, extra-axial collection or mass lesion/mass effect. Vascular: No hyperdense vessel or unexpected calcification. Skull: Normal. Negative for fracture or focal lesion. Sinuses/Orbits: No acute finding. Other: None. CT CERVICAL SPINE FINDINGS Alignment: Within normal limits. Skull base and vertebrae: 7 cervical  segments are well visualized. Vertebral body height is well maintained. Facet hypertrophic changes are noted throughout the cervical spine. No acute fracture or acute facet abnormality is noted. Osteophytic changes are noted most marked at C5-6 and C6-7. Soft tissues and spinal canal: Vascular calcifications are noted. No acute soft tissue abnormality is noted. Upper chest: Visualized upper chest is unremarkable. Other: None IMPRESSION: CT of the head: No acute intracranial abnormality noted. CT of the cervical spine: Multilevel degenerative change without acute abnormality. Electronically Signed   By: Inez Catalina M.D.   On: 11/02/2017 16:21   Ct Cervical Spine Wo Contrast  Result Date: 10/15/2017 CLINICAL DATA:  Altered mental status and recent fall EXAM: CT HEAD WITHOUT CONTRAST CT CERVICAL SPINE WITHOUT CONTRAST TECHNIQUE: Multidetector CT imaging of  the head and cervical spine was performed following the standard protocol without intravenous contrast. Multiplanar CT image reconstructions of the cervical spine were also generated. COMPARISON:  None. FINDINGS: CT HEAD FINDINGS Brain: No evidence of acute infarction, hemorrhage, hydrocephalus, extra-axial collection or mass lesion/mass effect. Vascular: No hyperdense vessel or unexpected calcification. Skull: Normal. Negative for fracture or focal lesion. Sinuses/Orbits: No acute finding. Other: None. CT CERVICAL SPINE FINDINGS Alignment: Within normal limits. Skull base and vertebrae: 7 cervical segments are well visualized. Vertebral body height is well maintained. Facet hypertrophic changes are noted throughout the cervical spine. No acute fracture or acute facet abnormality is noted. Osteophytic changes are noted most marked at C5-6 and C6-7. Soft tissues and spinal canal: Vascular calcifications are noted. No acute soft tissue abnormality is noted. Upper chest: Visualized upper chest is unremarkable. Other: None IMPRESSION: CT of the head: No acute  intracranial abnormality noted. CT of the cervical spine: Multilevel degenerative change without acute abnormality. Electronically Signed   By: Inez Catalina M.D.   On: 11/04/2017 16:21   US Abdomen Limited Ruq  Result Date: 10/28/2017 CLINICAL DATA:  Gallbladder stone with non acute cholecystitis. EXAM: ULTRASOUND ABDOMEN LIMITED RIGHT UPPER QUADRANT COMPARISON:  CT abdomen and pelvis 10/27/2017 FINDINGS: Gallbladder: A 2.6 cm shadowing stone is present at the neck of the gallbladder. The gallbladder is contracted. Wall is within normal limits at 3.5 mm. There is no sonographic Murphy sign. Common bile duct: Diameter: 4.6 mm, within normal limits Liver: The liver is diffusely echogenic. No focal lesions are present. The contour is smooth. Portal vein is patent on color Doppler imaging with normal direction of blood flow towards the liver. IMPRESSION: 1. 2.6 cm shadowing gallstone without evidence for cholecystitis. 2. Hepatic steatosis. Electronically Signed   By: San Morelle M.D.   On: 10/28/2017 11:02        Scheduled Meds: . fentaNYL (SUBLIMAZE) injection  50 mcg Intravenous Once  . levalbuterol  0.63 mg Nebulization TID  . nicotine  14 mg Transdermal Daily  . thiamine  100 mg Oral Daily   Continuous Infusions: . heparin 1,400 Units/hr (10/28/17 0600)  . piperacillin-tazobactam (ZOSYN)  IV Stopped (10/28/17 1300)  . potassium chloride 10 mEq (10/28/17 1339)  .  sodium bicarbonate (isotonic) infusion in sterile water 100 mL/hr at 10/28/17 0600     LOS: 2 days    Time spent: 25 mins      Eduardo Wurth Jodie Echevaria, MD [facility] Pager [phone]  If 7PM-7AM, please contact night-coverage [email or web address] Password [email or web address] 10/28/2017, 1:50 PM

## 2017-10-28 NOTE — Progress Notes (Signed)
ANTICOAGULATION CONSULT NOTE -  Pharmacy Consult for Heparin Indication: History of DVTs  Allergies  Allergen Reactions  . Butalbital-Apap-Caffeine Hives  . Naproxen Hives  . Tylenol [Acetaminophen] Other (See Comments)    Upset stomach    Patient Measurements: Weight: 195 lb 1.7 oz (88.5 kg)  Height: 63 inches Weight: 88.5kg (Dec 2017) Heparin Dosing Weight: 72.4kg  Vital Signs: Temp: 98.9 F (37.2 C) (02/15 0800) Temp Source: Axillary (02/15 0800) BP: 132/97 (02/15 0902) Pulse Rate: 88 (02/15 0902)  Labs: Recent Labs    10/15/2017 1429 10/30/2017 1451 10/27/17 0345  10/27/17 0816 10/27/17 1742 10/28/17 0155 10/28/17 1026  HGB 18.1* 18.0*  --   --  16.2*  --  13.1  --   HCT 48.4* 53.0*  --   --  44.8  --  36.3  --   PLT 266  --   --   --  174  --  150  --   APTT  --   --   --   --   --  34  --   --   LABPROT 14.0  --   --   --   --   --   --   --   INR 1.09  --   --   --   --   --   --   --   HEPARINUNFRC  --   --   --    < > <0.10* 0.19* 0.59 0.40  CREATININE 5.10* 4.90* 5.07*  --   --   --  5.37*  --   CKTOTAL 9,306*  --  7,062*  --   --   --  3,762*  --    < > = values in this interval not displayed.    Estimated Creatinine Clearance: 11.6 mL/min (A) (by C-G formula based on SCr of 5.37 mg/dL (H)).   Medical History: Past Medical History:  Diagnosis Date  . Acute venous embolism and thrombosis of unspecified deep vessels of lower extremity   . Anxiety state, unspecified   . Arthritis   . Blood dyscrasia    protein def.  . Calculus of kidney   . Colitis   . Complete rupture of rotator cuff   . COPD (chronic obstructive pulmonary disease) ([place])   . Degeneration of intervertebral disc, site unspecified   . Depression   . DVT (deep venous thrombosis) ([place])   . Family history of ischemic heart disease   . History of gallstones   . HLD (hyperlipidemia)   . HTN (hypertension)   . Other and unspecified noninfectious gastroenteritis and colitis(558.9)   .  Painful respiration   . Primary hypercoagulable state ([place])   . Protein S deficiency ([place])   . Renal failure   . Tobacco use disorder   . Unspecified urinary incontinence   . Venous stasis ulcers (HCC)    left leg     Medications:  Scheduled:  . fentaNYL (SUBLIMAZE) injection  50 mcg Intravenous Once  . levalbuterol  0.63 mg Nebulization TID  . nicotine  14 mg Transdermal Daily  . thiamine  100 mg Oral Daily   Infusions:  . heparin 1,400 Units/hr (10/28/17 0600)  . piperacillin-tazobactam (ZOSYN)  IV Stopped (10/28/17 0730)  . potassium chloride 10 mEq (10/28/17 1010)  .  sodium bicarbonate (isotonic) infusion in sterile water 100 mL/hr at 10/28/17 0600   PRN: iopamidol, labetalol, levalbuterol, LORazepam  Assessment: 62 yo female admitted 2/13 with altered mental status and  severe sepsis of unknown etiology after being found in a hotel.  Patient was on Xarelto for history of DVTs.  Pharmacy consulted to dose IV heparin since cannot use Xarelto with acute renal failure  Today, 10/28/2017  Heparin level therapeutic x 2 on 1400 units/hr  Hgb and Plts WNL  No bleeding or other issues per RN  Goal of Therapy:  Heparin level 0.3-0.7 units/ml  PTT 66-102 seconds Monitor platelets by anticoagulation protocol: Yes   Plan:   Continue Heparin IV infusion at 1400 units/hr  Daily heparin level and CBC  Peggyann Juba, PharmD, BCPS Pager: [phone] 10/28/2017, 10:59 AM

## 2017-10-29 DIAGNOSIS — F1010 Alcohol abuse, uncomplicated: Secondary | ICD-10-CM

## 2017-10-29 DIAGNOSIS — R933 Abnormal findings on diagnostic imaging of other parts of digestive tract: Secondary | ICD-10-CM

## 2017-10-29 DIAGNOSIS — N179 Acute kidney failure, unspecified: Secondary | ICD-10-CM

## 2017-10-29 DIAGNOSIS — R197 Diarrhea, unspecified: Secondary | ICD-10-CM

## 2017-10-29 LAB — VANCOMYCIN, RANDOM: VANCOMYCIN RM: 20

## 2017-10-29 LAB — LACTIC ACID, PLASMA: LACTIC ACID, VENOUS: 1.2 mmol/L (ref 0.5–1.9)

## 2017-10-29 LAB — BASIC METABOLIC PANEL
Anion gap: 15 (ref 5–15)
BUN: 64 mg/dL — AB (ref 6–20)
CHLORIDE: 100 mmol/L — AB (ref 101–111)
CO2: 26 mmol/L (ref 22–32)
CREATININE: 4.45 mg/dL — AB (ref 0.44–1.00)
Calcium: 7.7 mg/dL — ABNORMAL LOW (ref 8.9–10.3)
GFR calc Af Amer: 11 mL/min — ABNORMAL LOW (ref >60)
GFR calc non Af Amer: 10 mL/min — ABNORMAL LOW (ref >60)
GLUCOSE: 102 mg/dL — AB (ref 65–99)
Potassium: 3.2 mmol/L — ABNORMAL LOW (ref 3.5–5.1)
Sodium: 141 mmol/L (ref 135–145)

## 2017-10-29 LAB — CBC
HEMATOCRIT: 33.5 % — AB (ref 36.0–46.0)
Hemoglobin: 11.9 g/dL — ABNORMAL LOW (ref 12.0–15.0)
MCH: 33.9 pg (ref 26.0–34.0)
MCHC: 35.5 g/dL (ref 30.0–36.0)
MCV: 95.4 fL (ref 78.0–100.0)
Platelets: 126 10*3/uL — ABNORMAL LOW (ref 150–400)
RBC: 3.51 MIL/uL — AB (ref 3.87–5.11)
RDW: 13.3 % (ref 11.5–15.5)
WBC: 13.6 10*3/uL — ABNORMAL HIGH (ref 4.0–10.5)

## 2017-10-29 LAB — HEPARIN LEVEL (UNFRACTIONATED): Heparin Unfractionated: 0.59 IU/mL (ref 0.30–0.70)

## 2017-10-29 LAB — CK: Total CK: 1732 U/L — ABNORMAL HIGH (ref 38–234)

## 2017-10-29 LAB — PROCALCITONIN: PROCALCITONIN: 3.35 ng/mL

## 2017-10-29 MED ORDER — POTASSIUM CHLORIDE CRYS ER 20 MEQ PO TBCR
40.0000 meq | EXTENDED_RELEASE_TABLET | Freq: Once | ORAL | Status: AC
Start: 2017-10-29 — End: 2017-10-29
  Administered 2017-10-29: 40 meq via ORAL
  Filled 2017-10-29: qty 2

## 2017-10-29 MED ORDER — POTASSIUM CHLORIDE CRYS ER 20 MEQ PO TBCR
40.0000 meq | EXTENDED_RELEASE_TABLET | Freq: Once | ORAL | Status: AC
  Administered 2017-10-29: 40 meq via ORAL
  Filled 2017-10-29: qty 2

## 2017-10-29 MED ORDER — POTASSIUM CHLORIDE 10 MEQ/100ML IV SOLN
10.0000 meq | INTRAVENOUS | Status: AC
Start: 2017-10-29 — End: 2017-10-30
  Administered 2017-10-29 (×4): 10 meq via INTRAVENOUS
  Filled 2017-10-29 (×5): qty 100

## 2017-10-29 MED ORDER — TRAZODONE HCL 50 MG PO TABS
50.0000 mg | ORAL_TABLET | Freq: Once | ORAL | Status: AC
  Administered 2017-10-29: 50 mg via ORAL
  Filled 2017-10-29: qty 1

## 2017-10-29 MED ORDER — POTASSIUM CHLORIDE 10 MEQ/100ML IV SOLN
INTRAVENOUS | Status: AC
  Administered 2017-10-29: 10 meq via INTRAVENOUS
  Filled 2017-10-29: qty 100

## 2017-10-29 MED ORDER — VANCOMYCIN HCL IN DEXTROSE 1-5 GM/200ML-% IV SOLN
1000.0000 mg | Freq: Once | INTRAVENOUS | Status: AC
  Administered 2017-10-29: 1000 mg via INTRAVENOUS
  Filled 2017-10-29: qty 200

## 2017-10-29 NOTE — Progress Notes (Signed)
ANTICOAGULATION CONSULT NOTE -  Pharmacy Consult for Heparin Indication: History of DVTs  Allergies  Allergen Reactions  . Butalbital-Apap-Caffeine Hives  . Naproxen Hives  . Tylenol [Acetaminophen] Other (See Comments)    Upset stomach    Patient Measurements: Height: 5\' 3"  (160 cm) Weight: 182 lb 1.6 oz (82.6 kg) IBW/kg (Calculated) : 52.4  Height: 63 inches Weight: 88.5kg (Dec 2017) Heparin Dosing Weight: 72.4kg  Vital Signs: Temp: 98.4 F (36.9 C) (02/16 0401) Temp Source: Oral (02/16 0401) BP: 158/63 (02/16 0600) Pulse Rate: 68 (02/16 0600)  Labs: Recent Labs    11/07/2017 1429  10/27/17 0345  10/27/17 0816 10/27/17 1742 10/28/17 0155 10/28/17 1026 10/29/17 0316  HGB 18.1*   < >  --   --  16.2*  --  13.1  --  11.9*  HCT 48.4*   < >  --   --  44.8  --  36.3  --  33.5*  PLT 266  --   --   --  174  --  150  --  126*  APTT  --   --   --   --   --  34  --   --   --   LABPROT 14.0  --   --   --   --   --   --   --   --   INR 1.09  --   --   --   --   --   --   --   --   HEPARINUNFRC  --   --   --    < > <0.10* 0.19* 0.59 0.40 0.59  CREATININE 5.10*   < > 5.07*  --   --   --  5.37*  --  4.45*  CKTOTAL 9,306*  --  5,573*  --   --   --  2,202*  --  1,732*   < > = values in this interval not displayed.    Estimated Creatinine Clearance: 13.5 mL/min (A) (by C-G formula based on SCr of 4.45 mg/dL (H)).   Medical History: Past Medical History:  Diagnosis Date  . Acute venous embolism and thrombosis of unspecified deep vessels of lower extremity   . Anxiety state, unspecified   . Arthritis   . Blood dyscrasia    protein def.  . Calculus of kidney   . Colitis   . Complete rupture of rotator cuff   . COPD (chronic obstructive pulmonary disease) ([place])   . Degeneration of intervertebral disc, site unspecified   . Depression   . DVT (deep venous thrombosis) ([place])   . Family history of ischemic heart disease   . History of gallstones   . HLD (hyperlipidemia)   .  HTN (hypertension)   . Other and unspecified noninfectious gastroenteritis and colitis(558.9)   . Painful respiration   . Primary hypercoagulable state ([place])   . Protein S deficiency ([place])   . Renal failure   . Tobacco use disorder   . Unspecified urinary incontinence   . Venous stasis ulcers (HCC)    left leg     Medications:  Scheduled:  . Chlorhexidine Gluconate Cloth  6 each Topical Q0600  . dicyclomine  10 mg Oral TID AC & HS  . fentaNYL (SUBLIMAZE) injection  50 mcg Intravenous Once  . levalbuterol  0.63 mg Nebulization TID  . mupirocin ointment  1 application Nasal BID  . nicotine  14 mg Transdermal Daily  . thiamine  100  mg Oral Daily   Infusions:  . heparin 1,400 Units/hr (10/29/17 0500)  . piperacillin-tazobactam (ZOSYN)  IV 2.25 g (10/29/17 0600)  .  sodium bicarbonate (isotonic) infusion in sterile water 100 mL/hr at 10/29/17 0601   PRN: iopamidol, labetalol, levalbuterol, LORazepam, morphine injection  Assessment: 62 yo female admitted 2/13 with altered mental status and severe sepsis of unknown etiology after being found in a hotel.  Patient was on Xarelto for history of DVTs.  Pharmacy consulted to dose IV heparin since cannot use Xarelto with acute renal failure  Today, 10/29/2017  Heparin level therapeutic x 3 on 1400 units/hr  Hgb and Plts trending down. No bleeding reported/documented by RN this AM.  SCr starting to improve.  Goal of Therapy:  Heparin level 0.3-0.7 units/ml  Monitor platelets by anticoagulation protocol: Yes   Plan:   Continue Heparin IV infusion at 1400 units/hr  Daily heparin level and CBC  Romeo Rabon, PharmD. [place]: [phone]. 10/29/2017,8:04 AM.

## 2017-10-29 NOTE — Progress Notes (Signed)
PROGRESS NOTE    Brooke Garcia  ALP:[id number] DOB: August 24, 1956 DOA: 10/15/2017 PCP: Brooke Lima, MD   Brief Narrative: Brooke Garcia is a 62 y.o. female with medical history significant of hypertension, COPD, CKD, DVT, hepatitis C,uterine prolapse, thoracic abdominal aneurysm, hypercoagulable state who was brought to the emergency department after she was found on the floor. Patient found to have altered mental status ,diarrhea, acute renal failure, lactic acidosis, severe leukocytosis and elevated CPK on presentation.Sepsis was also suspected and she has been started on broad-spectrum antibiotics.  PCCM ,nephrology and GI following.  Assessment & Plan:   Principal Problem:   Acute kidney injury superimposed on CKD ([place]) Active Problems:   TOBACCO ABUSE   HYPERTENSION, BENIGN ESSENTIAL   DVT of lower extremity, bilateral (HCC)   COPD, moderate (HCC)   Acute renal failure (HCC)   Leucocytosis   Chronic hepatitis C without hepatic coma (HCC)   Chronic renal disease, stage 3, moderately decreased glomerular filtration rate (GFR) between 30-59 mL/min/1.73 square meter (HCC)   Loose stools   Severe sepsis (HCC)   Rhabdomyolysis   Hypokalemia   Altered mental status   Cystocele with prolapse   Elevated liver enzymes   Diarrhea   Abnormal CT scan, colon   Enterocolitis  Brief Narrative: Brooke Garcia is a 62 y.o. female with medical history significant of hypertension, COPD, CKD, DVT, hepatitis C,uterine prolapse, thoracic abdominal aneurysm, hypercoagulable state who was brought to the emergency department after she was found on the floor. Patient found to have altered mental status ,diarrhea, acute renal failure, lactic acidosis, severe leukocytosis and elevated CPK on presentation.Sepsis was also suspected and she has been started on broad-spectrum antibiotics.  PCCM ,nephrology and GI following.   Plan:-  1)Acute kidney injury on CKD: Her creatinine on 09/08/17 was 1.3, Now   Creatinine is  > 4.  Most likely this is prerenal AKI due to volume depletion versus acute tubular necrosis due to rhabdo.Nephrology following, additional IV fluids/bicarb drip, urology placed Foley catheter placement, urine output improving  2)Possible sepsis: Unknown etiology .   discussed with pharmacist, continue IV Zosyn adjusted for renal function, give 1 more dose of IV vancomycin on 10/29/2017, negative cultures so far, MRSA PCR screen was +Ve, leukocytosis (down to13K from 30,000), lactic acid (down to 1.2 from 2.1) and pro-calcitonin (pro calcitonin is down to 3.3 from 6.9) have all  improved with antibiotics.    No evidence of UTI or pneumonia at this time  3)Altered mental status:Improving.Most likely secondary to dehydration, acute kidney injury and sepsis. ?? secondary to polypharmacy and drugs. (UDS with opiates and benzos ). Patient was also on Xanax and oxycodone at home.  We will hold these. CT head without acute findings  4)Enterocolitis/Diarrhoea:Negative for C. difficile, GI pathogen panel.  c/n  rectal tube.    CT abdomen/pelvis without contrast showed pan enterocolitis, continue Zosyn,  GI suspecting ischemic colitis in the setting of hypertension, cholelithiasis without cholecystitis noted on abdominal imaging  5)Fall/Rhabdomyolysis:   Continue bicarb drip, CK down to 1700 from over 4600  6)Smoker/history of COPD: Currently COPD is stable.  No acute exacerbation at this time continue bronchodilators  7)History of DVT/hypercoagulability/ ? Protein S deficiency: On Xarelto at home, .  C/N  on heparin drip due to AKI.  8)HTN- stable, continue to hold irbesatan due to kidney concerns   9)Hep C: Not on treatment.Follow up with GI as an outpatient, patient admits to history of prior IV drug use  10)Alcoholism: Patient has H/O chronic alcoholism.no acute disease at this time, give thiamine and folic acid    DVT prophylaxis:Heparin Code Status: Full  Disposition Plan:  Unknown at this time   Consultants: Nephrology,PCCM  Procedures: None  Antimicrobials: Vancomycin (last dose 10/29/17) and Zosyn since 10/18/2017  Subjective:  RN kim at bedside, no new concerns, tolerating oral intake well diarrhea persist , no fevers  Objective: Vitals:   10/29/17 1442 10/29/17 1545 10/29/17 1600 10/29/17 1615  BP:   (!) 135/47   Pulse:   70   Resp:   (!) 32   Temp:  98.5 F (36.9 C)    TempSrc:  Oral  Oral  SpO2: 97%  94%   Weight:      Height:        Intake/Output Summary (Last 24 hours) at 10/29/2017 1647 Last data filed at 10/29/2017 1500 Gross per 24 hour  Intake 3110.75 ml  Output 2335 ml  Net 775.75 ml   Filed Weights   10/28/17 0600 10/29/17 0401  Weight: 88.5 kg (195 lb 1.7 oz) 82.6 kg (182 lb 1.6 oz)   Physical Exam  Gen:- Awake Alert,  In no apparent distress  HEENT:- [place].AT, No sclera icterus Neck-Supple Neck,No JVD,.  Lungs-  CTAB  CV- S1, S2 normal Abd-  +ve B.Sounds, Abd Soft, No tenderness,    Extremity/Skin:- No  edema,   Rectal-rectal tube noted GU-Foley catheter noted Psych-affect is appropriate  Data Reviewed: I have personally reviewed following labs and imaging studies  CBC: Recent Labs  Lab 10/27/2017 1429 10/17/2017 1451 10/27/17 0816 10/28/17 0155 10/29/17 0316  WBC 30.5*  --  23.4* 19.5* 13.6*  NEUTROABS 26.2*  --  19.5* 16.3*  --   HGB 18.1* 18.0* 16.2* 13.1 11.9*  HCT 48.4* 53.0* 44.8 36.3 33.5*  MCV 94.2  --  95.5 94.5 95.4  PLT 266  --  174 150 998*   Basic Metabolic Panel: Recent Labs  Lab 10/27/2017 1429 11/07/2017 1451 10/27/17 0345 10/28/17 0155 10/29/17 0316  NA 144 144 145 143 141  K 2.9* 2.8* 3.4* 3.0* 3.2*  CL 100* 103 111 105 100*  CO2 18*  --  15* 19* 26  GLUCOSE 138* 126* 111* 107* 102*  BUN 58* 51* 62* 72* 64*  CREATININE 5.10* 4.90* 5.07* 5.37* 4.45*  CALCIUM 9.8  --  8.0* 7.9* 7.7*  MG  --   --  1.9  --   --   PHOS  --   --  4.2  --   --    GFR: Estimated Creatinine Clearance: 13.5  mL/min (A) (by C-G formula based on SCr of 4.45 mg/dL (H)). Liver Function Tests: Recent Labs  Lab 10/22/2017 1429 10/27/17 0345  AST 245* 231*  ALT 97* 91*  ALKPHOS 97 69  BILITOT 1.6* 1.5*  PROT 9.4* 6.9  ALBUMIN 4.1 3.0*   No results for input(s): LIPASE, AMYLASE in the last 168 hours. Recent Labs  Lab 10/23/2017 1429  AMMONIA 17   Coagulation Profile: Recent Labs  Lab 10/30/2017 1429  INR 1.09   Cardiac Enzymes: Recent Labs  Lab 10/30/2017 1429 10/27/17 0345 10/28/17 0155 10/29/17 0316  CKTOTAL 9,306* 6,917* 4,627* 1,732*   BNP (last 3 results) No results for input(s): PROBNP in the last 8760 hours. HbA1C: No results for input(s): HGBA1C in the last 72 hours. CBG: Recent Labs  Lab 10/31/2017 1435  GLUCAP 132*   Lipid Profile: No results for input(s): CHOL, HDL, LDLCALC, TRIG, CHOLHDL, LDLDIRECT  in the last 72 hours. Thyroid Function Tests: No results for input(s): TSH, T4TOTAL, FREET4, T3FREE, THYROIDAB in the last 72 hours. Anemia Panel: No results for input(s): VITAMINB12, FOLATE, FERRITIN, TIBC, IRON, RETICCTPCT in the last 72 hours. Sepsis Labs: Recent Labs  Lab 10/27/17 0050 10/27/17 0345 10/28/17 0140 10/28/17 0155 10/29/17 0316  PROCALCITON  --   --  6.99  --  3.35  LATICACIDVEN 2.6* 2.2*  --  2.1* 1.2    Recent Results (from the past 240 hour(s))  Blood Cultures (routine x 2)     Status: None (Preliminary result)   Collection Time: 10/31/2017  2:32 PM  Result Value Ref Range Status   Specimen Description   Final    BLOOD LEFT ANTECUBITAL Performed at [facility], [place] [address], [place], [place] [address]    Special Requests   Final    BOTTLES DRAWN AEROBIC AND ANAEROBIC Blood Culture adequate volume Performed at [facility] [address], [place], [place] [address]    Culture   Final    NO GROWTH 3 DAYS Performed at [facility], [place] [address] [place], [place] [address]    Report Status  PENDING  Incomplete  Gastrointestinal Panel by PCR , Stool     Status: None   Collection Time: 11/02/2017  7:00 PM  Result Value Ref Range Status   Campylobacter species NOT DETECTED NOT DETECTED Final   Plesimonas shigelloides NOT DETECTED NOT DETECTED Final   Salmonella species NOT DETECTED NOT DETECTED Final   Yersinia enterocolitica NOT DETECTED NOT DETECTED Final   Vibrio species NOT DETECTED NOT DETECTED Final   Vibrio cholerae NOT DETECTED NOT DETECTED Final   Enteroaggregative E coli (EAEC) NOT DETECTED NOT DETECTED Final   Enteropathogenic E coli (EPEC) NOT DETECTED NOT DETECTED Final   Enterotoxigenic E coli (ETEC) NOT DETECTED NOT DETECTED Final   Shiga like toxin producing E coli (STEC) NOT DETECTED NOT DETECTED Final   Shigella/Enteroinvasive E coli (EIEC) NOT DETECTED NOT DETECTED Final   Cryptosporidium NOT DETECTED NOT DETECTED Final   Cyclospora cayetanensis NOT DETECTED NOT DETECTED Final   Entamoeba histolytica NOT DETECTED NOT DETECTED Final   Giardia lamblia NOT DETECTED NOT DETECTED Final   Adenovirus F40/41 NOT DETECTED NOT DETECTED Final   Astrovirus NOT DETECTED NOT DETECTED Final   Norovirus GI/GII NOT DETECTED NOT DETECTED Final   Rotavirus A NOT DETECTED NOT DETECTED Final   Sapovirus (I, II, IV, and V) NOT DETECTED NOT DETECTED Final    Comment: Performed at [facility], [place] [place], [place] [address]  C difficile quick scan w PCR reflex     Status: None   Collection Time: 11/09/2017  7:00 PM  Result Value Ref Range Status   C Diff antigen NEGATIVE NEGATIVE Final   C Diff toxin NEGATIVE NEGATIVE Final   C Diff interpretation No C. difficile detected.  Final    Comment: Performed at [facility], [place] [address] [place], [place] [address]  Blood Cultures (routine x 2)     Status: None (Preliminary result)   Collection Time: 10/27/17 12:50 AM  Result Value Ref Range Status   Specimen Description   Final    BLOOD  RIGHT HAND Performed at [facility] [address] [place], [place] [address]    Special Requests   Final    IN PEDIATRIC BOTTLE Blood Culture adequate volume Performed at [facility]  [address] [place], [place] [address]    Culture   Final    NO GROWTH 2 DAYS Performed at [facility] [address], [place], [place] [address]    Report Status PENDING  Incomplete  Urine culture     Status: None   Collection Time: 10/27/17  3:02 PM  Result Value Ref Range Status   Specimen Description   Final    URINE, CATHETERIZED Performed at [facility] [address], [place], [place] [address]    Special Requests   Final    NONE Performed at [facility], [place] [address] [place], [place] [address]    Culture   Final    NO GROWTH Performed at [facility], [place] [address], [place], [place] [address]    Report Status 10/28/2017 FINAL  Final  MRSA PCR Screening     Status: Abnormal   Collection Time: 10/28/17  9:27 AM  Result Value Ref Range Status   MRSA by PCR POSITIVE (A) NEGATIVE Final    Comment:        The GeneXpert MRSA Assay (FDA approved for NASAL specimens only), is one component of a comprehensive MRSA colonization surveillance program. It is not intended to diagnose MRSA infection nor to guide or monitor treatment for MRSA infections. RESULT CALLED TO, READ BACK BY AND VERIFIED WITH: GROGAN,B @ [id number] TM [id number] BY POTEAT,S Performed at [facility] [address] [place], [place] [address]       Radiology Studies: US Abdomen Limited Ruq  Result Date: 10/28/2017 CLINICAL DATA:  Gallbladder stone with non acute cholecystitis. EXAM: ULTRASOUND ABDOMEN LIMITED RIGHT UPPER QUADRANT COMPARISON:  CT abdomen and pelvis 10/27/2017 FINDINGS: Gallbladder: A 2.6 cm shadowing stone is present at the neck of the gallbladder. The gallbladder is contracted. Wall is  within normal limits at 3.5 mm. There is no sonographic Murphy sign. Common bile duct: Diameter: 4.6 mm, within normal limits Liver: The liver is diffusely echogenic. No focal lesions are present. The contour is smooth. Portal vein is patent on color Doppler imaging with normal direction of blood flow towards the liver. IMPRESSION: 1. 2.6 cm shadowing gallstone without evidence for cholecystitis. 2. Hepatic steatosis. Electronically Signed   By: San Morelle M.D.   On: 10/28/2017 11:02     Scheduled Meds: . Chlorhexidine Gluconate Cloth  6 each Topical Q0600  . dicyclomine  10 mg Oral TID AC & HS  . fentaNYL (SUBLIMAZE) injection  50 mcg Intravenous Once  . levalbuterol  0.63 mg Nebulization TID  . mupirocin ointment  1 application Nasal BID  . nicotine  14 mg Transdermal Daily  . thiamine  100 mg Oral Daily   Continuous Infusions: . heparin 1,400 Units/hr (10/29/17 0500)  . piperacillin-tazobactam (ZOSYN)  IV Stopped (10/29/17 1501)  . potassium chloride 10 mEq (10/29/17 1529)  .  sodium bicarbonate (isotonic) infusion in sterile water 125 mL/hr at 10/29/17 0927  . vancomycin       LOS: 3 days    Roxan Hockey, MD [facility] Pager [phone]  If 7PM-7AM, please contact night-coverage [email or web address] Password [facility] 10/29/2017, 4:47 PM

## 2017-10-29 NOTE — Progress Notes (Signed)
    Progress Note   Assessment / Plan:   Assessment: 1. Diarrhea: continues, again likely from ischemic injury to bowels and other organs  2. Abnormal CT abdomen 3. AKI on CKD 4. Alcohol abuse 5. Protein S Deficiency:  On Xarelto outp, heparin drip here 6. Hepatitis C  Plan: 1. Continue supportive measures, if no improvement will reconsider endoscopic evaluation 2. Please await any further recs from Dr. Carlean Purl later today  Thank you for your kind consultation.    LOS: 3 days   [name]  10/29/2017, 10:25 AM  Pager # [phone]  Agree with Ms. Lemmon's evaluation and management.  Gatha Mayer, MD, [facility]     Subjective  Chief Complaint: Diarrhea, Abnormal Ct Abdomen  Pt found resting comfortably. Denies any new complaints. Tells me they are discussing dialysis for her kidneys. Tells me plans are now for her to go live with her sister when she leaves for a bit. She knows that "I can't drink another drop", once I leave here.   Objective   Vital signs in last 24 hours: Temp:  [98.2 F (36.8 C)-99.9 F (37.7 C)] 99.8 F (37.7 C) (02/16 0820) Pulse Rate:  [65-94] 68 (02/16 0600) Resp:  [18-40] 31 (02/16 0600) BP: (116-186)/(36-144) 158/63 (02/16 0600) SpO2:  [92 %-98 %] 93 % (02/16 0905) Weight:  [182 lb 1.6 oz (82.6 kg)] 182 lb 1.6 oz (82.6 kg) (02/16 0401) Last BM Date: 10/28/17 General:    Caucasian female in NAD Heart:  Regular rate and rhythm; no murmurs Lungs: Respirations even and unlabored, lungs CTA bilaterally Abdomen:  Soft, mild generalized ttp and nondistended. Normal bowel sounds. Rectal: Rectal tube in place, continues drainage of dark liquid stool Extremities:  Without edema. Neurologic:  Alert and oriented,  grossly normal neurologically. Psych:  Cooperative. Normal mood and affect.  Intake/Output from previous day: 02/15 0701 - 02/16 0700 In: 2758 [I.V.:2508; IV Piggyback:250] Out: 4132 [Urine:1800; GMWNU:2725]  Lab  Results: Recent Labs    10/27/17 0816 10/28/17 0155 10/29/17 0316  WBC 23.4* 19.5* 13.6*  HGB 16.2* 13.1 11.9*  HCT 44.8 36.3 33.5*  PLT 174 150 126*   BMET Recent Labs    10/27/17 0345 10/28/17 0155 10/29/17 0316  NA 145 143 141  K 3.4* 3.0* 3.2*  CL 111 105 100*  CO2 15* 19* 26  GLUCOSE 111* 107* 102*  BUN 62* 72* 64*  CREATININE 5.07* 5.37* 4.45*  CALCIUM 8.0* 7.9* 7.7*

## 2017-10-29 NOTE — Progress Notes (Addendum)
Pharmacy Antibiotic Note  Brooke Garcia is a 62 y.o. female admitted on 11/07/2017 with sepsis. Patient found on floor covered in stool and urine. Pharmacy has been consulted for vancomycin and zosyn dosing.  Today, 10/29/2017  Remains afebrile, + hypothermia  SCr a little better.   WBCs and procalcitonin improving  Lactate and CK improving  Random vanc level today indicates a half-life of approx. 40 hrs. With it at top end of the trough range, another dose will be needed today if vanc is to continue. -- discussed with Dr. Denton Brick  Plan:  Per discussion with Dr. Denton Brick, give one more dose of Vanc then discontinue. Will give Vanc 1g tonight.   Continue Zosyn 2.25gm IV q6h based on current renal function  Daily SCr  Await culture results to de-escalate  Height: 5\' 3"  (160 cm) Weight: 182 lb 1.6 oz (82.6 kg) IBW/kg (Calculated) : 52.4  Temp (24hrs), Avg:98.9 F (37.2 C), Min:98 F (36.7 C), Max:99.9 F (37.7 C)  Recent Labs  Lab 10/30/2017 1429  10/20/2017 1451 11/05/2017 1736 10/27/17 0050  10/27/17 0345 10/27/17 0816 10/28/17 0155 10/29/17 0316 10/29/17 1143  WBC 30.5*  --   --   --   --   --   --  23.4* 19.5* 13.6*  --   CREATININE 5.10*  --  4.90*  --   --   --  5.07*  --  5.37* 4.45*  --   LATICACIDVEN  --    < > 5.79* 3.81* 2.6*  --  2.2*  --  2.1* 1.2  --   VANCORANDOM  --   --   --   --   --    < > 53*  --  36  --  20   < > = values in this interval not displayed.    Estimated Creatinine Clearance: 13.5 mL/min (A) (by C-G formula based on SCr of 4.45 mg/dL (H)).    Allergies  Allergen Reactions  . Butalbital-Apap-Caffeine Hives  . Naproxen Hives  . Tylenol [Acetaminophen] Other (See Comments)    Upset stomach    Antimicrobials this admission: 2/13 vancomycin >> 2/13 zosyn >>   Dose adjustments this admission: 2/14 VR = 53 (~4hr after 2g dose), SCr up to 5 so will recheck VR 2/15 AM 2/15 VR = 36 2/16 VR = 20 (T1/2 ~40hr)   Microbiology  results: 2/13 C. Difficile: neg 2/13 GI panel: neg 2/13 BCx: NGTD 2/13 UCx: NG final 2/14 BCx: NGTD 2/15 MRSA PCR: positive   Thank you for allowing pharmacy to be a part of this patient's care.  Romeo Rabon, PharmD. Mobile: [phone]. 10/29/2017,12:58 PM.

## 2017-10-29 NOTE — Progress Notes (Signed)
PULMONARY / CRITICAL CARE MEDICINE   Name: Brooke Garcia MRN: [id number] DOB: May 22, 1956    ADMISSION DATE:  11/04/2017 CONSULTATION DATE:  11/07/2017  REFERRING MD: Dr. Tawanna Solo  CHIEF COMPLAINT:  Altered Mental Status  HISTORY OF PRESENT ILLNESS:  62 year old female found down in a hotel room in an altered mental state laying in her own stool and urine. She was brought to the ED for evaluation and suspected to have rhabdomyolysis. She has a past medical history significant for alcohol and tobacco abuse and reportedly on xarelto for DVTs.   Initial labs showed a lactic acid of 5.79, elevated CK of 9,306, elevated liver enzymes, hypokalemia of 2.9 and AKI with Cr of 5.10 and BUN of 58. ED imaging was negative for acute processes of the head, chest but showed some evidence of acute cholecystitis on CT abdomen. She was admitted for further management.   SUBJECTIVE: No issues overnight.  Patient sleeping  VITAL SIGNS: BP (!) 158/63   Pulse 68   Temp 98.4 F (36.9 C) (Oral)   Resp (!) 31   Ht 5\' 3"  (1.6 m)   Wt 182 lb 1.6 oz (82.6 kg)   SpO2 94%   BMI 32.26 kg/m        INTAKE / OUTPUT: I/O last 3 completed shifts: In: 4137.2 [I.V.:3887.2; IV Piggyback:250] Out: 0347 [Urine:1800; Stool:1525]  PHYSICAL EXAMINATION: %. Gen:      No acute distress HEENT:  EOMI, sclera anicteric Neck:     No masses; no thyromegaly Lungs:    Clear to auscultation bilaterally; normal respiratory effort CV:         Regular rate and rhythm; no murmurs Abd:      + bowel sounds; soft, non-tender; no palpable masses, no distension Ext:    No edema; adequate peripheral perfusion Skin:      Warm and dry; no rash Neuro: No focal deficits  LABS:  BMET Recent Labs  Lab 10/27/17 0345 10/28/17 0155 10/29/17 0316  NA 145 143 141  K 3.4* 3.0* 3.2*  CL 111 105 100*  CO2 15* 19* 26  BUN 62* 72* 64*  CREATININE 5.07* 5.37* 4.45*  GLUCOSE 111* 107* 102*    Electrolytes Recent Labs  Lab  10/27/17 0345 10/28/17 0155 10/29/17 0316  CALCIUM 8.0* 7.9* 7.7*  MG 1.9  --   --   PHOS 4.2  --   --     CBC Recent Labs  Lab 10/27/17 0816 10/28/17 0155 10/29/17 0316  WBC 23.4* 19.5* 13.6*  HGB 16.2* 13.1 11.9*  HCT 44.8 36.3 33.5*  PLT 174 150 126*    Coag's Recent Labs  Lab 10/30/2017 1429 10/27/17 1742  APTT  --  34  INR 1.09  --     Sepsis Markers Recent Labs  Lab 10/27/17 0345 10/28/17 0140 10/28/17 0155 10/29/17 0316  LATICACIDVEN 2.2*  --  2.1* 1.2  PROCALCITON  --  6.99  --  3.35    ABG Recent Labs  Lab 11/06/2017 1430 10/25/2017 1920  PHART 7.476* 7.421  PCO2ART below reportable range 21.6*  PO2ART 75.8* 85.7    Liver Enzymes Recent Labs  Lab 11/07/2017 1429 10/27/17 0345  AST 245* 231*  ALT 97* 91*  ALKPHOS 97 69  BILITOT 1.6* 1.5*  ALBUMIN 4.1 3.0*    Cardiac Enzymes No results for input(s): TROPONINI, PROBNP in the last 168 hours.  Glucose Recent Labs  Lab 11/10/2017 1435  GLUCAP 132*    Imaging US Abdomen  Limited Ruq  Result Date: 10/28/2017 CLINICAL DATA:  Gallbladder stone with non acute cholecystitis. EXAM: ULTRASOUND ABDOMEN LIMITED RIGHT UPPER QUADRANT COMPARISON:  CT abdomen and pelvis 10/27/2017 FINDINGS: Gallbladder: A 2.6 cm shadowing stone is present at the neck of the gallbladder. The gallbladder is contracted. Wall is within normal limits at 3.5 mm. There is no sonographic Murphy sign. Common bile duct: Diameter: 4.6 mm, within normal limits Liver: The liver is diffusely echogenic. No focal lesions are present. The contour is smooth. Portal vein is patent on color Doppler imaging with normal direction of blood flow towards the liver. IMPRESSION: 1. 2.6 cm shadowing gallstone without evidence for cholecystitis. 2. Hepatic steatosis. Electronically Signed   By: San Morelle M.D.   On: 10/28/2017 11:02    STUDIES: CT Head 2/13 >> negative  CT Cervical Spine 2/13 >> multilevel degenerative changes, no acute CT  ABD/Pelvis 2/13 >> 2.7 cm calculus within the dependent portion of the gallbladder, no evidence for acute cholecystitis, pan enterocolitis  CULTURES: BCx2 2/13 >>  GI PCR 2/13 >> negative  ANTIBIOTICS: Zosyn 2/13 >>    LINES/TUBES:   ASSESSMENT / PLAN:  NEUROLOGY: A:  Acute Metabolic Encephalopathy - resolving  ? Alcohol withdrawl P: CIWA protocol, PRN ativan  Supportive care, improving   RENAL: A: AKI - suspect hypoperfusion event Rhabdomyolysis - initial CK 9,306 Hypokalemia P: Per Nephrology  No indication for acute HD at this time  Renal function and CK is better. LA has normalized Avoid nephrotoxic agents   INFECTIOUS: A: SIRS Diarrhea - doubt infectious etiology, suspect ischemic insult from hypotension  P: Trend CBC / fever curve  Monitor diarrhea output  Per Primary  On zosyn. Pct is improving  HEMATOLOGY: A: Hypercoagulable state with hx of Protein S Deficiency Hx DVT  P: Heparin per pharmacy  Defer transition back to oral agents to primary   PCCM will be available PRN.  Please call back if new needs arise.   [name] MD [facility] Pager [phone] If no answer or after 3pm call: [phone] 10/29/2017, 7:37 AM

## 2017-10-30 ENCOUNTER — Inpatient Hospital Stay (HOSPITAL_COMMUNITY): Payer: Medicare Other

## 2017-10-30 LAB — BASIC METABOLIC PANEL
Anion gap: 12 (ref 5–15)
Anion gap: 14 (ref 5–15)
BUN: 48 mg/dL — AB (ref 6–20)
BUN: 42 mg/dL — ABNORMAL HIGH (ref 6–20)
CHLORIDE: 94 mmol/L — AB (ref 101–111)
CHLORIDE: 92 mmol/L — AB (ref 101–111)
CO2: 32 mmol/L (ref 22–32)
CO2: 31 mmol/L (ref 22–32)
CREATININE: 2.95 mg/dL — AB (ref 0.44–1.00)
Calcium: 7.4 mg/dL — ABNORMAL LOW (ref 8.9–10.3)
Calcium: 7.4 mg/dL — ABNORMAL LOW (ref 8.9–10.3)
Creatinine, Ser: 3.22 mg/dL — ABNORMAL HIGH (ref 0.44–1.00)
GFR calc Af Amer: 17 mL/min — ABNORMAL LOW (ref >60)
GFR calc Af Amer: 19 mL/min — ABNORMAL LOW (ref >60)
GFR calc non Af Amer: 14 mL/min — ABNORMAL LOW (ref >60)
GFR calc non Af Amer: 16 mL/min — ABNORMAL LOW (ref >60)
GLUCOSE: 99 mg/dL (ref 65–99)
Glucose, Bld: 139 mg/dL — ABNORMAL HIGH (ref 65–99)
POTASSIUM: 3.5 mmol/L (ref 3.5–5.1)
Potassium: 3.4 mmol/L — ABNORMAL LOW (ref 3.5–5.1)
SODIUM: 137 mmol/L (ref 135–145)
Sodium: 138 mmol/L (ref 135–145)

## 2017-10-30 LAB — CBC
HEMATOCRIT: 32 % — AB (ref 36.0–46.0)
HEMATOCRIT: 28.8 % — AB (ref 36.0–46.0)
HEMOGLOBIN: 9.9 g/dL — AB (ref 12.0–15.0)
Hemoglobin: 11.1 g/dL — ABNORMAL LOW (ref 12.0–15.0)
MCH: 33.8 pg (ref 26.0–34.0)
MCH: 33.6 pg (ref 26.0–34.0)
MCHC: 34.7 g/dL (ref 30.0–36.0)
MCHC: 34.4 g/dL (ref 30.0–36.0)
MCV: 97.6 fL (ref 78.0–100.0)
MCV: 97.6 fL (ref 78.0–100.0)
Platelets: 129 10*3/uL — ABNORMAL LOW (ref 150–400)
Platelets: 201 10*3/uL (ref 150–400)
RBC: 3.28 MIL/uL — ABNORMAL LOW (ref 3.87–5.11)
RBC: 2.95 MIL/uL — AB (ref 3.87–5.11)
RDW: 13.1 % (ref 11.5–15.5)
RDW: 13.2 % (ref 11.5–15.5)
WBC: 10.8 10*3/uL — AB (ref 4.0–10.5)
WBC: 15.8 10*3/uL — ABNORMAL HIGH (ref 4.0–10.5)

## 2017-10-30 LAB — TROPONIN I: Troponin I: 0.05 ng/mL (ref <0.03)

## 2017-10-30 LAB — PROCALCITONIN: Procalcitonin: 1.87 ng/mL

## 2017-10-30 LAB — HEPARIN LEVEL (UNFRACTIONATED)
Heparin Unfractionated: 0.71 IU/mL — ABNORMAL HIGH (ref 0.30–0.70)
Heparin Unfractionated: 0.6 IU/mL (ref 0.30–0.70)

## 2017-10-30 LAB — CK: Total CK: 724 U/L — ABNORMAL HIGH (ref 38–234)

## 2017-10-30 MED ORDER — LOPERAMIDE HCL 2 MG PO CAPS
4.0000 mg | ORAL_CAPSULE | Freq: Once | ORAL | Status: AC
Start: 2017-10-30 — End: 2017-10-30
  Administered 2017-10-30: 4 mg via ORAL
  Filled 2017-10-30: qty 2

## 2017-10-30 MED ORDER — LOPERAMIDE HCL 2 MG PO CAPS
2.0000 mg | ORAL_CAPSULE | Freq: Four times a day (QID) | ORAL | Status: DC | PRN
  Administered 2017-10-30: 2 mg via ORAL
  Filled 2017-10-30: qty 1

## 2017-10-30 MED ORDER — POTASSIUM CHLORIDE CRYS ER 20 MEQ PO TBCR
40.0000 meq | EXTENDED_RELEASE_TABLET | Freq: Once | ORAL | Status: AC
  Administered 2017-10-30: 40 meq via ORAL
  Filled 2017-10-30: qty 2

## 2017-10-30 MED ORDER — FENTANYL CITRATE (PF) 100 MCG/2ML IJ SOLN
50.0000 ug | Freq: Once | INTRAMUSCULAR | Status: AC
  Administered 2017-10-30: 50 ug via INTRAVENOUS
  Filled 2017-10-30: qty 2

## 2017-10-30 NOTE — Progress Notes (Addendum)
ANTICOAGULATION CONSULT NOTE -  Pharmacy Consult for Heparin Indication: History of DVTs  Allergies  Allergen Reactions  . Butalbital-Apap-Caffeine Hives  . Naproxen Hives  . Tylenol [Acetaminophen] Other (See Comments)    Upset stomach    Patient Measurements: Height: 5\' 3"  (160 cm) Weight: 182 lb 1.6 oz (82.6 kg) IBW/kg (Calculated) : 52.4  Height: 63 inches Weight: 88.5kg (Dec 2017) Heparin Dosing Weight: 72.4kg  Vital Signs: Temp: 99.1 F (37.3 C) (02/17 1200) Temp Source: Oral (02/17 1200) BP: 165/79 (02/17 1018) Pulse Rate: 83 (02/17 1018)  Labs: Recent Labs    10/27/17 1742  10/28/17 0155  10/29/17 0316 10/30/17 0515 10/30/17 1346  HGB  --    < > 13.1  --  11.9* 11.1*  --   HCT  --   --  36.3  --  33.5* 32.0*  --   PLT  --   --  150  --  126* 129*  --   APTT 34  --   --   --   --   --   --   HEPARINUNFRC 0.19*  --  0.59   < > 0.59 0.71* 0.60  CREATININE  --   --  5.37*  --  4.45* 3.22*  --   CKTOTAL  --   --  3,875*  --  1,732* 724*  --    < > = values in this interval not displayed.    Estimated Creatinine Clearance: 18.7 mL/min (A) (by C-G formula based on SCr of 3.22 mg/dL (H)).   Medical History: Past Medical History:  Diagnosis Date  . Acute venous embolism and thrombosis of unspecified deep vessels of lower extremity   . Anxiety state, unspecified   . Arthritis   . Blood dyscrasia    protein def.  . Calculus of kidney   . Colitis   . Complete rupture of rotator cuff   . COPD (chronic obstructive pulmonary disease) ([place])   . Degeneration of intervertebral disc, site unspecified   . Depression   . DVT (deep venous thrombosis) ([place])   . Family history of ischemic heart disease   . History of gallstones   . HLD (hyperlipidemia)   . HTN (hypertension)   . Other and unspecified noninfectious gastroenteritis and colitis(558.9)   . Painful respiration   . Primary hypercoagulable state ([place])   . Protein S deficiency ([place])   . Renal failure    . Tobacco use disorder   . Unspecified urinary incontinence   . Venous stasis ulcers (HCC)    left leg     Medications:  Scheduled:  . Chlorhexidine Gluconate Cloth  6 each Topical Q0600  . dicyclomine  10 mg Oral TID AC & HS  . fentaNYL (SUBLIMAZE) injection  50 mcg Intravenous Once  . levalbuterol  0.63 mg Nebulization TID  . mupirocin ointment  1 application Nasal BID  . nicotine  14 mg Transdermal Daily  . potassium chloride  40 mEq Oral Once  . thiamine  100 mg Oral Daily   Infusions:  . heparin 1,300 Units/hr (10/30/17 0820)  . piperacillin-tazobactam (ZOSYN)  IV 2.25 g (10/30/17 1215)  .  sodium bicarbonate (isotonic) infusion in sterile water 125 mL/hr at 10/30/17 0602   PRN: iopamidol, labetalol, levalbuterol, loperamide, LORazepam, morphine injection  Assessment: 62 yo female admitted 2/13 with altered mental status and severe sepsis of unknown etiology after being found in a hotel.  Patient was on Xarelto for history of DVTs.  Pharmacy consulted  to dose IV heparin since cannot use Xarelto with acute renal failure  Today, 10/30/2017  Heparin level slightly supratherapeutic now on 1400 units/hr  Hgb and Plts trending down. No bleeding reported/documented by nursing.  SCr improving.  Goal of Therapy:  Heparin level 0.3-0.7 units/ml  Monitor platelets by anticoagulation protocol: Yes   Plan:   Decrease heparin IV infusion at 1300 units/hr  Heparin level in 6 hrs  Daily heparin level and CBC  Romeo Rabon, PharmD. [place]: [phone]. 10/30/2017,2:43 PM.  Addendum: Heparin level within therapeutic range now on 1300 units/hr Cont same. F/u in AM.  Romeo Rabon, PharmD. [place]: [phone]. 10/30/2017,2:44 PM.

## 2017-10-30 NOTE — Progress Notes (Signed)
PROGRESS NOTE    TELISA OHLSEN  HYW:[id number] DOB: 27-Aug-1956 DOA: 10/18/2017 PCP: Janith Lima, MD   Assessment & Plan:   Principal Problem:   Acute kidney injury superimposed on CKD ([place]) Active Problems:   TOBACCO ABUSE   HYPERTENSION, BENIGN ESSENTIAL   DVT of lower extremity, bilateral (HCC)   COPD, moderate (HCC)   Acute renal failure (HCC)   Leucocytosis   Chronic hepatitis C without hepatic coma (HCC)   Chronic renal disease, stage 3, moderately decreased glomerular filtration rate (GFR) between 30-59 mL/min/1.73 square meter (HCC)   Loose stools   Severe sepsis (HCC)   Rhabdomyolysis   Hypokalemia   Altered mental status   Cystocele with prolapse   Elevated liver enzymes   Diarrhea   Abnormal CT scan, colon   Enterocolitis  Brief Narrative: JANALYN HIGBY is a 62 y.o. female with medical history significant of hypertension, COPD, CKD, DVT, hepatitis C,uterine prolapse, thoracic abdominal aneurysm, hypercoagulable state who was brought to the emergency department after she was found on the floor. Patient found to have altered mental status ,diarrhea, acute renal failure, lactic acidosis, severe leukocytosis and elevated CPK on presentation.Sepsis was also suspected and she has been started on broad-spectrum antibiotics.  PCCM ,nephrology and GI following.   Plan:-  1)Acute kidney injury on CKD: Improved, creatinine down to 3.2 from 5.37 , her creatinine on 09/08/17 was 1.3, Now  Creatinine is  > 4.  Most likely this is prerenal AKI due to volume depletion versus acute tubular necrosis due to rhabdo.Nephrology following, additional IV fluids/bicarb drip, urology placed Foley catheter placement, urine output improving  2)Possible sepsis: Unknown etiology .   discussed with pharmacist, continue IV Zosyn adjusted for renal function, last dose of  IV vancomycin on 10/29/2017 (q  36 h dosing), negative cultures so far, MRSA PCR screen was +Ve, leukocytosis (down to10.8K  from 30,000), lactic acid (down to 1.2 from 2.1) and pro-calcitonin (pro calcitonin is down to 1.8 from 6.9) have all  improved with antibiotics.   No evidence of UTI or pneumonia at this time  3)Altered mental status: Now mostly resolved, suspect patient had a toxic metabolic encephalopathy due to AK I, dehydration, and polypharmacy (UDS with opiates and benzos ). bCT head without acute findings  4)Enterocolitis/Diarrhoea: Slowly improving, stool studies negative for C. difficile, GI pathogen panel.  c/n  rectal tube. ,  Use as needed Imodium,  CT abdomen/pelvis without contrast showed pan enterocolitis, continue Zosyn,  GI suspecting ischemic colitis in the setting of hypertension, cholelithiasis without cholecystitis noted on abdominal imaging  5)Fall/Rhabdomyolysis:   Continue bicarb drip, CK down to 700 from over 4600  6)Smoker/history of COPD: Currently COPD is stable.  No acute exacerbation at this time continue bronchodilators  7)History of DVT/hypercoagulability/ ? Protein S deficiency: On Xarelto at home, .  C/N  on heparin drip due to AKI, consider repeat steroid to prior to discharge if renal function improves and  8)HTN- stable, continue to hold irbesatan due to kidney concerns   9)Hep C: Not on treatment.Follow up with GI as an outpatient, patient admits to history of prior IV drug use  10)Alcoholism: Patient has H/O chronic alcoholism.no acute disease at this time, continue thiamine and folic acid    DVT prophylaxis:Heparin Code Status: Full  Disposition Plan: Unknown at this time  Consultants: Nephrology,PCCM/Gi  Procedures: None  Antimicrobials: Vancomycin (last dose 10/29/17) and Zosyn since 11/03/2017  Subjective:  RN kim at bedside, no new concerns, tolerating  oral intake well diarrhea persist , no fevers  Objective: Vitals:   10/30/17 0800 10/30/17 0940 10/30/17 1016 10/30/17 1018  BP: (!) 144/109  (!) 165/79 (!) 165/79  Pulse: 89  73 83  Resp: (!) 22  (!)  27 20  Temp:      TempSrc:      SpO2: 96% 95% 94% 96%  Weight:      Height:        Intake/Output Summary (Last 24 hours) at 10/30/2017 1414 Last data filed at 10/30/2017 0602 Gross per 24 hour  Intake 3833.75 ml  Output 1350 ml  Net 2483.75 ml   Filed Weights   10/28/17 0600 10/29/17 0401  Weight: 88.5 kg (195 lb 1.7 oz) 82.6 kg (182 lb 1.6 oz)   Physical Exam  Gen:- Awake Alert,  In no apparent distress  HEENT:- [place].AT, No sclera icterus Neck-Supple Neck,No JVD,.  Lungs-  CTAB  CV- S1, S2 normal Abd-  +ve B.Sounds, Abd Soft, No tenderness, no CVA tenderness    Extremity/Skin:- No  edema,   Rectal-rectal tube noted GU-Foley catheter noted Psych-affect is appropriate   CBC: Recent Labs  Lab 10/25/2017 1429 10/25/2017 1451 10/27/17 0816 10/28/17 0155 10/29/17 0316 10/30/17 0515  WBC 30.5*  --  23.4* 19.5* 13.6* 10.8*  NEUTROABS 26.2*  --  19.5* 16.3*  --   --   HGB 18.1* 18.0* 16.2* 13.1 11.9* 11.1*  HCT 48.4* 53.0* 44.8 36.3 33.5* 32.0*  MCV 94.2  --  95.5 94.5 95.4 97.6  PLT 266  --  174 150 126* 854*   Basic Metabolic Panel: Recent Labs  Lab 10/14/2017 1429 10/25/2017 1451 10/27/17 0345 10/28/17 0155 10/29/17 0316 10/30/17 0515  NA 144 144 145 143 141 138  K 2.9* 2.8* 3.4* 3.0* 3.2* 3.4*  CL 100* 103 111 105 100* 94*  CO2 18*  --  15* 19* 26 32  GLUCOSE 138* 126* 111* 107* 102* 99  BUN 58* 51* 62* 72* 64* 48*  CREATININE 5.10* 4.90* 5.07* 5.37* 4.45* 3.22*  CALCIUM 9.8  --  8.0* 7.9* 7.7* 7.4*  MG  --   --  1.9  --   --   --   PHOS  --   --  4.2  --   --   --    GFR: Estimated Creatinine Clearance: 18.7 mL/min (A) (by C-G formula based on SCr of 3.22 mg/dL (H)). Liver Function Tests: Recent Labs  Lab 11/05/2017 1429 10/27/17 0345  AST 245* 231*  ALT 97* 91*  ALKPHOS 97 69  BILITOT 1.6* 1.5*  PROT 9.4* 6.9  ALBUMIN 4.1 3.0*   No results for input(s): LIPASE, AMYLASE in the last 168 hours. Recent Labs  Lab 10/27/2017 1429  AMMONIA 17    Coagulation Profile: Recent Labs  Lab 11/10/2017 1429  INR 1.09   Cardiac Enzymes: Recent Labs  Lab 10/28/2017 1429 10/27/17 0345 10/28/17 0155 10/29/17 0316 10/30/17 0515  CKTOTAL 9,306* 6,917* 4,627* 1,732* 724*   BNP (last 3 results) No results for input(s): PROBNP in the last 8760 hours. HbA1C: No results for input(s): HGBA1C in the last 72 hours. CBG: Recent Labs  Lab 10/19/2017 1435  GLUCAP 132*   Lipid Profile: No results for input(s): CHOL, HDL, LDLCALC, TRIG, CHOLHDL, LDLDIRECT in the last 72 hours. Thyroid Function Tests: No results for input(s): TSH, T4TOTAL, FREET4, T3FREE, THYROIDAB in the last 72 hours. Anemia Panel: No results for input(s): VITAMINB12, FOLATE, FERRITIN, TIBC, IRON, RETICCTPCT in the last 72  hours. Sepsis Labs: Recent Labs  Lab 10/27/17 0050 10/27/17 0345 10/28/17 0140 10/28/17 0155 10/29/17 0316 10/30/17 0515  PROCALCITON  --   --  6.99  --  3.35 1.87  LATICACIDVEN 2.6* 2.2*  --  2.1* 1.2  --     Recent Results (from the past 240 hour(s))  Blood Cultures (routine x 2)     Status: None (Preliminary result)   Collection Time: 10/28/2017  2:32 PM  Result Value Ref Range Status   Specimen Description   Final    BLOOD LEFT ANTECUBITAL Performed at [facility], [place] [address], [place], [place] [address]    Special Requests   Final    BOTTLES DRAWN AEROBIC AND ANAEROBIC Blood Culture adequate volume Performed at [facility] [address] [place], [place] [address]    Culture   Final    NO GROWTH 4 DAYS Performed at [facility], [place] [address] [place], [place] [address]    Report Status PENDING  Incomplete  Gastrointestinal Panel by PCR , Stool     Status: None   Collection Time: 10/28/2017  7:00 PM  Result Value Ref Range Status   Campylobacter species NOT DETECTED NOT DETECTED Final   Plesimonas shigelloides NOT DETECTED NOT DETECTED Final   Salmonella species NOT DETECTED NOT  DETECTED Final   Yersinia enterocolitica NOT DETECTED NOT DETECTED Final   Vibrio species NOT DETECTED NOT DETECTED Final   Vibrio cholerae NOT DETECTED NOT DETECTED Final   Enteroaggregative E coli (EAEC) NOT DETECTED NOT DETECTED Final   Enteropathogenic E coli (EPEC) NOT DETECTED NOT DETECTED Final   Enterotoxigenic E coli (ETEC) NOT DETECTED NOT DETECTED Final   Shiga like toxin producing E coli (STEC) NOT DETECTED NOT DETECTED Final   Shigella/Enteroinvasive E coli (EIEC) NOT DETECTED NOT DETECTED Final   Cryptosporidium NOT DETECTED NOT DETECTED Final   Cyclospora cayetanensis NOT DETECTED NOT DETECTED Final   Entamoeba histolytica NOT DETECTED NOT DETECTED Final   Giardia lamblia NOT DETECTED NOT DETECTED Final   Adenovirus F40/41 NOT DETECTED NOT DETECTED Final   Astrovirus NOT DETECTED NOT DETECTED Final   Norovirus GI/GII NOT DETECTED NOT DETECTED Final   Rotavirus A NOT DETECTED NOT DETECTED Final   Sapovirus (I, II, IV, and V) NOT DETECTED NOT DETECTED Final    Comment: Performed at [facility], [place] [place], [place] [address]  C difficile quick scan w PCR reflex     Status: None   Collection Time: 11/05/2017  7:00 PM  Result Value Ref Range Status   C Diff antigen NEGATIVE NEGATIVE Final   C Diff toxin NEGATIVE NEGATIVE Final   C Diff interpretation No C. difficile detected.  Final    Comment: Performed at [facility], [place] [address] [place], [place] [address]  Blood Cultures (routine x 2)     Status: None (Preliminary result)   Collection Time: 10/27/17 12:50 AM  Result Value Ref Range Status   Specimen Description   Final    BLOOD RIGHT HAND Performed at [facility] [address], [place], [place] [address]    Special Requests   Final    IN PEDIATRIC BOTTLE Blood Culture adequate volume Performed at [facility] [address], [place], [place] [address]    Culture   Final    NO  GROWTH 3 DAYS Performed at [facility], [place] [address], [place], [place] [address]  Report Status PENDING  Incomplete  Urine culture     Status: None   Collection Time: 10/27/17  3:02 PM  Result Value Ref Range Status   Specimen Description   Final    URINE, CATHETERIZED Performed at [facility] [address], [place], [place] [address]    Special Requests   Final    NONE Performed at [facility], [place] [address] [place], [place] [address]    Culture   Final    NO GROWTH Performed at [facility], [place] [address] [place], [place] [address]    Report Status 10/28/2017 FINAL  Final  MRSA PCR Screening     Status: Abnormal   Collection Time: 10/28/17  9:27 AM  Result Value Ref Range Status   MRSA by PCR POSITIVE (A) NEGATIVE Final    Comment:        The GeneXpert MRSA Assay (FDA approved for NASAL specimens only), is one component of a comprehensive MRSA colonization surveillance program. It is not intended to diagnose MRSA infection nor to guide or monitor treatment for MRSA infections. RESULT CALLED TO, READ BACK BY AND VERIFIED WITH: GROGAN,B @ 6808 UP [id number] BY POTEAT,S Performed at [facility] [address] [place], [place] [address]       Radiology Studies: No results found.   Scheduled Meds: . Chlorhexidine Gluconate Cloth  6 each Topical Q0600  . dicyclomine  10 mg Oral TID AC & HS  . fentaNYL (SUBLIMAZE) injection  50 mcg Intravenous Once  . levalbuterol  0.63 mg Nebulization TID  . mupirocin ointment  1 application Nasal BID  . nicotine  14 mg Transdermal Daily  . potassium chloride  40 mEq Oral Once  . thiamine  100 mg Oral Daily   Continuous Infusions: . heparin 1,300 Units/hr (10/30/17 0820)  . piperacillin-tazobactam (ZOSYN)  IV 2.25 g (10/30/17 1215)  .  sodium bicarbonate (isotonic) infusion in sterile water 125 mL/hr at 10/30/17 0602     LOS: 4 days    Roxan Hockey, MD [facility] Pager [phone]  If 7PM-7AM, please contact night-coverage [email or web address] Password [facility] 10/30/2017, 2:14 PM

## 2017-10-30 NOTE — Progress Notes (Signed)
Progress Note   Assessment / Plan:   Assessment: 1.  Diarrhea: Patient was having some solid stool yesterday, rectal tube was pulled, watery stool again after breakfast again likely from ischemic injury during episode of hypotension 2.  Abnormal CT abdomen 3.  AK I on CKD 4.  Alcohol abuse 5.  Protein S deficiency: On Xarelto outpatient, heparin drip.   6.  Hepatitis C  Plan: 1.  Continue supportive measures.  Likely patient will need rectal tube replaced today as she does continue would liquid stool and is not able to get on a bedpan quick enough. 2.  Alerted staff to help clean the patient. 3.  Please await any further recommendations from Dr. Carlean Purl later today  Thank you for your kind consultation, we will continue to follow.   LOS: 4 days   [name]  10/30/2017, 8:19 AM  Pager # [phone]    [facility] Attending   I have taken an interval history, reviewed the chart and examined the patient. I agree with the Advanced Practitioner's note, impression and recommendations.   Diarrhea persists - I think from ischemia  If not getting any better consider flex sig but would continue supportive care for a while  Gatha Mayer, MD, [facility] [phone] (pager) 10/30/2017 2:45 PM     Subjective  Chief Complaint: Diarrhea, abnormal CT of the abdomen  This morning patient is found sitting up in bed, she just ate breakfast.  Tells me she is having a large amount of watery stool again.  Apparently this was solid last night when they pulled out her rectal tube, but since eating this morning she has started again with loose stools.  On appearance there does appear to be occasional clumps of solid stool mixed in.   Objective   Vital signs in last 24 hours: Temp:  [97.7 F (36.5 C)-99.8 F (37.7 C)] 98.7 F (37.1 C) (02/17 0704) Pulse Rate:  [70-82] 79 (02/17 0400) Resp:  [21-41] 30 (02/17 0400) BP: (104-151)/(46-93) 145/83 (02/17  0400) SpO2:  [92 %-97 %] 94 % (02/17 0400) Last BM Date: 10/29/17 General:    Caucasian female in NAD Heart:  Regular rate and rhythm; no murmurs Lungs: Respirations even and unlabored, lungs CTA bilaterally Abdomen:  Soft, mild generalized ttp and nondistended. Normal bowel sounds. Extremities:  Without edema. Neurologic:  Alert and oriented,  grossly normal neurologically. Psych:  Cooperative. Normal mood and affect.  Intake/Output from previous day: 02/16 0701 - 02/17 0700 In: 3833.8 [P.O.:120; I.V.:3363.8; IV Piggyback:350] Out: 1710 [Urine:1410; Stool:300] Intake/Output this shift: No intake/output data recorded.  Lab Results: Recent Labs    10/28/17 0155 10/29/17 0316 10/30/17 0515  WBC 19.5* 13.6* 10.8*  HGB 13.1 11.9* 11.1*  HCT 36.3 33.5* 32.0*  PLT 150 126* 129*   BMET Recent Labs    10/28/17 0155 10/29/17 0316 10/30/17 0515  NA 143 141 138  K 3.0* 3.2* 3.4*  CL 105 100* 94*  CO2 19* 26 32  GLUCOSE 107* 102* 99  BUN 72* 64* 48*  CREATININE 5.37* 4.45* 3.22*  CALCIUM 7.9* 7.7* 7.4*   Studies/Results: US Abdomen Limited Ruq  Result Date: 10/28/2017 CLINICAL DATA:  Gallbladder stone with non acute cholecystitis. EXAM: ULTRASOUND ABDOMEN LIMITED RIGHT UPPER QUADRANT COMPARISON:  CT abdomen and pelvis 10/27/2017 FINDINGS: Gallbladder: A 2.6 cm shadowing stone is present at the neck of the gallbladder. The gallbladder is contracted. Wall is within normal limits at 3.5 mm. There is no  sonographic [place] sign. Common bile duct: Diameter: 4.6 mm, within normal limits Liver: The liver is diffusely echogenic. No focal lesions are present. The contour is smooth. Portal vein is patent on color Doppler imaging with normal direction of blood flow towards the liver. IMPRESSION: 1. 2.6 cm shadowing gallstone without evidence for cholecystitis. 2. Hepatic steatosis. Electronically Signed   By: [place] Morelle M.D.   On: 10/28/2017 11:02

## 2017-10-30 NOTE — Progress Notes (Signed)
[facility] ROUNDING NOTE   Subjective:   This is a 62 year old lady that was found in semi conscious state in her excrement by family. She resides in a motel room and was encephalopathic and hypotensive on presentation. She has a history of alcohol abuse and drinks daily. Their is the possibility of a thoracic aneurysm on report and she has a protein s deficiency and hepatitis C In Feb 2018 , her renal function was in the normal range   On presentation she was hypotensive and responded rapidly to a fluid bolus  Her creatinine was 5.1   and white blood count 30.5 Her CK  Continues to trend down  Ammonia was normal and ethanol undetectable  Troponin increased slightly 0.48  Lactate level was 2.2   Chest CT performed 0/27 showed uncomplicated aneurysm 4.2 cm  She has been started on vancomycin and zosyn  Renal function is improved creatinine 3.22  Urine output improved     Will continue to follow    Objective:  Vital signs in last 24 hours:  Temp:  [97.7 F (36.5 C)-98.7 F (37.1 C)] 98.7 F (37.1 C) (02/17 0704) Pulse Rate:  [70-89] 83 (02/17 1018) Resp:  [20-41] 20 (02/17 1018) BP: (104-165)/(46-109) 165/79 (02/17 1018) SpO2:  [92 %-97 %] 96 % (02/17 1018)  Weight change:  Filed Weights   10/28/17 0600 10/29/17 0401  Weight: 195 lb 1.7 oz (88.5 kg) 182 lb 1.6 oz (82.6 kg)    Intake/Output: I/O last 3 completed shifts: In: 5137.8 [P.O.:120; I.V.:4617.8; IV Piggyback:400] Out: 2536 [Urine:2410; Stool:800]   Intake/Output this shift:  No intake/output data recorded.  CVS- RRR RS- CTA ABD- BS present soft non-distended EXT- no edema   Basic Metabolic Panel: Recent Labs  Lab 10/25/2017 1429 11/08/2017 1451 10/27/17 0345 10/28/17 0155 10/29/17 0316 10/30/17 0515  NA 144 144 145 143 141 138  K 2.9* 2.8* 3.4* 3.0* 3.2* 3.4*  CL 100* 103 111 105 100* 94*  CO2 18*  --  15* 19* 26 32  GLUCOSE 138* 126* 111* 107* 102* 99  BUN 58* 51* 62* 72* 64* 48*   CREATININE 5.10* 4.90* 5.07* 5.37* 4.45* 3.22*  CALCIUM 9.8  --  8.0* 7.9* 7.7* 7.4*  MG  --   --  1.9  --   --   --   PHOS  --   --  4.2  --   --   --     Liver Function Tests: Recent Labs  Lab 10/16/2017 1429 10/27/17 0345  AST 245* 231*  ALT 97* 91*  ALKPHOS 97 69  BILITOT 1.6* 1.5*  PROT 9.4* 6.9  ALBUMIN 4.1 3.0*   No results for input(s): LIPASE, AMYLASE in the last 168 hours. Recent Labs  Lab 11/08/2017 1429  AMMONIA 17    CBC: Recent Labs  Lab 10/31/2017 1429 11/04/2017 1451 10/27/17 0816 10/28/17 0155 10/29/17 0316 10/30/17 0515  WBC 30.5*  --  23.4* 19.5* 13.6* 10.8*  NEUTROABS 26.2*  --  19.5* 16.3*  --   --   HGB 18.1* 18.0* 16.2* 13.1 11.9* 11.1*  HCT 48.4* 53.0* 44.8 36.3 33.5* 32.0*  MCV 94.2  --  95.5 94.5 95.4 97.6  PLT 266  --  174 150 126* 129*    Cardiac Enzymes: Recent Labs  Lab 10/31/2017 1429 10/27/17 0345 10/28/17 0155 10/29/17 0316 10/30/17 0515  CKTOTAL 9,306* 6,917* 4,627* 1,732* 724*    BNP: Invalid input(s): POCBNP  CBG: Recent Labs  Lab  10/25/2017 1435  GLUCAP 132*    Microbiology: Results for orders placed or performed during the hospital encounter of 10/21/2017  Blood Cultures (routine x 2)     Status: None (Preliminary result)   Collection Time: 10/14/2017  2:32 PM  Result Value Ref Range Status   Specimen Description   Final    BLOOD LEFT ANTECUBITAL Performed at [facility] [address], [place], [place] [address]    Special Requests   Final    BOTTLES DRAWN AEROBIC AND ANAEROBIC Blood Culture adequate volume Performed at [facility] [address], [place], [place] [address]    Culture   Final    NO GROWTH 3 DAYS Performed at [facility], [place] [address], [place], [place] [address]    Report Status PENDING  Incomplete  Gastrointestinal Panel by PCR , Stool     Status: None   Collection Time: 11/07/2017  7:00 PM  Result Value Ref Range Status   Campylobacter species  NOT DETECTED NOT DETECTED Final   Plesimonas shigelloides NOT DETECTED NOT DETECTED Final   Salmonella species NOT DETECTED NOT DETECTED Final   Yersinia enterocolitica NOT DETECTED NOT DETECTED Final   Vibrio species NOT DETECTED NOT DETECTED Final   Vibrio cholerae NOT DETECTED NOT DETECTED Final   Enteroaggregative E coli (EAEC) NOT DETECTED NOT DETECTED Final   Enteropathogenic E coli (EPEC) NOT DETECTED NOT DETECTED Final   Enterotoxigenic E coli (ETEC) NOT DETECTED NOT DETECTED Final   Shiga like toxin producing E coli (STEC) NOT DETECTED NOT DETECTED Final   Shigella/Enteroinvasive E coli (EIEC) NOT DETECTED NOT DETECTED Final   Cryptosporidium NOT DETECTED NOT DETECTED Final   Cyclospora cayetanensis NOT DETECTED NOT DETECTED Final   Entamoeba histolytica NOT DETECTED NOT DETECTED Final   Giardia lamblia NOT DETECTED NOT DETECTED Final   Adenovirus F40/41 NOT DETECTED NOT DETECTED Final   Astrovirus NOT DETECTED NOT DETECTED Final   Norovirus GI/GII NOT DETECTED NOT DETECTED Final   Rotavirus A NOT DETECTED NOT DETECTED Final   Sapovirus (I, II, IV, and V) NOT DETECTED NOT DETECTED Final    Comment: Performed at [facility], [place] [place], [place] [address]  C difficile quick scan w PCR reflex     Status: None   Collection Time: 10/25/2017  7:00 PM  Result Value Ref Range Status   C Diff antigen NEGATIVE NEGATIVE Final   C Diff toxin NEGATIVE NEGATIVE Final   C Diff interpretation No C. difficile detected.  Final    Comment: Performed at [facility], [place] [address] [place], [place] [address]  Blood Cultures (routine x 2)     Status: None (Preliminary result)   Collection Time: 10/27/17 12:50 AM  Result Value Ref Range Status   Specimen Description   Final    BLOOD RIGHT HAND Performed at [place] [address] [place], [place] [address]    Special Requests   Final    IN PEDIATRIC BOTTLE Blood Culture  adequate volume Performed at [facility] [address], [place], [place] [address]    Culture   Final    NO GROWTH 2 DAYS Performed at [facility] [address] [place], [place] [address]    Report Status PENDING  Incomplete  Urine culture     Status: None   Collection Time: 10/27/17  3:02 PM  Result Value Ref Range Status   Specimen  Description   Final    URINE, CATHETERIZED Performed at [facility], [place] [address], [place], [place] [address]    Special Requests   Final    NONE Performed at [facility], [place] [address] [place], [place] [address]    Culture   Final    NO GROWTH Performed at [facility], [place] [address] [place], [place] [address]    Report Status 10/28/2017 FINAL  Final  MRSA PCR Screening     Status: Abnormal   Collection Time: 10/28/17  9:27 AM  Result Value Ref Range Status   MRSA by PCR POSITIVE (A) NEGATIVE Final    Comment:        The GeneXpert MRSA Assay (FDA approved for NASAL specimens only), is one component of a comprehensive MRSA colonization surveillance program. It is not intended to diagnose MRSA infection nor to guide or monitor treatment for MRSA infections. RESULT CALLED TO, READ BACK BY AND VERIFIED WITH: GROGAN,B @ 9030 SP [id number] BY POTEAT,S Performed at [facility] [address], [place], [place] [address]     Coagulation Studies: No results for input(s): LABPROT, INR in the last 72 hours.  Urinalysis: Recent Labs    10/27/17 1502  COLORURINE AMBER*  LABSPEC 1.019  PHURINE 5.0  GLUCOSEU NEGATIVE  HGBUR LARGE*  BILIRUBINUR NEGATIVE  KETONESUR NEGATIVE  PROTEINUR 100*  NITRITE NEGATIVE  LEUKOCYTESUR NEGATIVE      Imaging: No results found.   Medications:   . heparin 1,300 Units/hr (10/30/17 0820)  . piperacillin-tazobactam (ZOSYN)  IV Stopped (10/30/17 3354)  .  sodium bicarbonate (isotonic) infusion in sterile  water 125 mL/hr at 10/30/17 0602   . Chlorhexidine Gluconate Cloth  6 each Topical Q0600  . dicyclomine  10 mg Oral TID AC & HS  . fentaNYL (SUBLIMAZE) injection  50 mcg Intravenous Once  . levalbuterol  0.63 mg Nebulization TID  . mupirocin ointment  1 application Nasal BID  . nicotine  14 mg Transdermal Daily  . potassium chloride  40 mEq Oral Once  . thiamine  100 mg Oral Daily   iopamidol, labetalol, levalbuterol, loperamide, LORazepam, morphine injection  Assessment/ Plan:   Acute renal failure  The etiology is a little puzzling but could be explained by some hypotension and ischemic ATN  CT that has showed no hydronephrosis. No evidence of AGN or AIN   She has enterocolitis on CT   Volume appears to be euvolemic at present and is being given IV fluids  She has a mild metabolic acidosis and is receiving IV bicarbonate at this time  Hypokalemia  Replete with IV fluids   Creatinine is improved this morning with improved urine output  Most likely ischemic bowel injury with negative work up for GI pathogens        LOS: 4 Charlcie Prisco W @TODAY @10 :55 AM

## 2017-10-30 NOTE — Progress Notes (Signed)
Patient heart rate 138 respirations 40 patient complaining of generalized pain. BP 174/81. Oxygen level 94% on room air Nasal cannula applied at 2 liters oxygen level up to 97 %.Patient denies chest pain. Dr. Denton Brick notified  new orders received

## 2017-10-31 ENCOUNTER — Inpatient Hospital Stay (HOSPITAL_COMMUNITY): Payer: Medicare Other

## 2017-10-31 ENCOUNTER — Inpatient Hospital Stay (HOSPITAL_COMMUNITY): Payer: Medicare Other | Admitting: Certified Registered Nurse Anesthetist

## 2017-10-31 DIAGNOSIS — I712 Thoracic aortic aneurysm, without rupture: Secondary | ICD-10-CM

## 2017-10-31 LAB — BLOOD GAS, ARTERIAL
ACID-BASE DEFICIT: 16.2 mmol/L — AB (ref 0.0–2.0)
Acid-base deficit: 4.1 mmol/L — ABNORMAL HIGH (ref 0.0–2.0)
Acid-base deficit: 9.2 mmol/L — ABNORMAL HIGH (ref 0.0–2.0)
BICARBONATE: 18.6 mmol/L — AB (ref 20.0–28.0)
BICARBONATE: 9.6 mmol/L — AB (ref 20.0–28.0)
Bicarbonate: 15 mmol/L — ABNORMAL LOW (ref 20.0–28.0)
DRAWN BY: 225631
DRAWN BY: 441261
Drawn by: 225631
FIO2: 100
FIO2: 60
LHR: 32 {breaths}/min
MECHVT: 500 mL
O2 CONTENT: 4 L/min
O2 SAT: 99.8 %
O2 SAT: 99.3 %
O2 Saturation: 98.8 %
PATIENT TEMPERATURE: 37
PCO2 ART: 25.8 mmHg — AB (ref 32.0–48.0)
PEEP/CPAP: 5 cmH2O
PEEP: 5 cmH2O
PH ART: 7.277 — AB (ref 7.350–7.450)
PO2 ART: 123 mmHg — AB (ref 83.0–108.0)
PO2 ART: 294 mmHg — AB (ref 83.0–108.0)
Patient temperature: 97.5
Patient temperature: 34.9
RATE: 16 resp/min
VT: 500 mL
pCO2 arterial: 26.1 mmHg — ABNORMAL LOW (ref 32.0–48.0)
pCO2 arterial: 20.5 mmHg — ABNORMAL LOW (ref 32.0–48.0)
pH, Arterial: 7.467 — ABNORMAL HIGH (ref 7.350–7.450)
pH, Arterial: 7.379 (ref 7.350–7.450)
pO2, Arterial: 226 mmHg — ABNORMAL HIGH (ref 83.0–108.0)

## 2017-10-31 LAB — CBC
HCT: 16.4 % — ABNORMAL LOW (ref 36.0–46.0)
HCT: 21.9 % — ABNORMAL LOW (ref 36.0–46.0)
HEMATOCRIT: 26.1 % — AB (ref 36.0–46.0)
HEMATOCRIT: 21 % — AB (ref 36.0–46.0)
HEMOGLOBIN: 8.2 g/dL — AB (ref 12.0–15.0)
HEMOGLOBIN: 6.6 g/dL — AB (ref 12.0–15.0)
Hemoglobin: 5.4 g/dL — CL (ref 12.0–15.0)
Hemoglobin: 7.4 g/dL — ABNORMAL LOW (ref 12.0–15.0)
MCH: 32.5 pg (ref 26.0–34.0)
MCH: 32.9 pg (ref 26.0–34.0)
MCH: 31.7 pg (ref 26.0–34.0)
MCH: 32.2 pg (ref 26.0–34.0)
MCHC: 32.3 g/dL (ref 30.0–36.0)
MCHC: 33.8 g/dL (ref 30.0–36.0)
MCHC: 31.4 g/dL (ref 30.0–36.0)
MCHC: 31.4 g/dL (ref 30.0–36.0)
MCV: 100.6 fL — AB (ref 78.0–100.0)
MCV: 97.3 fL (ref 78.0–100.0)
MCV: 100.8 fL — ABNORMAL HIGH (ref 78.0–100.0)
MCV: 102.4 fL — AB (ref 78.0–100.0)
PLATELETS: 84 10*3/uL — AB (ref 150–400)
Platelets: 174 10*3/uL (ref 150–400)
Platelets: 75 10*3/uL — ABNORMAL LOW (ref 150–400)
Platelets: 66 10*3/uL — ABNORMAL LOW (ref 150–400)
RBC: 1.63 MIL/uL — AB (ref 3.87–5.11)
RBC: 2.25 MIL/uL — AB (ref 3.87–5.11)
RBC: 2.59 MIL/uL — AB (ref 3.87–5.11)
RBC: 2.05 MIL/uL — AB (ref 3.87–5.11)
RDW: 13.5 % (ref 11.5–15.5)
RDW: 15.4 % (ref 11.5–15.5)
RDW: 16.4 % — ABNORMAL HIGH (ref 11.5–15.5)
RDW: 17 % — ABNORMAL HIGH (ref 11.5–15.5)
WBC: 23.1 10*3/uL — AB (ref 4.0–10.5)
WBC: 41.3 10*3/uL — AB (ref 4.0–10.5)
WBC: 36.9 10*3/uL — AB (ref 4.0–10.5)
WBC: 34.4 10*3/uL — ABNORMAL HIGH (ref 4.0–10.5)

## 2017-10-31 LAB — COMPREHENSIVE METABOLIC PANEL
ALBUMIN: 1.5 g/dL — AB (ref 3.5–5.0)
ALT: 511 U/L — AB (ref 14–54)
AST: 1221 U/L — ABNORMAL HIGH (ref 15–41)
Alkaline Phosphatase: 64 U/L (ref 38–126)
Anion gap: 33 — ABNORMAL HIGH (ref 5–15)
BILIRUBIN TOTAL: 1.8 mg/dL — AB (ref 0.3–1.2)
BUN: 37 mg/dL — AB (ref 6–20)
CHLORIDE: 94 mmol/L — AB (ref 101–111)
CO2: 13 mmol/L — ABNORMAL LOW (ref 22–32)
CREATININE: 3.39 mg/dL — AB (ref 0.44–1.00)
Calcium: 5.6 mg/dL — CL (ref 8.9–10.3)
GFR calc Af Amer: 16 mL/min — ABNORMAL LOW (ref >60)
GFR, EST NON AFRICAN AMERICAN: 14 mL/min — AB (ref >60)
GLUCOSE: 139 mg/dL — AB (ref 65–99)
Potassium: 6.2 mmol/L — ABNORMAL HIGH (ref 3.5–5.1)
Sodium: 140 mmol/L (ref 135–145)
TOTAL PROTEIN: 3.3 g/dL — AB (ref 6.5–8.1)

## 2017-10-31 LAB — GLUCOSE, CAPILLARY
GLUCOSE-CAPILLARY: 160 mg/dL — AB (ref 65–99)
GLUCOSE-CAPILLARY: 88 mg/dL (ref 65–99)
Glucose-Capillary: 37 mg/dL — CL (ref 65–99)
Glucose-Capillary: 152 mg/dL — ABNORMAL HIGH (ref 65–99)
Glucose-Capillary: 204 mg/dL — ABNORMAL HIGH (ref 65–99)
Glucose-Capillary: 235 mg/dL — ABNORMAL HIGH (ref 65–99)

## 2017-10-31 LAB — COOXEMETRY PANEL
Carboxyhemoglobin: 0.8 % (ref 0.5–1.5)
METHEMOGLOBIN: 1 % (ref 0.0–1.5)
O2 Saturation: 99.3 %
TOTAL HEMOGLOBIN: 7.8 g/dL — AB (ref 12.0–16.0)

## 2017-10-31 LAB — PREPARE RBC (CROSSMATCH)

## 2017-10-31 LAB — CK: Total CK: 453 U/L — ABNORMAL HIGH (ref 38–234)

## 2017-10-31 LAB — BASIC METABOLIC PANEL
ANION GAP: 29 — AB (ref 5–15)
BUN: 40 mg/dL — ABNORMAL HIGH (ref 6–20)
CALCIUM: 6.7 mg/dL — AB (ref 8.9–10.3)
CO2: 14 mmol/L — ABNORMAL LOW (ref 22–32)
Chloride: 96 mmol/L — ABNORMAL LOW (ref 101–111)
Creatinine, Ser: 3.6 mg/dL — ABNORMAL HIGH (ref 0.44–1.00)
GFR, EST AFRICAN AMERICAN: 15 mL/min — AB (ref >60)
GFR, EST NON AFRICAN AMERICAN: 13 mL/min — AB (ref >60)
GLUCOSE: 64 mg/dL — AB (ref 65–99)
POTASSIUM: 5.5 mmol/L — AB (ref 3.5–5.1)
Sodium: 139 mmol/L (ref 135–145)

## 2017-10-31 LAB — AMMONIA: Ammonia: 25 umol/L (ref 9–35)

## 2017-10-31 LAB — TROPONIN I
TROPONIN I: 0.18 ng/mL — AB (ref <0.03)
TROPONIN I: 10.07 ng/mL — AB (ref <0.03)
Troponin I: 0.06 ng/mL (ref <0.03)
Troponin I: 2.37 ng/mL (ref <0.03)

## 2017-10-31 LAB — ECHOCARDIOGRAM COMPLETE
Height: 63 in
Weight: 2913.6 oz

## 2017-10-31 LAB — MAGNESIUM: MAGNESIUM: 1 mg/dL — AB (ref 1.7–2.4)

## 2017-10-31 LAB — PROTIME-INR
INR: 2.25
Prothrombin Time: 24.6 seconds — ABNORMAL HIGH (ref 11.4–15.2)

## 2017-10-31 LAB — HEPARIN LEVEL (UNFRACTIONATED): HEPARIN UNFRACTIONATED: 0.99 [IU]/mL — AB (ref 0.30–0.70)

## 2017-10-31 LAB — CULTURE, BLOOD (ROUTINE X 2)
CULTURE: NO GROWTH
SPECIAL REQUESTS: ADEQUATE

## 2017-10-31 LAB — PROCALCITONIN: Procalcitonin: 0.87 ng/mL

## 2017-10-31 LAB — LACTIC ACID, PLASMA
LACTIC ACID, VENOUS: 15.4 mmol/L — AB (ref 0.5–1.9)
LACTIC ACID, VENOUS: 21.6 mmol/L — AB (ref 0.5–1.9)

## 2017-10-31 LAB — ABO/RH: ABO/RH(D): O POS

## 2017-10-31 MED ORDER — ORAL CARE MOUTH RINSE
15.0000 mL | Freq: Four times a day (QID) | OROMUCOSAL | Status: DC
  Administered 2017-11-01 (×2): 15 mL via OROMUCOSAL

## 2017-10-31 MED ORDER — SODIUM CHLORIDE 0.9 % IV SOLN
0.0000 ug/h | INTRAVENOUS | Status: DC
  Filled 2017-10-31: qty 50

## 2017-10-31 MED ORDER — SODIUM BICARBONATE 8.4 % IV SOLN
INTRAVENOUS | Status: DC
  Administered 2017-10-31 – 2017-11-01 (×4): via INTRAVENOUS
  Filled 2017-10-31 (×3): qty 150

## 2017-10-31 MED ORDER — PHENYLEPHRINE HCL-NACL 10-0.9 MG/250ML-% IV SOLN
INTRAVENOUS | Status: AC
  Administered 2017-10-31: 20 ug/min via INTRAVENOUS
  Filled 2017-10-31: qty 250

## 2017-10-31 MED ORDER — SODIUM CHLORIDE 0.9 % IV SOLN
8.0000 mg/h | INTRAVENOUS | Status: DC
  Administered 2017-10-31 (×2): 8 mg/h via INTRAVENOUS
  Filled 2017-10-31 (×5): qty 80

## 2017-10-31 MED ORDER — SODIUM BICARBONATE 8.4 % IV SOLN
INTRAVENOUS | Status: AC
  Filled 2017-10-31: qty 50

## 2017-10-31 MED ORDER — SODIUM BICARBONATE 8.4 % IV SOLN
100.0000 meq | Freq: Once | INTRAVENOUS | Status: AC
  Administered 2017-10-31: 100 meq via INTRAVENOUS

## 2017-10-31 MED ORDER — PHENYLEPHRINE HCL-NACL 10-0.9 MG/250ML-% IV SOLN
0.0000 ug/min | INTRAVENOUS | Status: DC
  Administered 2017-10-31 (×2): 400 ug/min via INTRAVENOUS
  Administered 2017-10-31 (×2): 20 ug/min via INTRAVENOUS
  Administered 2017-10-31: 400 ug/min via INTRAVENOUS
  Filled 2017-10-31 (×3): qty 250
  Filled 2017-10-31 (×2): qty 500
  Filled 2017-10-31: qty 250

## 2017-10-31 MED ORDER — CHLORHEXIDINE GLUCONATE CLOTH 2 % EX PADS: 6.0000 | MEDICATED_PAD | Freq: Every day | CUTANEOUS | Status: DC

## 2017-10-31 MED ORDER — SODIUM CHLORIDE 0.9 % IV SOLN
Freq: Once | INTRAVENOUS | Status: AC
  Administered 2017-10-31: 14:00:00 via INTRAVENOUS

## 2017-10-31 MED ORDER — SODIUM BICARBONATE 8.4 % IV SOLN
50.0000 meq | Freq: Once | INTRAVENOUS | Status: AC
  Administered 2017-10-31: 50 meq via INTRAVENOUS

## 2017-10-31 MED ORDER — FENTANYL CITRATE (PF) 100 MCG/2ML IJ SOLN: 50.0000 ug | Freq: Once | INTRAMUSCULAR | Status: DC

## 2017-10-31 MED ORDER — THIAMINE HCL 100 MG/ML IJ SOLN
100.0000 mg | Freq: Every day | INTRAMUSCULAR | Status: DC
  Administered 2017-10-31: 100 mg via INTRAVENOUS
  Filled 2017-10-31: qty 2

## 2017-10-31 MED ORDER — SODIUM CHLORIDE 0.9 % IV BOLUS (SEPSIS)
1000.0000 mL | Freq: Once | INTRAVENOUS | Status: AC
  Administered 2017-10-31: 1000 mL via INTRAVENOUS

## 2017-10-31 MED ORDER — MAGNESIUM SULFATE 2 GM/50ML IV SOLN
2.0000 g | Freq: Once | INTRAVENOUS | Status: AC
  Administered 2017-10-31: 2 g via INTRAVENOUS
  Filled 2017-10-31: qty 50

## 2017-10-31 MED ORDER — SODIUM CHLORIDE 0.9 % IV SOLN
1.0000 g | Freq: Once | INTRAVENOUS | Status: AC
  Administered 2017-10-31: 1 g via INTRAVENOUS
  Filled 2017-10-31: qty 10

## 2017-10-31 MED ORDER — NOREPINEPHRINE BITARTRATE 1 MG/ML IV SOLN
0.0000 ug/min | INTRAVENOUS | Status: DC
  Administered 2017-10-31 (×2): 40 ug/min via INTRAVENOUS
  Administered 2017-10-31: 30 ug/min via INTRAVENOUS
  Administered 2017-10-31: 40 ug/min via INTRAVENOUS
  Filled 2017-10-31 (×6): qty 4

## 2017-10-31 MED ORDER — DEXTROSE 50 % IV SOLN
50.0000 mL | Freq: Once | INTRAVENOUS | Status: AC
  Administered 2017-10-31: 50 mL via INTRAVENOUS

## 2017-10-31 MED ORDER — SODIUM BICARBONATE 8.4 % IV SOLN
INTRAVENOUS | Status: AC
  Administered 2017-10-31: 50 meq
  Filled 2017-10-31: qty 50

## 2017-10-31 MED ORDER — VASOPRESSIN 20 UNIT/ML IV SOLN
0.0300 [IU]/min | INTRAVENOUS | Status: DC
  Administered 2017-10-31 – 2017-11-01 (×2): 0.03 [IU]/min via INTRAVENOUS
  Filled 2017-10-31 (×3): qty 2

## 2017-10-31 MED ORDER — HYDROCORTISONE NA SUCCINATE PF 100 MG IJ SOLR
50.0000 mg | Freq: Four times a day (QID) | INTRAMUSCULAR | Status: DC
  Administered 2017-10-31 – 2017-11-01 (×4): 50 mg via INTRAVENOUS
  Filled 2017-10-31 (×4): qty 2

## 2017-10-31 MED ORDER — SODIUM CHLORIDE 0.9 % IV SOLN: Freq: Once | INTRAVENOUS | Status: DC

## 2017-10-31 MED ORDER — FENTANYL CITRATE (PF) 100 MCG/2ML IJ SOLN: 100.0000 ug | INTRAMUSCULAR | Status: DC | PRN

## 2017-10-31 MED ORDER — PANTOPRAZOLE SODIUM 40 MG IV SOLR: 40.0000 mg | Freq: Two times a day (BID) | INTRAVENOUS | Status: DC

## 2017-10-31 MED ORDER — PROTAMINE SULFATE 10 MG/ML IV SOLN
8.0000 mg | Freq: Once | INTRAVENOUS | Status: AC
  Administered 2017-10-31: 8 mg via INTRAVENOUS
  Filled 2017-10-31: qty 0.8

## 2017-10-31 MED ORDER — SODIUM CHLORIDE 0.9% FLUSH: 10.0000 mL | INTRAVENOUS | Status: DC | PRN

## 2017-10-31 MED ORDER — DEXTROSE 50 % IV SOLN
INTRAVENOUS | Status: AC
  Filled 2017-10-31: qty 50

## 2017-10-31 MED ORDER — SODIUM CHLORIDE 0.9 % IV SOLN
Freq: Once | INTRAVENOUS | Status: AC
  Administered 2017-10-31: 10:00:00 via INTRAVENOUS

## 2017-10-31 MED ORDER — SODIUM CHLORIDE 0.9 % IV SOLN
Freq: Once | INTRAVENOUS | Status: AC
  Administered 2017-11-01: via INTRAVENOUS

## 2017-10-31 MED ORDER — SODIUM CHLORIDE 0.9 % IV SOLN
0.0000 ug/min | INTRAVENOUS | Status: DC
  Administered 2017-10-31 (×2): 400 ug/min via INTRAVENOUS
  Administered 2017-10-31: 395 ug/min via INTRAVENOUS
  Administered 2017-10-31 – 2017-11-01 (×9): 400 ug/min via INTRAVENOUS
  Filled 2017-10-31 (×4): qty 40
  Filled 2017-10-31: qty 4
  Filled 2017-10-31 (×4): qty 40
  Filled 2017-10-31: qty 4
  Filled 2017-10-31: qty 40
  Filled 2017-10-31 (×2): qty 4

## 2017-10-31 MED ORDER — NOREPINEPHRINE BITARTRATE 1 MG/ML IV SOLN
0.0000 ug/min | INTRAVENOUS | Status: DC
  Administered 2017-10-31 – 2017-11-01 (×2): 40 ug/min via INTRAVENOUS
  Filled 2017-10-31 (×3): qty 16

## 2017-10-31 MED ORDER — ROCURONIUM BROMIDE 50 MG/5ML IV SOLN
80.0000 mg | Freq: Once | INTRAVENOUS | Status: AC
  Administered 2017-10-31: 80 mg via INTRAVENOUS
  Filled 2017-10-31: qty 8

## 2017-10-31 MED ORDER — DEXTROSE 50 % IV SOLN: 25.0000 mL | Freq: Once | INTRAVENOUS | Status: DC

## 2017-10-31 MED ORDER — DEXTROSE 5 % IV SOLN
0.5000 ug/min | INTRAVENOUS | Status: DC
  Administered 2017-10-31: 20 ug/min via INTRAVENOUS
  Administered 2017-10-31: 0.5 ug/min via INTRAVENOUS
  Administered 2017-10-31 – 2017-11-01 (×3): 20 ug/min via INTRAVENOUS
  Filled 2017-10-31 (×6): qty 4

## 2017-10-31 MED ORDER — CHLORHEXIDINE GLUCONATE 0.12% ORAL RINSE (MEDLINE KIT)
15.0000 mL | Freq: Two times a day (BID) | OROMUCOSAL | Status: DC
  Administered 2017-10-31: 15 mL via OROMUCOSAL

## 2017-10-31 MED ORDER — PANTOPRAZOLE SODIUM 40 MG IV SOLR
40.0000 mg | INTRAVENOUS | Status: DC
Start: 2017-10-31 — End: 2017-10-31
  Administered 2017-10-31: 40 mg via INTRAVENOUS
  Filled 2017-10-31: qty 40

## 2017-10-31 MED FILL — Medication: Qty: 1 | Status: AC

## 2017-10-31 NOTE — Procedures (Signed)
Central Venous Catheter Insertion Procedure Note Brooke Garcia [id number] [birth year]  Procedure: Insertion of Central Venous Catheter Indications: Assessment of intravascular volume, Drug and/or fluid administration and Frequent blood sampling  Procedure Details Consent: Unable to obtain consent because of emergent medical necessity. Time Out: Verified patient identification, verified procedure, site/side was marked, verified correct patient position, special equipment/implants available, medications/allergies/relevent history reviewed, required imaging and test results available.  Performed  Maximum sterile technique was used including antiseptics, cap, gloves, gown, hand hygiene, mask and sheet. Skin prep: Chlorhexidine; local anesthetic administered A antimicrobial bonded/coated triple lumen catheter was placed in the left internal jugular vein using the Seldinger technique.  Evaluation Blood flow good Complications: No apparent complications Patient did tolerate procedure well. Chest X-ray ordered to verify placement.  CXR: pending.  Brooke Garcia 10/31/2017, 5:16 AM

## 2017-10-31 NOTE — Progress Notes (Signed)
[facility] Progress Note   Chief Complaint:   Diarrhea, abnormal CT scan    SUBJECTIVE:   No significant diarrhea today. Patient intubated on vent since last night    ASSESSMENT AND PLAN:   61. 62 yo female with multiple medical problems found at home, semi-conscious. Admitted with altered mental status, diarrhea, lactic acidosis and ARF and leukocytosis.CT scan >> pan entercolitis.  Stool for c-diff is negative.   WBC is markedly elevated at 23. Repeat CT scan today reveals large retroperitoneal hematoma, continued distention of large and small bowel (prox colonic distention has progress and at 9 cm). Rectal tube in place and distal colon less distended.  -She is NOT having significant output in rectal tube. Infectious etiology of diarrhea / bowel findings on CT scan not excluded but suspecting ischemic etiology.  -Her condition has deteriorated with multi-organ failure. She has worsening AKI / acidosis/ shock on maximum pressor support. There is mild cerebral edema on head CT scan. Her liver labs now suggestive of ischemic hepatitis. PCCM had long talk with family who want full medical support but no CPR.   2. Large retroperitoneal hematoma. Transfused and hgb up from 5.4 to 7.4   OBJECTIVE:     Vital signs in last 24 hours: Temp:  [88.2 F (31.2 C)-100.3 F (37.9 C)] 94.8 F (34.9 C) (02/18 1010) Pulse Rate:  [70-161] 110 (02/18 1010) Resp:  [14-51] 35 (02/18 0942) BP: (35-174)/(23-101) 120/101 (02/18 0900) SpO2:  [73 %-100 %] 73 % (02/18 1010) Arterial Line BP: (48-156)/(32-76) 93/62 (02/18 1010) FiO2 (%):  [60 %-100 %] 60 % (02/18 1010) Last BM Date: 10/30/17 General:   Obese female intubated on vent. Sister, patient's son and ex-husband in room Heart:  occas irregular beat, slightly tachycardic Pulm: Normal respiratory effort, lungs CTA bilaterally without wheezes or crackles. Abdomen:  Soft, distended, hypoactive BS Rectal: rectal tube in place with  ~ 200 ml dark liquid.    Neurologic:  Alert and  oriented x4;  grossly normal neurologically. Psych:  Pleasant, cooperative.  Normal mood and affect.   Intake/Output from previous day: 02/17 0701 - 02/18 0700 In: 5288.5 [P.O.:125; I.V.:5063.5; IV Piggyback:100] Out: 975 [Urine:975] Intake/Output this shift: Total I/O In: 1022.4 [I.V.:150; Blood:872.4] Out: -   Lab Results: Recent Labs    10/30/17 0515 10/30/17 1814 10/31/17 0426  WBC 10.8* 15.8* 23.1*  HGB 11.1* 9.9* 5.4*  HCT 32.0* 28.8* 16.4*  PLT 129* 201 174   BMET Recent Labs    10/30/17 0515 10/30/17 1814 10/31/17 0426  NA 138 137 139  K 3.4* 3.5 5.5*  CL 94* 92* 96*  CO2 32 31 14*  GLUCOSE 99 139* 64*  BUN 48* 42* 40*  CREATININE 3.22* 2.95* 3.60*  CALCIUM 7.4* 7.4* 6.7*   LFT No results for input(s): PROT, ALBUMIN, AST, ALT, ALKPHOS, BILITOT, BILIDIR, IBILI in the last 72 hours. PT/INR Recent Labs    10/31/17 0530  LABPROT 24.6*  INR 2.25    Ct Abdomen Pelvis Wo Contrast  Result Date: 10/31/2017 CLINICAL DATA:  Uncontrolled pain.  Abdominal distention. EXAM: CT ABDOMEN AND PELVIS WITHOUT CONTRAST TECHNIQUE: Multidetector CT imaging of the abdomen and pelvis was performed following the standard protocol without IV contrast. COMPARISON:  Four days prior FINDINGS: Lower chest: Small left pleural effusion. Mild lower lobe atelectasis. Coronary atherosclerotic calcification. Hepatobiliary: Hepatic steatosis.Cholelithiasis. No evidence of acute cholecystitis. Pancreas: Unremarkable. Spleen: Unremarkable. Adrenals/Urinary Tract: Negative adrenals. No hydronephrosis or stone. The urinary bladder is decompressed  by Foley catheter. Stomach/Bowel: Distended colon with gas and fluid levels. The proximal colon measures up to 8 cm. Rectal tube in place with less distention of the distal colon. On scanogram there is small bowel distention by gas as well, without transition point. No detected wall thickening or  pneumatosis. Nasogastric tube in place. The appendix is not discretely seen. No pericecal inflammation. Vascular/Lymphatic: Flat IVC. Diffuse atherosclerotic calcification. No mass or adenopathy. Reproductive:Hysterectomy. Other: There is a large retroperitoneal hematoma extending from the left diaphragm into the pelvis. Variable density collection measures up to 10 cm in thickness. Transfusion is pending per chart review. Dependent soft tissue edema. Musculoskeletal: No acute finding. Congenitally narrow spinal canal with superimposed posterior element hypertrophy causing diffuse lumbar spinal stenosis. Critical Value/emergent results were called by telephone at the time of interpretation on 10/31/2017 at 6:46 am to Dr. Zacarias Pontes , who verbally acknowledged these results. IMPRESSION: 1. Large left retroperitoneal hematoma extending from diaphragm to pelvis and measuring up to 10 cm in thickness. 2. Continue distension of small and large bowel which may reflect ileus/enterocolitis. Proximal colon distention has progressed from 4 days prior, now 9 cm. 3. Collapsed IVC as with intravascular depletion. 4. Hepatic steatosis. 5. Cholelithiasis. Electronically Signed   By: Monte Fantasia M.D.   On: 10/31/2017 06:50   Ct Head Wo Contrast  Result Date: 10/31/2017 CLINICAL DATA:  Seizure, new, nontraumatic, >40 yrs Focal neuro deficit, < 6 hrs, stroke suspected Altered level of consciousness (LOC), unexplained. Post code. EXAM: CT HEAD WITHOUT CONTRAST TECHNIQUE: Contiguous axial images were obtained from the base of the skull through the vertex without intravenous contrast. COMPARISON:  Head CT 10/23/2017 FINDINGS: Brain: Mild sulcal effacement and loss of differentiation of gray-white in the high frontal parietal lobes. Smaller ventricles on prior exam. No tonsillar herniation. No evidence of acute ischemia or territorial infarct. The basilar cisterns are patent. No hemorrhage or subdural collection. Vascular: No  hyperdense vessel. Skull: No fracture or focal lesion. Sinuses/Orbits: Minimal debris in right ethmoid air cells. Mucosal thickening of the maxillary sinuses. Other: None. IMPRESSION: Findings consistent with mild cerebral edema. No hemorrhage or acute ischemia. These results were called by telephone at the time of interpretation on 10/31/2017 at 6:42 am to Dr. Seward Carol , who verbally acknowledged these results. Electronically Signed   By: Jeb Levering M.D.   On: 10/31/2017 06:40   Dg Chest Port 1 View  Result Date: 10/31/2017 CLINICAL DATA:  Endotracheal intubation EXAM: PORTABLE CHEST 1 VIEW COMPARISON:  Chest radiograph 10/31/2017 FINDINGS: Endotracheal tube has been retracted and the tip is now 3.2 cm above the inferior carina. Left IJ approach central venous catheter tip is at the cavoatrial junction. Orogastric tube tip and side-port in the stomach. Otherwise unchanged appearance of the chest. IMPRESSION: Endotracheal tube tip now 3.2 cm above the inferior carina. Left internal jugular vein approach central venous catheter tip at the cavoatrial junction. Electronically Signed   By: Ulyses Jarred M.D.   On: 10/31/2017 05:58   Dg Chest Port 1 View  Result Date: 10/31/2017 CLINICAL DATA:  Endotracheal intubation EXAM: PORTABLE CHEST 1 VIEW COMPARISON:  Chest radiograph 10/30/2017 FINDINGS: Endotracheal tube tip is 1.5 cm above the inferior margin of the carina. Retraction of 3.5 cm would place the tip at the level of the clavicular heads. The nasogastric tube courses beyond the diaphragm. There is left basilar atelectasis but the lungs are otherwise clear. Normal cardiomediastinal contours. IMPRESSION: Endotracheal tube tip 1.5 cm above the inferior carina.  Retraction by 3.5 cm recommended. Electronically Signed   By: Ulyses Jarred M.D.   On: 10/31/2017 03:50    Principal Problem:   Acute kidney injury superimposed on CKD ([place]) Active Problems:   TOBACCO ABUSE   HYPERTENSION, BENIGN  ESSENTIAL   DVT of lower extremity, bilateral (HCC)   COPD, moderate (HCC)   Acute renal failure (HCC)   Leucocytosis   Chronic hepatitis C without hepatic coma (HCC)   Chronic renal disease, stage 3, moderately decreased glomerular filtration rate (GFR) between 30-59 mL/min/1.73 square meter (HCC)   Loose stools   Severe sepsis (HCC)   Rhabdomyolysis   Hypokalemia   Altered mental status   Cystocele with prolapse   Elevated liver enzymes   Diarrhea   Abnormal CT scan, colon   Enterocolitis     LOS: 5 days   Tye Savoy ,NP 10/31/2017, 10:29 AM  Pager number [phone]  I have discussed the case with the PA, and that is the plan I formulated. I personally interviewed and examined the patient.  CC: diarrhea  Patient cannot give Hx or ROS on ventilator and sedation.  No reported diarrhea any more.  She has taken a serious turn for the worse, and is critically ill with sepsis and hemodynamic instability.  It seems increasingly likely that she may not survive.  The original clinical question for which we were called does not appear to be ongoing at this point.  Therefore, we will sign off and he may call us if the need arises.    Nelida Meuse III Pager [phone]  Mon-Fri 8a-5p [phone] after 5p, weekends, holidays

## 2017-10-31 NOTE — Progress Notes (Signed)
Pt. Transported to/from CT uneventfully.

## 2017-10-31 NOTE — Progress Notes (Signed)
[facility] Progress Note Patient Name: Brooke Garcia DOB: Nov 27, 1955 MRN: [id number]   Date of Service  10/31/2017  HPI/Events of Note  Patient with increasing agitation with RR = 40's. Given Ativan and Fentanyl IV. Subsequently became agonal and hypoxic. Patient bagged and intubated emergently intubated by anesthesia. BP = 91/44 with MAP = 55 post intubation. Patient is already on a Heparin IV infusion.   eICU Interventions  Will order: 1. Portable CXR STAT. 2. Ventilator settings: 100%/PRVC 16/TV 500/P 5.  3. ABG at 4 AM. 4. Fentanyl IV infusion. Titrate to RASS = -0 to -1.  5. Protonix IV 6. Place OGT to LIS.      Intervention Category Major Interventions: Respiratory failure - evaluation and management  Augusta Hilbert Eugene 10/31/2017, 3:09 AM

## 2017-10-31 NOTE — Progress Notes (Addendum)
[facility] Progress Note Patient Name: Brooke Garcia DOB: 13-Mar-1956 MRN: [id number]   Date of Service  10/31/2017  HPI/Events of Note  Hypotension - BP = 36/21. No CVL or CVP.  eICU Interventions  Will order: 1. Phenylephrine IV infusion. Titrate to MAP >= 65. 2. Respiratory Therapy to place A-line.      Intervention Category Major Interventions: Hypotension - evaluation and management  Brooke Garcia 10/31/2017, 3:26 AM

## 2017-10-31 NOTE — Progress Notes (Signed)
Progress note on events prior to intubation.  Patient was continuing to have abdominal pain after waking up one hour after fentanyl given. Patient remained alert and oriented  until moments before respiratory arrest.  We had a nurse and respiratory therapist in room awaiting transport to get CT of abdomen.  At that point she respiratory arrested.

## 2017-10-31 NOTE — Progress Notes (Signed)
[facility] Progress Note Patient Name: Brooke Garcia DOB: May 31, 1956 MRN: [id number]   Date of Service  10/31/2017  HPI/Events of Note  Anemia - Hgb = 6.6.   eICU Interventions  Will transfuse 1 unit PRBC.     Intervention Category Major Interventions: Other:  Lysle Dingwall 10/31/2017, 11:43 PM

## 2017-10-31 NOTE — Progress Notes (Signed)
   10/31/17 1100  Clinical Encounter Type  Visited With Family  Visit Type Initial;Psychological support;Spiritual support;Critical Care  Referral From Nurse  Consult/Referral To Chaplain  Spiritual Encounters  Spiritual Needs Emotional;Grief support;Other (Comment) (Spiritual Care Conversation/Support)  Stress Factors  Patient Stress Factors Not reviewed  Family Stress Factors Health changes;Loss;Major life changes  Advance Directives (For Healthcare)  Does Patient Have a Medical Advance Directive? No  Would patient like information on creating a medical advance directive? No - Patient declined  [place]  Does Patient Have a Mental Health Advance Directive? No  Would patient like information on creating a mental health advance directive? No - Patient declined   I visited with the family of the patient due to the patient's decline in health. The patient's husband, son and his son's significant other were present in the room.  The patient's family were tearful and in shock over the patient's declining condition.  The patient's husband stated that he is still grieving the death of his mother back in 10/09/23. He stated that he and the patient have been together for nearly 40 years.  The family has good support. We prayed at the bedside. I provided emotional and grief support fort he family.   Please, contact Spiritual Care for further assistance.   Chaplain Shanon Ace M.Div., [facility]

## 2017-10-31 NOTE — Progress Notes (Signed)
RN requested for pharmacy to concentrated Levophed infusion bag due to high rate of infusion.  Pharmacy changed order for Levophed 4 mg in 250 mL D5W (single strength) to Levophed 16 mg in 250 mL D5W (quadruple strength).  Pharmacy hand delivered quad strength Levophed to patient's RN and communicated need to adjust infusion pump settings for quad strength Levophed.  RN verbalized understanding.  Peggyann Juba, PharmD, BCPS Pharmacy: [phone] 10/31/2017 2:27 PM

## 2017-10-31 NOTE — Progress Notes (Signed)
  Echocardiogram 2D Echocardiogram has been performed.  Brooke Garcia 10/31/2017, 11:23 AM

## 2017-10-31 NOTE — Progress Notes (Signed)
[facility]  Came back to bedside due to worsening shock, acidosis. Her blood pressure responds to sodium bicarbonate. Hgb now up over 7gm/dL after blood product administration LFT's show shock liver Lactic acid now even higher, > 20 On maximum dose of epinephrine, vasopressin, norepinephrine, phenylephrine   On exam Bilateral breath sounds, synchronous with the vent Belly distended hard, no bowel sounds Mottling noted in extremities  CXR   Echo: LVEF normal  Impression:   Worsening shock: continue max dose four vasopressors  Multi-organ failure: worsening AKI, now with shock liver  Acidosis: I believe she has ischemic bowel causing worsening acidosis, there was clinical suspicion for this after admission and now with worsening acidosis despite clear evidence of an infection, cardiac problem I think her shock is due primarily to worsening lactic acidosis from bowel death  Retroperitoneal hematoma: stabilized Hgb, hold off on further transfusion after this last PRBC transfusion  I updated multiple family members in the room at length.  I told them that I believe she will not survive this illness.  She is too sick to consider emergent abdominal surgery or hemodialysis.  I advised that performing CPR would likely cause more harm than good.  Children Elenore Rota and Sharyn Lull are discussing code status now.  Additional CC time 35 minutes  Roselie Awkward, MD [facility] Pager: [phone] Cell: [phone] After 3pm or if no response, call [phone]

## 2017-10-31 NOTE — Anesthesia Procedure Notes (Addendum)
Procedure Name: Intubation Date/Time: 10/31/2017 2:06 AM Performed by: Claudia Desanctis, CRNA Pre-anesthesia Checklist: Patient identified, Emergency Drugs available, Suction available and Patient being monitored Patient Re-evaluated:Patient Re-evaluated prior to induction Oxygen Delivery Method: Ambu bag Preoxygenation: Pre-oxygenation with 100% oxygen Ventilation: Mask ventilation without difficulty Laryngoscope Size: Miller and 3 Grade View: Grade I Tube type: Oral Tube size: 7.5 mm Number of attempts: 1 Airway Equipment and Method: Stylet Placement Confirmation: ETT inserted through vocal cords under direct vision,  positive ETCO2 and breath sounds checked- equal and bilateral Secured at: 23 cm Tube secured with: Tape Dental Injury: Teeth and Oropharynx as per pre-operative assessment

## 2017-10-31 NOTE — Progress Notes (Signed)
CRITICAL VALUE ALERT  Critical Value: Ca- 5.6, Trop. 2.37, Lactic Acid- 21.6   Date & Time Notied: 10/31/17, 1313  Provider Notified: Dr. Lake Bells   Orders Received/Actions taken:  1 amp Calcium Gluconate

## 2017-10-31 NOTE — Progress Notes (Signed)
[facility] Progress Note Patient Name: Brooke Garcia DOB: [birth year] MRN: [id number]   Date of Service  10/31/2017  HPI/Events of Note  ABG on 100%/PRVC 16/TV 500/P 5 = 7.379/25/294/15  eICU Interventions  Continue present ventilator management.      Intervention Category Major Interventions: Respiratory failure - evaluation and management  [name] 10/31/2017, 5:05 AM

## 2017-10-31 NOTE — Progress Notes (Signed)
Initial Nutrition Assessment  DOCUMENTATION CODES:   Obesity unspecified  INTERVENTION:   Monitor for clinical outcome  NUTRITION DIAGNOSIS:   Inadequate oral intake related to inability to eat as evidenced by NPO status.  GOAL:   Provide needs based on ASPEN/SCCM guidelines  MONITOR:   Vent status, Labs, Weight trends, I & O's  REASON FOR ASSESSMENT:   Ventilator    ASSESSMENT:   Pt with PMH significant for HTN, COPD, CKD, Hep C, and thoracic abdominal aneurysm. Presents this admission with altered mental status. Admitted for severe sepsis. CT scan 2/18 revealed large retroperitoneal bleed.    2/18- called code blue, pt found to be bradycardic and apneic, pt intubated and transferred to ICU  Acidosis worsening likely related to ischemic bowel. Pt is on max pressors. No able to speak with family. Family had goals of care meeting and they want to continue with full medical support but no CPR. MD notes pt likely to not survive this illness. Will provide nutrition support if warranted. Unable to complete Nutrition-Focused physical exam at this time.   Patient is currently intubated on ventilator support MV: 15.7 L/min Temp (24hrs), Avg:95.6 F (35.3 C), Min:88.2 F (31.2 C), Max:100.3 F (37.9 C) BP: 105-84 MAP 90 Propofol: None  Medications reviewed and include: thiamine, ephineprine, levophed, phenylephrine, IV abx, 150 mEq sodium bicarb, vassopressin Labs reviewed: K 6.2 (H) Creatinine 3.39 (H) AST 1221 (H) ALT 511 (H)   Diet Order:  No diet orders on file  EDUCATION NEEDS:   Not appropriate for education at this time  Skin:  Skin Assessment: Skin Integrity Issues: Skin Integrity Issues:: Stage II Stage II: buttocks  Last BM:  10/30/17  Height:   Ht Readings from Last 1 Encounters:  10/31/17 5\' 3"  (1.6 m)    Weight:   Wt Readings from Last 1 Encounters:  10/29/17 182 lb 1.6 oz (82.6 kg)    Ideal Body Weight:  52.3 kg  BMI:  Body mass index is  32.26 kg/m.  Estimated Nutritional Needs:   Kcal:  [phone] kcal/day  Protein:  105-115 g/day  Fluid:  MD to determine    [place], LDN Clinical Nutrition Pager # - [phone]

## 2017-10-31 NOTE — Progress Notes (Signed)
CODE BLUE note:  Patient was noted to have bradycardia and have gone almost completely apneic.  Respiratory rate was reported to have dropped to 7.  She had received some narcotics earlier in the evening, but none in the prior 3 hours.  She never lost pulses.  On my arrival, she was being ventilated through an Ambu bag with oxygen saturations of 100%.  She did seem to have some agonal respirations.  Anesthesia was present and intubated the patient.  Following this, she seemed to have much improved respiratory rate which actually got into the 40s.  I wonder if she may have had a seizure.  Portable chest x-ray has been ordered to document ET tube placement, and her care is transferred back to hospitalist and intensivist.

## 2017-10-31 NOTE — Progress Notes (Signed)
[facility]  Family met to discuss goals of care. They want to continue full medical support but do not want her to go through more CPR.  Limited code order written  Roselie Awkward, MD [facility] Pager: [phone] Cell: [phone] After 3pm or if no response, call [phone]

## 2017-10-31 NOTE — Progress Notes (Signed)
[facility]  Called back to bedside for worsening hypotension Has received protamine, FFP SBP currently 60's Last ABG showed some acidosis despite bicarb gtt Some vent dyssynchrony  Plan: Bolus bicarb again x2 amps Start epinephrine drip STAT CXR to evalaute for pneumo (doubt) Rocuronium x1 dose now 1 U PRBC now  Family updated bedside (son, husband) I informed them that she is not doing well).  Additional CC time 15 minutes  Roselie Awkward, MD [facility] Pager: [phone] Cell: [phone] After 3pm or if no response, call [phone]

## 2017-10-31 NOTE — Progress Notes (Signed)
Text paged two times and phone paged about continued severe abdominal pain.  Noted MD note about possible ischemic bowel, concern for bleeding with firm distended abdomen.

## 2017-10-31 NOTE — Progress Notes (Signed)
Text paged mid level night service for additional pain management for abdominal pain.

## 2017-10-31 NOTE — Progress Notes (Signed)
Charge called mid level service and received call back.  Will have abd. CT.  Patient a little calmer with ativan, but no additional pain meds requested until we complete ct.

## 2017-10-31 NOTE — Progress Notes (Signed)
PULMONARY / CRITICAL CARE MEDICINE   Name: Brooke Garcia MRN: [id number] DOB: 12-16-1955    ADMISSION DATE:  11/03/2017 CONSULTATION DATE:  11/08/2017  REFERRING MD:  Dr. Tawanna Solo   CHIEF COMPLAINT:  Altered Mental Status   BRIEF SUMMARY:  62 y/o F, with PMH of ETOH, tobacco abuse and protein S deficiency / DVT on Xarelto found down in a hotel room altered lying in stool & urine.  She was found to have AKI, rhabdomyolysis, elevated LFT's.  CT head was negative.  ABD CT demonstrated enterocolotis and some evidence of acute cholecystitis.      SUBJECTIVE:   RN reports hypotension on 3 pressors.  On vent.  Hypothermic with temp of 88, glucose 37.    VITAL SIGNS: BP (!) 56/43   Pulse (!) 110   Temp (!) 88.2 F (31.2 C) (Core (Comment)) Comment (Src): temp foley  Resp 14   Ht 5\' 3"  (1.6 m)   Wt 182 lb 1.6 oz (82.6 kg)   SpO2 (!) 84%   BMI 32.26 kg/m   HEMODYNAMICS:    VENTILATOR SETTINGS: Vent Mode: PRVC FiO2 (%):  [60 %-100 %] 60 % Set Rate:  [16 bmp] 16 bmp Vt Set:  [500 mL] 500 mL PEEP:  [5 cmH20] 5 cmH20 Plateau Pressure:  [23 cmH20] 23 cmH20  INTAKE / OUTPUT: I/O last 3 completed shifts: In: 7643.5 [P.O.:245; I.V.:7148.5; IV Piggyback:250] Out: 2025 [Urine:1725; Stool:300]  PHYSICAL EXAMINATION: General: critically ill appearing female on vent, pale HEENT: MM pale, dry.  ETT.   Neuro: No response to verbal stimuli.  Pupils 24mm / =.  No gag.   CV: s1s2 rrr, ? SEM PULM: appears air hungry, tachypnea, lungs bilaterally coarse  GI: distended / tight  Extremities: warm/dry, no edema  Skin: no rashes or lesions  LABS:  BMET Recent Labs  Lab 10/30/17 0515 10/30/17 1814 10/31/17 0426  NA 138 137 139  K 3.4* 3.5 5.5*  CL 94* 92* 96*  CO2 32 31 14*  BUN 48* 42* 40*  CREATININE 3.22* 2.95* 3.60*  GLUCOSE 99 139* 64*    Electrolytes Recent Labs  Lab 10/27/17 0345  10/30/17 0515 10/30/17 1814 10/30/17 1833 10/31/17 0426  CALCIUM 8.0*   < > 7.4* 7.4*   --  6.7*  MG 1.9  --   --   --  1.0*  --   PHOS 4.2  --   --   --   --   --    < > = values in this interval not displayed.    CBC Recent Labs  Lab 10/30/17 0515 10/30/17 1814 10/31/17 0426  WBC 10.8* 15.8* 23.1*  HGB 11.1* 9.9* 5.4*  HCT 32.0* 28.8* 16.4*  PLT 129* 201 174    Coag's Recent Labs  Lab 11/07/2017 1429 10/27/17 1742 10/31/17 0530  APTT  --  34  --   INR 1.09  --  2.25    Sepsis Markers Recent Labs  Lab 10/28/17 0155 10/29/17 0316 10/30/17 0515 10/31/17 0426  LATICACIDVEN 2.1* 1.2  --  15.4*  PROCALCITON  --  3.35 1.87 0.87    ABG Recent Labs  Lab 10/31/2017 1920 10/31/17 0213 10/31/17 0437  PHART 7.421 7.467* 7.379  PCO2ART 21.6* 26.1* 25.8*  PO2ART 85.7 123* 294*    Liver Enzymes Recent Labs  Lab 10/28/2017 1429 10/27/17 0345  AST 245* 231*  ALT 97* 91*  ALKPHOS 97 69  BILITOT 1.6* 1.5*  ALBUMIN 4.1 3.0*    Cardiac  Enzymes Recent Labs  Lab 10/30/17 1814 10/31/17 0000 10/31/17 0426  TROPONINI 0.05* 0.06* 0.18*    Glucose Recent Labs  Lab 10/23/2017 1435 10/31/17 0852  GLUCAP 132* 37*    Imaging Ct Abdomen Pelvis Wo Contrast  Result Date: 10/31/2017 CLINICAL DATA:  Uncontrolled pain.  Abdominal distention. EXAM: CT ABDOMEN AND PELVIS WITHOUT CONTRAST TECHNIQUE: Multidetector CT imaging of the abdomen and pelvis was performed following the standard protocol without IV contrast. COMPARISON:  Four days prior FINDINGS: Lower chest: Small left pleural effusion. Mild lower lobe atelectasis. Coronary atherosclerotic calcification. Hepatobiliary: Hepatic steatosis.Cholelithiasis. No evidence of acute cholecystitis. Pancreas: Unremarkable. Spleen: Unremarkable. Adrenals/Urinary Tract: Negative adrenals. No hydronephrosis or stone. The urinary bladder is decompressed by Foley catheter. Stomach/Bowel: Distended colon with gas and fluid levels. The proximal colon measures up to 8 cm. Rectal tube in place with less distention of the distal  colon. On scanogram there is small bowel distention by gas as well, without transition point. No detected wall thickening or pneumatosis. Nasogastric tube in place. The appendix is not discretely seen. No pericecal inflammation. Vascular/Lymphatic: Flat IVC. Diffuse atherosclerotic calcification. No mass or adenopathy. Reproductive:Hysterectomy. Other: There is a large retroperitoneal hematoma extending from the left diaphragm into the pelvis. Variable density collection measures up to 10 cm in thickness. Transfusion is pending per chart review. Dependent soft tissue edema. Musculoskeletal: No acute finding. Congenitally narrow spinal canal with superimposed posterior element hypertrophy causing diffuse lumbar spinal stenosis. Critical Value/emergent results were called by telephone at the time of interpretation on 10/31/2017 at 6:46 am to Dr. Zacarias Pontes , who verbally acknowledged these results. IMPRESSION: 1. Large left retroperitoneal hematoma extending from diaphragm to pelvis and measuring up to 10 cm in thickness. 2. Continue distension of small and large bowel which may reflect ileus/enterocolitis. Proximal colon distention has progressed from 4 days prior, now 9 cm. 3. Collapsed IVC as with intravascular depletion. 4. Hepatic steatosis. 5. Cholelithiasis. Electronically Signed   By: Monte Fantasia M.D.   On: 10/31/2017 06:50   Ct Head Wo Contrast  Result Date: 10/31/2017 CLINICAL DATA:  Seizure, new, nontraumatic, >40 yrs Focal neuro deficit, < 6 hrs, stroke suspected Altered level of consciousness (LOC), unexplained. Post code. EXAM: CT HEAD WITHOUT CONTRAST TECHNIQUE: Contiguous axial images were obtained from the base of the skull through the vertex without intravenous contrast. COMPARISON:  Head CT 10/14/2017 FINDINGS: Brain: Mild sulcal effacement and loss of differentiation of gray-white in the high frontal parietal lobes. Smaller ventricles on prior exam. No tonsillar herniation. No evidence of  acute ischemia or territorial infarct. The basilar cisterns are patent. No hemorrhage or subdural collection. Vascular: No hyperdense vessel. Skull: No fracture or focal lesion. Sinuses/Orbits: Minimal debris in right ethmoid air cells. Mucosal thickening of the maxillary sinuses. Other: None. IMPRESSION: Findings consistent with mild cerebral edema. No hemorrhage or acute ischemia. These results were called by telephone at the time of interpretation on 10/31/2017 at 6:42 am to Dr. Seward Carol , who verbally acknowledged these results. Electronically Signed   By: Jeb Levering M.D.   On: 10/31/2017 06:40   Dg Chest Port 1 View  Result Date: 10/31/2017 CLINICAL DATA:  Endotracheal intubation EXAM: PORTABLE CHEST 1 VIEW COMPARISON:  Chest radiograph 10/31/2017 FINDINGS: Endotracheal tube has been retracted and the tip is now 3.2 cm above the inferior carina. Left IJ approach central venous catheter tip is at the cavoatrial junction. Orogastric tube tip and side-port in the stomach. Otherwise unchanged appearance of the chest. IMPRESSION: Endotracheal  tube tip now 3.2 cm above the inferior carina. Left internal jugular vein approach central venous catheter tip at the cavoatrial junction. Electronically Signed   By: Ulyses Jarred M.D.   On: 10/31/2017 05:58   Dg Chest Port 1 View  Result Date: 10/31/2017 CLINICAL DATA:  Endotracheal intubation EXAM: PORTABLE CHEST 1 VIEW COMPARISON:  Chest radiograph 10/30/2017 FINDINGS: Endotracheal tube tip is 1.5 cm above the inferior margin of the carina. Retraction of 3.5 cm would place the tip at the level of the clavicular heads. The nasogastric tube courses beyond the diaphragm. There is left basilar atelectasis but the lungs are otherwise clear. Normal cardiomediastinal contours. IMPRESSION: Endotracheal tube tip 1.5 cm above the inferior carina. Retraction by 3.5 cm recommended. Electronically Signed   By: Ulyses Jarred M.D.   On: 10/31/2017 03:50   Dg Chest  Port 1 View  Result Date: 10/30/2017 CLINICAL DATA:  Shortness of breath. Decreased oxygen saturation and tachycardia today. EXAM: PORTABLE CHEST 1 VIEW COMPARISON:  Radiographs 10/15/2017.  CT 10/05/2017 FINDINGS: The cardiomediastinal contours are unchanged with mild aortic tortuosity. Pulmonary vasculature is normal. No consolidation, pleural effusion, or pneumothorax. No acute osseous abnormalities are seen. IMPRESSION: No acute abnormality. Electronically Signed   By: Jeb Levering M.D.   On: 10/30/2017 22:02     STUDIES:  CT Head 2/13 >> negative  CT Cervical Spine 2/13 >> multilevel degenerative changes, no acute CT ABD/Pelvis 2/13 >> 2.7 cm calculus within the dependent portion of the gallbladder, no evidence for acute cholecystitis, pan enterocolitis CT Head 2/18 >> mild cerebral edema, no hemorrhage or acute ischemia  CT ABD/Pelvis 2/18 >> large left retroperitoneal hematoma extending from the diaphragm to the pelvis measuring up to 10 cm in thickness.  Continued distention of small & large bowel which may reflect ileus / enterocolitis.  Collapsed IVC consistent with intravascular depletion.  Hepatic steatosis, cholelithiasis.   CULTURES: BCx2 2/13 >>  GI PCR 2/13 >> negative UC 2/14 >> negative  BCx2 2/14 >>   ANTIBIOTICS: Zosyn 2/13 >>   SIGNIFICANT EVENTS: 2/13  Admit with AKI, rhabdo, AMS 2/16  PCCM s/o  2/18  Hypotension, RP bleed with Hgb to 5, 3 pressors, acidosis, edema on CT head   LINES/TUBES: ETT 2/18 >>  L IJ TLC 2/18 >>   DISCUSSION: 62 y/o F with ETOH abuse, protein S deficiency on Xarelto admitted after being found down in a hotel room in feces / urine.  CT ABD on admit concerning for pan enterocolitis (C-Diff negative) thought to be related to GI ischemia with low BP.  AKI progressively improved.  Labs stable on 2/17.  Overnight 2/18 the patient developed worsening abd pain and hypotension.  Suffered respiratory arrest / intubated.  CT Abd consistent with  retroperitoneal bleed.  Requiring 3 vasopressors.    ASSESSMENT / PLAN:  PULMONARY A: Acute Respiratory Insufficiency - in setting of hypotension, retroperitoneal bleed  Tobacco Abuse  P:   PRVC 8 cc/kg  Increase rate to 32  Wean PEEP / FiO2 for sats 88-95% Repeat ABG at 1000  CARDIOVASCULAR A:  Hypovolemic Shock - in setting of retroperitoneal bleed Bradycardic / Respiratory Arrest - in setting of RP bleed, fentanyl / ativan Hypothermia   P:  Volume resuscitation with PRBC's  ICU monitoring Levophed, Neosynephrine, Vasopressin for MAP > 65 Bair hugger  Trend lactate Solu-Cortef 50 mg IV Q6  RENAL A:   AKI - initially with rhabdo due to unknown downtime at hotel + diarrheal illness.  Second insult of hypotension on 2/18 with retroperitoneal bleed.  No hydronephrosis on CT.  Hypomagnesemia  P:   Repeat labs post transfusion  Trend BMP / urinary output Replace electrolytes as indicated, Mg+ 2/18  Avoid nephrotoxic agents, ensure adequate renal perfusion Now bicarbonate x1 amp Change bicarbonate gtt to dextrose with 3 amps bicarb at 125   GASTROINTESTINAL A:   Retroperitoneal Bleed - new 2/18, large on CT  Suspected UGIB - coffee ground / red material from NGT 2/18  Suspected Ileus - as seen on CT 2/18 Diarrhea - thought related to initial ischemic insult / hypotension  Suspected Ischemic Bowel Injury Hepatitis C P:   GI following, appreciate input  Monitor output  Bicarbonate as above See ID  Doubt role for surgical intervention, CCS consulted per Dr. Maurine Minister Protonix IV gtt  NGT to LIS   HEMATOLOGIC A:   Hypercoagulable State / Protein S Deficiency  Hx DVT on Xarelto  P:  Hold further heparin gtt  Transfuse 2 units PRBC's  1 unit FFP  CBC Q6  Protamine dosing per pharmacy  Will need to address possible IVC once stabilized  SCD's for DVT prophylaxis   INFECTIOUS A:   Diarrhea - c-diff negative, GI virus panel negative 2/13  P:   Monitor fever  curve / WBC trend ABX as above  ENDOCRINE A:   Hypoglycemia    P:   Change maintenance IVF's to dextrose with bicarb Follow glucose Q4   NEUROLOGIC A:   Acute Encephalopathy - metabolic + cerebral edema  Mild Cerebral Edema - post respiratory arrest 2/18 ? Seizure - post respiratory arrest > ? Myoclonus with edema Hx ETOH Abuse  P:   RASS goal: 0 PRN fentanyl only  Frequent neuro checks  Continue thiamine / folate    FAMILY  - Updates:  Family updated extensively on patients status.  They understand that she is critically ill.  We have discussed the concept of CPR and they are going to discuss as a family.     CC Time:  61 minutes   Noe Gens, NP-C [facility] Pgr: [phone] or if no answer [phone] 10/31/2017, 9:04 AM

## 2017-10-31 NOTE — Progress Notes (Signed)
   Courtesy visits to patient bedside on 10/31/17, family members including patient's son at bedside, Ms Noe Gens, NP at bedside.  Patient apparently developed retroperitoneal hemorrhage overnight while on IV heparin drip with hemorrhagic shock and significant anemia.  Patient is now intubated, hemodynamically unstable, requiring pressors  Hospitalist service will sign off at this time  PCCM service will call us back when patient is ready to be transferred out of ICU   Roxan Hockey, MD

## 2017-10-31 NOTE — Consult Note (Addendum)
.. ..  Name: Brooke Garcia MRN: [id number] DOB: 07-09-1956    ADMISSION DATE:  11/09/2017 CONSULTATION DATE:  10/30/17  REFERRING MD :  Denton Brick MD  CHIEF COMPLAINT:  S/P CODE BLUE- NEVER LOST PULSE  BRIEF PATIENT DESCRIPTION:  Pt known to PCCM service (recently signed off on 2/15) presents s/p code blue found to be bradycardic and apneic RR 7 after receiving IV Ativan and Fentanyl. Per ER physician Roxanne Mins pr never lost pulse. She was intubated emergently and transferred to ICU.   SIGNIFICANT EVENTS  Post bradycardic and apneic episode never lost pulse Post intubation -> shock requiring max neo ggt   STUDIES:  CXR post intubation ABG post intubation CT AB/PELV CTH w/o contrast ECHO pending   HISTORY OF PRESENT ILLNESS: ( History obtained from EMR and the account of other medical providers including RN, RRT and ELink)  62 yr old female last seen by PCCM on 10/29/16.  PMHx significant for COPD, DVT with Hypercoagulable state and Protein S deficiency, HLD, HTN, ETOH and Tobacco use presented initially to [facility] after being found down in a hotel room in an altered mental state laying in her own stool and urine. She was brought to the ED for evaluation and suspected to have rhabdomyolysis.   Initial labs showed a lactic acid of 5.79, elevated CK of 9,306, elevated liver enzymes, hypokalemia of 2.9 and AKI with Cr of 5.10 and BUN of 58. ED imaging was negative for acute processes of the head, chest but showed some evidence of acute cholecystitis on CT abdomen. She was admitted for further management  On 10/31/17 at 3 AM per the documentation of Dr Roxanne Mins and the account of other medical providers, Patient was noted to have bradycardia and have gone almost completely apneic.  Respiratory rate was reported to have dropped to 7. Previously per Elink's note she was having increasing agitation and was given Ativan and Fentanyl IV. She never lost pulses.  Pt was intubated by Anesthesia with a 7.5 ETT  secured at 23. During these events her Hepatin IV infusion continued. At 3:26 AM pt was profoundly hypotensive and was started on Neo ggt. Resp placed an A-line and upon my arrival I placed LIJ CVC.   PAST MEDICAL HISTORY :   has a past medical history of Acute venous embolism and thrombosis of unspecified deep vessels of lower extremity, Anxiety state, unspecified, Arthritis, Blood dyscrasia, Calculus of kidney, Colitis, Complete rupture of rotator cuff, COPD (chronic obstructive pulmonary disease) ([place]), Degeneration of intervertebral disc, site unspecified, Depression, DVT (deep venous thrombosis) ([place]), Family history of ischemic heart disease, History of gallstones, HLD (hyperlipidemia), HTN (hypertension), Other and unspecified noninfectious gastroenteritis and colitis(558.9), Painful respiration, Primary hypercoagulable state ([place]), Protein S deficiency ([place]), Renal failure, Tobacco use disorder, Unspecified urinary incontinence, and Venous stasis ulcers ([place]).  has a past surgical history that includes Thrombectomy / embolectomy femoral artery; Nasal sinus surgery; Tubal ligation; Rotator cuff repair; tubal ligation reversal; Colonoscopy (04/27/2012); Endovenous ablation saphenous vein w/ laser (Left, 07-04-2014); and Endovenous ablation saphenous vein w/ laser (Right, 07-17-2014). Prior to Admission medications   Medication Sig Start Date End Date Taking? Authorizing Provider  ALPRAZolam Duanne Moron) 1 MG tablet Take 1 tablet (1 mg total) by mouth 2 (two) times daily as needed for anxiety. 06/09/17   Janith Lima, MD  arformoterol (BROVANA) 15 MCG/2ML NEBU Take 2 mLs (15 mcg total) by nebulization 2 (two) times daily. 09/08/17   Janith Lima, MD  chlorthalidone (HYGROTON) 25 MG  tablet TAKE 1 TABLET(25 MG) BY MOUTH DAILY 07/20/17   Janith Lima, MD  Glycopyrrolate [facility] REFILL KIT) 25 MCG/ML SOLN Inhale 1 Act into the lungs 2 (two) times daily. 09/08/17   Janith Lima, MD    irbesartan (AVAPRO) 300 MG tablet TAKE 1 TABLET(300 MG) BY MOUTH DAILY 09/08/17   Janith Lima, MD  [facility] MAGNAIR STARTER KIT 25 MCG/ML SOLN Use one vial via Magnair device twice a day. Generic: Glycopyrrolate 09/22/17   Janith Lima, MD  oxyCODONE (ROXICODONE) 15 MG immediate release tablet Take 1 tablet (15 mg total) by mouth every 4 (four) hours as needed for pain. 10/06/17   Janith Lima, MD  pantoprazole (PROTONIX) 40 MG tablet TAKE 1 TABLET(40 MG) BY MOUTH DAILY 06/09/17   Janith Lima, MD  Rivaroxaban (XARELTO) 15 MG TABS tablet TAKE 1 TABLET(15 MG) BY MOUTH DAILY WITH SUPPER 06/09/17   Janith Lima, MD  thiamine (VITAMIN B-1) 100 MG tablet Take 1 tablet (100 mg total) by mouth daily. 09/12/17   Janith Lima, MD   Allergies  Allergen Reactions  . Butalbital-Apap-Caffeine Hives  . Naproxen Hives  . Tylenol [Acetaminophen] Other (See Comments)    Upset stomach    FAMILY HISTORY:  family history includes Alzheimer's disease in her maternal grandfather; Breast cancer in her mother; Cancer in her father; Clotting disorder in her father; Colon cancer in her maternal grandmother; Diabetes in her father and sister; Heart attack in her father and paternal grandmother; Heart disease in her father and maternal grandmother; Hypertension in her brother and maternal grandmother; Lung cancer in her father; Other in her father; Stroke in her mother; Tuberculosis in her paternal grandmother; Ulcers in her brother. SOCIAL HISTORY:  reports that she has been smoking cigarettes.  She has a 15.00 pack-year smoking history. she has never used smokeless tobacco. She reports that she does not drink alcohol or use drugs.  REVIEW OF SYSTEMS:  (unable to obtain due to encephalopathy pt is intubated and has a h/o acute metabolic encephalopathy secondary to poss ETOH withdrawal now compounded by recent bradycardic and apneic event) Constitutional: Negative for fever, chills, weight loss,  malaise/fatigue and diaphoresis.  HENT: Negative for hearing loss, ear pain, nosebleeds, congestion, sore throat, neck pain, tinnitus and ear discharge.   Eyes: Negative for blurred vision, double vision, photophobia, pain, discharge and redness.  Respiratory: Negative for cough, hemoptysis, sputum production, shortness of breath, wheezing and stridor.   Cardiovascular: Negative for chest pain, palpitations, orthopnea, claudication, leg swelling and PND.  Gastrointestinal: Negative for heartburn, nausea, vomiting, abdominal pain, diarrhea, constipation, blood in stool and melena.  Genitourinary: Negative for dysuria, urgency, frequency, hematuria and flank pain.  Musculoskeletal: Negative for myalgias, back pain, joint pain and falls.  Skin: Negative for itching and rash.  Neurological: Negative for dizziness, tingling, tremors, sensory change, speech change, focal weakness, seizures, loss of consciousness, weakness and headaches.  Endo/Heme/Allergies: Negative for environmental allergies and polydipsia. Does not bruise/bleed easily.  SUBJECTIVE:   VITAL SIGNS: Temp:  [97.5 F (36.4 C)-100.3 F (37.9 C)] 97.5 F (36.4 C) (02/18 0320) Pulse Rate:  [73-124] 122 (02/18 0100) Resp:  [14-51] 14 (02/18 0320) BP: (91-174)/(44-109) 91/44 (02/18 0310) SpO2:  [94 %-98 %] 97 % (02/18 0100) FiO2 (%):  [100 %] 100 % (02/18 0305)  PHYSICAL EXAMINATION: General:  intubated Neuro:  Upward gaze, difficult to assess pupil size, no gag, no withdrawal to apinful stimuli HEENT:  ETT and OGT in  oropharynx Cardiovascular:  S1 and S2 appreciated  Lungs:  Coarse bilaterally Abdomen:  Distended, markedly rigid,hypoactive BS Musculoskeletal:  No gross deformities, evidence of PVD and varicose veins Skin:  +pressure ulcer (as documented by nursing)  LINES: LIJ CVC, right radial A-line TUBES: ETT, OGT CATHETER: Indwelling Foley  Recent Labs  Lab 10/29/17 0316 10/30/17 0515 10/30/17 1814  NA 141 138  137  K 3.2* 3.4* 3.5  CL 100* 94* 92*  CO2 26 32 31  BUN 64* 48* 42*  CREATININE 4.45* 3.22* 2.95*  GLUCOSE 102* 99 139*   Recent Labs  Lab 10/29/17 0316 10/30/17 0515 10/30/17 1814  HGB 11.9* 11.1* 9.9*  HCT 33.5* 32.0* 28.8*  WBC 13.6* 10.8* 15.8*  PLT 126* 129* 201   Dg Chest Port 1 View  Result Date: 10/31/2017 CLINICAL DATA:  Endotracheal intubation EXAM: PORTABLE CHEST 1 VIEW COMPARISON:  Chest radiograph 10/30/2017 FINDINGS: Endotracheal tube tip is 1.5 cm above the inferior margin of the carina. Retraction of 3.5 cm would place the tip at the level of the clavicular heads. The nasogastric tube courses beyond the diaphragm. There is left basilar atelectasis but the lungs are otherwise clear. Normal cardiomediastinal contours. IMPRESSION: Endotracheal tube tip 1.5 cm above the inferior carina. Retraction by 3.5 cm recommended. Electronically Signed   By: Ulyses Jarred M.D.   On: 10/31/2017 03:50   Dg Chest Port 1 View  Result Date: 10/30/2017 CLINICAL DATA:  Shortness of breath. Decreased oxygen saturation and tachycardia today. EXAM: PORTABLE CHEST 1 VIEW COMPARISON:  Radiographs 10/16/2017.  CT 10/05/2017 FINDINGS: The cardiomediastinal contours are unchanged with mild aortic tortuosity. Pulmonary vasculature is normal. No consolidation, pleural effusion, or pneumothorax. No acute osseous abnormalities are seen. IMPRESSION: No acute abnormality. Electronically Signed   By: Jeb Levering M.D.   On: 10/30/2017 22:02    ASSESSMENT / PLAN: NEURO: Acute metabolic encephalopathy H/O ETOH use ? Seizures-> bradycardic apneic episode  (? Post ictal) After intubation it was noted patient had twitching of the right jaw GCS 3 not on any sedation CTH w/o contrast shows cerebral edema per Rad on call Consult Neuro  CARDIAC: Hemorrhagic Shock Significant retroperitoneal Hemorrhage Heparin held Hgb 5.5 Type and screen stat and  will transfuse PRBCs Bleeding from OGT susp UGIB-  only 150cc in cannister Continue on IVF Concern for Abdominal compartment syndrome Vasopressors --> Neo ggt peripherally was started pt requiring more Now has LIJ CVC -> get CVP Start on Levophed and Vasopressin as MAP improves will wean off Neo MAP goal >75mHg Lactic Acid 15 will trend Last ECHO in 2016 EF: 55-60% Will need 2 D ECHO to assess LVEF  PULMONARY: Apneic episode, Inability to protect airway Acute hypoxic resp failure Required emergent intubation Continues on PRVC 16/TV 500/P 5 FiO2 100% = 7.379/25/294/15 Wean FiO2 to keep O2 sat >92% CXR post CVC and ETT pull back by 2 cm Reviewed: ETT in satisfactory position and CVC good. No pneumothorax   ID: + MRSA Urine cx from 4 days ago negative to date Blood cx from 3 days no growth to date  No confirmed source of infection  Leukocytosis noted 10->15->23 T max 100.3 deg F at 2006 on 2/17 since  No fevers Previously on Zosyn started on 10/17/2017 Sepsis PCT algorithm If PCT negative discontinue antibiotics Trend WBC, fever curve, LA  Endocrine: No h/o insulin dependence Will check TSH and Cortiosl level If BG >1848mdl start on ISS Last glucose 64  GI: Acute retroperitoneal hematoma Abdomen distended and  rigid with elevated Lactate-possible abdominal compartment syndrome Discussed scans with Rad on call Hold Heparin Transfusion ordered Sx consult however patient not an ideal candidate and may be best to let it just tamponade itself  Acute UGIB OGT - 150 cc of coffee ground now more bright red NPO- no TF at this time On protonix IV BID CTA/P stat pending when patient is hemodynamically stable  Heme: Symptomatic Anemia Hemorrhagic Shock confirmed source of bleeding is Large retroperitoneal hematoma Hgb 5.5, Hct 16.4 Plts 174 Type and screen and Transfuse 2 u PRBCs stat Patient has a h/o Protein S deficiency and hypercoagulable state Stopped Heparin ggt- risk due to hypercoagulable state check PTT, PT and  INR If INR>2 transfuse FFP DVT ppx: bilateral SCDs   RENAL Acute Kidney Injury-secondary to previous hypotensive episode And now retroperitoneal hematoma Now on Vasopressors Concern for worsening injury Indwelling Foley catheter strict Is and Os Trend BUN and Creatinine Current GFR 13 Avoid nephrotoxic medications and contrast Initial CK 9306--> it trended down   Repeat CK now post event  Lab Results  Component Value Date   CREATININE 2.95 (H) 10/30/2017   CREATININE 3.22 (H) 10/30/2017   CREATININE 4.45 (H) 10/29/2017   Replace electrolytes as needed AG Metabolic Acidosis  Secondary to LA 15 Continue on IVF Pt was on Bicarb ggt 11mq at 125cc/hr Latest ABG 7.379/25/294/15 Decreased rate of Bicarb drip to 50cc/hr    I, Dr KSeward Carolhave personally reviewed patient's available data, including medical history, events of note, physical examination and test results as part of my evaluation. I have discussed with NP and other care providers such as pharmacist, RN and Elink. The patient is critically ill with multiple organ systems failure and requires high complexity decision making for assessment and support, frequent evaluation and titration of therapies, application of advanced monitoring technologies and extensive interpretation of multiple databases. Critical Care Time devoted to patient care services described in this note is [id number]. This time reflects time of care of this signee Dr KSeward Carol This critical care time does not reflect procedure time, or teaching time or supervisory time of NP but could involve care discussion time    DISPOSITION: ICU CC TIME: 65 mins PROGNOSIS: POOR FAMILY: none at bedside at time of my eval- [place] updated her on severity of condition. Will call rest of family and is coming in. CODE STATUS: FULL   Signed Dr KSeward CarolPulmonary Critical Care Locums   10/31/2017,  4:34 AM

## 2017-10-31 NOTE — Progress Notes (Addendum)
[facility] ROUNDING NOTE   Subjective:   This is a 62 year old lady that was found in semi conscious state in her excrement by family. She resides in a motel room and was encephalopathic and hypotensive on presentation. She has a history of alcohol abuse and drinks daily. Their is the possibility of a thoracic aneurysm on report and she has a protein s deficiency and hepatitis C In Feb 2018 , her renal function was in the normal range   On presentation she was hypotensive and responded rapidly to a fluid bolus  Her creatinine was 5.1   and white blood count 30.5  Ammonia was normal and ethanol undetectable  Troponin increased slightly 0.48  Lactate level was 2.2   Chest CT performed 6/78 showed uncomplicated aneurysm 4.2 cm  She has been started on vancomycin and zosyn  Last night pt developed severe hypotension and hypothermia, is now on 3 pressors and creat 2.9 > 3.6, WBC up  23 > 41k.  Transfused from 5.4 up to 7.4.  Large RP bleed on abd CT.  BP's have been labile, and very low at times in the 93'Y - 10'F systolic.   Objective:  Vital signs in last 24 hours:  Temp:  [88.2 F (31.2 C)-100.3 F (37.9 C)] 97.9 F (36.6 C) (02/18 1700) Pulse Rate:  [70-161] 112 (02/18 1630) Resp:  [14-51] 32 (02/18 1630) BP: (35-174)/(23-101) 105/84 (02/18 1100) SpO2:  [59 %-100 %] 96 % (02/18 1700) Arterial Line BP: (48-156)/(32-89) 78/62 (02/18 1700) FiO2 (%):  [60 %-100 %] 60 % (02/18 1700)  Weight change:  Filed Weights   10/28/17 0600 10/29/17 0401  Weight: 88.5 kg (195 lb 1.7 oz) 82.6 kg (182 lb 1.6 oz)    Intake/Output: I/O last 3 completed shifts: In: 7643.5 [P.O.:245; I.V.:7148.5; IV Piggyback:250] Out: 2025 [Urine:1725; Stool:300]   Intake/Output this shift:  Total I/O In: 6578.1 [I.V.:5005.2; Blood:1572.9] Out: -   CVS- RRR RS- CTA ABD- BS present soft non-distended EXT- no edema   Basic Metabolic Panel: Recent Labs  Lab 10/27/17 0345  10/29/17 0316  10/30/17 0515 10/30/17 1814 10/30/17 1833 10/31/17 0426 10/31/17 1151  NA 145   < > 141 138 137  --  139 140  K 3.4*   < > 3.2* 3.4* 3.5  --  5.5* 6.2*  CL 111   < > 100* 94* 92*  --  96* 94*  CO2 15*   < > 26 32 31  --  14* 13*  GLUCOSE 111*   < > 102* 99 139*  --  64* 139*  BUN 62*   < > 64* 48* 42*  --  40* 37*  CREATININE 5.07*   < > 4.45* 3.22* 2.95*  --  3.60* 3.39*  CALCIUM 8.0*   < > 7.7* 7.4* 7.4*  --  6.7* 5.6*  MG 1.9  --   --   --   --  1.0*  --   --   PHOS 4.2  --   --   --   --   --   --   --    < > = values in this interval not displayed.    Liver Function Tests: Recent Labs  Lab 10/30/2017 1429 10/27/17 0345 10/31/17 1151  AST 245* 231* 1,221*  ALT 97* 91* 511*  ALKPHOS 97 69 64  BILITOT 1.6* 1.5* 1.8*  PROT 9.4* 6.9 3.3*  ALBUMIN 4.1 3.0* 1.5*   No results for input(s): LIPASE,  AMYLASE in the last 168 hours. Recent Labs  Lab 11/10/2017 1429 10/31/17 1151  AMMONIA 17 25    CBC: Recent Labs  Lab 10/20/2017 1429  10/27/17 0816 10/28/17 0155 10/29/17 0316 10/30/17 0515 10/30/17 1814 10/31/17 0426 10/31/17 1151  WBC 30.5*  --  23.4* 19.5* 13.6* 10.8* 15.8* 23.1* 41.3*  NEUTROABS 26.2*  --  19.5* 16.3*  --   --   --   --   --   HGB 18.1*   < > 16.2* 13.1 11.9* 11.1* 9.9* 5.4* 7.4*  HCT 48.4*   < > 44.8 36.3 33.5* 32.0* 28.8* 16.4* 21.9*  MCV 94.2  --  95.5 94.5 95.4 97.6 97.6 100.6* 97.3  PLT 266  --  174 150 126* 129* 201 174 84*   < > = values in this interval not displayed.    Cardiac Enzymes: Recent Labs  Lab 10/27/17 0345 10/28/17 0155 10/29/17 0316 10/30/17 0515 10/30/17 1814 10/31/17 0000 10/31/17 0426 10/31/17 1151  CKTOTAL 6,917* 4,627* 1,732* 724*  --   --  453*  --   TROPONINI  --   --   --   --  0.05* 0.06* 0.18* 2.37*    BNP: Invalid input(s): POCBNP  CBG: Recent Labs  Lab 10/27/2017 1435 10/31/17 0852 10/31/17 0909 10/31/17 1231 10/31/17 1612  GLUCAP 132* 37* 160* 57 152*    Microbiology: Results for orders  placed or performed during the hospital encounter of 11/05/2017  Blood Cultures (routine x 2)     Status: None   Collection Time: 10/14/2017  2:32 PM  Result Value Ref Range Status   Specimen Description   Final    BLOOD LEFT ANTECUBITAL Performed at [facility] [address], [place], [place] [address]    Special Requests   Final    BOTTLES DRAWN AEROBIC AND ANAEROBIC Blood Culture adequate volume Performed at [facility] [address], [place], [place] [address]    Culture   Final    NO GROWTH 5 DAYS Performed at [facility], [place] [address], [place], [place] [address]    Report Status 10/31/2017 FINAL  Final  Gastrointestinal Panel by PCR , Stool     Status: None   Collection Time: 11/04/2017  7:00 PM  Result Value Ref Range Status   Campylobacter species NOT DETECTED NOT DETECTED Final   Plesimonas shigelloides NOT DETECTED NOT DETECTED Final   Salmonella species NOT DETECTED NOT DETECTED Final   Yersinia enterocolitica NOT DETECTED NOT DETECTED Final   Vibrio species NOT DETECTED NOT DETECTED Final   Vibrio cholerae NOT DETECTED NOT DETECTED Final   Enteroaggregative E coli (EAEC) NOT DETECTED NOT DETECTED Final   Enteropathogenic E coli (EPEC) NOT DETECTED NOT DETECTED Final   Enterotoxigenic E coli (ETEC) NOT DETECTED NOT DETECTED Final   Shiga like toxin producing E coli (STEC) NOT DETECTED NOT DETECTED Final   Shigella/Enteroinvasive E coli (EIEC) NOT DETECTED NOT DETECTED Final   Cryptosporidium NOT DETECTED NOT DETECTED Final   Cyclospora cayetanensis NOT DETECTED NOT DETECTED Final   Entamoeba histolytica NOT DETECTED NOT DETECTED Final   Giardia lamblia NOT DETECTED NOT DETECTED Final   Adenovirus F40/41 NOT DETECTED NOT DETECTED Final   Astrovirus NOT DETECTED NOT DETECTED Final   Norovirus GI/GII NOT DETECTED NOT DETECTED Final   Rotavirus A NOT DETECTED NOT DETECTED Final   Sapovirus (I, II, IV, and V) NOT DETECTED NOT  DETECTED Final    Comment: Performed at  [facility] [place], [place] [address]  C difficile quick scan w PCR reflex     Status: None   Collection Time: 10/14/2017  7:00 PM  Result Value Ref Range Status   C Diff antigen NEGATIVE NEGATIVE Final   C Diff toxin NEGATIVE NEGATIVE Final   C Diff interpretation No C. difficile detected.  Final    Comment: Performed at [facility], [place] [address], [place], [place] [address]  Blood Cultures (routine x 2)     Status: None (Preliminary result)   Collection Time: 10/27/17 12:50 AM  Result Value Ref Range Status   Specimen Description   Final    BLOOD RIGHT HAND Performed at [facility] [address] [place], [place] [address]    Special Requests   Final    IN PEDIATRIC BOTTLE Blood Culture adequate volume Performed at [facility] [address], [place], [place] [address]    Culture   Final    NO GROWTH 4 DAYS Performed at [facility], [place] [address], [place], [place] [address]    Report Status PENDING  Incomplete  Urine culture     Status: None   Collection Time: 10/27/17  3:02 PM  Result Value Ref Range Status   Specimen Description   Final    URINE, CATHETERIZED Performed at [facility] [address], [place], [place] [address]    Special Requests   Final    NONE Performed at [facility], [place] [address], [place], [place] [address]    Culture   Final    NO GROWTH Performed at [facility], [place] [address], [place], [place] [address]    Report Status 10/28/2017 FINAL  Final  MRSA PCR Screening     Status: Abnormal   Collection Time: 10/28/17  9:27 AM  Result Value Ref Range Status   MRSA by PCR POSITIVE (A) NEGATIVE Final    Comment:        The GeneXpert MRSA Assay (FDA approved for NASAL specimens only), is one component of a comprehensive MRSA colonization surveillance program. It is  not intended to diagnose MRSA infection nor to guide or monitor treatment for MRSA infections. RESULT CALLED TO, READ BACK BY AND VERIFIED WITH: GROGAN,B @ 1517 [place] [id number] BY POTEAT,S Performed at [facility] [address] [place],  [address]     Coagulation Studies: Recent Labs    10/31/17 0530  LABPROT 24.6*  INR 2.25    Urinalysis: No results for input(s): COLORURINE, LABSPEC, PHURINE, GLUCOSEU, HGBUR, BILIRUBINUR, KETONESUR, PROTEINUR, UROBILINOGEN, NITRITE, LEUKOCYTESUR in the last 72 hours.  Invalid input(s): APPERANCEUR    Imaging: Ct Abdomen Pelvis Wo Contrast  Result Date: 10/31/2017 CLINICAL DATA:  Uncontrolled pain.  Abdominal distention. EXAM: CT ABDOMEN AND PELVIS WITHOUT CONTRAST TECHNIQUE: Multidetector CT imaging of the abdomen and pelvis was performed following the standard protocol without IV contrast. COMPARISON:  Four days prior FINDINGS: Lower chest: Small left pleural effusion. Mild lower lobe atelectasis. Coronary atherosclerotic calcification. Hepatobiliary: Hepatic steatosis.Cholelithiasis. No evidence of acute cholecystitis. Pancreas: Unremarkable. Spleen: Unremarkable. Adrenals/Urinary Tract: Negative adrenals. No hydronephrosis or stone. The urinary bladder is decompressed by Foley catheter. Stomach/Bowel: Distended colon with gas and fluid levels. The proximal colon measures up to 8 cm. Rectal tube in place with less distention of the distal colon. On scanogram there is small bowel distention by gas as well, without transition point.  No detected wall thickening or pneumatosis. Nasogastric tube in place. The appendix is not discretely seen. No pericecal inflammation. Vascular/Lymphatic: Flat IVC. Diffuse atherosclerotic calcification. No mass or adenopathy. Reproductive:Hysterectomy. Other: There is a large retroperitoneal hematoma extending from the left diaphragm into the pelvis. Variable density collection measures up to 10 cm in  thickness. Transfusion is pending per chart review. Dependent soft tissue edema. Musculoskeletal: No acute finding. Congenitally narrow spinal canal with superimposed posterior element hypertrophy causing diffuse lumbar spinal stenosis. Critical Value/emergent results were called by telephone at the time of interpretation on 10/31/2017 at 6:46 am to Dr. Zacarias Pontes , who verbally acknowledged these results. IMPRESSION: 1. Large left retroperitoneal hematoma extending from diaphragm to pelvis and measuring up to 10 cm in thickness. 2. Continue distension of small and large bowel which may reflect ileus/enterocolitis. Proximal colon distention has progressed from 4 days prior, now 9 cm. 3. Collapsed IVC as with intravascular depletion. 4. Hepatic steatosis. 5. Cholelithiasis. Electronically Signed   By: Monte Fantasia M.D.   On: 10/31/2017 06:50   Ct Head Wo Contrast  Result Date: 10/31/2017 CLINICAL DATA:  Seizure, new, nontraumatic, >40 yrs Focal neuro deficit, < 6 hrs, stroke suspected Altered level of consciousness (LOC), unexplained. Post code. EXAM: CT HEAD WITHOUT CONTRAST TECHNIQUE: Contiguous axial images were obtained from the base of the skull through the vertex without intravenous contrast. COMPARISON:  Head CT 10/17/2017 FINDINGS: Brain: Mild sulcal effacement and loss of differentiation of gray-white in the high frontal parietal lobes. Smaller ventricles on prior exam. No tonsillar herniation. No evidence of acute ischemia or territorial infarct. The basilar cisterns are patent. No hemorrhage or subdural collection. Vascular: No hyperdense vessel. Skull: No fracture or focal lesion. Sinuses/Orbits: Minimal debris in right ethmoid air cells. Mucosal thickening of the maxillary sinuses. Other: None. IMPRESSION: Findings consistent with mild cerebral edema. No hemorrhage or acute ischemia. These results were called by telephone at the time of interpretation on 10/31/2017 at 6:42 am to Dr. Seward Carol  , who verbally acknowledged these results. Electronically Signed   By: Jeb Levering M.D.   On: 10/31/2017 06:40   Dg Chest Port 1 View  Result Date: 10/31/2017 CLINICAL DATA:  Intubated.  Hypotension. EXAM: PORTABLE CHEST 1 VIEW COMPARISON:  10/31/2017 FINDINGS: Cardiomediastinal silhouette is unchanged. An endotracheal tube with tip 2.8 cm above the carina, NG tube with tip overlying the stomach and left IJ central venous catheter with tip overlying the superior cavoatrial junction again noted. . There is no evidence of pneumothorax, airspace disease or consolidation. There may be trace bilateral pleural effusions present. IMPRESSION: Unchanged appearance the chest with support apparatus as described. Question trace pleural effusions. Electronically Signed   By: Margarette Canada M.D.   On: 10/31/2017 13:34   Dg Chest Port 1 View  Result Date: 10/31/2017 CLINICAL DATA:  Endotracheal intubation EXAM: PORTABLE CHEST 1 VIEW COMPARISON:  Chest radiograph 10/31/2017 FINDINGS: Endotracheal tube has been retracted and the tip is now 3.2 cm above the inferior carina. Left IJ approach central venous catheter tip is at the cavoatrial junction. Orogastric tube tip and side-port in the stomach. Otherwise unchanged appearance of the chest. IMPRESSION: Endotracheal tube tip now 3.2 cm above the inferior carina. Left internal jugular vein approach central venous catheter tip at the cavoatrial junction. Electronically Signed   By: Ulyses Jarred M.D.   On: 10/31/2017 05:58   Dg Chest Port 1 View  Result Date: 10/31/2017 CLINICAL DATA:  Endotracheal intubation EXAM: PORTABLE CHEST 1 VIEW  COMPARISON:  Chest radiograph 10/30/2017 FINDINGS: Endotracheal tube tip is 1.5 cm above the inferior margin of the carina. Retraction of 3.5 cm would place the tip at the level of the clavicular heads. The nasogastric tube courses beyond the diaphragm. There is left basilar atelectasis but the lungs are otherwise clear. Normal  cardiomediastinal contours. IMPRESSION: Endotracheal tube tip 1.5 cm above the inferior carina. Retraction by 3.5 cm recommended. Electronically Signed   By: Ulyses Jarred M.D.   On: 10/31/2017 03:50   Dg Chest Port 1 View  Result Date: 10/30/2017 CLINICAL DATA:  Shortness of breath. Decreased oxygen saturation and tachycardia today. EXAM: PORTABLE CHEST 1 VIEW COMPARISON:  Radiographs 10/17/2017.  CT 10/05/2017 FINDINGS: The cardiomediastinal contours are unchanged with mild aortic tortuosity. Pulmonary vasculature is normal. No consolidation, pleural effusion, or pneumothorax. No acute osseous abnormalities are seen. IMPRESSION: No acute abnormality. Electronically Signed   By: Jeb Levering M.D.   On: 10/30/2017 22:02     Medications:   . epinephrine 20 mcg/min (10/31/17 1604)  . norepinephrine (LEVOPHED) Adult infusion 40 mcg/min (10/31/17 1703)  . pantoprozole (PROTONIX) infusion 8 mg/hr (10/31/17 1600)  . phenylephrine (NEO-SYNEPHRINE) Adult infusion 400 mcg/min (10/31/17 1645)  . piperacillin-tazobactam (ZOSYN)  IV 2.25 g (10/31/17 1712)  .  sodium bicarbonate  infusion 1000 mL 125 mL/hr at 10/31/17 1600  . vasopressin (PITRESSIN) infusion - *FOR SHOCK* 0.03 Units/min (10/31/17 1600)   . Chlorhexidine Gluconate Cloth  6 each Topical Q0600  . dicyclomine  10 mg Oral TID AC & HS  . fentaNYL (SUBLIMAZE) injection  50 mcg Intravenous Once  . hydrocortisone sod succinate (SOLU-CORTEF) inj  50 mg Intravenous Q6H  . levalbuterol  0.63 mg Nebulization TID  . mupirocin ointment  1 application Nasal BID  . [START ON 11/03/2017] pantoprazole  40 mg Intravenous Q12H  . thiamine injection  100 mg Intravenous Daily   fentaNYL (SUBLIMAZE) injection, iopamidol, labetalol, levalbuterol, loperamide  Assessment/ Plan:   Acute renal failure - suspected ATN, doubt AIN or GN.  CPK has normalized. Was improving but now severe shock developing overnight in association w RP bleed, drop in Hb, ^^WBC  and hypothermia. No UOP today, creat rising again.  Pt is now limited code, no CPR or meds if arrests.  No dialysis.  No new suggestions.  Will sign off.   Hypokalemia    RP bleeds  Severe shock - on 3 pressors   Kelly Splinter MD [employer] pgr [phone]   10/31/2017, 5:34 PM

## 2017-11-01 LAB — TROPONIN I: TROPONIN I: 15.05 ng/mL — AB (ref <0.03)

## 2017-11-01 LAB — PREPARE FRESH FROZEN PLASMA: Unit division: 0

## 2017-11-01 LAB — GLUCOSE, CAPILLARY: Glucose-Capillary: 279 mg/dL — ABNORMAL HIGH (ref 65–99)

## 2017-11-01 LAB — BPAM FFP
Blood Product Expiration Date: 201902232359
ISSUE DATE / TIME: 201902181100
Unit Type and Rh: 5100

## 2017-11-01 LAB — COMPREHENSIVE METABOLIC PANEL
ALT: 3794 U/L — ABNORMAL HIGH (ref 14–54)
Alkaline Phosphatase: 700 U/L — ABNORMAL HIGH (ref 38–126)
BUN: 34 mg/dL — AB (ref 6–20)
CHLORIDE: 91 mmol/L — AB (ref 101–111)
CO2: <7 mmol/L — ABNORMAL LOW (ref 22–32)
Calcium: 5.6 mg/dL — CL (ref 8.9–10.3)
Creatinine, Ser: 3.85 mg/dL — ABNORMAL HIGH (ref 0.44–1.00)
GFR calc Af Amer: 14 mL/min — ABNORMAL LOW (ref >60)
GFR, EST NON AFRICAN AMERICAN: 12 mL/min — AB (ref >60)
GLUCOSE: 475 mg/dL — AB (ref 65–99)
Sodium: 136 mmol/L (ref 135–145)
Total Bilirubin: 1 mg/dL (ref 0.3–1.2)
Total Protein: <3 g/dL — ABNORMAL LOW (ref 6.5–8.1)

## 2017-11-01 LAB — CULTURE, BLOOD (ROUTINE X 2)
CULTURE: NO GROWTH
Special Requests: ADEQUATE

## 2017-11-01 LAB — MAGNESIUM: MAGNESIUM: 2.4 mg/dL (ref 1.7–2.4)

## 2017-11-01 LAB — CBC
HCT: 19.1 % — ABNORMAL LOW (ref 36.0–46.0)
HEMOGLOBIN: 5.9 g/dL — AB (ref 12.0–15.0)
MCH: 31.7 pg (ref 26.0–34.0)
MCHC: 30.9 g/dL (ref 30.0–36.0)
MCV: 102.7 fL — ABNORMAL HIGH (ref 78.0–100.0)
Platelets: 40 10*3/uL — ABNORMAL LOW (ref 150–400)
RBC: 1.86 MIL/uL — AB (ref 3.87–5.11)
RDW: 18.7 % — ABNORMAL HIGH (ref 11.5–15.5)
WBC: 26.1 10*3/uL — AB (ref 4.0–10.5)

## 2017-11-01 LAB — PREPARE RBC (CROSSMATCH)

## 2017-11-01 LAB — PHOSPHORUS: PHOSPHORUS: 16.1 mg/dL — AB (ref 2.5–4.6)

## 2017-11-01 MED ORDER — INSULIN ASPART 100 UNIT/ML IV SOLN
10.0000 [IU] | Freq: Once | INTRAVENOUS | Status: AC
  Administered 2017-11-01: 10 [IU] via INTRAVENOUS

## 2017-11-01 MED ORDER — DEXTROSE 50 % IV SOLN
1.0000 | Freq: Once | INTRAVENOUS | Status: AC
  Administered 2017-11-01: 50 mL via INTRAVENOUS
  Filled 2017-11-01: qty 50

## 2017-11-01 MED ORDER — SODIUM BICARBONATE 8.4 % IV SOLN
INTRAVENOUS | Status: AC
  Filled 2017-11-01: qty 100

## 2017-11-01 MED ORDER — SODIUM CHLORIDE 0.9 % IV SOLN
Freq: Once | INTRAVENOUS | Status: AC
  Administered 2017-11-01: 07:00:00 via INTRAVENOUS

## 2017-11-01 MED ORDER — SODIUM BICARBONATE 8.4 % IV SOLN
100.0000 meq | Freq: Once | INTRAVENOUS | Status: AC
  Administered 2017-11-01: 100 meq via INTRAVENOUS

## 2017-11-01 MED ORDER — SODIUM CHLORIDE 0.9 % IV SOLN
2.0000 g | Freq: Once | INTRAVENOUS | Status: AC
  Administered 2017-11-01: 2 g via INTRAVENOUS
  Filled 2017-11-01: qty 20

## 2017-11-02 LAB — TYPE AND SCREEN
ABO/RH(D): O POS
ANTIBODY SCREEN: NEGATIVE
UNIT DIVISION: 0
UNIT DIVISION: 0
Unit division: 0
Unit division: 0
Unit division: 0
Unit division: 0
Unit division: 0

## 2017-11-02 LAB — BPAM RBC
BLOOD PRODUCT EXPIRATION DATE: 201903212359
BLOOD PRODUCT EXPIRATION DATE: 201903212359
BLOOD PRODUCT EXPIRATION DATE: 201903212359
Blood Product Expiration Date: 201903192359
Blood Product Expiration Date: 201903192359
Blood Product Expiration Date: 201903212359
Blood Product Expiration Date: 201903212359
ISSUE DATE / TIME: 201902180756
ISSUE DATE / TIME: 201902180912
ISSUE DATE / TIME: 201902181314
ISSUE DATE / TIME: 201902190214
ISSUE DATE / TIME: 201902190555
UNIT TYPE AND RH: 5100
UNIT TYPE AND RH: 5100
UNIT TYPE AND RH: 5100
Unit Type and Rh: 5100
Unit Type and Rh: 5100
Unit Type and Rh: 5100
Unit Type and Rh: 5100

## 2017-11-03 ENCOUNTER — Telehealth: Payer: Self-pay

## 2017-11-03 NOTE — Telephone Encounter (Signed)
On 11/03/17 I received a d/c from [employer]. The d/c is for cremation. The patient is a patient of Doctor McQuaid. The d/c will be taken to [facility] ICU for signature.  On 11/03/2017 I received the d/c back from Doctor McQuaid. I got the d/c ready and called the funeral home to let them know the d/c is ready for pickup. I also faxed a copy to the funeral home per the funeral home request.

## 2017-11-09 ENCOUNTER — Ambulatory Visit: Payer: Medicare Other | Admitting: Cardiology

## 2017-11-11 NOTE — Progress Notes (Signed)
[facility] Progress Note Patient Name: Brooke Garcia DOB: 10-08-1955 MRN: [id number]   Date of Service  11-07-2017  HPI/Events of Note  Anemia - Hgb = 5.9. Troponin = 2.37 --> 10.07 --> 15.05 - Can't put on Heparin IV infusion or ASA d/t retroperitoneal bleed. Can't use B-Blocker d/t hypotension on Norepinephrine, Epinephrine, Phenylephrine and Vasopressin.   eICU Interventions  Will order: 1. Transfuse 2 units PRBC now.      Intervention Category Major Interventions: Other:;Acid-Base disturbance - evaluation and management  Sommer,Steven Cornelia Copa Nov 07, 2017, 5:32 AM

## 2017-11-11 NOTE — Progress Notes (Signed)
Called Elink to report new crit lab post blood admin and reported on status changes with patient.

## 2017-11-11 NOTE — Plan of Care (Signed)
Patient has remained very unstable throughout night, requiring constant evaluation and maintenance of medications.  Pressures have fallen through the night into the lower 70's.  One unit of blood was administered and follow up labs did not improve.  Patient remains unresponsive with no sedation.  Body temp has fallen through night and remains low with warming blanket on.

## 2017-11-11 NOTE — Progress Notes (Signed)
At change of shift this morning at 0700 patient was unresponsive ( not on any sedation). Had low BP with systolic in the 12'Y.Patient was hypothermic with warming blanket on. Patient was receiving Neosynephrine, Levophed and Bicarbonate drip. Patient was on comfort care.  Patient went into asystole at 0745 as evidenced on the bedside monitor. Charge RN Zoe and [place] RRT confirmed time of death at 64. Velna Hatchet, NP notified. Family at bedside at time of death.  Patients personal belongings to be sent with family.

## 2017-11-11 NOTE — Progress Notes (Signed)
I was informed that pt expired this am on PCCM Service  Roxan Hockey, MD

## 2017-11-11 NOTE — Progress Notes (Signed)
Patient removed from vent; patient deceased. Family at bedside, RT available as needed.

## 2017-11-11 NOTE — Death Summary Note (Signed)
DEATH SUMMARY   Patient Details  Name: Brooke Garcia MRN: [id number] DOB: [birth year]  Admission/Discharge Information   Admit Date:  11/07/2017  Date of Death: Date of Death: 11-13-17  Time of Death: Time of Death: 0745  Length of Stay: 6  Referring Physician: Janith Lima, MD   Reason(s) for Hospitalization    Diagnoses  Preliminary cause of death:   Ischemic colitis Secondary Diagnoses (including complications and co-morbidities):  Principal Problem:   Acute kidney injury superimposed on CKD [facility]) Active Problems:   TOBACCO ABUSE   HYPERTENSION, BENIGN ESSENTIAL   DVT of lower extremity, bilateral (HCC)   COPD, moderate ([place])   Acute renal failure (HCC)   Leucocytosis   Chronic hepatitis C without hepatic coma (HCC)   Chronic renal disease, stage 3, moderately decreased glomerular filtration rate (GFR) between 30-59 mL/min/1.73 square meter (HCC)   Loose stools   Severe sepsis (HCC)   Rhabdomyolysis   Hypokalemia   Altered mental status   Cystocele with prolapse   Elevated liver enzymes   Diarrhea   Abnormal CT scan, colon   Enterocolitis   Brief Hospital Course (including significant findings, care, treatment, and services provided and events leading to death)  Brooke Garcia is a 62 y.o. year old female who was found unresponsive in a hotel room and brought to the emergency room.  There is evidence that she had had problems with nausea vomiting and incontinence prior to being brought in by the police.  She was noted to be heavy smoker and daily drinker.  She was admitted to the ICU in the setting of diarrhea, acute kidney injury, and presumed alcohol withdrawal.  CT scan was performed and showed evidence of diffuse mesenteric swelling and bowel swelling.  GI medicine was consulted and felt that this was likely consistent with ischemic bowel given the clinical history of being found down and associated acute kidney injury another signs on labs of poor perfusion  prior to admission.  She made some improvement but unfortunately she developed a retroperitoneal bleed.  This is associated with worsening acidosis.  She was intubated for worsening respiratory failure and treated for shock.  She was given aggressive blood transfusions and her hemoglobin count stabilized but she remained in profound shock.  She had significant acidosis with a remarkably elevated serum lactic acid despite resuscitation with blood products and normalization of her hemoglobin.  It was felt that the shock state and lactic acidosis was due to recurrent ischemic bowel at this point.  Patient had an abnormal neurologic exam at this point as well and a CT scan of her brain showed cerebral edema.  With multiple conversations with patient's children and family they elected to change her CODE STATUS to DNR.  She was treated aggressively otherwise with blood product administration and IV fluids and IV vasopressors and mechanical ventilation.  She passed with her family at the bedside.    Pertinent Labs and Studies  Significant Diagnostic Studies Ct Abdomen Pelvis Wo Contrast  Result Date: 10/31/2017 CLINICAL DATA:  Uncontrolled pain.  Abdominal distention. EXAM: CT ABDOMEN AND PELVIS WITHOUT CONTRAST TECHNIQUE: Multidetector CT imaging of the abdomen and pelvis was performed following the standard protocol without IV contrast. COMPARISON:  Four days prior FINDINGS: Lower chest: Small left pleural effusion. Mild lower lobe atelectasis. Coronary atherosclerotic calcification. Hepatobiliary: Hepatic steatosis.Cholelithiasis. No evidence of acute cholecystitis. Pancreas: Unremarkable. Spleen: Unremarkable. Adrenals/Urinary Tract: Negative adrenals. No hydronephrosis or stone. The urinary bladder is decompressed by Foley catheter. Stomach/Bowel:  Distended colon with gas and fluid levels. The proximal colon measures up to 8 cm. Rectal tube in place with less distention of the distal colon. On scanogram there  is small bowel distention by gas as well, without transition point. No detected wall thickening or pneumatosis. Nasogastric tube in place. The appendix is not discretely seen. No pericecal inflammation. Vascular/Lymphatic: Flat IVC. Diffuse atherosclerotic calcification. No mass or adenopathy. Reproductive:Hysterectomy. Other: There is a large retroperitoneal hematoma extending from the left diaphragm into the pelvis. Variable density collection measures up to 10 cm in thickness. Transfusion is pending per chart review. Dependent soft tissue edema. Musculoskeletal: No acute finding. Congenitally narrow spinal canal with superimposed posterior element hypertrophy causing diffuse lumbar spinal stenosis. Critical Value/emergent results were called by telephone at the time of interpretation on 10/31/2017 at 6:46 am to Dr. Zacarias Pontes , who verbally acknowledged these results. IMPRESSION: 1. Large left retroperitoneal hematoma extending from diaphragm to pelvis and measuring up to 10 cm in thickness. 2. Continue distension of small and large bowel which may reflect ileus/enterocolitis. Proximal colon distention has progressed from 4 days prior, now 9 cm. 3. Collapsed IVC as with intravascular depletion. 4. Hepatic steatosis. 5. Cholelithiasis. Electronically Signed   By: Monte Fantasia M.D.   On: 10/31/2017 06:50   Ct Abdomen Pelvis Wo Contrast  Result Date: 10/27/2017 CLINICAL DATA:  Found on the floor. Altered mental status, diarrhea, acute renal failure, severe leukocytosis EXAM: CT ABDOMEN AND PELVIS WITHOUT CONTRAST TECHNIQUE: Multidetector CT imaging of the abdomen and pelvis was performed following the standard protocol without IV contrast. COMPARISON:  05/04/2012 FINDINGS: Lower chest: Left lower lobe atelectasis. Calcific atherosclerotic disease of the aorta. Hepatobiliary: Normal appearance of the liver. 2.7 cm calculus within the dependent portion of the gallbladder. No secondary signs of acute cholecystitis.  Pancreas: Unremarkable. No pancreatic ductal dilatation or surrounding inflammatory changes. Spleen: Normal in size without focal abnormality. Adrenals/Urinary Tract: Adrenal glands are unremarkable. Kidneys are normal, without renal calculi, focal lesion, or hydronephrosis. Bladder is unremarkable. Stomach/Bowel: Fluid-filled small and large bowel loops with upper limits of normal caliber of the small bowels. No transitional point seen. Featureless appearance of the colon usually seen with colitis. Stool retention device in the rectum. Vascular/Lymphatic: Aortic atherosclerosis. No enlarged abdominal or pelvic lymph nodes. Reproductive: Uterus and bilateral adnexa are unremarkable. Other: No abdominal wall hernia or abnormality. No abdominopelvic ascites. Musculoskeletal: No acute osseous findings. Multilevel osteoarthritic changes of the lumbosacral spine. IMPRESSION: 2.7 cm calculus within the dependent portion of the gallbladder. No CT evidence of acute cholecystitis on this motion degraded exam. Pan enterocolitis with evidence of diarrheal state. Calcific atherosclerotic disease and tortuosity of the aorta. Electronically Signed   By: Fidela Salisbury M.D.   On: 10/27/2017 14:43   Dg Chest 2 View  Result Date: 11/07/2017 CLINICAL DATA:  Fall. EXAM: CHEST  2 VIEW COMPARISON:  CT chest dated October 05, 2017. Chest x-ray dated May 16, 2015. FINDINGS: Stable cardiomediastinal silhouette. Normal pulmonary vascularity. Atherosclerotic calcification of the aortic arch. No focal consolidation, pleural effusion, or pneumothorax. No acute osseous abnormality. IMPRESSION: No active cardiopulmonary disease. Electronically Signed   By: Titus Dubin M.D.   On: 10/19/2017 16:25   Dg Pelvis 1-2 Views  Result Date: 11/02/2017 CLINICAL DATA:  Recent fall EXAM: PELVIS - 1-2 VIEW COMPARISON:  None. FINDINGS: Pelvic ring is intact. No acute fracture or dislocation is seen. Degenerative changes of lumbar spine  are noted. No soft tissue abnormality is noted. IMPRESSION: No acute  abnormality seen. Electronically Signed   By: Inez Catalina M.D.   On: 10/29/2017 16:24   Ct Head Wo Contrast  Result Date: 10/31/2017 CLINICAL DATA:  Seizure, new, nontraumatic, >40 yrs Focal neuro deficit, < 6 hrs, stroke suspected Altered level of consciousness (LOC), unexplained. Post code. EXAM: CT HEAD WITHOUT CONTRAST TECHNIQUE: Contiguous axial images were obtained from the base of the skull through the vertex without intravenous contrast. COMPARISON:  Head CT 10/24/2017 FINDINGS: Brain: Mild sulcal effacement and loss of differentiation of gray-white in the high frontal parietal lobes. Smaller ventricles on prior exam. No tonsillar herniation. No evidence of acute ischemia or territorial infarct. The basilar cisterns are patent. No hemorrhage or subdural collection. Vascular: No hyperdense vessel. Skull: No fracture or focal lesion. Sinuses/Orbits: Minimal debris in right ethmoid air cells. Mucosal thickening of the maxillary sinuses. Other: None. IMPRESSION: Findings consistent with mild cerebral edema. No hemorrhage or acute ischemia. These results were called by telephone at the time of interpretation on 10/31/2017 at 6:42 am to Dr. Seward Carol , who verbally acknowledged these results. Electronically Signed   By: Jeb Levering M.D.   On: 10/31/2017 06:40   Ct Head Wo Contrast  Result Date: 10/25/2017 CLINICAL DATA:  Altered mental status and recent fall EXAM: CT HEAD WITHOUT CONTRAST CT CERVICAL SPINE WITHOUT CONTRAST TECHNIQUE: Multidetector CT imaging of the head and cervical spine was performed following the standard protocol without intravenous contrast. Multiplanar CT image reconstructions of the cervical spine were also generated. COMPARISON:  None. FINDINGS: CT HEAD FINDINGS Brain: No evidence of acute infarction, hemorrhage, hydrocephalus, extra-axial collection or mass lesion/mass effect. Vascular: No hyperdense  vessel or unexpected calcification. Skull: Normal. Negative for fracture or focal lesion. Sinuses/Orbits: No acute finding. Other: None. CT CERVICAL SPINE FINDINGS Alignment: Within normal limits. Skull base and vertebrae: 7 cervical segments are well visualized. Vertebral body height is well maintained. Facet hypertrophic changes are noted throughout the cervical spine. No acute fracture or acute facet abnormality is noted. Osteophytic changes are noted most marked at C5-6 and C6-7. Soft tissues and spinal canal: Vascular calcifications are noted. No acute soft tissue abnormality is noted. Upper chest: Visualized upper chest is unremarkable. Other: None IMPRESSION: CT of the head: No acute intracranial abnormality noted. CT of the cervical spine: Multilevel degenerative change without acute abnormality. Electronically Signed   By: Inez Catalina M.D.   On: 10/29/2017 16:21   Ct Cervical Spine Wo Contrast  Result Date: 11/04/2017 CLINICAL DATA:  Altered mental status and recent fall EXAM: CT HEAD WITHOUT CONTRAST CT CERVICAL SPINE WITHOUT CONTRAST TECHNIQUE: Multidetector CT imaging of the head and cervical spine was performed following the standard protocol without intravenous contrast. Multiplanar CT image reconstructions of the cervical spine were also generated. COMPARISON:  None. FINDINGS: CT HEAD FINDINGS Brain: No evidence of acute infarction, hemorrhage, hydrocephalus, extra-axial collection or mass lesion/mass effect. Vascular: No hyperdense vessel or unexpected calcification. Skull: Normal. Negative for fracture or focal lesion. Sinuses/Orbits: No acute finding. Other: None. CT CERVICAL SPINE FINDINGS Alignment: Within normal limits. Skull base and vertebrae: 7 cervical segments are well visualized. Vertebral body height is well maintained. Facet hypertrophic changes are noted throughout the cervical spine. No acute fracture or acute facet abnormality is noted. Osteophytic changes are noted most marked  at C5-6 and C6-7. Soft tissues and spinal canal: Vascular calcifications are noted. No acute soft tissue abnormality is noted. Upper chest: Visualized upper chest is unremarkable. Other: None IMPRESSION: CT of the head: No acute  intracranial abnormality noted. CT of the cervical spine: Multilevel degenerative change without acute abnormality. Electronically Signed   By: Inez Catalina M.D.   On: 10/19/2017 16:21   Dg Chest Port 1 View  Result Date: 10/31/2017 CLINICAL DATA:  Intubated.  Hypotension. EXAM: PORTABLE CHEST 1 VIEW COMPARISON:  10/31/2017 FINDINGS: Cardiomediastinal silhouette is unchanged. An endotracheal tube with tip 2.8 cm above the carina, NG tube with tip overlying the stomach and left IJ central venous catheter with tip overlying the superior cavoatrial junction again noted. . There is no evidence of pneumothorax, airspace disease or consolidation. There may be trace bilateral pleural effusions present. IMPRESSION: Unchanged appearance the chest with support apparatus as described. Question trace pleural effusions. Electronically Signed   By: Margarette Canada M.D.   On: 10/31/2017 13:34   Dg Chest Port 1 View  Result Date: 10/31/2017 CLINICAL DATA:  Endotracheal intubation EXAM: PORTABLE CHEST 1 VIEW COMPARISON:  Chest radiograph 10/31/2017 FINDINGS: Endotracheal tube has been retracted and the tip is now 3.2 cm above the inferior carina. Left IJ approach central venous catheter tip is at the cavoatrial junction. Orogastric tube tip and side-port in the stomach. Otherwise unchanged appearance of the chest. IMPRESSION: Endotracheal tube tip now 3.2 cm above the inferior carina. Left internal jugular vein approach central venous catheter tip at the cavoatrial junction. Electronically Signed   By: Ulyses Jarred M.D.   On: 10/31/2017 05:58   Dg Chest Port 1 View  Result Date: 10/31/2017 CLINICAL DATA:  Endotracheal intubation EXAM: PORTABLE CHEST 1 VIEW COMPARISON:  Chest radiograph 10/30/2017  FINDINGS: Endotracheal tube tip is 1.5 cm above the inferior margin of the carina. Retraction of 3.5 cm would place the tip at the level of the clavicular heads. The nasogastric tube courses beyond the diaphragm. There is left basilar atelectasis but the lungs are otherwise clear. Normal cardiomediastinal contours. IMPRESSION: Endotracheal tube tip 1.5 cm above the inferior carina. Retraction by 3.5 cm recommended. Electronically Signed   By: Ulyses Jarred M.D.   On: 10/31/2017 03:50   Dg Chest Port 1 View  Result Date: 10/30/2017 CLINICAL DATA:  Shortness of breath. Decreased oxygen saturation and tachycardia today. EXAM: PORTABLE CHEST 1 VIEW COMPARISON:  Radiographs 10/22/2017.  CT 10/05/2017 FINDINGS: The cardiomediastinal contours are unchanged with mild aortic tortuosity. Pulmonary vasculature is normal. No consolidation, pleural effusion, or pneumothorax. No acute osseous abnormalities are seen. IMPRESSION: No acute abnormality. Electronically Signed   By: Jeb Levering M.D.   On: 10/30/2017 22:02   Ct Angio Chest Aorta W/cm &/or Wo/cm  Result Date: 10/05/2017 CLINICAL DATA:  Follow-up thoracic aortic aneurysm. EXAM: CT ANGIOGRAPHY CHEST WITH CONTRAST TECHNIQUE: Multidetector CT imaging of the chest was performed using the standard protocol during bolus administration of intravenous contrast. Multiplanar CT image reconstructions and MIPs were obtained to evaluate the vascular anatomy. CONTRAST:  169mL ISOVUE-370 IOPAMIDOL (ISOVUE-370) INJECTION 76% COMPARISON:  05/16/2015; 12/06/2007 FINDINGS: Vascular Findings: Grossly unchanged mild fusiform aneurysmal dilatation of the ascending thoracic aorta was measurements as follows. The thoracic aorta tapers to a normal caliber at the level of the aortic arch. No evidence of thoracic aortic dissection or periaortic stranding on this nongated examination. Conventional configuration of the aortic arch. The branch vessels of the aortic arch are diseased at  their origin, in particular, the left subclavian artery, however not definitely resulting in hemodynamically significant stenosis. The branch vessels of the aortic arch appear patent throughout their imaged course. Moderate amount of mixed calcified and noncalcified slightly irregular  atherosclerotic plaque within a tortuous but normal caliber descending thoracic aorta. Normal heart size. Calcifications within the mitral valve annulus. No pericardial effusion. Although this examination was not tailored for the evaluation the pulmonary arteries, there are no discrete filling defects within the central pulmonary arterial tree to suggest central pulmonary embolism. Enlarged caliber of the main pulmonary artery measuring 36 mm in diameter (axial image 41, series 4) similar to the 05/2015 examination. ------------------------------------------------------------- Thoracic aortic measurements: Sinotubular junction 32 mm as measured in greatest oblique coronal dimension. Proximal ascending aorta 42 mm as measured in greatest oblique axial dimension at the level of the main pulmonary artery and approximately 42 mm in greatest oblique short axis coronal diameter (coronal image 46, series 7)., similar to the 11/2007 examination. Aortic arch aorta 31 mm as measured in greatest oblique sagittal dimension. Proximal descending thoracic aorta 29 mm as measured in greatest oblique axial dimension at the level of the main pulmonary artery. Distal descending thoracic aorta 28 mm as measured in greatest oblique axial dimension at the level of the diaphragmatic hiatus. Review of the MIP images confirms the above findings. ------------------------------------------------------------- Non-Vascular Findings: Mediastinum/Lymph Nodes: No bulky mediastinal, hilar axillary lymphadenopathy. Lungs/Pleura: Minimal dependent subpleural ground-glass atelectasis, right greater than left. No discrete focal airspace opacities. No pleural effusion or  pneumothorax. The central pulmonary airways appear widely patent. No discrete pulmonary nodules. Upper abdomen: Limited early arterial phase evaluation of the upper abdomen demonstrates a punctate (approximately 0.8 cm) hypoattenuating lesion within the left kidney which is incompletely imaged though likely too small to accurately characterize. Musculoskeletal: No acute or aggressive osseous abnormalities. Regional soft tissues appear normal. IMPRESSION: 1. Stable mild uncomplicated fusiform aneurysmal dilatation of the ascending thoracic aorta measuring 42 mm in diameter, similar to the 11/2007 examination. Aortic aneurysm NOS (ICD10-I71.9). 2.  Aortic Atherosclerosis (ICD10-I70.0). 3. Enlarged caliber the main pulmonary artery, nonspecific though could be seen in the setting of pulmonary arterial hypertension. Note, given history of prior pulmonary embolism, while this examination was not tailored for the evaluation of the pulmonary arteries, there no discrete filling defects within the central pulmonary arterial tree to suggest acute or chronic pulmonary embolism. Electronically Signed   By: Sandi Mariscal M.D.   On: 10/05/2017 15:16   US Abdomen Limited Ruq  Result Date: 10/28/2017 CLINICAL DATA:  Gallbladder stone with non acute cholecystitis. EXAM: ULTRASOUND ABDOMEN LIMITED RIGHT UPPER QUADRANT COMPARISON:  CT abdomen and pelvis 10/27/2017 FINDINGS: Gallbladder: A 2.6 cm shadowing stone is present at the neck of the gallbladder. The gallbladder is contracted. Wall is within normal limits at 3.5 mm. There is no sonographic Murphy sign. Common bile duct: Diameter: 4.6 mm, within normal limits Liver: The liver is diffusely echogenic. No focal lesions are present. The contour is smooth. Portal vein is patent on color Doppler imaging with normal direction of blood flow towards the liver. IMPRESSION: 1. 2.6 cm shadowing gallstone without evidence for cholecystitis. 2. Hepatic steatosis. Electronically Signed   By:  San Morelle M.D.   On: 10/28/2017 11:02    Microbiology Recent Results (from the past 240 hour(s))  Blood Cultures (routine x 2)     Status: None   Collection Time: 10/31/2017  2:32 PM  Result Value Ref Range Status   Specimen Description   Final    BLOOD LEFT ANTECUBITAL Performed at [facility], [place] [address] [place], [place] [address]    Special Requests   Final    BOTTLES DRAWN AEROBIC AND ANAEROBIC Blood Culture  adequate volume Performed at [facility] [address] [place], [place] [address]    Culture   Final    NO GROWTH 5 DAYS Performed at [facility], [place] [address] [place], [place] [address]    Report Status 10/31/2017 FINAL  Final  Gastrointestinal Panel by PCR , Stool     Status: None   Collection Time: 10/19/2017  7:00 PM  Result Value Ref Range Status   Campylobacter species NOT DETECTED NOT DETECTED Final   Plesimonas shigelloides NOT DETECTED NOT DETECTED Final   Salmonella species NOT DETECTED NOT DETECTED Final   Yersinia enterocolitica NOT DETECTED NOT DETECTED Final   Vibrio species NOT DETECTED NOT DETECTED Final   Vibrio cholerae NOT DETECTED NOT DETECTED Final   Enteroaggregative E coli (EAEC) NOT DETECTED NOT DETECTED Final   Enteropathogenic E coli (EPEC) NOT DETECTED NOT DETECTED Final   Enterotoxigenic E coli (ETEC) NOT DETECTED NOT DETECTED Final   Shiga like toxin producing E coli (STEC) NOT DETECTED NOT DETECTED Final   Shigella/Enteroinvasive E coli (EIEC) NOT DETECTED NOT DETECTED Final   Cryptosporidium NOT DETECTED NOT DETECTED Final   Cyclospora cayetanensis NOT DETECTED NOT DETECTED Final   Entamoeba histolytica NOT DETECTED NOT DETECTED Final   Giardia lamblia NOT DETECTED NOT DETECTED Final   Adenovirus F40/41 NOT DETECTED NOT DETECTED Final   Astrovirus NOT DETECTED NOT DETECTED Final   Norovirus GI/GII NOT DETECTED NOT DETECTED Final   Rotavirus A NOT DETECTED NOT DETECTED  Final   Sapovirus (I, II, IV, and V) NOT DETECTED NOT DETECTED Final    Comment: Performed at [facility], [place] [place], [place] [address]  C difficile quick scan w PCR reflex     Status: None   Collection Time: 10/25/2017  7:00 PM  Result Value Ref Range Status   C Diff antigen NEGATIVE NEGATIVE Final   C Diff toxin NEGATIVE NEGATIVE Final   C Diff interpretation No C. difficile detected.  Final    Comment: Performed at [facility], [place] [address] [place], [place] [address]  Blood Cultures (routine x 2)     Status: None   Collection Time: 10/27/17 12:50 AM  Result Value Ref Range Status   Specimen Description   Final    BLOOD RIGHT HAND Performed at [employer] [address], [place], [place] [address]    Special Requests   Final    IN PEDIATRIC BOTTLE Blood Culture adequate volume Performed at [place] [address], [place], [place] [address]    Culture   Final    NO GROWTH 5 DAYS Performed at [facility], [place] [address] [place], [place] [address]    Report Status 12-Nov-2017 FINAL  Final  Urine culture     Status: None   Collection Time: 10/27/17  3:02 PM  Result Value Ref Range Status   Specimen Description   Final    URINE, CATHETERIZED Performed at [facility], [place] [address] [place], [place] [address]    Special Requests   Final    NONE Performed at [facility], [place] [address] [place], [place] [address]    Culture   Final    NO GROWTH Performed at [facility], [place] [address] [place],  [address]    Report Status 10/28/2017 FINAL  Final  MRSA PCR Screening     Status: Abnormal   Collection  Time: 10/28/17  9:27 AM  Result Value Ref Range Status   MRSA by PCR POSITIVE (A) NEGATIVE Final    Comment:        The GeneXpert MRSA Assay (FDA approved for NASAL specimens only), is one component of a comprehensive MRSA  colonization surveillance program. It is not intended to diagnose MRSA infection nor to guide or monitor treatment for MRSA infections. RESULT CALLED TO, READ BACK BY AND VERIFIED WITH: GROGAN,B @ [id number] TM [id number] BY POTEAT,S Performed at [facility] [place] [address], [place], [place] [address]     Lab Basic Metabolic Panel: Recent Labs  Lab 10/27/17 0345  10/30/17 0515 10/30/17 1814 10/30/17 1833 10/31/17 0426 10/31/17 1151 11/30/17 0408  NA 145   < > 138 137  --  139 140 136  K 3.4*   < > 3.4* 3.5  --  5.5* 6.2* >7.5*  CL 111   < > 94* 92*  --  96* 94* 91*  CO2 15*   < > 32 31  --  14* 13* <7*  GLUCOSE 111*   < > 99 139*  --  64* 139* 475*  BUN 62*   < > 48* 42*  --  40* 37* 34*  CREATININE 5.07*   < > 3.22* 2.95*  --  3.60* 3.39* 3.85*  CALCIUM 8.0*   < > 7.4* 7.4*  --  6.7* 5.6* 5.6*  MG 1.9  --   --   --  1.0*  --   --  2.4  PHOS 4.2  --   --   --   --   --   --  16.1*   < > = values in this interval not displayed.   Liver Function Tests: Recent Labs  Lab 10/27/17 0345 10/31/17 1151 11/30/17 0408  AST 231* 1,221* >10,000*  ALT 91* 511* 3,794*  ALKPHOS 69 64 700*  BILITOT 1.5* 1.8* 1.0  PROT 6.9 3.3* <3.0*  ALBUMIN 3.0* 1.5* <1.0*   No results for input(s): LIPASE, AMYLASE in the last 168 hours. Recent Labs  Lab 10/31/17 1151  AMMONIA 25   CBC: Recent Labs  Lab 10/27/17 0816 10/28/17 0155  10/31/17 0426 10/31/17 1151 10/31/17 1557 10/31/17 2157 11/30/2017 0408  WBC 23.4* 19.5*   < > 23.1* 41.3* 36.9* 34.4* 26.1*  NEUTROABS 19.5* 16.3*  --   --   --   --   --   --   HGB 16.2* 13.1   < > 5.4* 7.4* 8.2* 6.6* 5.9*  HCT 44.8 36.3   < > 16.4* 21.9* 26.1* 21.0* 19.1*  MCV 95.5 94.5   < > 100.6* 97.3 100.8* 102.4* 102.7*  PLT 174 150   < > 174 84* 75* 66* 40*   < > = values in this interval not displayed.   Cardiac Enzymes: Recent Labs  Lab 10/27/17 0345 10/28/17 0155 10/29/17 0316 10/30/17 0515  10/31/17 0000 10/31/17 0426  10/31/17 1151 10/31/17 1557 10/31/17 2157  CKTOTAL 6,917* 4,627* 1,732* 724*  --   --  453*  --   --   --   TROPONINI  --   --   --   --    < > 0.06* 0.18* 2.37* 10.07* 15.05*   < > = values in this interval not displayed.   Sepsis Labs: Recent Labs  Lab 10/28/17 0140 10/28/17 0155 10/29/17 0316 10/30/17 0515  10/31/17 0426 10/31/17 1151 10/31/17 1557 10/31/17 2157 November 30, 2017 0408  PROCALCITON 6.99  --  3.35 1.87  --  0.87  --   --   --   --   WBC  --  19.5* 13.6* 10.8*   < > 23.1* 41.3* 36.9* 34.4* 26.1*  LATICACIDVEN  --  2.1* 1.2  --   --  15.4* 21.6*  --   --   --    < > = values in this interval not displayed.    Procedures/Operations  none   Simonne Maffucci 11/02/2017, 3:55 PM

## 2017-11-11 NOTE — Progress Notes (Signed)
Due to patient fall at home, medical examiner called to release patient. Patient medically cleared by medical examiner.

## 2017-11-11 NOTE — Procedures (Signed)
Arterial Catheter Insertion Procedure Note ANNIA GOMM [id number] 08-Jul-1956  Procedure: Insertion of Arterial Catheter  Indications: Blood pressure monitoring  Procedure Details Consent: Unable to obtain consent because of emergent medical necessity. Time Out: Verified patient identification, verified procedure, site/side was marked, verified correct patient position, special equipment/implants available, medications/allergies/relevent history reviewed, required imaging and test results available.  Performed  Maximum sterile technique was used including antiseptics, cap and gloves. Skin prep: Chlorhexidine; local anesthetic administered 22 gauge catheter was inserted into right radial artery using the Seldinger technique.  Evaluation Blood flow good; BP tracing good. Complications: No apparent complications.   Winferd Humphrey 11-04-2017

## 2017-11-11 DEATH — deceased

## 2017-11-14 ENCOUNTER — Ambulatory Visit: Payer: Medicare Other | Admitting: Cardiology

## 2017-12-12 ENCOUNTER — Other Ambulatory Visit: Payer: Self-pay | Admitting: Internal Medicine

## 2017-12-12 DIAGNOSIS — F418 Other specified anxiety disorders: Secondary | ICD-10-CM

## 2019-10-05 NOTE — Telephone Encounter (Signed)
error 

## 2020-02-09 NOTE — Progress Notes (Signed)
This encounter was created in error - please disregard.
